# Patient Record
Sex: Female | Born: 1935 | Race: White | Hispanic: No | Marital: Married | Smoking: Former smoker
Health system: Southern US, Community
[De-identification: ages and names within clinical notes are randomized; demographics above are authoritative.]

## PROBLEM LIST (undated history)

## (undated) DIAGNOSIS — K5790 Diverticulosis of intestine, part unspecified, without perforation or abscess without bleeding: Secondary | ICD-10-CM

## (undated) DIAGNOSIS — E785 Hyperlipidemia, unspecified: Secondary | ICD-10-CM

## (undated) DIAGNOSIS — M1990 Unspecified osteoarthritis, unspecified site: Secondary | ICD-10-CM

## (undated) DIAGNOSIS — K2990 Gastroduodenitis, unspecified, without bleeding: Secondary | ICD-10-CM

## (undated) DIAGNOSIS — I4891 Unspecified atrial fibrillation: Secondary | ICD-10-CM

## (undated) DIAGNOSIS — E039 Hypothyroidism, unspecified: Secondary | ICD-10-CM

## (undated) HISTORY — PX: TOTAL KNEE ARTHROPLASTY: SHX125

## (undated) HISTORY — PX: KNEE SURGERY: SHX244

## (undated) HISTORY — DX: Diverticulosis of intestine, part unspecified, without perforation or abscess without bleeding: K57.90

## (undated) HISTORY — DX: Hypothyroidism, unspecified: E03.9

## (undated) HISTORY — DX: Gastroduodenitis, unspecified, without bleeding: K29.90

## (undated) HISTORY — DX: Hyperlipidemia, unspecified: E78.5

## (undated) HISTORY — DX: Unspecified atrial fibrillation: I48.91

## (undated) HISTORY — PX: ROTATOR CUFF REPAIR: SHX139

## (undated) HISTORY — DX: Unspecified osteoarthritis, unspecified site: M19.90

## (undated) HISTORY — PX: INTRAOCULAR LENS IMPLANT, SECONDARY: SHX1842

## (undated) HISTORY — PX: TONSILLECTOMY: SUR1361

---

## 1994-09-14 ENCOUNTER — Encounter: Payer: Self-pay | Admitting: Internal Medicine

## 1995-05-05 ENCOUNTER — Encounter: Payer: Self-pay | Admitting: Internal Medicine

## 1995-05-19 ENCOUNTER — Encounter: Payer: Self-pay | Admitting: Internal Medicine

## 2000-06-09 ENCOUNTER — Encounter: Payer: Self-pay | Admitting: Endocrinology

## 2000-06-09 ENCOUNTER — Encounter: Admission: RE | Admit: 2000-06-09 | Discharge: 2000-06-09 | Payer: Self-pay | Admitting: Endocrinology

## 2001-05-03 ENCOUNTER — Encounter: Admission: RE | Admit: 2001-05-03 | Discharge: 2001-05-03 | Payer: Self-pay | Admitting: Endocrinology

## 2001-05-03 ENCOUNTER — Encounter: Payer: Self-pay | Admitting: Endocrinology

## 2001-06-20 ENCOUNTER — Encounter: Admission: RE | Admit: 2001-06-20 | Discharge: 2001-06-20 | Payer: Self-pay | Admitting: Endocrinology

## 2001-06-20 ENCOUNTER — Encounter: Payer: Self-pay | Admitting: Endocrinology

## 2002-06-21 ENCOUNTER — Encounter: Payer: Self-pay | Admitting: Endocrinology

## 2002-06-21 ENCOUNTER — Encounter: Admission: RE | Admit: 2002-06-21 | Discharge: 2002-06-21 | Payer: Self-pay | Admitting: Endocrinology

## 2003-06-21 ENCOUNTER — Encounter: Admission: RE | Admit: 2003-06-21 | Discharge: 2003-06-21 | Payer: Self-pay | Admitting: Endocrinology

## 2004-07-21 ENCOUNTER — Encounter: Admission: RE | Admit: 2004-07-21 | Discharge: 2004-07-21 | Payer: Self-pay | Admitting: Endocrinology

## 2005-08-16 ENCOUNTER — Encounter: Admission: RE | Admit: 2005-08-16 | Discharge: 2005-08-16 | Payer: Self-pay | Admitting: Endocrinology

## 2005-10-07 ENCOUNTER — Emergency Department (HOSPITAL_COMMUNITY): Admission: EM | Admit: 2005-10-07 | Discharge: 2005-10-08 | Payer: Self-pay | Admitting: Emergency Medicine

## 2005-10-18 ENCOUNTER — Emergency Department (HOSPITAL_COMMUNITY): Admission: EM | Admit: 2005-10-18 | Discharge: 2005-10-18 | Payer: Self-pay | Admitting: Emergency Medicine

## 2006-09-05 ENCOUNTER — Encounter: Admission: RE | Admit: 2006-09-05 | Discharge: 2006-09-05 | Payer: Self-pay | Admitting: Endocrinology

## 2007-10-20 ENCOUNTER — Encounter: Admission: RE | Admit: 2007-10-20 | Discharge: 2007-10-20 | Payer: Self-pay | Admitting: Endocrinology

## 2008-01-23 ENCOUNTER — Inpatient Hospital Stay (HOSPITAL_COMMUNITY): Admission: RE | Admit: 2008-01-23 | Discharge: 2008-01-25 | Payer: Self-pay | Admitting: Orthopedic Surgery

## 2008-09-17 ENCOUNTER — Ambulatory Visit: Payer: Self-pay | Admitting: Ophthalmology

## 2008-09-23 ENCOUNTER — Ambulatory Visit: Payer: Self-pay | Admitting: Ophthalmology

## 2008-10-21 ENCOUNTER — Encounter: Admission: RE | Admit: 2008-10-21 | Discharge: 2008-10-21 | Payer: Self-pay | Admitting: Endocrinology

## 2008-12-11 ENCOUNTER — Encounter: Admission: RE | Admit: 2008-12-11 | Discharge: 2008-12-11 | Payer: Self-pay | Admitting: Endocrinology

## 2009-03-21 ENCOUNTER — Encounter: Admission: RE | Admit: 2009-03-21 | Discharge: 2009-03-21 | Payer: Self-pay | Admitting: Orthopaedic Surgery

## 2009-04-28 ENCOUNTER — Ambulatory Visit: Payer: Self-pay | Admitting: Internal Medicine

## 2009-04-28 DIAGNOSIS — E039 Hypothyroidism, unspecified: Secondary | ICD-10-CM | POA: Insufficient documentation

## 2009-04-28 DIAGNOSIS — I4891 Unspecified atrial fibrillation: Secondary | ICD-10-CM | POA: Insufficient documentation

## 2009-05-15 ENCOUNTER — Encounter: Admission: RE | Admit: 2009-05-15 | Discharge: 2009-05-15 | Payer: Self-pay | Admitting: Endocrinology

## 2009-05-29 ENCOUNTER — Encounter: Payer: Self-pay | Admitting: Internal Medicine

## 2009-07-07 ENCOUNTER — Ambulatory Visit: Payer: Self-pay | Admitting: Internal Medicine

## 2009-10-29 ENCOUNTER — Ambulatory Visit: Payer: Self-pay | Admitting: Internal Medicine

## 2009-10-30 ENCOUNTER — Encounter: Admission: RE | Admit: 2009-10-30 | Discharge: 2009-10-30 | Payer: Self-pay | Admitting: Endocrinology

## 2009-11-05 ENCOUNTER — Ambulatory Visit: Payer: Self-pay | Admitting: Internal Medicine

## 2009-11-06 ENCOUNTER — Encounter: Admission: RE | Admit: 2009-11-06 | Discharge: 2009-11-06 | Payer: Self-pay | Admitting: Endocrinology

## 2009-11-12 ENCOUNTER — Ambulatory Visit: Payer: Self-pay | Admitting: Internal Medicine

## 2009-11-19 ENCOUNTER — Ambulatory Visit: Payer: Self-pay | Admitting: Internal Medicine

## 2009-11-26 ENCOUNTER — Ambulatory Visit: Payer: Self-pay | Admitting: Internal Medicine

## 2009-12-03 ENCOUNTER — Ambulatory Visit: Payer: Self-pay | Admitting: Internal Medicine

## 2009-12-10 ENCOUNTER — Ambulatory Visit: Payer: Self-pay | Admitting: Internal Medicine

## 2009-12-17 ENCOUNTER — Ambulatory Visit: Payer: Self-pay | Admitting: Internal Medicine

## 2009-12-24 ENCOUNTER — Ambulatory Visit: Payer: Self-pay | Admitting: Internal Medicine

## 2009-12-31 ENCOUNTER — Ambulatory Visit: Payer: Self-pay | Admitting: Internal Medicine

## 2010-01-07 ENCOUNTER — Ambulatory Visit: Payer: Self-pay | Admitting: Internal Medicine

## 2010-01-14 ENCOUNTER — Ambulatory Visit: Payer: Self-pay | Admitting: Internal Medicine

## 2010-01-21 ENCOUNTER — Ambulatory Visit: Payer: Self-pay | Admitting: Internal Medicine

## 2010-01-28 ENCOUNTER — Ambulatory Visit: Payer: Self-pay | Admitting: Internal Medicine

## 2010-02-04 ENCOUNTER — Ambulatory Visit: Payer: Self-pay | Admitting: Internal Medicine

## 2010-02-10 ENCOUNTER — Ambulatory Visit: Payer: Self-pay | Admitting: Internal Medicine

## 2010-02-24 ENCOUNTER — Encounter: Admission: RE | Admit: 2010-02-24 | Discharge: 2010-02-24 | Payer: Self-pay | Admitting: Endocrinology

## 2010-03-16 ENCOUNTER — Inpatient Hospital Stay (HOSPITAL_COMMUNITY): Admission: RE | Admit: 2010-03-16 | Discharge: 2010-03-19 | Payer: Self-pay | Admitting: Orthopedic Surgery

## 2010-03-20 ENCOUNTER — Encounter: Payer: Self-pay | Admitting: Internal Medicine

## 2010-03-24 ENCOUNTER — Ambulatory Visit: Payer: Self-pay | Admitting: Internal Medicine

## 2010-07-28 LAB — URINALYSIS, ROUTINE W REFLEX MICROSCOPIC
Nitrite: NEGATIVE
Specific Gravity, Urine: 1.012 (ref 1.005–1.030)
Urine Glucose, Fasting: NEGATIVE mg/dL
pH: 5.5 (ref 5.0–8.0)

## 2010-07-28 LAB — CBC
MCH: 31.6 pg (ref 26.0–34.0)
MCHC: 35.6 g/dL (ref 30.0–36.0)
Platelets: 247 10*3/uL (ref 150–400)
RBC: 4.43 MIL/uL (ref 3.87–5.11)
RDW: 15.1 % (ref 11.5–15.5)
WBC: 6.3 10*3/uL (ref 4.0–10.5)

## 2010-07-28 LAB — BASIC METABOLIC PANEL
Calcium: 10.2 mg/dL (ref 8.4–10.5)
Chloride: 102 mEq/L (ref 96–112)
GFR calc non Af Amer: 46 mL/min — ABNORMAL LOW (ref >60)
Sodium: 140 mEq/L (ref 135–145)

## 2010-07-28 LAB — DIFFERENTIAL
Lymphocytes Relative: 29 % (ref 12–46)
Monocytes Absolute: 0.7 10*3/uL (ref 0.1–1.0)

## 2010-07-28 LAB — APTT: aPTT: 33 seconds (ref 24–37)

## 2010-07-28 LAB — PROTIME-INR: Prothrombin Time: 18.7 seconds — ABNORMAL HIGH (ref 11.6–15.2)

## 2010-07-28 LAB — SURGICAL PCR SCREEN: MRSA, PCR: NEGATIVE

## 2010-07-31 ENCOUNTER — Telehealth: Payer: Self-pay | Admitting: Internal Medicine

## 2010-08-04 ENCOUNTER — Inpatient Hospital Stay (HOSPITAL_COMMUNITY)
Admission: RE | Admit: 2010-08-04 | Discharge: 2010-08-07 | DRG: 467 | Disposition: A | Discharge status: Skilled Nursing Facility | Payer: Medicare Other | Attending: Orthopedic Surgery | Admitting: Orthopedic Surgery

## 2010-08-04 DIAGNOSIS — T84099A Other mechanical complication of unspecified internal joint prosthesis, initial encounter: Principal | ICD-10-CM | POA: Diagnosis present

## 2010-08-04 DIAGNOSIS — Z96659 Presence of unspecified artificial knee joint: Secondary | ICD-10-CM

## 2010-08-04 DIAGNOSIS — D62 Acute posthemorrhagic anemia: Secondary | ICD-10-CM | POA: Diagnosis not present

## 2010-08-04 DIAGNOSIS — Y831 Surgical operation with implant of artificial internal device as the cause of abnormal reaction of the patient, or of later complication, without mention of misadventure at the time of the procedure: Secondary | ICD-10-CM | POA: Diagnosis present

## 2010-08-04 DIAGNOSIS — I4891 Unspecified atrial fibrillation: Secondary | ICD-10-CM | POA: Diagnosis present

## 2010-08-04 DIAGNOSIS — M1710 Unilateral primary osteoarthritis, unspecified knee: Secondary | ICD-10-CM | POA: Diagnosis present

## 2010-08-04 DIAGNOSIS — Z7901 Long term (current) use of anticoagulants: Secondary | ICD-10-CM

## 2010-08-04 LAB — TYPE AND SCREEN
ABO/RH(D): O POS
Antibody Screen: NEGATIVE

## 2010-08-04 LAB — PROTIME-INR
INR: 0.97 (ref 0.00–1.49)
Prothrombin Time: 13.1 s (ref 11.6–15.2)

## 2010-08-04 LAB — APTT: aPTT: 26 s (ref 24–37)

## 2010-08-04 NOTE — Assessment & Plan Note (Signed)
Summary: f97m   Visit Type:  Follow-up Primary Provider:  Therisa Doyne, MD  CC:  no sob, no cp, and swelling in legs.  History of Present Illness: [name] is seen in followup for atrial fibrillation which is permanent.The patient denies SOB, chest pain, edema or palpitations    she comes in today to discuss going on Pradaxa.  She has no history of gastroesophageal reflux or ulcer disease.  Current Problems (verified): 1)  Unspecified Hypothyroidism  (ICD-244.9) 2)  Atrial Fibrillation  (ICD-427.31)  Current Medications (verified): 1)  Crestor 10 Mg Tabs (Rosuvastatin Calcium) .... Take One Tablet By Mouth Daily. 2)  Mobic 7.5 Mg Tabs (Meloxicam) .Marland Kitchen.. 1 By Mouth Once Daily 3)  Atenolol 25 Mg Tabs (Atenolol) .... Take One Tablet By Mouth Daily 4)  Coumadin 5 Mg Tabs (Warfarin Sodium) .Marland Kitchen.. 1 By Mouth Once Daily As Directed By Clinic - Dr. Dagoberto Ligas 5)  Synthroid 75 Mcg Tabs (Levothyroxine Sodium) .Marland Kitchen.. 1 By Mouth Once Daily 6)  Biotin 5000 5 Mg Caps (Biotin) .Marland Kitchen.. 1 By Mouth Once Daily 7)  Fish Oil 1000 Mg Caps (Omega-3 Fatty Acids) .Marland Kitchen.. 1 By Mouth Once Daily 8)  Glucosamine-Chondroitin 750-600 Mg Tabs (Glucosamine-Chondroitin) .... 2 By Mouth Once Daily 9)  Calcium-Vitamin D 250-125 Mg-Unit Tabs (Calcium Carbonate-Vitamin D) .Marland Kitchen.. 1 By Mouth Once Daily 10)  Milk Thistle Xtra  Caps (Milk Thistle-Dand-Fennel-Licor) .Marland Kitchen.. 1 By Mouth Once Daily 11)  One-A-Day Extras Antioxidant  Caps (Multiple Vitamins-Minerals) .Marland Kitchen.. 1 By Mouth Once Daily 12)  Furosemide 40 Mg Tabs (Furosemide) .... Take One Tablet By Mouth Daily. 13)  Potassium Chloride Cr 10 Meq Cr-Caps (Potassium Chloride) .... Take One Tablet By Mouth Two Times A Day  Allergies (verified): 1)  ! Sulfa 2)  ! Pcn 3)  ! Adhesive Tape  Vital Signs:  Patient profile:   77 year old female Height:      68 inches Weight:      120.50 pounds BMI:     18.39 Pulse rate:   64 / minute Pulse rhythm:   regular BP sitting:   118 / 70   (left arm) Cuff size:   regular  Vitals Entered By: Mercer Pod (July 07, 2009 12:24 PM)  Physical Exam  General:  The patient was alert and oriented in no acute distress.Neck veins were flat, carotids were brisk. Lungs were clear. Heart sounds were irregular without murmurs or gallops. Abdomen was soft with active bowel sounds. There is no clubbing cyanosis or edema.    Impression & Recommendations:  Problem # 1:  ATRIAL FIBRILLATION (ICD-427.31) We spent 25-30 minutes discussing the role of Pradaxa as an alternative anticoagulant to Coumadin. She would like to make this change. She is aware of the potential issues related to GI side effects. She knows [name] personally and well and she will call him also for his input. We have given her samples today as well as the co-pay card. She is to stop her Coumadin tonight and begin her Pradaxa on Wednesday. She'll continue on her atenolol. The following medications were removed from the medication list:    Coumadin 5 Mg Tabs (Warfarin sodium) .Marland Kitchen... 1 by mouth once daily as directed by clinic - dr. Dagoberto Ligas Her updated medication list for this problem includes:    Atenolol 25 Mg Tabs (Atenolol) .Marland Kitchen... Take one tablet by mouth daily  Patient Instructions: 1)  Your physician recommends that you schedule a follow-up appointment in: 3 months 2)  Your physician has  recommended you make the following change in your medication: stop coumadin today, start pradaxa on Wed evening Prescriptions: PRADAXA 150 MG 1 tab by mouth twice daily  #60 x 11   Entered by:   Charlena Cross, RN, BSN   Authorized by:   Nathen May, MD, [facility]   Signed by:   Nathen May, MD, [facility] on 07/07/2009   Method used:   Historical   RxID:   [id number]

## 2010-08-04 NOTE — Letter (Signed)
Summary: [facility] Face Sheet  [facility] Face Sheet   Imported By: Beau Fanny 03/25/2010 11:06:19  _____________________________________________________________________  External Attachment:    Type:   Image     Comment:   External Document

## 2010-08-05 LAB — BASIC METABOLIC PANEL
CO2: 29 mEq/L (ref 19–32)
Calcium: 8.8 mg/dL (ref 8.4–10.5)
Chloride: 101 mEq/L (ref 96–112)
GFR calc Af Amer: >60 mL/min (ref >60)
Sodium: 137 mEq/L (ref 135–145)

## 2010-08-05 LAB — CBC
HCT: 29.2 % — ABNORMAL LOW (ref 36.0–46.0)
MCV: 91.5 fL (ref 78.0–100.0)
RBC: 3.19 MIL/uL — ABNORMAL LOW (ref 3.87–5.11)
WBC: 5.9 10*3/uL (ref 4.0–10.5)

## 2010-08-05 LAB — PROTIME-INR: INR: 0.99 (ref 0.00–1.49)

## 2010-08-06 LAB — BASIC METABOLIC PANEL
BUN: 14 mg/dL (ref 6–23)
CO2: 27 mEq/L (ref 19–32)
Chloride: 100 mEq/L (ref 96–112)
Creatinine, Ser: 0.92 mg/dL (ref 0.4–1.2)

## 2010-08-06 LAB — CBC
Hemoglobin: 8.7 g/dL — ABNORMAL LOW (ref 12.0–15.0)
MCH: 30.4 pg (ref 26.0–34.0)
RBC: 2.86 MIL/uL — ABNORMAL LOW (ref 3.87–5.11)
WBC: 4.5 10*3/uL (ref 4.0–10.5)

## 2010-08-06 LAB — URINALYSIS, ROUTINE W REFLEX MICROSCOPIC
Hgb urine dipstick: NEGATIVE
Ketones, ur: NEGATIVE mg/dL
Protein, ur: NEGATIVE mg/dL
Urine Glucose, Fasting: NEGATIVE mg/dL

## 2010-08-06 LAB — PROTIME-INR: Prothrombin Time: 15.5 seconds — ABNORMAL HIGH (ref 11.6–15.2)

## 2010-08-06 NOTE — Progress Notes (Signed)
Summary: pt wants you as her doctor  Phone Note Call from Patient Call back at Home Phone [phone]   Caller: Patient Call For: [name] MD Summary of Call: You were seeing pt while she was in rehab at  [facility].  Her doctor, Dr. Mariam Dollar is retiring and she is asking if you will take her on as a regular pt here.  She will be having a knee replacement next week and will then go back to rehab for awhile. Initial call taken by: Lowella Petties CMA, AAMA,  July 31, 2010 9:46 AM  Follow-up for Phone Call        that is fine I have seen her so technically she is not even a new patient  Okay to set up appt Follow-up by: [name] MD,  July 31, 2010 10:08 AM  Additional Follow-up for Phone Call Additional follow up Details #1::        Advised pt, she will come by office to sign a release form for [place] to send to her current doctor's office. Additional Follow-up by: Lowella Petties CMA, AAMA,  July 31, 2010 10:31 AM

## 2010-08-06 NOTE — Op Note (Signed)
[name], [name] NO.:  [id number]  MEDICAL RECORD NO.:  [id number]          PATIENT TYPE:  INP  LOCATION:  1618                         FACILITY:  [facility]  PHYSICIAN:  Madlyn Frankel. Charlann Boxer, M.D.  DATE OF BIRTH:  01-Jan-1927  DATE OF PROCEDURE:  08/04/2010 DATE OF DISCHARGE:                              OPERATIVE REPORT   PREOPERATIVE DIAGNOSIS:  History of a right partial lateral compartmental knee replacement with ultimate failure due to progression of degenerative change in the patellofemoral compartment predominantly.  POSTOPERATIVE DIAGNOSIS:  History of a right partial lateral compartmental knee replacement with ultimate failure due to progression of degenerative change in the patellofemoral compartment predominantly.  PROCEDURE:  Revision of the right partial knee replaced to a right total knee replacement.  COMPONENTS USED:  DePuy knee system, size 2.5 posterior stabilized femur, 3 tibial tray rotating platform and a size 10-mm insert posterior stabilized rotating platform with a 38 patellar button.  SURGEON:  Madlyn Frankel. Charlann Boxer, M.D.  ASSISTANT:  Nelia Shi. Webb Silversmith, RN.  ANESTHESIA:  Preoperative regional femoral nerve block plus a general anesthetic.  SPECIMENS:  None.  COMPLICATIONS:  None.  DRAINS:  One Hemovac.  TOURNIQUET TIME:  37 minutes at 250 mmHg.  BLOOD LOSS:  Minimal.  INDICATIONS FOR PROCEDURE:  [name] is an 77 year old patient of mine and she had been seen and evaluated for some time in the office. In 2009, she initially underwent a right partial lateral compartmental arthroplasty due to predominant valgus and degenerative changes.  She had done well with this for a while, then she presented to office at about 2 and 2-1/2 years out complaining of progressive degenerative changes.  She had had undergone a left total knee replacement, done very well with this.  Based on the results that she had in her left knee, she at  this point was ready to proceed with a right total knee revision.  We discussed the risks and benefits, the need for the surgery, the postoperative course and expectations in review of what she had with her left knee.  Consent was obtained for the benefit of pain relief.  PROCEDURE IN DETAIL:  The patient was brought to the operative room theater.  Once adequate anesthesia, preoperative antibiotics, Ancef administered, she was positioned supine with the right thigh tourniquet placed.  The right lower extremity was then prepped and draped in a sterile fashion with the right leg in the [facility] leg holder.  A time-out was performed identifying the patient, planned procedure, and the extremity.  Leg was exsanguinated, tourniquet elevated to 250 mmHg.  A midline incision was made followed by median arthrotomy.  Following initial exposure and debridement, attention was first directed to patella.  Precut measurement was approximately 22 to 23 mm.  I resected down to 13 to 14 mm used a 38 patellar button to restore height and cover the cut surface.  Lug holes were drilled and a metal shim placed to protect the cut surface from retractors and saw blades.  Attention was now directed to femur.  The lateral compartment arthroplasty was noted be stable without evidence of any  loosening.  The patellofemoral joint had degenerative changes, worse on the lateral facet of the patella predominantly.  There was evidence of eburnated bone in the trochlear region.  Medial compartment remained intact with intact meniscus.  Femoral canal was opened and drilled, irrigated to try to prevent fat emboli.  An intramedullary rod was passed at 5 degrees of valgus.  I measured resection distally of 10 mm.  I initially resected the medial distal femur and then anterior part of the distal femur laterally and then used an osteotome and wedged off the femoral component.  This was removed with just a little bit of bone  loss around the central peg.  At this point, I finished up this lateral cut, freshening up with just a little bit of the cavitary defect in this lateral side distally.  The tibia was then subluxated anteriorly using extramedullary guide.  I chose to resect 4 mm of bone off the proximal medial tibia.  This cut seem to undermine the previously placed tibial tray other than the keeled portion of it.  I ran an oscillating saw on the medial portion of the knee and as far to the lateral components I could and then used an osteotome at this point and completed the cut and was able to elevate the component and keeled segment with minimal loss of bone, again it on the proximal lateral tibia.  I then made sure that the cut surface was cleaned and I confirmed that the cut was perpendicular in the coronal plane.  I then checked the extension gap and made certain a 10-mm block was going to be stable and extension was stable in medial and lateral collateral ligaments.  Once these 2 factors were done, I sized the femur to be a size 2.5 in an anterior-posterior direction.  The rotation block was pinned into position with anterior referenced off the proximal tibial cut.  The 4 in 1 cutting block was then placed and anterior-posterior chamfer cuts were then made without difficulty or notching.  The final box cut was made off the lateral aspect of distal femur.  At this point, the tibia was subluxated anteriorly.  The size 3 tibia tray fit best on the cut surface.  It was pinned into position, drilled and keeled punch.  A trial reduction now carried out with 2.5 femur, 3 tibia and a 10 mm insert.  The knee came to full extension.  The patella tracked through the trochlea without application of pressure.  At this point, trial components were removed from the synovial capsule junction.  The knee was injected with 0.25% Marcaine with epinephrine and 1 cc of Toradol, total 61 cc.  The knee was irrigated  with normal saline solution, pulse lavaged.  Final components were opened including the polyethylene insert.  Cement was mixed.  Final components were cemented on clean and dried cut surface and prepared cut surfaces of bone.  The knee was brought to extension with the 10 mm trial insert. Extruded cement was removed.  Once I was satisfied there was no remaining cement throughout the knee, the knee was irrigated the final 10-mm insert was inserted.  The knee was reirrigated with normal saline solution.  I placed a medium Hemovac drain.  We reapproximated extensor mechanism using #1 Vicryl with the knee in flexion.  The remaining wound was closed with 2-0 Vicryl and running 4-0 Monocryl.  Her skin is extremely thin and I was still able to get a 4-0 Monocryl in place and  Dermabond this.  She did have a couple of small skin tears at the removal of the sticky dressings.  This was all dermabonded together with Aquacel dressing. Care will need to be taken to remove this with use of mineral oil to elevate the edges.  We may very well have her come to the office to do this.  She was then placed into an Ace wrap, brought to recovery room in stable condition tolerating the procedure well.     Madlyn Frankel Charlann Boxer, M.D.     MDO/MEDQ  D:  08/04/2010  T:  08/05/2010  Job:  [id number]  Electronically Signed by Durene Romans M.D. on 08/06/2010 11:51:19 AM

## 2010-08-07 LAB — PROTIME-INR: INR: 1.37 (ref 0.00–1.49)

## 2010-08-08 LAB — URINE CULTURE
Colony Count: 80000
Culture  Setup Time: 201202022003
Special Requests: NEGATIVE

## 2010-08-11 DIAGNOSIS — I4891 Unspecified atrial fibrillation: Secondary | ICD-10-CM

## 2010-08-11 DIAGNOSIS — I998 Other disorder of circulatory system: Secondary | ICD-10-CM

## 2010-08-11 DIAGNOSIS — E039 Hypothyroidism, unspecified: Secondary | ICD-10-CM

## 2010-08-11 DIAGNOSIS — M1710 Unilateral primary osteoarthritis, unspecified knee: Secondary | ICD-10-CM

## 2010-08-16 NOTE — H&P (Signed)
  [name], [name]               ACCOUNT NO.:  [id number]  MEDICAL RECORD NO.:  [id number]          PATIENT TYPE:  INP  LOCATION:  NA                           FACILITY:  [facility]  PHYSICIAN:  Madlyn Frankel. Charlann Boxer, M.D.  DATE OF BIRTH:  [birth year]  DATE OF ADMISSION: DATE OF DISCHARGE:                             HISTORY & PHYSICAL   ADMISSION DIAGNOSIS:  Right knee arthroplasty failure.  BRIEF HISTORY:  This is a patient who has been followed here for sometime that underwent a left knee arthroplasty in September 2011, did well with that.  She has had a right knee that has become loosened and bothersome and she will have that revised on August 04, 2010.  PAST MEDICAL HISTORY:  She has upper and lower partial dentures.  She does have a history of Afib, generalized osteoarthritis, and some dermatologic problems with frail skin.  MEDICATION LIST: 1. Meloxicam 15 mg a day. 2. Atenolol 25 mg a day. 3. Synthroid 75 mcg a day. 4. Lasix 40 a day. 5. Crestor 10 a day. 6. Coumadin 6 a day. 7. Glucosamine 1500 mg a day. 8. Chondroitin 1200 mg a day, 9. Calcium and vitamin D daily. 10.Fish oil daily. 11.Multivitamins daily. 12.Biotin. 13.Vitamin B complex B12 daily.  ALLERGIES:  The patient has medication allergies to SULFA DRUGS, PENICILLIN, PRADAXA, AND SOME ADHESIVES OF DIFFERENT KINDS.  SOCIAL HISTORY:  She is married.  She is a retired [employer] fund[employer].  She drinks alcohol socially.  She has no history of illicit drugs.  She has distant history of tobacco use.  FAMILY HISTORY:  Her father died at 30 of pancreatic cancer.  Mother lived to 62 with Parkinson's.  She has no children.  REVIEW OF SYSTEMS:  Notable for those difficulties described in history of present illness and past medical history.  Her review of systems sheet is otherwise unremarkable.  PHYSICAL EXAMINATION:  VITAL SIGNS:  The patient is 5 feet 8 ounces, 125 pounds.  Blood pressure today was 130/80, respirations 20,  and pulse is 80. GENERAL:  Health is fair. HEENT:  She does wear glasses with upper and lower partials. NECK:  Within normal limits. CHEST:  Clear to auscultation bilaterally. HEART:  Has S1-S2.  No murmurs, rubs, or gallops. ABDOMEN:  Soft and nondistended. GU:  Unremarkable. EXTREMITIES:  Exam shows osteoarthritis and failed right knee. DERMATOLOGIC:  She has frail skin. NEUROLOGIC:  She is intact.  LABORATORY DATA:  Her labs, EKG, and chest x-ray are pending from [facility].  IMPRESSION:  Right knee failure, loosening of components.  PLAN:  She will be revised total right knee with Dr. Charlann Boxer on August 04, 2010.  Her discharge medications including Robaxin, iron, MiraLax, Colace, and Xarelto were given to her today.  Her pain medicines will be given to her at discharge.     Jacqueline L. Webb Silversmith, RN   ______________________________ Madlyn Frankel Charlann Boxer, M.D.    RLW/MEDQ  D:  07/30/2010  T:  07/30/2010  Job:  [id number]  Electronically Signed by Lauree Chandler NP-C on 08/12/2010 09:43:21 AM Electronically Signed by Durene Romans M.D. on 08/16/2010 09:15:35 AM

## 2010-08-16 NOTE — Discharge Summary (Signed)
  NAMEBAILIE, [name]  MEDICAL RECORD NO.:  [name]           PATIENT TYPE:  I  LOCATION:  1618                         FACILITY:  [facility]  PHYSICIAN:  Jacqueline Frankel. Charlann [name], M.D.  DATE OF BIRTH:  April 07, 1927  DATE OF ADMISSION:  08/04/2010 DATE OF DISCHARGE:  08/07/2010                              DISCHARGE SUMMARY   ADMITTING DIAGNOSIS:  Right knee arthroplasty failure.  BRIEF HISTORY:  This patient has been followed for sometime, underwent a left knee arthroplasty in September and did well.  She has had her right knee replaced with a lateral partial knee replacement with progression of  arthritis that had become bothersome and had that revised on the 31st.  PAST MEDICAL HISTORY:  Significant for history of AFib.  She does have dentures, generalized osteoarthritis, dermatologic problems with frail skin.  HOSPITAL COURSE:  She was admitted through same-day surgery on January 31, taken to the OR, and underwent the revision of the right knee without any difficulties.  She was taken to PACU for recovery and brought to [facility] for further recovery and rehabilitation.  Since that time, she has advanced her diet to regular.  She has been up with physical therapy and done well with that.  She will be discharged to SNF for further rehabilitation as of today.  She will follow up with Dr. Charlann [name] in 2 weeks.  Her discharge condition is good.  Her labs have been stable.  This morning, her INR was 1.37 and her hemoglobin was 8.5.  She is afebrile.  Vital signs are stable.  DISCHARGE MEDICATIONS: 1. Acetaminophen 325 mg every 4 hours as needed. 2. Colace 100 mg twice daily. 3. Ferrous sulfate 325 mg 3 times a day. 4. Robaxin 500 mg every 6 hours as needed. 5. Oxycodone 15 mg q.4-6 h. p.r.n. 6. MiraLax 17 g a day. 7. Atenolol 25 mg every morning. 8. Biotin 5000 mg every morning. 9. Calcium and vitamin D daily. 10.Coumadin 6 mg every evening according to  her INR. 11.Fish oil daily. 12.Furosemide 40 mg a day. 13.Glucosamine/chondroitin daily. 14.Meloxicam 15 mg a day. 15.__________. 16.Multivitamins daily.     Russell L. Webb Silversmith, RN   ______________________________ [name], M.D.    RLW/MEDQ  D:  08/07/2010  T:  08/07/2010  Job:  [id number]  Electronically Signed by Lauree Chandler NP-C on 08/12/2010 09:43:25 AM Electronically Signed by Durene Romans M.D. on 08/16/2010 09:15:29 AM

## 2010-08-31 DIAGNOSIS — M130 Polyarthritis, unspecified: Secondary | ICD-10-CM

## 2010-08-31 DIAGNOSIS — I4891 Unspecified atrial fibrillation: Secondary | ICD-10-CM

## 2010-08-31 DIAGNOSIS — E039 Hypothyroidism, unspecified: Secondary | ICD-10-CM

## 2010-08-31 DIAGNOSIS — I872 Venous insufficiency (chronic) (peripheral): Secondary | ICD-10-CM

## 2010-09-03 ENCOUNTER — Encounter: Payer: Self-pay | Admitting: Internal Medicine

## 2010-09-10 ENCOUNTER — Telehealth (INDEPENDENT_AMBULATORY_CARE_PROVIDER_SITE_OTHER): Payer: Self-pay | Admitting: *Deleted

## 2010-09-10 ENCOUNTER — Encounter: Payer: Self-pay | Admitting: Family Medicine

## 2010-09-10 ENCOUNTER — Ambulatory Visit (INDEPENDENT_AMBULATORY_CARE_PROVIDER_SITE_OTHER): Payer: Medicare Other

## 2010-09-10 DIAGNOSIS — Z5181 Encounter for therapeutic drug level monitoring: Secondary | ICD-10-CM

## 2010-09-10 DIAGNOSIS — E039 Hypothyroidism, unspecified: Secondary | ICD-10-CM

## 2010-09-10 DIAGNOSIS — I4891 Unspecified atrial fibrillation: Secondary | ICD-10-CM

## 2010-09-10 NOTE — Miscellaneous (Signed)
Summary: Medication update  Clinical Lists Changes  Medications: Added new medication of TYLENOL 325 MG TABS (ACETAMINOPHEN) take 2 by mouth every 4 hours as needed for pain or fever Added new medication of MILK OF MAGNESIA 7.75 % SUSP (MAGNESIUM HYDROXIDE) take 30cc by mouth two times a day as needed constipation Added new medication of COUMADIN 5 MG TABS (WARFARIN SODIUM) as directed Added new medication of COUMADIN 6 MG TABS (WARFARIN SODIUM) as directed Added new medication of VITAMIN B-12 1000 MCG TABS (CYANOCOBALAMIN) take 1 by mouth each morning Added new medication of MOBIC 15 MG TABS (MELOXICAM) take 1 by mouth once daily for pain Removed medication of MOBIC 7.5 MG TABS (MELOXICAM) 1 by mouth once daily Changed medication from GLUCOSAMINE-CHONDROITIN 750-600 MG TABS (GLUCOSAMINE-CHONDROITIN) 2 by mouth once daily to GLUCOSAMINE-CHONDROITIN 750-600 MG TABS (GLUCOSAMINE-CHONDROITIN) take 3 by mouth once daily in the morning Added new medication of FISH OIL 1200 MG CAPS (OMEGA-3 FATTY ACIDS) take 2 by mouth once daily Removed medication of FISH OIL 1000 MG CAPS (OMEGA-3 FATTY ACIDS) 1 by mouth once daily Added new medication of COLACE 100 MG CAPS (DOCUSATE SODIUM) take 1 by mouth two times a day Added new medication of B-100 COMPLEX  TABS (VITAMINS-LIPOTROPICS) take 1 by mouth at bedtime Added new medication of MULTIVITAMINS  TABS (MULTIPLE VITAMIN) take 1 by mouth once daily at bedtime Added new medication of CALCIUM CARBONATE-VITAMIN D 600-400 MG-UNIT TABS (CALCIUM CARBONATE-VITAMIN D) take 2 by mouth once daily at bedtime Added new medication of MIRALAX  POWD (POLYETHYLENE GLYCOL 3350) mix 17gm in 8oz of water take by mouth once daily as needed constipation Added new medication of HYDROCODONE-ACETAMINOPHEN 10-325 MG TABS (HYDROCODONE-ACETAMINOPHEN) take 1-2 by mouth every 4 hours as needed Removed medication of CRESTOR 10 MG TABS (ROSUVASTATIN CALCIUM) Take one tablet by mouth  daily. Added new medication of ROBAXIN 500 MG TABS (METHOCARBAMOL) take 1 by mouth every 6 hours as needed for muscle spasms Observations: Added new observation of FLU VAX: Historical (04/08/2010 10:28)      Influenza Immunization History:    Influenza # 1:  Historical (04/08/2010)

## 2010-09-10 NOTE — Letter (Signed)
Summary: [facility] Records   Imported By: Beau Fanny 09/03/2010 15:10:41  _____________________________________________________________________  External Attachment:    Type:   Image     Comment:   External Document

## 2010-09-11 ENCOUNTER — Encounter: Payer: Self-pay | Admitting: Nurse Practitioner

## 2010-09-11 ENCOUNTER — Encounter: Payer: Self-pay | Admitting: Cardiothoracic Surgery

## 2010-09-15 NOTE — Progress Notes (Signed)
----   Converted from flag ---- ---- 09/02/2010 7:54 AM, [name] MD wrote: [name]  She had protime Monday before leaving rehab Was 33.5 with INR of 3.29 so you have her most recent baseline She is coming in next week  Thanks Rich ------------------------------

## 2010-09-15 NOTE — Medication Information (Signed)
Summary: PROTIME PER DR LETVAK/CLE  Anticoagulant Therapy  PCP: Therisa Doyne, MD Indication 1: Atrial fibrillation PT 34.9 INR RANGE 2.0-3.0           Allergies: 1)  ! Sulfa 2)  ! Pcn 3)  ! Adhesive Tape  Anticoagulation Management History:      Positive risk factors for bleeding include an age of 56 years or older.  The bleeding index is 'intermediate risk'.  Positive CHADS2 values include Age > 75 years old.  Today's INR is 2.9.  Prothrombin time is 34.9.    Anticoagulation Management Assessment/Plan:      The patient's current anticoagulation dose is Coumadin 5 mg tabs: as directed, Coumadin 6 mg tabs: as directed.  The next INR is due 2 weeks.        Laboratory Results   Blood Tests   Date/Time Recieved: September 10, 2010 11:04 AM Date/Time Reported: September 10, 2010 11:04 AM  PT: 34.9 s   (Normal Range: 10.6-13.4)  INR: 2.9   (Normal Range: 0.88-1.12   Therap INR: 2.0-3.5)      ANTICOAGULATION RECORD  NEW REGIMEN & LAB RESULTS Anticoag. Dx: Atrial fibrillation Current INR Goal Range: 2.0-3.0 Current INR: 2.9 Current Coumadin Dose(mg): 6 mg dailly, 5 mg M/W/F Regimen: 6 mg daily, 5 mg M/W/F  Provider: letvak      Repeat testing in: 2 weeks MEDICATIONS ATENOLOL 25 MG TABS (ATENOLOL) Take one tablet by mouth daily SYNTHROID 75 MCG TABS (LEVOTHYROXINE SODIUM) 1 by mouth once daily BIOTIN 5000 5 MG CAPS (BIOTIN) 1 by mouth once daily GLUCOSAMINE-CHONDROITIN 750-600 MG TABS (GLUCOSAMINE-CHONDROITIN) take 3 by mouth once daily in the morning CALCIUM-VITAMIN D 250-125 MG-UNIT TABS (CALCIUM CARBONATE-VITAMIN D) 1 by mouth once daily MILK THISTLE XTRA  CAPS (MILK THISTLE-DAND-FENNEL-LICOR) 1 by mouth once daily ONE-A-DAY EXTRAS ANTIOXIDANT  CAPS (MULTIPLE VITAMINS-MINERALS) 1 by mouth once daily FUROSEMIDE 40 MG TABS (FUROSEMIDE) Take one tablet by mouth daily. POTASSIUM CHLORIDE CR 10 MEQ CR-CAPS (POTASSIUM CHLORIDE) Take one tablet by mouth two times a day *  PRADAXA 150 MG 1 tab by mouth twice daily TYLENOL 325 MG TABS (ACETAMINOPHEN) take 2 by mouth every 4 hours as needed for pain or fever MILK OF MAGNESIA 7.75 % SUSP (MAGNESIUM HYDROXIDE) take 30cc by mouth two times a day as needed constipation COUMADIN 5 MG TABS (WARFARIN SODIUM) as directed COUMADIN 6 MG TABS (WARFARIN SODIUM) as directed VITAMIN B-12 1000 MCG TABS (CYANOCOBALAMIN) take 1 by mouth each morning MOBIC 15 MG TABS (MELOXICAM) take 1 by mouth once daily for pain FISH OIL 1200 MG CAPS (OMEGA-3 FATTY ACIDS) take 2 by mouth once daily COLACE 100 MG CAPS (DOCUSATE SODIUM) take 1 by mouth two times a day B-100 COMPLEX  TABS (VITAMINS-LIPOTROPICS) take 1 by mouth at bedtime MULTIVITAMINS  TABS (MULTIPLE VITAMIN) take 1 by mouth once daily at bedtime CALCIUM CARBONATE-VITAMIN D 600-400 MG-UNIT TABS (CALCIUM CARBONATE-VITAMIN D) take 2 by mouth once daily at bedtime MIRALAX  POWD (POLYETHYLENE GLYCOL 3350) mix 17gm in 8oz of water take by mouth once daily as needed constipation HYDROCODONE-ACETAMINOPHEN 10-325 MG TABS (HYDROCODONE-ACETAMINOPHEN) take 1-2 by mouth every 4 hours as needed ROBAXIN 500 MG TABS (METHOCARBAMOL) take 1 by mouth every 6 hours as needed for muscle spasms  Dose has been reviewed with patient or caretaker during this visit.  Reviewed by: Celso Sickle  Anticoagulation Visit Questionnaire      Coumadin dose missed/changed:  No      Abnormal Bleeding Symptoms:  No Any diet changes including alcohol intake, vegetables or greens since the last visit:  No Any illnesses or hospitalizations since the last visit:  No Any signs of clotting since the last visit (including chest discomfort, dizziness, shortness of breath, arm tingling, slurred speech, swelling or redness in leg):  No

## 2010-09-17 LAB — APTT: aPTT: 33 seconds (ref 24–37)

## 2010-09-17 LAB — URINALYSIS, ROUTINE W REFLEX MICROSCOPIC
Ketones, ur: NEGATIVE mg/dL
Nitrite: NEGATIVE
Specific Gravity, Urine: 1.012 (ref 1.005–1.030)
pH: 5.5 (ref 5.0–8.0)

## 2010-09-17 LAB — BASIC METABOLIC PANEL
BUN: 38 mg/dL — ABNORMAL HIGH (ref 6–23)
CO2: 26 mEq/L (ref 19–32)
CO2: 29 mEq/L (ref 19–32)
CO2: 29 mEq/L (ref 19–32)
Calcium: 8.4 mg/dL (ref 8.4–10.5)
Calcium: 9.7 mg/dL (ref 8.4–10.5)
Chloride: 108 mEq/L (ref 96–112)
Chloride: 104 mEq/L (ref 96–112)
GFR calc Af Amer: 57 mL/min — ABNORMAL LOW (ref >60)
GFR calc Af Amer: 50 mL/min — ABNORMAL LOW (ref >60)
GFR calc Af Amer: >60 mL/min (ref >60)
GFR calc non Af Amer: 41 mL/min — ABNORMAL LOW (ref >60)
GFR calc non Af Amer: 42 mL/min — ABNORMAL LOW (ref >60)
Glucose, Bld: 107 mg/dL — ABNORMAL HIGH (ref 70–99)
Potassium: 4.9 mEq/L (ref 3.5–5.1)
Potassium: 5.1 mEq/L (ref 3.5–5.1)
Sodium: 136 mEq/L (ref 135–145)
Sodium: 135 mEq/L (ref 135–145)
Sodium: 136 mEq/L (ref 135–145)

## 2010-09-17 LAB — PROTIME-INR
INR: 1.36 (ref 0.00–1.49)
INR: 1.79 — ABNORMAL HIGH (ref 0.00–1.49)
Prothrombin Time: 13.8 seconds (ref 11.6–15.2)
Prothrombin Time: 15.4 seconds — ABNORMAL HIGH (ref 11.6–15.2)
Prothrombin Time: 21 seconds — ABNORMAL HIGH (ref 11.6–15.2)

## 2010-09-17 LAB — CBC
HCT: 38.9 % (ref 36.0–46.0)
Hemoglobin: 9.8 g/dL — ABNORMAL LOW (ref 12.0–15.0)
MCH: 32.8 pg (ref 26.0–34.0)
MCV: 94.1 fL (ref 78.0–100.0)
MCV: 94.5 fL (ref 78.0–100.0)
Platelets: 126 10*3/uL — ABNORMAL LOW (ref 150–400)
Platelets: 189 10*3/uL (ref 150–400)
RBC: 2.98 MIL/uL — ABNORMAL LOW (ref 3.87–5.11)
RBC: 3 MIL/uL — ABNORMAL LOW (ref 3.87–5.11)
RDW: 14.7 % (ref 11.5–15.5)
RDW: 15.6 % — ABNORMAL HIGH (ref 11.5–15.5)
WBC: 3 10*3/uL — ABNORMAL LOW (ref 4.0–10.5)
WBC: 4.4 10*3/uL (ref 4.0–10.5)
WBC: 6.7 10*3/uL (ref 4.0–10.5)

## 2010-09-17 LAB — DIFFERENTIAL
Basophils Absolute: 0 10*3/uL (ref 0.0–0.1)
Lymphocytes Relative: 24 % (ref 12–46)
Neutro Abs: 4.4 10*3/uL (ref 1.7–7.7)
Neutrophils Relative %: 66 % (ref 43–77)

## 2010-09-17 LAB — SURGICAL PCR SCREEN: Staphylococcus aureus: NEGATIVE

## 2010-09-23 ENCOUNTER — Other Ambulatory Visit: Payer: Self-pay | Admitting: Internal Medicine

## 2010-09-23 ENCOUNTER — Telehealth: Payer: Self-pay | Admitting: Internal Medicine

## 2010-09-23 MED ORDER — ATENOLOL 25 MG PO TABS: 25.0000 mg | ORAL_TABLET | Freq: Every day | ORAL | Status: DC

## 2010-09-23 MED ORDER — WARFARIN SODIUM 6 MG PO TABS: 6.0000 mg | ORAL_TABLET | ORAL | Status: DC

## 2010-09-23 NOTE — Telephone Encounter (Signed)
[name],can you handle this?

## 2010-09-23 NOTE — Telephone Encounter (Signed)
rx sent to pharmacy, left message on patient cell phone that rx was called in to pharmacy.

## 2010-09-23 NOTE — Telephone Encounter (Signed)
Addended by: Mervin Hack on: 09/23/2010 04:26 PM   Modules accepted: Orders

## 2010-09-23 NOTE — Telephone Encounter (Signed)
Okay to change to generic Let her know that almost all my patients use the generic I would recommend checking protime again within 2 weeks of changing over

## 2010-09-24 ENCOUNTER — Other Ambulatory Visit: Payer: Self-pay | Admitting: *Deleted

## 2010-09-24 ENCOUNTER — Ambulatory Visit (INDEPENDENT_AMBULATORY_CARE_PROVIDER_SITE_OTHER): Payer: Medicare Other | Admitting: Internal Medicine

## 2010-09-24 ENCOUNTER — Telehealth (INDEPENDENT_AMBULATORY_CARE_PROVIDER_SITE_OTHER): Payer: Medicare Other | Admitting: Radiology

## 2010-09-24 DIAGNOSIS — I4891 Unspecified atrial fibrillation: Secondary | ICD-10-CM

## 2010-09-24 DIAGNOSIS — Z5181 Encounter for therapeutic drug level monitoring: Secondary | ICD-10-CM

## 2010-09-24 DIAGNOSIS — Z7901 Long term (current) use of anticoagulants: Secondary | ICD-10-CM

## 2010-09-24 LAB — POCT INR: INR: 1.3

## 2010-09-24 MED ORDER — ATENOLOL 25 MG PO TABS: 25.0000 mg | ORAL_TABLET | Freq: Every day | ORAL | Status: DC

## 2010-09-24 NOTE — Patient Instructions (Signed)
6 mg qd check in 1 week

## 2010-09-24 NOTE — Telephone Encounter (Signed)
Order for INR

## 2010-09-25 MED ORDER — MELOXICAM 15 MG PO TABS: 15.0000 mg | ORAL_TABLET | Freq: Every day | ORAL | Status: DC

## 2010-09-25 NOTE — Telephone Encounter (Signed)
Okay to send #[phone] x 11 Can be done electronically

## 2010-09-28 ENCOUNTER — Encounter: Payer: Self-pay | Admitting: Internal Medicine

## 2010-09-28 ENCOUNTER — Encounter: Payer: Medicare Other | Admitting: Internal Medicine

## 2010-09-30 ENCOUNTER — Ambulatory Visit (INDEPENDENT_AMBULATORY_CARE_PROVIDER_SITE_OTHER): Payer: Medicare Other | Admitting: Internal Medicine

## 2010-09-30 ENCOUNTER — Encounter: Payer: Self-pay | Admitting: Internal Medicine

## 2010-09-30 VITALS — BP 155/84 | HR 73 | Temp 97.6°F | Ht 68.0 in | Wt 123.0 lb

## 2010-09-30 DIAGNOSIS — Z7901 Long term (current) use of anticoagulants: Secondary | ICD-10-CM

## 2010-09-30 DIAGNOSIS — M159 Polyosteoarthritis, unspecified: Secondary | ICD-10-CM | POA: Insufficient documentation

## 2010-09-30 DIAGNOSIS — E039 Hypothyroidism, unspecified: Secondary | ICD-10-CM

## 2010-09-30 DIAGNOSIS — I4891 Unspecified atrial fibrillation: Secondary | ICD-10-CM

## 2010-09-30 DIAGNOSIS — Z5181 Encounter for therapeutic drug level monitoring: Secondary | ICD-10-CM

## 2010-09-30 DIAGNOSIS — E785 Hyperlipidemia, unspecified: Secondary | ICD-10-CM

## 2010-09-30 NOTE — Progress Notes (Signed)
  Subjective:    Patient ID: [name], female    DOB: 1927/06/22, 77 y.o.   MRN: [id number]  HPI I have taken care of her in [facility] when she was there for rehab after knee surgery Had both knees replaced within 6 months Still feels a little wobbly but has progressed with strength training Stamina is still not back to normal  Continues on coumadin Had bad reaction to pradaxa  Lots of stress dealing with husbnad He has had major health issues but doesn't always accept limitations No palpitations BP generally much lower than this---stressed about dealing with taxes with husband  Some joint pain Uses hydrocodone prn or left over percocet occ Still takes the meloxicam regularly  On the thyroid replacement  Off crestor now Lipids when off were okay  Past Medical History  Diagnosis Date  . Arthritis   . Hyperlipidemia   . Thyroid disease   . Atrial fibrillation     Past Surgical History  Procedure Date  . Rotator cuff repair   . Intraocular lens implant, secondary 1996/ 2010  . Knee surgery     lateral  . Tonsillectomy     Family History  Problem Relation Age of Onset  . Heart disease Mother     History   Social History  . Marital Status: Married    Spouse Name: N/A    Number of Children: N/A  . Years of Education: N/A   Occupational History  . retired    Social History Main Topics  . Smoking status: Former Smoker    Types: Cigarettes  . Smokeless tobacco: Not on file  . Alcohol Use: Yes  . Drug Use: No  . Sexually Active: Not on file   Other Topics Concern  . Not on file   Social History Narrative   Regular exercise      Review of Systems Appetite is okay Weight is stable Sleeps okay     Objective:   Physical Exam  Constitutional: She appears well-developed and well-nourished. No distress.  Neck: Normal range of motion. Neck supple. No thyromegaly present.  Cardiovascular: Normal rate, normal heart sounds and intact distal  pulses.  Exam reveals no gallop.   No murmur heard.      Irregular rhythm  Pulmonary/Chest: Effort normal and breath sounds normal. No respiratory distress. She has no wheezes. She has no rales.  Musculoskeletal: She exhibits no edema and no tenderness.       No sig knee thickening  Lymphadenopathy:    She has no cervical adenopathy.  Psychiatric: She has a normal mood and affect. Judgment and thought content normal.          Assessment & Plan:

## 2010-10-07 ENCOUNTER — Ambulatory Visit (INDEPENDENT_AMBULATORY_CARE_PROVIDER_SITE_OTHER): Payer: Medicare Other | Admitting: Internal Medicine

## 2010-10-07 DIAGNOSIS — Z7901 Long term (current) use of anticoagulants: Secondary | ICD-10-CM

## 2010-10-07 DIAGNOSIS — I4891 Unspecified atrial fibrillation: Secondary | ICD-10-CM

## 2010-10-07 DIAGNOSIS — Z5181 Encounter for therapeutic drug level monitoring: Secondary | ICD-10-CM

## 2010-10-07 NOTE — Patient Instructions (Signed)
Continue current dose, check in 1weeks  

## 2010-10-07 NOTE — Progress Notes (Signed)
Needs follow up appt date before i can sign

## 2010-10-14 ENCOUNTER — Ambulatory Visit (INDEPENDENT_AMBULATORY_CARE_PROVIDER_SITE_OTHER): Payer: Medicare Other | Admitting: Internal Medicine

## 2010-10-14 DIAGNOSIS — I4891 Unspecified atrial fibrillation: Secondary | ICD-10-CM

## 2010-10-14 DIAGNOSIS — Z7901 Long term (current) use of anticoagulants: Secondary | ICD-10-CM

## 2010-10-14 DIAGNOSIS — Z5181 Encounter for therapeutic drug level monitoring: Secondary | ICD-10-CM

## 2010-10-14 NOTE — Patient Instructions (Signed)
6 mg daily, 7.5mg  MWF, recheck in 2 weeks

## 2010-10-15 ENCOUNTER — Other Ambulatory Visit: Payer: Self-pay | Admitting: *Deleted

## 2010-10-15 MED ORDER — LEVOTHYROXINE SODIUM 75 MCG PO TABS: ORAL_TABLET | ORAL | Status: DC

## 2010-10-16 ENCOUNTER — Telehealth: Payer: Self-pay | Admitting: *Deleted

## 2010-10-16 NOTE — Telephone Encounter (Signed)
Patient's husband calling asking that Dr.Letvak fill out a form or write on a script pad that it's ok for pt to exercise at [facility] forever fit program. Patient had knee surgery about ago and Dr.Letvak seen pt while at rehab at [facility], then pt came to establish care with Dr.Letvak here at [facility]. Per Dr.Letvak he would need the form to sign, he needs to know what kind of exercise. I spoke with patient's husband and he stated the form is at [facility] rehab dept and all the pt needs is something written stating ok to exercise. I called [facility] rehab dept and left a voicemail asking for someone to return my call, I'm requesting the form so Dr.Letvak may sign.

## 2010-10-19 NOTE — Telephone Encounter (Signed)
Form faxed to [facility], scanned and copied.

## 2010-10-19 NOTE — Telephone Encounter (Signed)
Note written Fax over and see if that will suffice

## 2010-10-21 ENCOUNTER — Encounter: Payer: Self-pay | Admitting: Internal Medicine

## 2010-10-26 ENCOUNTER — Ambulatory Visit (INDEPENDENT_AMBULATORY_CARE_PROVIDER_SITE_OTHER): Payer: Medicare Other | Admitting: Internal Medicine

## 2010-10-26 DIAGNOSIS — Z7901 Long term (current) use of anticoagulants: Secondary | ICD-10-CM

## 2010-10-26 DIAGNOSIS — Z5181 Encounter for therapeutic drug level monitoring: Secondary | ICD-10-CM

## 2010-10-26 DIAGNOSIS — I4891 Unspecified atrial fibrillation: Secondary | ICD-10-CM

## 2010-10-26 LAB — POCT INR: INR: 1.7

## 2010-10-26 NOTE — Progress Notes (Signed)
Have her increase to 7.5mg  daily all except for Mon and Thurs --then 6mg  Recheck in 2 weeks

## 2010-10-26 NOTE — Patient Instructions (Addendum)
Advised patient that we will call her with instructions this afternoon.    Patient notified of instructions.    Appt for PT/INR scheduled for 11-09-10.

## 2010-10-27 NOTE — Progress Notes (Signed)
Needs INR recheck date please

## 2010-11-09 ENCOUNTER — Ambulatory Visit: Payer: Medicare Other

## 2010-11-10 ENCOUNTER — Ambulatory Visit (INDEPENDENT_AMBULATORY_CARE_PROVIDER_SITE_OTHER): Payer: Medicare Other | Admitting: Family Medicine

## 2010-11-10 ENCOUNTER — Telehealth: Payer: Self-pay | Admitting: *Deleted

## 2010-11-10 DIAGNOSIS — Z7901 Long term (current) use of anticoagulants: Secondary | ICD-10-CM

## 2010-11-10 DIAGNOSIS — I4891 Unspecified atrial fibrillation: Secondary | ICD-10-CM

## 2010-11-10 DIAGNOSIS — Z5181 Encounter for therapeutic drug level monitoring: Secondary | ICD-10-CM

## 2010-11-10 LAB — POCT INR: INR: 1.6

## 2010-11-10 NOTE — Telephone Encounter (Signed)
Pt is requesting a new script for omeprazole otc 20 mg's.  She has been getting this from Dr. Dagoberto Ligas.  I have information to obtain pt assistance for this medicine.  Script can be called to [phone], or written script can be faxed to [phone].  These numbers are for [facility].  Please return to me and I will take care of this.

## 2010-11-10 NOTE — Progress Notes (Signed)
INR 1.7 today.  Pt was hesitant to inc her dose.  I would continue as is for 1 more week and then adjust the dose as needed.  I d/w pt re: risk/benefit of this plan and she agreed/understood.  She'll likely need a refill on the 7.5mg  coumadin, but she doesn't need it yet.  We'll await the next INR and this can be addressed then.

## 2010-11-10 NOTE — Patient Instructions (Signed)
Patient is in office waiting for instructions.

## 2010-11-10 NOTE — Telephone Encounter (Signed)
Okay to approve for a year

## 2010-11-11 ENCOUNTER — Ambulatory Visit: Payer: Medicare Other

## 2010-11-11 MED ORDER — OMEPRAZOLE MAGNESIUM 20 MG PO TBEC: 20.0000 mg | DELAYED_RELEASE_TABLET | Freq: Every day | ORAL | Status: DC

## 2010-11-11 NOTE — Telephone Encounter (Signed)
Prilosec called to [facility] mail service pharmacy.

## 2010-11-17 ENCOUNTER — Ambulatory Visit (INDEPENDENT_AMBULATORY_CARE_PROVIDER_SITE_OTHER): Payer: Medicare Other | Admitting: Internal Medicine

## 2010-11-17 ENCOUNTER — Ambulatory Visit: Payer: Medicare Other

## 2010-11-17 DIAGNOSIS — Z7901 Long term (current) use of anticoagulants: Secondary | ICD-10-CM

## 2010-11-17 DIAGNOSIS — Z5181 Encounter for therapeutic drug level monitoring: Secondary | ICD-10-CM

## 2010-11-17 DIAGNOSIS — I4891 Unspecified atrial fibrillation: Secondary | ICD-10-CM

## 2010-11-17 NOTE — Op Note (Signed)
NAMESUNI, JARNAGIN NO.:  [id number]   MEDICAL RECORD NO.:  [id number]          PATIENT TYPE:  INP   LOCATION:  0009                         FACILITY:  [facility]   PHYSICIAN:  Madlyn Frankel. Charlann Boxer, M.D.  DATE OF BIRTH:  Mar 04, 1927   DATE OF PROCEDURE:  01/23/2008  DATE OF DISCHARGE:                               OPERATIVE REPORT   PREOPERATIVE DIAGNOSIS:  Right knee lateral compartment isolated  degenerative joint disease.   POSTOPERATIVE DIAGNOSIS:  Right knee lateral compartment isolated  degenerative joint disease.   OPERATION PERFORMED:  Right knee lateral compartment unicompartmental  knee replacement.   COMPONENTS USED:  Biomet Vanguard knee system, size small femur, a size  5 right lateral tibial component.   SURGEON:  Madlyn Frankel. Charlann Boxer, M.D.   ASSISTANT:  [name]. Mann, PA   ANESTHESIA:  General plus regional femoral block done preoperatively.   DRAINS:  x1.   TOURNIQUET TIME:  44 minutes at 250 mmHg.   ESTIMATED BLOOD LOSS:  50 mL.   INDICATIONS FOR PROCEDURE:  [name] is a 77 year old female who was  referred for surgical consideration of isolated bone-on-bone arthritis  with progressive symptoms.  She has failed arthroscopic surgery which  identified, isolated lateral compartment degenerative changes in the  knee.  Radiographs revealed these bone-on-bone changes.  The risks and  benefits were discussed including the options of both partial versus  total knee replacement.  I think given the options of partial knee  replacement with preservation of the knee ligaments and a more normal-  appearing knee, she wished to accept the risks of infection, DVT,  component, failure, need for revision to a total knee replacement.  Based on this, consent was obtained.   DESCRIPTION OF PROCEDURE:  The patient was brought to the operating  theater.  Once adequate anesthesia, preoperative antibiotics Ancef  administered, the patient was positioned supine.   A proximal thigh  tourniquet was placed.  The right lower extremity was then placed into  an Oxford allowing for flexion of the knee for exposure purposes.   The knee was subsequently prescrubbed and prepped and draped in sterile  fashion.  The leg was exsanguinated and the tourniquet elevated to 250  mmHg.   An incision was made slightly lateral to midline followed by a lateral  arthrotomy.   Minimal debridement was carried out per the technique.  Kocher clamp was  placed along the capsular tissues laterally to help retract and  retractor was placed.  I debrided some osteophytes off the lateral  femoral condyle distally.  There was no other significant osteophytes of  the anterolateral aspect of the knee.  The patellofemoral joint was  noted to be intact and normal.  The medial side looked normal as well.  At this point the extramedullary guide for the left medial knee was then  placed along the anterior aspect of the tibia with some slope taken out.  To make sure that the tibial cut was rotated correctly I made a small  stab incision in the patella tendon laterally for the reciprocating saw.  Following resection  I checked the thickness of my cut and  felt that the  size B component fit best from an anterior posterior dimension.   I placed the tray on the cut surface and then the recommended +5 feeler  gauge which I had two finger pressure upon sliding it back and forth.   This did tighten up when I went up to a size 7.   At this point attention was directed to preparation of the femur.  With  the size B tray. the size 3 feeler gauge and the guide for the posterior  femoral cut was positioned  assuring that the insert was along the rail  of the tray, that the alignment rod was in alignment with the femoral  shaft, parallel to that and perpendicular to the tibial cut.  Once I was  satisfied all the had been met, the drill holes were made.  The  posterior cutting guide was placed  and the posterior cut made.   At this point I placed a #0 spigot and milled out the distal femur.  I  checked my flexion versus extension gaps.  Based on this I chose to use  a 6 spigot in order to match the extension to the flexion.  The final 6  spigot was placed and distal femur milled.  Then I tested my extension  flexion gap.  They appeared to be balanced with a size 5 feeler gauge in  place.  I did remove some of the bone off the anterior aspect of the  distal femur and then I checked to make sure there was no impingement  with full extension.   At this point all trial components were removed.  I went on to final  preparation of the tibia.  The tibial tray was held in position with the  hook compoent holding the tray along the medial aspect of the cut  surface.  I then pinned it in position using reciprocating saw to cut  out the trough.   I then placed in the keeled trial component and the component sat nicely  in trial reduction __________  extension and flexion.  At this point the  trial components were removed.   Once the cement was ready, the final components were cemented into palce  first the tibia and then femur component.  Excessive cement was removed.  I placed a 2 feeler gauge and brought the knee to 45 degrees of flexion  to allow for compression of cement.  Once cement had cured, I removed  the remaining cement noting that it was difficult to see the posterior  aspect of the knee.   Then we irrigated the knee out.   Tourniquet was let down at 54 minutes at 250 mmHg.   At this point I placed a medium Hemovac drain into the suprapatellar  pouch.  The extensor mechanism was then reapproximated using a #1 Vicryl  running.  The remainder of the wound was closed with 2-0 Vicryls and  running 4-0 Monocryl.  The knee was cleaned, dressed sterilely with  Steri-Strips and a bulky sterile wrap.  She was brought to recovery  room, extubated in stable condition, having  tolerated the procedure  well.      Madlyn Frankel Charlann Boxer, M.D.  Electronically Signed     MDO/MEDQ  D:  01/23/2008  T:  01/23/2008  Job:  [id number]

## 2010-11-17 NOTE — H&P (Signed)
[name], [name] NO.:  [id number]   MEDICAL RECORD NO.:  [id number]         PATIENT TYPE:  LINP   LOCATION:                               FACILITY:  [facility]   PHYSICIAN:  Madlyn Frankel. Charlann Boxer, M.D.  DATE OF BIRTH:  10/06/1926   DATE OF ADMISSION:  01/23/2008  DATE OF DISCHARGE:                              HISTORY & PHYSICAL   ADDENDUM UNDER PLAN:  [name] recently had a fall with severe  hematoma on her left leg.  She has several open sores which she has seen  Dr. Charlann Boxer about.  She is going to have some Aquasol dressing which she  can change on her own in the hospital.  We previously discussed this.  She will bring in her bandages, and will allow patient to change these  on her own.  In addition will not put a TED hose on this left lower  extremity due to the abrasions and wounds.     ______________________________  [name] [name], [place]      Madlyn Frankel. Charlann Boxer, M.D.  Electronically Signed    BLM/MEDQ  D:  01/17/2008  T:  01/17/2008  Job:  [id number]

## 2010-11-17 NOTE — Patient Instructions (Signed)
Continue current dose, check in 4 weeks  

## 2010-11-17 NOTE — H&P (Signed)
NAMEENMA, MAEDA               ACCOUNT NO.:  [id number]   MEDICAL RECORD NO.:  [id number]          PATIENT TYPE:  INP   LOCATION:  NA                           [facility]ACILITY:  [facility]   PHYSICIAN:  Madlyn Frankel. Charlann Boxer, M.D.  DATE OF BIRTH:  1926-10-28   DATE OF ADMISSION:  01/23/2008  DATE OF DISCHARGE:                              HISTORY & PHYSICAL   PROCEDURE:  Will be a right partial knee replacement.   CHIEF COMPLAINTS:  Right medial knee pain.   HISTORY OF PRESENT ILLNESS:  A 77 year old female with a history of  right medial knee pain secondary to osteoarthritis.  This has been  refractory to all conservative treatment including oral anti-  inflammatories and cortisone injections.  She has had a diminished  quality of life and difficulty ambulating as a result.  She has been pre-  surgically assessed prior to surgery by Dr. Corrin Parker.   PAST MEDICAL HISTORY:  1. Significant for osteoarthritis.  2. Dyslipidemia.  3. Atrial fibrillation.  4. Hypothyroid.   PAST SURGICAL HISTORY:  Lens implant in the left eye in 1996, rotator  cuff surgery 1997, arthroscopic surgery right knee in January 2009.   FAMILY HISTORY:  Pancreatic cancer, Parkinson's disease.   SOCIAL HISTORY:  She is married, retired [employer] fundraiser.   PRIMARY CAREGIVER:  Will be her husband after surgery.   DRUG ALLERGIES:  SULFA DRUGS, PENICILLIN and MULTIPLE ADHESIVES.  This  includes PAPER TAPE left on for too long a period of time, may require  Mepilex dressing.   MEDICATIONS:  1. Crestor 10 mg p.o. daily.  2. Meloxicam 50 mg p.o. daily.  3. Synthroid 88 mcg daily.  4. Atenolol 2.5 mg p.o. daily.  5. Ogen 0.625 mg p.o. daily.  6. Coumadin 6 mg p.o. daily.  7. Provera 2.5 mg p.o. daily.  8. Maxzide 2.5 mg p.o. p.r.n.   REVIEW OF SYSTEMS:  None other than HPI.   PHYSICAL EXAMINATION:  VITAL SIGNS:  Pulse 72, respirations 16, blood  pressure 122/80.  Height 5 feet 8 inches, weight 134 pounds.  GENERAL:   Awake, alert and oriented, well-developed, well-nourished, no  acute distress.  NECK:  Supple.  No carotid bruits.  CHEST/LUNGS:  Clear to auscultation bilaterally.  BREASTS:  Deferred.  HEART:  Irregular rate and rhythm.  S1 and S2 are distinct.  ABDOMEN:  Soft, nontender, bowel sounds present.  GENITOURINARY:  Deferred.  EXTREMITIES:  Right knee does come out to full extension with anterior  knee pain.  Medial sided tenderness as well.  SKIN:  No cellulitis.  NEUROLOGIC:  Intact distal sensibilities.   LABS:  EKG, chest x-ray all pending presurgical testing.   IMPRESSION:  Right medial knee osteoarthritis.   PLAN OF ACTION:  Right unicondylar knee replacement on January 23, 2008 at  [facility] by surgeon Dr. Durene Romans.  Risks and  complications were discussed.   Postoperatively, the patient will require the use of Coumadin for DVT  prophylaxis.  She is planning to return home for rehab process.     ______________________________  [name] [name], [place]  Madlyn Frankel Charlann Boxer, M.D.  Electronically Signed    BLM/MEDQ  D:  01/17/2008  T:  01/17/2008  Job:  [id number]   cc:   Alfonse Alpers. Dagoberto Ligas, M.D.  Fax: [phone]

## 2010-11-19 ENCOUNTER — Other Ambulatory Visit: Payer: Self-pay | Admitting: *Deleted

## 2010-11-19 MED ORDER — WARFARIN SODIUM 7.5 MG PO TABS: ORAL_TABLET | ORAL | Status: DC

## 2010-11-20 NOTE — Discharge Summary (Signed)
NAMEKAYSI, OURADA               ACCOUNT NO.:  [id number]   MEDICAL RECORD NO.:  [id number]          PATIENT TYPE:  INP   LOCATION:  1607                         FACILITY:  [facility]   PHYSICIAN:  Madlyn Frankel. Charlann Boxer, M.D.  DATE OF BIRTH:  02-17-1927   DATE OF ADMISSION:  01/23/2008  DATE OF DISCHARGE:  01/25/2008                               DISCHARGE SUMMARY   ADMITTING DIAGNOSES:  1. Osteoarthritis.  2. Dyslipidemia.  3. Atrial fibrillation.  4. Hypothyroidism.   DISCHARGE DIAGNOSES:  1. Osteoarthritis.  2. Dyslipidemia.  3. Atrial fibrillation.  4. Hypothyroidism.   HISTORY OF PRESENT ILLNESS:  A 77 year old female with history of right  knee pain secondary to osteoarthritis refractory to all conservative  treatment including oral anti-inflammatories and cortisone ejection.  She did have a recent fall and had significant amount of bruising and  skin tearing on her contralateral leg.  No sign of infection.   CONSULTATION:  None.   PROCEDURE:  Right partial knee replacement of the lateral compartment.   SURGEON:  Dr. Durene Romans.   ASSISTANT:  Dwyane Luo, PA-C.   LABORATORY DATA ON ADMISSION:  Hemoglobin 12.2, hematocrit 36.2 and  platelets 24; at time of discharge, hemoglobin 9.4, hematocrit 26.7,  platelets 148.  White cell differential within normal limits.  Coagulation normal.  Routine chemistry upon admission all within normal  limits.  At time of discharge, sodium 134, potassium 3.6, glucose 98,  creatinine 1.02.  Upon admission, her kidney function showed a little  bit of volume depletion with her GFR being 52.  Her calcium is 8.4.  UA  was negative.   RADIOLOGY:  Chest two-view:  Cardiomegaly without acute disease.   CARDIOLOGY:  EKG showed atrial fibrillation.   HOSPITAL COURSE:  The patient underwent a right partial knee replacement  by surgeon Dr. Durene Romans, admitted to orthopedic floor.  She remained  afebrile throughout, hemodynamically stable.  Her  dressing was changed.  No significant drainage from the wound.  No sign of infection.  She was  neurovascular intact in this right lower extremity with improving quad  function.  She applied Aquacel to her left leg from previous abrasions.  Her right leg had a skin tear; we applied Aquacel on that as well.  She  met functional criteria with physical therapy weightbearing as tolerated  prior to discharge.   DISCHARGE DISPOSITION:  Discharged home in stable and improved  condition.   DISCHARGE DIET:  Regular.   DISCHARGE WOUND CARE:  Keep dry.   DISCHARGE PHYSICAL THERAPY:  Weightbearing as tolerated with a single  point cane.   DISCHARGE MEDICATIONS:  1. Coumadin 6 mg 1 p.o. daily.  2. Robaxin 500 mg p.o. q.6h. muscle spasm.  3. Vicodin 5/325 one to two p.o. q.4-6h. p.r.n. pain.  4. Colace 100 mg p.o. b.i.d.  5. MiraLax 17 grams p.o. daily.  6. Meloxicam 15 mg 1 p.o. q.a.m.  7. Atenolol 25 mg 1 p.o. q.a.m.  8. Synthroid 88 mcg 1 p.o. q.a.m.  9. Ogen 0.625 one daily.  10.Provera 2.5 mg 1 daily.  11.Crestor 10 mg 1  p.o. q.p.m.  12.Maxzide 25 mg p.o. p.r.n.  13.Multivitamin daily.  14.Calcium plus vitamin D daily.  15.Glucosamine chondroitin daily.  16.Milk thistle daily.  17.Biotin daily.   DISCHARGE FOLLOWUP:  Follow with Dr. Charlann Boxer at phone number [phone] in 2  weeks for wound check.     ______________________________  [name]. [name], [place]      Madlyn Frankel. Charlann Boxer, M.D.  Electronically Signed    BLM/MEDQ  D:  02/20/2008  T:  02/20/2008  Job:  [id number]   cc:   Alfonse Alpers. Dagoberto Ligas, M.D.  Fax: [phone]

## 2010-11-23 ENCOUNTER — Other Ambulatory Visit: Payer: Self-pay | Admitting: Internal Medicine

## 2010-11-23 DIAGNOSIS — Z1231 Encounter for screening mammogram for malignant neoplasm of breast: Secondary | ICD-10-CM

## 2010-11-27 ENCOUNTER — Ambulatory Visit
Admission: RE | Admit: 2010-11-27 | Discharge: 2010-11-27 | Disposition: A | Discharge status: Home / Self Care | Payer: Medicare Other | Source: Ambulatory Visit | Attending: Internal Medicine | Admitting: Internal Medicine

## 2010-11-27 DIAGNOSIS — Z1231 Encounter for screening mammogram for malignant neoplasm of breast: Secondary | ICD-10-CM

## 2010-12-02 ENCOUNTER — Encounter: Payer: Self-pay | Admitting: *Deleted

## 2010-12-15 ENCOUNTER — Ambulatory Visit (INDEPENDENT_AMBULATORY_CARE_PROVIDER_SITE_OTHER): Payer: Medicare Other | Admitting: Internal Medicine

## 2010-12-15 DIAGNOSIS — Z7901 Long term (current) use of anticoagulants: Secondary | ICD-10-CM

## 2010-12-15 DIAGNOSIS — I4891 Unspecified atrial fibrillation: Secondary | ICD-10-CM

## 2010-12-15 DIAGNOSIS — Z5181 Encounter for therapeutic drug level monitoring: Secondary | ICD-10-CM

## 2010-12-15 NOTE — Patient Instructions (Signed)
Continue current dose, check in 4 weeks  

## 2010-12-22 ENCOUNTER — Other Ambulatory Visit: Payer: Self-pay | Admitting: Orthopaedic Surgery

## 2010-12-22 DIAGNOSIS — M545 Low back pain: Secondary | ICD-10-CM

## 2010-12-25 ENCOUNTER — Other Ambulatory Visit: Payer: Self-pay | Admitting: *Deleted

## 2010-12-25 MED ORDER — FUROSEMIDE 40 MG PO TABS: ORAL_TABLET | ORAL | Status: DC

## 2010-12-25 MED ORDER — WARFARIN SODIUM 7.5 MG PO TABS: ORAL_TABLET | ORAL | Status: DC

## 2010-12-25 MED ORDER — MELOXICAM 15 MG PO TABS: 15.0000 mg | ORAL_TABLET | Freq: Every day | ORAL | Status: DC

## 2010-12-25 MED ORDER — LEVOTHYROXINE SODIUM 75 MCG PO TABS: ORAL_TABLET | ORAL | Status: DC

## 2010-12-25 MED ORDER — ATENOLOL 25 MG PO TABS: 25.0000 mg | ORAL_TABLET | Freq: Every day | ORAL | Status: DC

## 2010-12-25 NOTE — Telephone Encounter (Signed)
Patient is having an epideral in her back next wed. She is asking when she should start holding her coumadin. Please advise.

## 2010-12-25 NOTE — Telephone Encounter (Signed)
Generally it would be held for 3-5 days She should check with the person doing the procedure If any question, I would recommend 5 days to minimize the chance of bleeding

## 2010-12-25 NOTE — Telephone Encounter (Addendum)
Faxed request from [facility] is on your desk, they are asking for a 90 days supply.

## 2010-12-25 NOTE — Telephone Encounter (Signed)
Spoke with patient's husband and advised results.  

## 2010-12-26 ENCOUNTER — Ambulatory Visit
Admission: RE | Admit: 2010-12-26 | Discharge: 2010-12-26 | Disposition: A | Discharge status: Home / Self Care | Payer: Medicare Other | Source: Ambulatory Visit | Attending: Orthopaedic Surgery | Admitting: Orthopaedic Surgery

## 2010-12-26 DIAGNOSIS — M545 Low back pain: Secondary | ICD-10-CM

## 2010-12-28 ENCOUNTER — Telehealth: Payer: Self-pay | Admitting: *Deleted

## 2010-12-28 NOTE — Telephone Encounter (Signed)
Note faxed.

## 2010-12-28 NOTE — Telephone Encounter (Signed)
Patient is asking for a note to be faxed to Dr. Kathrynn Running office stating that you approve of her going off of the coumadin for 3-5 days before procedure. She hasn't taken any coumadin since last Thursday and her procedure is this weds. So she will have been off of it for 6 days. His fax number is [phone].

## 2010-12-28 NOTE — Telephone Encounter (Signed)
Note written

## 2010-12-31 ENCOUNTER — Other Ambulatory Visit: Payer: Self-pay | Admitting: Rehabilitation

## 2010-12-31 DIAGNOSIS — M25552 Pain in left hip: Secondary | ICD-10-CM

## 2010-12-31 DIAGNOSIS — R52 Pain, unspecified: Secondary | ICD-10-CM

## 2010-12-31 DIAGNOSIS — M545 Low back pain: Secondary | ICD-10-CM

## 2010-12-31 DIAGNOSIS — M2580 Other specified joint disorders, unspecified joint: Secondary | ICD-10-CM

## 2011-01-01 ENCOUNTER — Ambulatory Visit
Admission: RE | Admit: 2011-01-01 | Discharge: 2011-01-01 | Disposition: A | Discharge status: Home / Self Care | Payer: Medicare Other | Source: Ambulatory Visit | Attending: Rehabilitation | Admitting: Rehabilitation

## 2011-01-01 DIAGNOSIS — M545 Low back pain: Secondary | ICD-10-CM

## 2011-01-01 DIAGNOSIS — M25551 Pain in right hip: Secondary | ICD-10-CM

## 2011-01-01 DIAGNOSIS — M2580 Other specified joint disorders, unspecified joint: Secondary | ICD-10-CM

## 2011-01-05 ENCOUNTER — Encounter: Payer: Self-pay | Admitting: Nurse Practitioner

## 2011-01-05 ENCOUNTER — Encounter: Payer: Self-pay | Admitting: Cardiothoracic Surgery

## 2011-01-12 ENCOUNTER — Ambulatory Visit (INDEPENDENT_AMBULATORY_CARE_PROVIDER_SITE_OTHER): Payer: Medicare Other | Admitting: Family Medicine

## 2011-01-12 DIAGNOSIS — I4891 Unspecified atrial fibrillation: Secondary | ICD-10-CM

## 2011-01-12 DIAGNOSIS — Z5181 Encounter for therapeutic drug level monitoring: Secondary | ICD-10-CM

## 2011-01-12 DIAGNOSIS — Z7901 Long term (current) use of anticoagulants: Secondary | ICD-10-CM

## 2011-01-12 NOTE — Patient Instructions (Signed)
7.5 mg daily, except for Monday, Thursday take 6 mg(this Thursday take a 7.5 mg) recheck in 1 week

## 2011-01-18 ENCOUNTER — Ambulatory Visit (INDEPENDENT_AMBULATORY_CARE_PROVIDER_SITE_OTHER): Payer: Medicare Other | Admitting: Internal Medicine

## 2011-01-18 DIAGNOSIS — Z7901 Long term (current) use of anticoagulants: Secondary | ICD-10-CM

## 2011-01-18 DIAGNOSIS — Z5181 Encounter for therapeutic drug level monitoring: Secondary | ICD-10-CM

## 2011-01-18 DIAGNOSIS — I4891 Unspecified atrial fibrillation: Secondary | ICD-10-CM

## 2011-01-18 NOTE — Patient Instructions (Signed)
7.5 mg daily, recheck in 1 week.  

## 2011-01-25 ENCOUNTER — Ambulatory Visit (INDEPENDENT_AMBULATORY_CARE_PROVIDER_SITE_OTHER): Payer: Medicare Other | Admitting: Internal Medicine

## 2011-01-25 DIAGNOSIS — I4891 Unspecified atrial fibrillation: Secondary | ICD-10-CM

## 2011-01-25 DIAGNOSIS — Z7901 Long term (current) use of anticoagulants: Secondary | ICD-10-CM

## 2011-01-25 DIAGNOSIS — Z5181 Encounter for therapeutic drug level monitoring: Secondary | ICD-10-CM

## 2011-01-25 NOTE — Patient Instructions (Signed)
7.5 mg daily,recheck in 1 week(has only been on this dose 1 week)

## 2011-02-01 ENCOUNTER — Ambulatory Visit: Payer: Medicare Other

## 2011-02-01 ENCOUNTER — Telehealth: Payer: Self-pay | Admitting: *Deleted

## 2011-02-01 NOTE — Telephone Encounter (Signed)
Patient calling asking how long should she be off coumadin before her colonoscopy? I advised that the GI should let her know and per pt they said 7 days, per pt her colonoscopy hasn't been scheduled yet.

## 2011-02-02 ENCOUNTER — Ambulatory Visit (INDEPENDENT_AMBULATORY_CARE_PROVIDER_SITE_OTHER): Payer: Medicare Other | Admitting: Family Medicine

## 2011-02-02 DIAGNOSIS — Z7901 Long term (current) use of anticoagulants: Secondary | ICD-10-CM

## 2011-02-02 DIAGNOSIS — I4891 Unspecified atrial fibrillation: Secondary | ICD-10-CM

## 2011-02-02 DIAGNOSIS — Z5181 Encounter for therapeutic drug level monitoring: Secondary | ICD-10-CM

## 2011-02-02 LAB — POCT INR: INR: 1.6

## 2011-02-02 NOTE — Patient Instructions (Signed)
7.5 mg daily, Tues, Thurs take 8.5 mg, recheck in 2 weeks

## 2011-02-02 NOTE — Telephone Encounter (Signed)
Usually 5 days is enough but if they request 7 days that would be okay She will need to restart after the procedure and check about a week later

## 2011-02-03 ENCOUNTER — Encounter: Payer: Self-pay | Admitting: Cardiothoracic Surgery

## 2011-02-03 ENCOUNTER — Encounter: Payer: Self-pay | Admitting: Nurse Practitioner

## 2011-02-03 HISTORY — PX: COLONOSCOPY: SHX174

## 2011-02-03 NOTE — Telephone Encounter (Signed)
LMOVM to return call.

## 2011-02-10 NOTE — Telephone Encounter (Signed)
Spoke with patient and advised results, she may not have colonoscopy.

## 2011-02-15 ENCOUNTER — Ambulatory Visit (INDEPENDENT_AMBULATORY_CARE_PROVIDER_SITE_OTHER): Payer: Medicare Other | Admitting: Internal Medicine

## 2011-02-15 DIAGNOSIS — Z7901 Long term (current) use of anticoagulants: Secondary | ICD-10-CM

## 2011-02-15 DIAGNOSIS — I4891 Unspecified atrial fibrillation: Secondary | ICD-10-CM

## 2011-02-15 DIAGNOSIS — Z5181 Encounter for therapeutic drug level monitoring: Secondary | ICD-10-CM

## 2011-02-15 LAB — POCT INR: INR: 2

## 2011-02-15 NOTE — Patient Instructions (Signed)
Continue current dose, will have colonoscopy on 8-30, scheduled 2 weeks post for INR on 03-18-11.

## 2011-03-15 ENCOUNTER — Encounter: Payer: Self-pay | Admitting: Internal Medicine

## 2011-03-18 ENCOUNTER — Ambulatory Visit: Payer: Medicare Other

## 2011-03-18 ENCOUNTER — Ambulatory Visit (INDEPENDENT_AMBULATORY_CARE_PROVIDER_SITE_OTHER): Payer: Medicare Other | Admitting: Internal Medicine

## 2011-03-18 DIAGNOSIS — Z7901 Long term (current) use of anticoagulants: Secondary | ICD-10-CM

## 2011-03-18 DIAGNOSIS — Z5181 Encounter for therapeutic drug level monitoring: Secondary | ICD-10-CM

## 2011-03-18 DIAGNOSIS — I4891 Unspecified atrial fibrillation: Secondary | ICD-10-CM

## 2011-03-18 NOTE — Patient Instructions (Signed)
Continue 7.5 mg daily, Tues, Thurs take 8.5 mg recheck 4 weeks.

## 2011-03-22 ENCOUNTER — Ambulatory Visit: Payer: Medicare Other | Admitting: Internal Medicine

## 2011-03-24 ENCOUNTER — Ambulatory Visit: Payer: Medicare Other | Admitting: Internal Medicine

## 2011-03-30 ENCOUNTER — Ambulatory Visit (INDEPENDENT_AMBULATORY_CARE_PROVIDER_SITE_OTHER): Payer: Medicare Other | Admitting: Internal Medicine

## 2011-03-30 ENCOUNTER — Encounter: Payer: Self-pay | Admitting: Internal Medicine

## 2011-03-30 DIAGNOSIS — I4891 Unspecified atrial fibrillation: Secondary | ICD-10-CM

## 2011-03-30 DIAGNOSIS — E039 Hypothyroidism, unspecified: Secondary | ICD-10-CM

## 2011-03-30 DIAGNOSIS — E785 Hyperlipidemia, unspecified: Secondary | ICD-10-CM

## 2011-03-30 DIAGNOSIS — M159 Polyosteoarthritis, unspecified: Secondary | ICD-10-CM

## 2011-03-30 DIAGNOSIS — Z23 Encounter for immunization: Secondary | ICD-10-CM

## 2011-03-30 LAB — BASIC METABOLIC PANEL
BUN: 32 mg/dL — ABNORMAL HIGH (ref 6–23)
CO2: 29 mEq/L (ref 19–32)
Chloride: 100 mEq/L (ref 96–112)
Creatinine, Ser: 1.2 mg/dL (ref 0.4–1.2)

## 2011-03-30 LAB — HEPATIC FUNCTION PANEL
ALT: 21 U/L (ref 0–35)
Albumin: 4.6 g/dL (ref 3.5–5.2)
Total Bilirubin: 1.1 mg/dL (ref 0.3–1.2)
Total Protein: 7.9 g/dL (ref 6.0–8.3)

## 2011-03-30 LAB — LIPID PANEL
Cholesterol: 236 mg/dL — ABNORMAL HIGH (ref 0–200)
Triglycerides: 94 mg/dL (ref 0.0–149.0)

## 2011-03-30 LAB — CBC WITH DIFFERENTIAL/PLATELET
Basophils Relative: 0.5 % (ref 0.0–3.0)
Eosinophils Absolute: 0 10*3/uL (ref 0.0–0.7)
HCT: 39.4 % (ref 36.0–46.0)
Lymphs Abs: 0.8 10*3/uL (ref 0.7–4.0)
MCHC: 33.3 g/dL (ref 30.0–36.0)
MCV: 97.5 fl (ref 78.0–100.0)
Monocytes Absolute: 0.5 10*3/uL (ref 0.1–1.0)
Neutrophils Relative %: 76.2 % (ref 43.0–77.0)
Platelets: 232 10*3/uL (ref 150.0–400.0)
RBC: 4.04 Mil/uL (ref 3.87–5.11)

## 2011-03-30 MED ORDER — ZOSTER VACCINE LIVE 19400 UNT/0.65ML ~~LOC~~ SOLR: 0.6500 mL | Freq: Once | SUBCUTANEOUS | Status: DC

## 2011-03-30 NOTE — Assessment & Plan Note (Signed)
Good rate control No functional limitations On coumadin (bad reaction to pradaxa)

## 2011-03-30 NOTE — Assessment & Plan Note (Signed)
Clinically euthyroid Will check labs 

## 2011-03-30 NOTE — Assessment & Plan Note (Signed)
No coronary disease so stopped statin Will check levels off the med

## 2011-03-30 NOTE — Progress Notes (Signed)
Subjective:    Patient ID: [name], female    DOB: [birth year], 77 y.o.   MRN: [id number]  HPI DOing well Has been having some leg swelling---usually controlled with compression hose KNees are doing well post op Gets around well No sig pain in knees---occ takes something for back though Did have epidural steroid injection in back this summer  Doing cardiac exercise program with her husband No chest pain No SOB No palpitations  On same thyroid dose Due for labs  Off the statin No stomach trouble or apparent myalgias  Has cyst on labia She wants this checked  Current Outpatient Prescriptions on File Prior to Visit  Medication Sig Dispense Refill  . atenolol (TENORMIN) 25 MG tablet Take 1 tablet (25 mg total) by mouth daily.  90 tablet  1  . Biotin 5000 MCG CAPS Take by mouth daily.        . Calcium Carbonate-Vitamin D 600-400 MG-UNIT per tablet Take 2 tablets by mouth daily.        . furosemide (LASIX) 40 MG tablet Take one to two tablets by mouth every day  180 tablet  1  . Glucosamine-Chondroit-Calcium 750-600-100 MG TABS Take 2 By mouth once daily       . HYDROcodone-acetaminophen (NORCO) 10-325 MG per tablet Take 2 tablets by mouth every 4 (four) hours as needed.        Marland Kitchen levothyroxine (SYNTHROID, LEVOTHROID) 75 MCG tablet Generic ok.  90 tablet  1  . meloxicam (MOBIC) 15 MG tablet Take 1 tablet (15 mg total) by mouth daily.  90 tablet  3  . methocarbamol (ROBAXIN) 500 MG tablet Take 500 mg by mouth every 6 (six) hours as needed.        . milk thistle 175 MG tablet Take 175 mg by mouth daily.        . Multiple Vitamins-Minerals (ONE-A-DAY 50 PLUS PO) Take by mouth daily.        . Omega-3 Fatty Acids (FISH OIL) 1200 MG CAPS Take 2 By mouth once daily       . omeprazole (PRILOSEC OTC) 20 MG tablet Take 1 tablet (20 mg total) by mouth daily.  30 tablet  11  . polyethylene glycol (MIRALAX / GLYCOLAX) packet Take 17 g by mouth daily.        . potassium chloride (KLOR-CON)  10 MEQ CR tablet Take 10 mEq by mouth 2 (two) times daily.        . Thiamine HCl (VITAMIN B-1) 100 MG tablet Take 100 mg by mouth daily.        . vitamin B-12 (CYANOCOBALAMIN) 1000 MCG tablet Take 1,000 mcg by mouth daily.        Marland Kitchen warfarin (COUMADIN) 5 MG tablet Take 5 mg by mouth daily.        Marland Kitchen warfarin (COUMADIN) 6 MG tablet Take 6 mg by mouth as directed.        . warfarin (COUMADIN) 7.5 MG tablet Take by mouth daily except for Monday and Thursday.  90 tablet  1    Allergies  Allergen Reactions  . Pradaxa Shortness Of Breath  . Penicillins   . Sulfonamide Derivatives     Past Medical History  Diagnosis Date  . Arthritis   . Hyperlipidemia   . Thyroid disease   . Atrial fibrillation     Past Surgical History  Procedure Date  . Rotator cuff repair   . Intraocular lens implant, secondary 1996/ 2010  .  Knee surgery     lateral  . Tonsillectomy     Family History  Problem Relation Age of Onset  . Heart disease Mother     History   Social History  . Marital Status: Married    Spouse Name: N/A    Number of Children: N/A  . Years of Education: N/A   Occupational History  . retired    Social History Main Topics  . Smoking status: Former Smoker    Types: Cigarettes  . Smokeless tobacco: Not on file  . Alcohol Use: Yes  . Drug Use: No  . Sexually Active: Not on file   Other Topics Concern  . Not on file   Social History Narrative   Regular exercise   Review of Systems occ runny nose and slight cough and phlegm. Not sick Neti pot helps Sleeps well Appetite is good Weight i sup a few pounds---relates to fluid in legs    Objective:   Physical Exam  Constitutional: She appears well-developed and well-nourished. No distress.  Neck: Normal range of motion. Neck supple.  Cardiovascular: Normal rate, normal heart sounds and intact distal pulses.  Exam reveals no gallop.        irregular  Pulmonary/Chest: Effort normal and breath sounds normal. No  respiratory distress. She has no wheezes. She has no rales.  Genitourinary:       Slight right Bartholin's gland cyst No labial lesions or worrisome findings  Musculoskeletal: Normal range of motion. She exhibits no tenderness.       Trace pedal edema at most  Lymphadenopathy:    She has no cervical adenopathy.  Psychiatric: She has a normal mood and affect. Her behavior is normal. Judgment and thought content normal.          Assessment & Plan:

## 2011-03-30 NOTE — Assessment & Plan Note (Signed)
Doing great on new knees Still with some back pain

## 2011-03-31 LAB — LDL CHOLESTEROL, DIRECT: Direct LDL: 140.4 mg/dL

## 2011-04-01 LAB — DIFFERENTIAL
Basophils Absolute: 0
Lymphocytes Relative: 18
Lymphs Abs: 1.2
Neutrophils Relative %: 72

## 2011-04-01 LAB — URINALYSIS, ROUTINE W REFLEX MICROSCOPIC
Ketones, ur: NEGATIVE
Nitrite: NEGATIVE
Protein, ur: NEGATIVE
Urobilinogen, UA: 0.2
pH: 7

## 2011-04-01 LAB — CBC
Platelets: 243
RDW: 12.9
WBC: 6.3

## 2011-04-01 LAB — BASIC METABOLIC PANEL
BUN: 20
Calcium: 9.5
Creatinine, Ser: 1.11
GFR calc non Af Amer: 47 — ABNORMAL LOW
Glucose, Bld: 97

## 2011-04-02 ENCOUNTER — Encounter: Payer: Self-pay | Admitting: Cardiothoracic Surgery

## 2011-04-02 ENCOUNTER — Encounter: Payer: Self-pay | Admitting: Nurse Practitioner

## 2011-04-02 LAB — CBC
HCT: 26.7 — ABNORMAL LOW
Hemoglobin: 9.4 — ABNORMAL LOW
MCHC: 35.1
RDW: 12.6

## 2011-04-02 LAB — PROTIME-INR
INR: 1
Prothrombin Time: 13.7

## 2011-04-02 LAB — BASIC METABOLIC PANEL
CO2: 25
Calcium: 8.4
Glucose, Bld: 98
Potassium: 3.6
Sodium: 134 — ABNORMAL LOW

## 2011-04-02 LAB — TYPE AND SCREEN: Antibody Screen: NEGATIVE

## 2011-04-05 ENCOUNTER — Encounter: Payer: Self-pay | Admitting: Nurse Practitioner

## 2011-04-05 ENCOUNTER — Encounter: Payer: Self-pay | Admitting: Cardiothoracic Surgery

## 2011-04-15 ENCOUNTER — Ambulatory Visit: Payer: Medicare Other

## 2011-04-15 ENCOUNTER — Ambulatory Visit (INDEPENDENT_AMBULATORY_CARE_PROVIDER_SITE_OTHER): Payer: Medicare Other | Admitting: Internal Medicine

## 2011-04-15 DIAGNOSIS — Z7901 Long term (current) use of anticoagulants: Secondary | ICD-10-CM

## 2011-04-15 DIAGNOSIS — Z5181 Encounter for therapeutic drug level monitoring: Secondary | ICD-10-CM

## 2011-04-15 DIAGNOSIS — I4891 Unspecified atrial fibrillation: Secondary | ICD-10-CM

## 2011-04-15 NOTE — Patient Instructions (Signed)
Hold today then, 7.5 mg daily, Tues,  8.5 mg. Check 1 week

## 2011-04-22 ENCOUNTER — Ambulatory Visit (INDEPENDENT_AMBULATORY_CARE_PROVIDER_SITE_OTHER): Payer: Medicare Other | Admitting: Internal Medicine

## 2011-04-22 DIAGNOSIS — Z5181 Encounter for therapeutic drug level monitoring: Secondary | ICD-10-CM

## 2011-04-22 DIAGNOSIS — Z7901 Long term (current) use of anticoagulants: Secondary | ICD-10-CM

## 2011-04-22 DIAGNOSIS — I4891 Unspecified atrial fibrillation: Secondary | ICD-10-CM

## 2011-05-06 ENCOUNTER — Encounter: Payer: Self-pay | Admitting: Nurse Practitioner

## 2011-05-06 ENCOUNTER — Encounter: Payer: Self-pay | Admitting: Cardiothoracic Surgery

## 2011-05-06 ENCOUNTER — Ambulatory Visit (INDEPENDENT_AMBULATORY_CARE_PROVIDER_SITE_OTHER): Payer: Medicare Other | Admitting: Internal Medicine

## 2011-05-06 DIAGNOSIS — Z5181 Encounter for therapeutic drug level monitoring: Secondary | ICD-10-CM

## 2011-05-06 DIAGNOSIS — I4891 Unspecified atrial fibrillation: Secondary | ICD-10-CM

## 2011-05-06 DIAGNOSIS — Z7901 Long term (current) use of anticoagulants: Secondary | ICD-10-CM

## 2011-05-06 NOTE — Patient Instructions (Signed)
Continue  7.5 mg daily recheck 4 weeks

## 2011-06-03 ENCOUNTER — Ambulatory Visit: Payer: Medicare Other

## 2011-06-04 ENCOUNTER — Ambulatory Visit (INDEPENDENT_AMBULATORY_CARE_PROVIDER_SITE_OTHER): Payer: Medicare Other | Admitting: Internal Medicine

## 2011-06-04 DIAGNOSIS — Z7901 Long term (current) use of anticoagulants: Secondary | ICD-10-CM

## 2011-06-04 DIAGNOSIS — I4891 Unspecified atrial fibrillation: Secondary | ICD-10-CM

## 2011-06-04 DIAGNOSIS — Z5181 Encounter for therapeutic drug level monitoring: Secondary | ICD-10-CM

## 2011-06-04 NOTE — Patient Instructions (Signed)
Continue current dose, check in 4 weeks  

## 2011-06-10 ENCOUNTER — Other Ambulatory Visit: Payer: Self-pay | Admitting: *Deleted

## 2011-06-10 MED ORDER — OMEPRAZOLE MAGNESIUM 20 MG PO TBEC: 20.0000 mg | DELAYED_RELEASE_TABLET | Freq: Every day | ORAL | Status: DC

## 2011-06-22 ENCOUNTER — Ambulatory Visit (INDEPENDENT_AMBULATORY_CARE_PROVIDER_SITE_OTHER): Payer: Medicare Other | Admitting: Family Medicine

## 2011-06-22 ENCOUNTER — Encounter: Payer: Self-pay | Admitting: Family Medicine

## 2011-06-22 VITALS — BP 120/70 | HR 80 | Temp 97.8°F | Ht 68.0 in | Wt 130.0 lb

## 2011-06-22 DIAGNOSIS — T2104XA Burn of unspecified degree of lower back, initial encounter: Secondary | ICD-10-CM
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
## 2011-07-01 ENCOUNTER — Ambulatory Visit (INDEPENDENT_AMBULATORY_CARE_PROVIDER_SITE_OTHER): Payer: Medicare Other | Admitting: Internal Medicine

## 2011-07-01 DIAGNOSIS — Z5181 Encounter for therapeutic drug level monitoring: Secondary | ICD-10-CM

## 2011-07-01 DIAGNOSIS — I4891 Unspecified atrial fibrillation: Secondary | ICD-10-CM

## 2011-07-01 DIAGNOSIS — Z7901 Long term (current) use of anticoagulants: Secondary | ICD-10-CM

## 2011-07-01 NOTE — Patient Instructions (Signed)
Continue current dose, check in 4 weeks  

## 2011-07-02 ENCOUNTER — Ambulatory Visit: Payer: Medicare Other

## 2011-07-19 ENCOUNTER — Other Ambulatory Visit: Payer: Self-pay | Admitting: Internal Medicine

## 2011-07-29 ENCOUNTER — Ambulatory Visit (INDEPENDENT_AMBULATORY_CARE_PROVIDER_SITE_OTHER): Payer: Medicare Other | Admitting: Internal Medicine

## 2011-07-29 DIAGNOSIS — Z7901 Long term (current) use of anticoagulants: Secondary | ICD-10-CM

## 2011-07-29 DIAGNOSIS — I4891 Unspecified atrial fibrillation: Secondary | ICD-10-CM

## 2011-07-29 DIAGNOSIS — Z5181 Encounter for therapeutic drug level monitoring: Secondary | ICD-10-CM

## 2011-07-29 LAB — POCT INR: INR: 2.2

## 2011-07-29 NOTE — Patient Instructions (Signed)
Continue current dose, check in 4 weeks  

## 2011-07-30 ENCOUNTER — Ambulatory Visit: Payer: Medicare Other

## 2011-08-26 ENCOUNTER — Ambulatory Visit (INDEPENDENT_AMBULATORY_CARE_PROVIDER_SITE_OTHER): Payer: Medicare Other | Admitting: Internal Medicine

## 2011-08-26 DIAGNOSIS — Z7901 Long term (current) use of anticoagulants: Secondary | ICD-10-CM

## 2011-08-26 DIAGNOSIS — Z5181 Encounter for therapeutic drug level monitoring: Secondary | ICD-10-CM

## 2011-08-26 DIAGNOSIS — I4891 Unspecified atrial fibrillation: Secondary | ICD-10-CM

## 2011-08-26 LAB — POCT INR: INR: 1.9

## 2011-08-26 NOTE — Patient Instructions (Signed)
Continue current dose, check in 4 weeks  

## 2011-08-27 ENCOUNTER — Ambulatory Visit: Payer: Medicare Other

## 2011-08-30 ENCOUNTER — Ambulatory Visit: Payer: Medicare Other

## 2011-09-14 ENCOUNTER — Other Ambulatory Visit: Payer: Self-pay | Admitting: Internal Medicine

## 2011-09-24 ENCOUNTER — Ambulatory Visit (INDEPENDENT_AMBULATORY_CARE_PROVIDER_SITE_OTHER): Payer: Medicare Other | Admitting: Internal Medicine

## 2011-09-24 DIAGNOSIS — Z7901 Long term (current) use of anticoagulants: Secondary | ICD-10-CM

## 2011-09-24 DIAGNOSIS — I4891 Unspecified atrial fibrillation: Secondary | ICD-10-CM

## 2011-09-24 DIAGNOSIS — Z5181 Encounter for therapeutic drug level monitoring: Secondary | ICD-10-CM

## 2011-09-24 LAB — POCT INR: INR: 1.9

## 2011-09-24 NOTE — Patient Instructions (Signed)
Continue current dose, check in 4 weeks  

## 2011-09-28 ENCOUNTER — Ambulatory Visit: Payer: Medicare Other | Admitting: Internal Medicine

## 2011-10-08 ENCOUNTER — Ambulatory Visit (INDEPENDENT_AMBULATORY_CARE_PROVIDER_SITE_OTHER): Payer: Medicare Other | Admitting: Internal Medicine

## 2011-10-08 ENCOUNTER — Encounter: Payer: Self-pay | Admitting: Internal Medicine

## 2011-10-08 VITALS — BP 120/70 | HR 117 | Temp 98.5°F | Ht 68.0 in | Wt 124.0 lb

## 2011-10-08 DIAGNOSIS — I4891 Unspecified atrial fibrillation: Secondary | ICD-10-CM

## 2011-10-08 DIAGNOSIS — M159 Polyosteoarthritis, unspecified: Secondary | ICD-10-CM

## 2011-10-08 DIAGNOSIS — E785 Hyperlipidemia, unspecified: Secondary | ICD-10-CM

## 2011-10-08 DIAGNOSIS — E039 Hypothyroidism, unspecified: Secondary | ICD-10-CM

## 2011-10-08 NOTE — Assessment & Plan Note (Signed)
Discussed primary prevention again We will hold off on meds still

## 2011-10-08 NOTE — Assessment & Plan Note (Signed)
Good rate control Continues on coumadin 

## 2011-10-08 NOTE — Assessment & Plan Note (Signed)
Still with back pain Knees are better

## 2011-10-08 NOTE — Assessment & Plan Note (Signed)
Seems to be euthyroid Skin is friable--may need to hold off on support hose

## 2011-10-08 NOTE — Progress Notes (Signed)
Subjective:    Patient ID: [name], female    DOB: Apr 03, 1927, 78 y.o.   MRN: [id number]  HPI Has been in PT for ruptured rotator cuff on left Decided to defer surgery At [facility]  Knees still doing well Still uses pain meds for her back Had injection locally in back by Dr Noel Gerold recently  Did have open area on left calf Bruises easily  Having some stress with husband Irritable and "control freak"  No heart problems Never had palpitations No SOB Has been staying active with exercise program and gardening  Current Outpatient Prescriptions on File Prior to Visit  Medication Sig Dispense Refill  . atenolol (TENORMIN) 25 MG tablet TAKE ONE TABLET BY MOUTH EVERY DAY  90 tablet  3  . b complex vitamins tablet Take 1 tablet by mouth daily.        . Biotin 5000 MCG CAPS Take by mouth daily.        . Calcium Carbonate-Vitamin D 600-400 MG-UNIT per tablet Take 2 tablets by mouth daily.        . furosemide (LASIX) 40 MG tablet Take one to two tablets by mouth every day  180 tablet  1  . Glucosamine-Chondroit-Calcium 750-600-100 MG TABS Take 2 By mouth once daily       . levothyroxine (SYNTHROID, LEVOTHROID) 75 MCG tablet Generic ok.  90 tablet  1  . meloxicam (MOBIC) 15 MG tablet Take 1 tablet (15 mg total) by mouth daily.  90 tablet  3  . milk thistle 175 MG tablet Take 175 mg by mouth daily.        . Multiple Vitamins-Minerals (ONE-A-DAY 50 PLUS PO) Take by mouth daily.        . Omega-3 Fatty Acids (FISH OIL) 1200 MG CAPS Take 2 By mouth once daily       . omeprazole (PRILOSEC OTC) 20 MG tablet Take 1 tablet (20 mg total) by mouth daily.  90 tablet  3  . oxyCODONE-acetaminophen (PERCOCET) 10-325 MG per tablet Take 1 tablet by mouth every 4 (four) hours as needed.        . vitamin B-12 (CYANOCOBALAMIN) 1000 MCG tablet Take 1,000 mcg by mouth daily.        Marland Kitchen warfarin (COUMADIN) 5 MG tablet Take 5 mg by mouth daily.        Marland Kitchen warfarin (COUMADIN) 6 MG tablet Take 6 mg by mouth as  directed.        . warfarin (COUMADIN) 7.5 MG tablet TAKE 1 TABLET BY MOUTH DAILY EXCEPT FOR MONDAY AND THURSDAY  30 tablet  6    Allergies  Allergen Reactions  . Pradaxa Shortness Of Breath  . Penicillins   . Sulfonamide Derivatives     Past Medical History  Diagnosis Date  . Arthritis   . Hyperlipidemia   . Thyroid disease   . Atrial fibrillation     Past Surgical History  Procedure Date  . Rotator cuff repair   . Intraocular lens implant, secondary 1996/ 2010  . Knee surgery     lateral  . Tonsillectomy     Family History  Problem Relation Age of Onset  . Heart disease Mother     History   Social History  . Marital Status: Married    Spouse Name: N/A    Number of Children: N/A  . Years of Education: N/A   Occupational History  . retired    Social History Main Topics  . Smoking  status: Former Smoker    Types: Cigarettes  . Smokeless tobacco: Not on file  . Alcohol Use: Yes  . Drug Use: No  . Sexually Active: Not on file   Other Topics Concern  . Not on file   Social History Narrative   Regular exercise   Review of Systems Skin is fragile on the coumadin---bruises easily Wears compression hose--but causes skin breaks Doesn't have sig edema that much Appetite isn't great---weight stable    Objective:   Physical Exam  Constitutional: She appears well-developed and well-nourished. No distress.  Neck: Normal range of motion. Neck supple. No thyromegaly present.  Cardiovascular: Normal rate and normal heart sounds.  Exam reveals no gallop.   No murmur heard.      irregular  Pulmonary/Chest: Effort normal and breath sounds normal. No respiratory distress. She has no wheezes. She has no rales.  Musculoskeletal: She exhibits no edema and no tenderness.  Lymphadenopathy:    She has no cervical adenopathy.  Skin:       2 small ulcers on anterior left calf No infection Some granulation  Psychiatric: She has a normal mood and affect. Her behavior is  normal. Judgment and thought content normal.          Assessment & Plan:

## 2011-10-11 ENCOUNTER — Other Ambulatory Visit: Payer: Self-pay | Admitting: Internal Medicine

## 2011-10-22 ENCOUNTER — Ambulatory Visit (INDEPENDENT_AMBULATORY_CARE_PROVIDER_SITE_OTHER): Payer: Medicare Other | Admitting: Family Medicine

## 2011-10-22 DIAGNOSIS — Z7901 Long term (current) use of anticoagulants: Secondary | ICD-10-CM

## 2011-10-22 DIAGNOSIS — I4891 Unspecified atrial fibrillation: Secondary | ICD-10-CM

## 2011-10-22 DIAGNOSIS — Z5181 Encounter for therapeutic drug level monitoring: Secondary | ICD-10-CM

## 2011-10-22 LAB — POCT INR: INR: 3.4

## 2011-10-22 NOTE — Patient Instructions (Signed)
Hold x 1 then resume 7.5 mg daily recheck 1 week

## 2011-10-28 ENCOUNTER — Ambulatory Visit (INDEPENDENT_AMBULATORY_CARE_PROVIDER_SITE_OTHER): Payer: Medicare Other | Admitting: Internal Medicine

## 2011-10-28 DIAGNOSIS — Z7901 Long term (current) use of anticoagulants: Secondary | ICD-10-CM

## 2011-10-28 DIAGNOSIS — Z5181 Encounter for therapeutic drug level monitoring: Secondary | ICD-10-CM

## 2011-10-28 DIAGNOSIS — I4891 Unspecified atrial fibrillation: Secondary | ICD-10-CM

## 2011-10-28 LAB — POCT INR: INR: 3

## 2011-10-28 NOTE — Patient Instructions (Signed)
7.5 mg daily ,except 3.75 mg evert\y Thursday, recheck in 2 weeks

## 2011-10-28 NOTE — Progress Notes (Signed)
Addended by: Alvina Chou on: 10/28/2011 02:39 PM   Modules accepted: Orders

## 2011-11-12 ENCOUNTER — Ambulatory Visit (INDEPENDENT_AMBULATORY_CARE_PROVIDER_SITE_OTHER): Payer: Medicare Other | Admitting: Internal Medicine

## 2011-11-12 DIAGNOSIS — Z7901 Long term (current) use of anticoagulants: Secondary | ICD-10-CM

## 2011-11-12 DIAGNOSIS — I4891 Unspecified atrial fibrillation: Secondary | ICD-10-CM

## 2011-11-12 DIAGNOSIS — Z5181 Encounter for therapeutic drug level monitoring: Secondary | ICD-10-CM

## 2011-11-12 NOTE — Patient Instructions (Signed)
7.5 mg daily ,except 3.75 mg evert\y Thursday, recheck in 4 weeks

## 2011-11-17 ENCOUNTER — Other Ambulatory Visit: Payer: Self-pay | Admitting: Internal Medicine

## 2011-11-17 DIAGNOSIS — Z1231 Encounter for screening mammogram for malignant neoplasm of breast: Secondary | ICD-10-CM

## 2011-12-10 ENCOUNTER — Ambulatory Visit (INDEPENDENT_AMBULATORY_CARE_PROVIDER_SITE_OTHER): Payer: Medicare Other | Admitting: Internal Medicine

## 2011-12-10 ENCOUNTER — Ambulatory Visit: Payer: Medicare Other

## 2011-12-10 DIAGNOSIS — Z7901 Long term (current) use of anticoagulants: Secondary | ICD-10-CM

## 2011-12-10 DIAGNOSIS — I4891 Unspecified atrial fibrillation: Secondary | ICD-10-CM

## 2011-12-10 DIAGNOSIS — Z5181 Encounter for therapeutic drug level monitoring: Secondary | ICD-10-CM

## 2011-12-10 NOTE — Patient Instructions (Signed)
Continue current dose, check in 4 weeks  

## 2011-12-15 ENCOUNTER — Other Ambulatory Visit: Payer: Self-pay | Admitting: *Deleted

## 2011-12-15 MED ORDER — OMEPRAZOLE MAGNESIUM 20 MG PO TBEC: 20.0000 mg | DELAYED_RELEASE_TABLET | Freq: Every day | ORAL | Status: DC

## 2011-12-16 ENCOUNTER — Ambulatory Visit
Admission: RE | Admit: 2011-12-16 | Discharge: 2011-12-16 | Disposition: A | Discharge status: Home / Self Care | Payer: Medicare Other | Source: Ambulatory Visit | Attending: Internal Medicine | Admitting: Internal Medicine

## 2011-12-16 DIAGNOSIS — Z1231 Encounter for screening mammogram for malignant neoplasm of breast: Secondary | ICD-10-CM

## 2011-12-21 ENCOUNTER — Encounter: Payer: Self-pay | Admitting: *Deleted

## 2011-12-26 IMAGING — CR DG CHEST 2V
2 series · 2 of 2 positions shown · non-contrast
Comparison: 05/15/2009.

CLINICAL DATA: Preoperative exam.  History of pneumonia.

CHEST - 2 VIEW

[w chest pa]
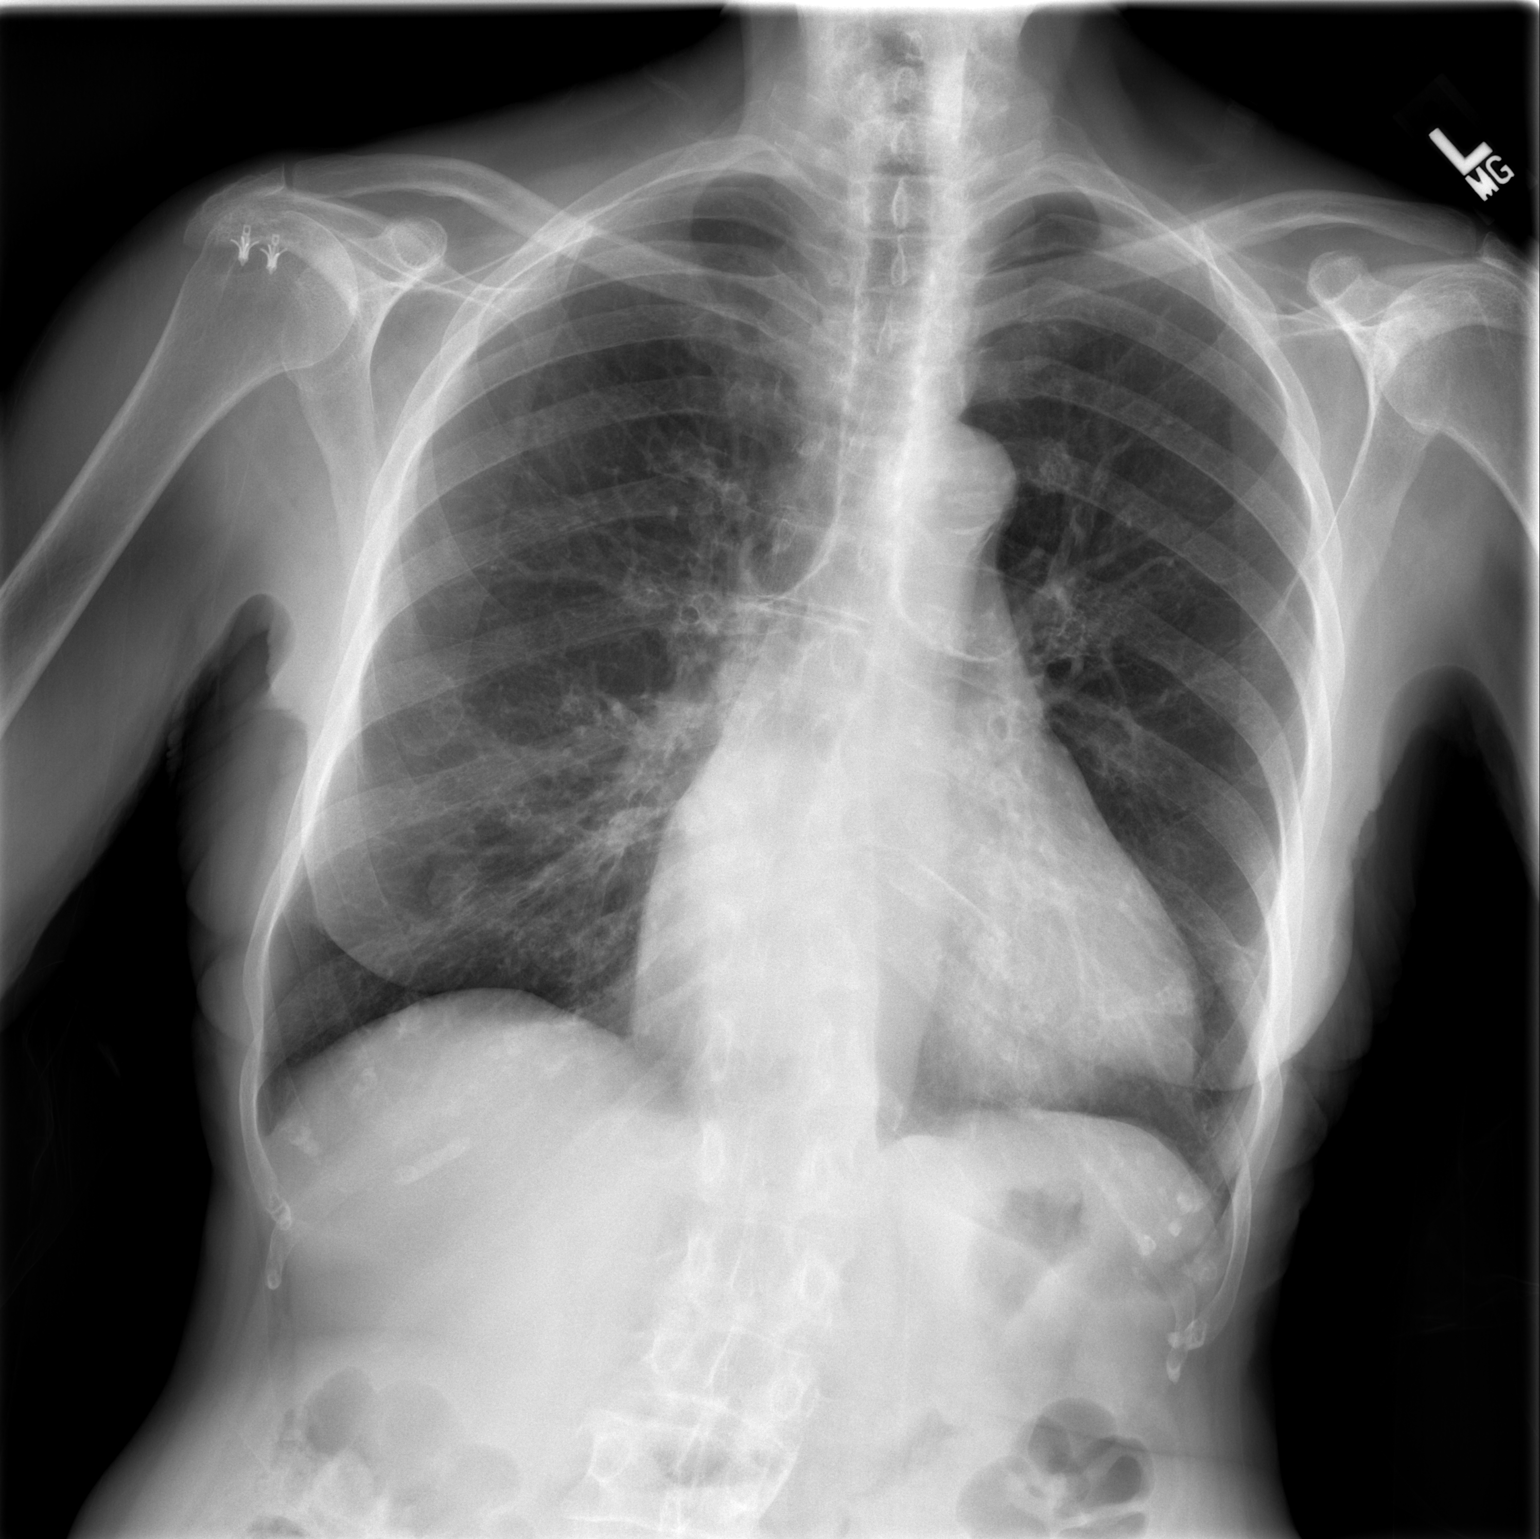

[w chest lat]
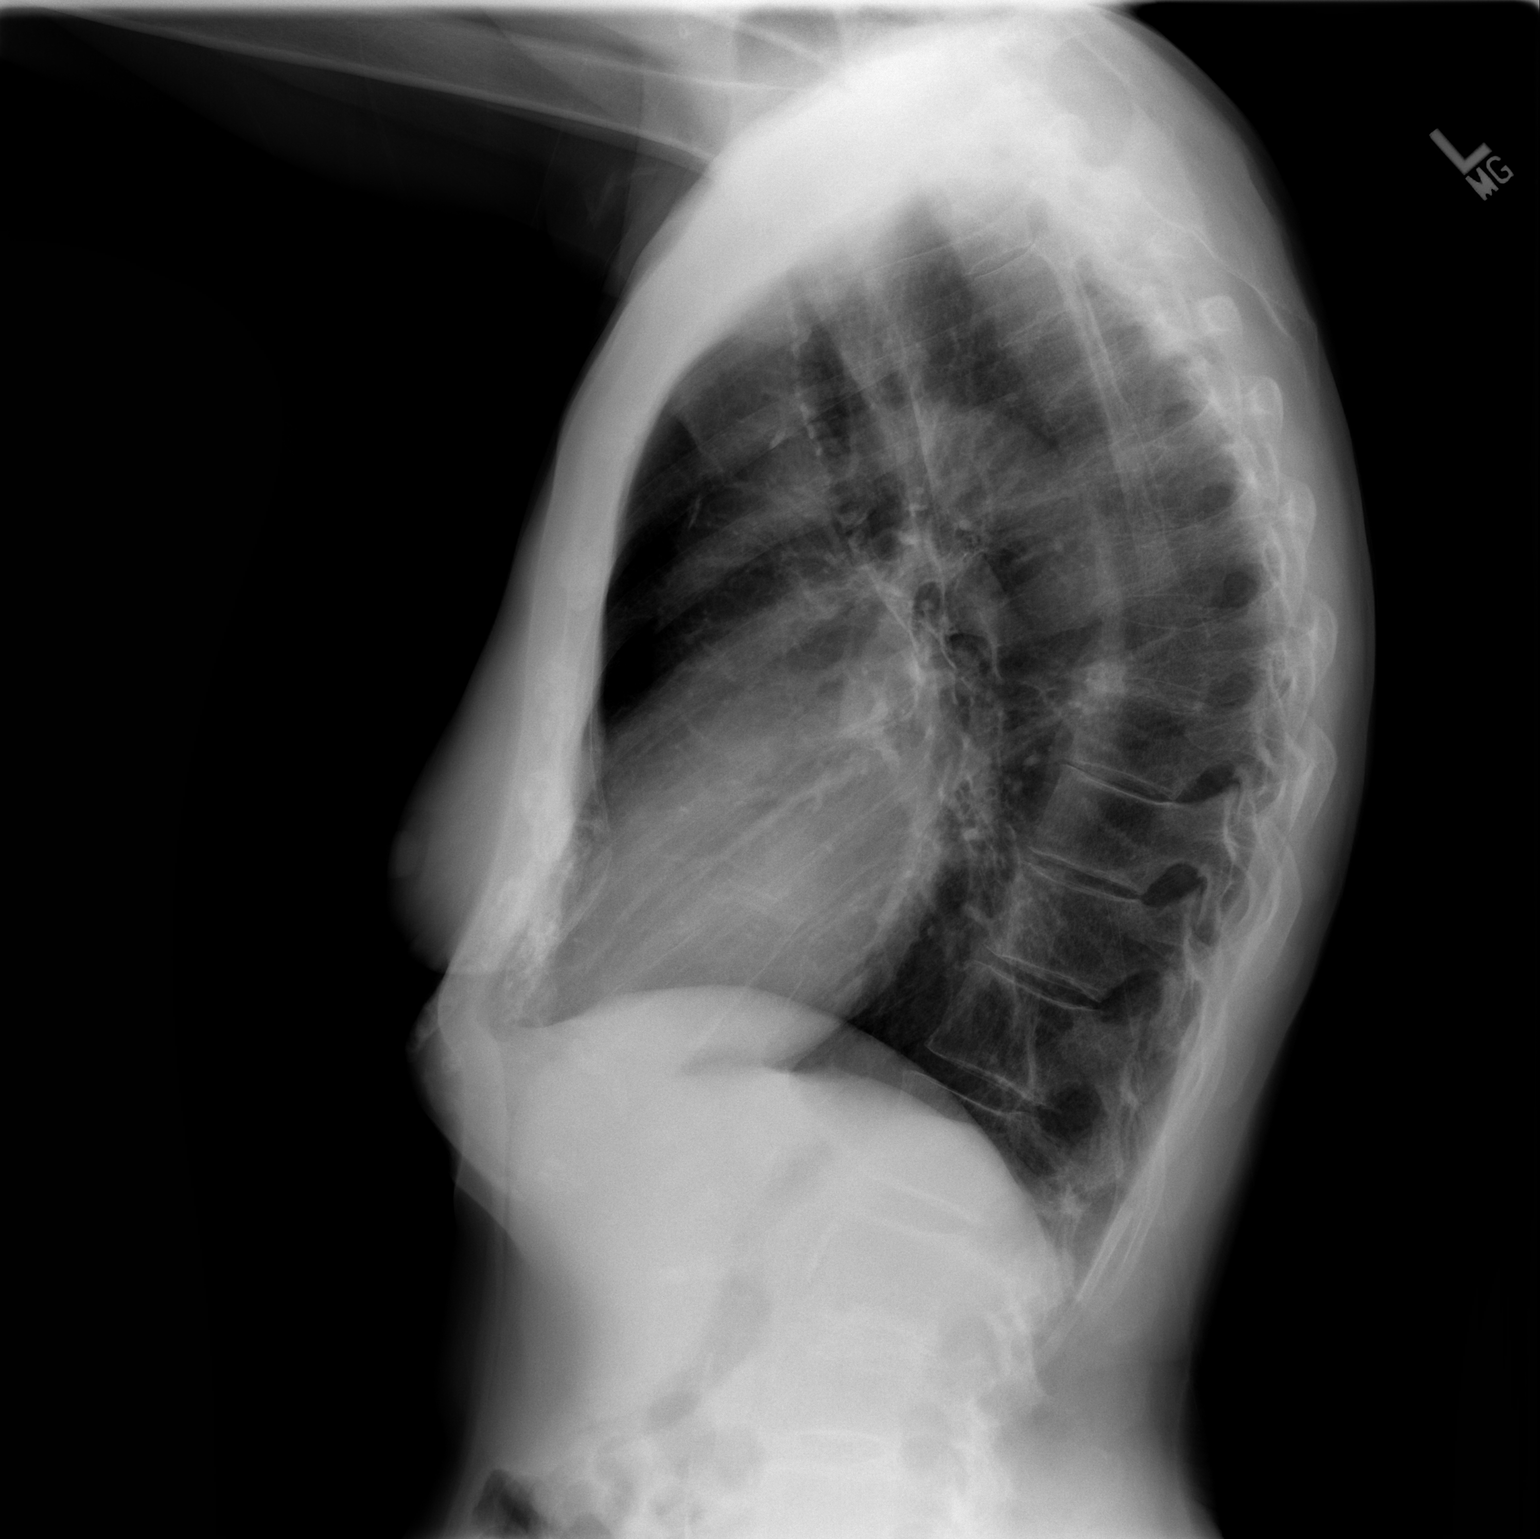

[2 of 2 positions shown; findings below may reference images not displayed]

FINDINGS: Chronic increased lung markings most notable centrally
with peribronchial thickening.  Prominent nipple shadows once again
noted.  Scarring projects to the left of the heart border.  Mild
pectus deformity. No infiltrate, congestive heart failure or
pneumothorax.

Mild kyphosis and scoliosis with degenerative changes thoracic
spine.  Mild cardiomegaly.  Calcified slightly tortuous aorta.
IMPRESSION: Chronic markings/peribronchial thickening without evidence of acute
infiltrate or congestive heart failure.

Mild cardiomegaly.

## 2011-12-29 ENCOUNTER — Ambulatory Visit (INDEPENDENT_AMBULATORY_CARE_PROVIDER_SITE_OTHER): Payer: Medicare Other | Admitting: Family Medicine

## 2011-12-29 ENCOUNTER — Encounter: Payer: Self-pay | Admitting: Family Medicine

## 2011-12-29 VITALS — BP 141/77 | HR 82 | Temp 97.5°F | Wt 128.5 lb

## 2011-12-29 DIAGNOSIS — R05 Cough: Secondary | ICD-10-CM | POA: Insufficient documentation

## 2011-12-29 NOTE — Progress Notes (Signed)
Subjective:    Patient ID: [name], female    DOB: [birth year], 78 y.o.   MRN: [id number]  HPI CC: anxiety issues and cough  Pleasant 78 yo with h/o arthritis, HLD, afib, and thyroid disease.  1.5 wks ago when awoke noticed bugs in bed.  Identified as bed bugs.  Pt has been itching for months, now knows where this came from.  Going through extermination process.  Started with spray on Monday, thinks this may have irritated lungs.  Then this week exterminators came and have been doing extermination process.    Started coughing late last week.  Then 2 d ago coughed up phlegm that was blood tinged.  No clots.  Mild nasal congestion.  Feeling fatigued and stressed.  Some bilateral leg swelling but attributes to prolonged standing last few days.  Bilateral knees replaced 2012.  Not vigorous cough.  Cough not worse at night.  Hasn't tried any cough meds.  Denies fevers/chills, RN, HA, chest pain, SOB, ST, ear or tooth pain.  No sick contacts at home.  No smokers at home.  No h/o COPD, asthma.  Husband is ill so has caregiver and she thinks that's who brought them in.  Bed bugs have really affected her nerves.  On mobic and warfarin - asked her to check with Dr. Alphonsus Sias regarding this combo due to interactions.  INR checked today. Lab Results  Component Value Date   INR 2.2 12/29/2011   INR 2.6 12/10/2011   INR 2.3 11/12/2011   Past Medical History  Diagnosis Date  . Arthritis   . Hyperlipidemia   . Thyroid disease   . Atrial fibrillation     Current Outpatient Prescriptions on File Prior to Visit  Medication Sig Dispense Refill  . atenolol (TENORMIN) 25 MG tablet TAKE ONE TABLET BY MOUTH EVERY DAY  90 tablet  3  . b complex vitamins tablet Take 1 tablet by mouth daily.        . Biotin 5000 MCG CAPS Take by mouth daily.        . Calcium Carbonate-Vitamin D 600-400 MG-UNIT per tablet Take 2 tablets by mouth daily.        . furosemide (LASIX) 40 MG tablet Take one to two tablets  by mouth every day  180 tablet  1  . Glucosamine-Chondroit-Calcium 750-600-100 MG TABS Take 2 By mouth once daily       . levothyroxine (SYNTHROID, LEVOTHROID) 75 MCG tablet TAKE ONE TABLET BY MOUTH EVERY DAY  90 tablet  3  . meloxicam (MOBIC) 15 MG tablet Take 1 tablet (15 mg total) by mouth daily.  90 tablet  3  . milk thistle 175 MG tablet Take 175 mg by mouth daily.        . Multiple Vitamins-Minerals (ONE-A-DAY 50 PLUS PO) Take by mouth daily.        . Omega-3 Fatty Acids (FISH OIL) 1200 MG CAPS Take 2 By mouth once daily       . omeprazole (PRILOSEC OTC) 20 MG tablet Take 1 tablet (20 mg total) by mouth daily.  90 tablet  3  . oxyCODONE-acetaminophen (PERCOCET) 10-325 MG per tablet Take 1 tablet by mouth every 4 (four) hours as needed.        . vitamin B-12 (CYANOCOBALAMIN) 1000 MCG tablet Take 1,000 mcg by mouth daily.        Marland Kitchen warfarin (COUMADIN) 5 MG tablet Take 5 mg by mouth daily.        Marland Kitchen  warfarin (COUMADIN) 6 MG tablet Take 6 mg by mouth as directed.        . warfarin (COUMADIN) 7.5 MG tablet TAKE 1 TABLET BY MOUTH DAILY EXCEPT FOR MONDAY AND THURSDAY  30 tablet  6     Review of Systems Per HPI    Objective:   Physical Exam  Nursing note and vitals reviewed. Constitutional: She appears well-developed and well-nourished. No distress.  HENT:  Head: Normocephalic and atraumatic.  Mouth/Throat: Oropharynx is clear and moist. No oropharyngeal exudate.  Eyes: Conjunctivae and EOM are normal. Pupils are equal, round, and reactive to light. No scleral icterus.  Neck: Normal range of motion. Neck supple.  Cardiovascular: Normal rate and intact distal pulses.  An irregularly irregular rhythm present.  No murmur heard. Pulmonary/Chest: Effort normal and breath sounds normal. No respiratory distress. She has no wheezes. She has no rales.       Cough in office with scant blood tinged sputum production  Musculoskeletal: She exhibits edema (tr pedal edema bilaterally).  Skin: Skin is  warm and dry. No rash noted.  Psychiatric: She has a normal mood and affect.      Assessment & Plan:

## 2011-12-29 NOTE — Assessment & Plan Note (Addendum)
Of 5d duration in h/o coumadin use however INR 2.2 today. Doubt infectious cause - more likely irritant from recent fume exposure. Discussed red flags to return or if any concern for infection. For now, monitor sxs and would expect slow improvement over time.

## 2011-12-29 NOTE — Patient Instructions (Signed)
7.5 mg daily ,except 3.75 mg every Thursday, recheck in 4 weeks 

## 2011-12-29 NOTE — Patient Instructions (Addendum)
Lungs sound clear today.  I think the bleeding has come from a broken capillary - coumadin can worsen this. I don't think theres' infection today, could be irritation to fumes at home - ensure you're in well ventilated area at home. Try immediate release guaifenesin with plenty of fluid to mobilize mucous. Let us know if bleeding getting more frequent or clots, or worsening symptoms.  If worsening cough, let us know. Good to see you today, call us with questions

## 2012-01-03 ENCOUNTER — Ambulatory Visit (INDEPENDENT_AMBULATORY_CARE_PROVIDER_SITE_OTHER): Payer: Medicare Other | Admitting: Family Medicine

## 2012-01-03 ENCOUNTER — Ambulatory Visit: Payer: Self-pay | Admitting: Family Medicine

## 2012-01-03 ENCOUNTER — Encounter: Payer: Self-pay | Admitting: Family Medicine

## 2012-01-03 VITALS — BP 130/80 | HR 77 | Temp 97.5°F | Ht 68.0 in | Wt 133.8 lb

## 2012-01-03 DIAGNOSIS — R609 Edema, unspecified: Secondary | ICD-10-CM

## 2012-01-03 DIAGNOSIS — R600 Localized edema: Secondary | ICD-10-CM

## 2012-01-03 DIAGNOSIS — M79609 Pain in unspecified limb: Secondary | ICD-10-CM

## 2012-01-03 DIAGNOSIS — M79605 Pain in left leg: Secondary | ICD-10-CM

## 2012-01-03 MED ORDER — CELECOXIB 200 MG PO CAPS: 200.0000 mg | ORAL_CAPSULE | Freq: Every day | ORAL | Status: AC

## 2012-01-03 NOTE — Progress Notes (Signed)
Nature conservation officer at [facility] [address] [place] [address] Phone: [phone] Fax: [phone]   Patient Name: [name] Date of Birth: 1927-05-20 Age: 78 y.o. Medical Record Number: [id number] Gender: female Date of Encounter: 01/03/2012  Chief Complaint: Leg Swelling and leg cramps   History of Present Illness:  [name] is a 78 y.o. very pleasant female patient who presents with the following:  Went to exercise class last Friday with gym, turned the treadmill up and increased the intensitiy and worked out with a couple of miles. Friday night had some cramps in her legs. Has never had some cramps so bad. Particularly in the left leg. Put some heat on it. So sore - posteriorly. She also has been drinking a lot of Gatorade, now she has lower extremity edema, left greater than right.  She is chronically take Coumadin. She has been taking Mobic for some time. She did cough up a small amount of blood a couple of weeks ago. This is subsided.  Past Medical History, Surgical History, Social History, Family History, Problem List, Medications, and Allergies have been reviewed and updated if relevant.  Current Outpatient Prescriptions on File Prior to Visit  Medication Sig Dispense Refill  . atenolol (TENORMIN) 25 MG tablet TAKE ONE TABLET BY MOUTH EVERY DAY  90 tablet  3  . b complex vitamins tablet Take 1 tablet by mouth daily.        . Biotin 5000 MCG CAPS Take by mouth daily.        . Calcium Carbonate-Vitamin D 600-400 MG-UNIT per tablet Take 2 tablets by mouth daily.        . furosemide (LASIX) 40 MG tablet Take one to two tablets by mouth every day  180 tablet  1  . Glucosamine-Chondroit-Calcium 750-600-100 MG TABS Take 2 By mouth once daily       . levothyroxine (SYNTHROID, LEVOTHROID) 75 MCG tablet TAKE ONE TABLET BY MOUTH EVERY DAY  90 tablet  3  . meloxicam (MOBIC) 15 MG tablet Take 1 tablet (15 mg total) by mouth daily.  90 tablet  3  . milk thistle  175 MG tablet Take 175 mg by mouth daily.        . Multiple Vitamins-Minerals (ONE-A-DAY 50 PLUS PO) Take by mouth daily.        . Omega-3 Fatty Acids (FISH OIL) 1200 MG CAPS Take 2 By mouth once daily       . omeprazole (PRILOSEC OTC) 20 MG tablet Take 1 tablet (20 mg total) by mouth daily.  90 tablet  3  . oxyCODONE-acetaminophen (PERCOCET) 10-325 MG per tablet Take 1 tablet by mouth every 4 (four) hours as needed.        . vitamin B-12 (CYANOCOBALAMIN) 1000 MCG tablet Take 1,000 mcg by mouth daily.        Marland Kitchen warfarin (COUMADIN) 5 MG tablet Take 5 mg by mouth daily.        Marland Kitchen warfarin (COUMADIN) 6 MG tablet Take 6 mg by mouth as directed.        . warfarin (COUMADIN) 7.5 MG tablet TAKE 1 TABLET BY MOUTH DAILY EXCEPT FOR MONDAY AND THURSDAY  30 tablet  6    Review of Systems: As above. No chest pain or shortness of breath. Muscular pain as described. Main pain is in the posterior aspect of left calf the  Physical Examination: Filed Vitals:   01/03/12 1053  BP: 130/80  Pulse: 77  Temp: 97.5 F (36.4 C)   Filed Vitals:   01/03/12 1053  Height: 5\' 8"  (1.727 m)  Weight: 133 lb 12.8 oz (60.691 kg)   Body mass index is 20.34 kg/(m^2). Ideal Body Weight: Weight in (lb) to have BMI = 25: 164.1    GEN: WDWN, NAD, Non-toxic, A & O x 3 HEENT: Atraumatic, Normocephalic. Neck supple. No masses, No LAD. Ears and Nose: No external deformity. CV: RRR, No M/G/R. No JVD. No thrill. No extra heart sounds. PULM: CTA B, no wheezes, crackles, rhonchi. No retractions. No resp. distress. No accessory muscle use. EXTR: No c/c. 1+ LE edema. Tenderness with posterior popliteal squeeze and squeezing of the left calf. NEURO Normal gait.  PSYCH: Normally interactive. Conversant. Not depressed or anxious appearing.  Calm demeanor.     EKG / Xrays / Labs: None available at the time of encounter.  Assessment and Plan: 1. Leg pain, left  US Venous Img Lower Unilateral Left  2. Leg edema  US Venous Img  Lower Unilateral Left    Obtain ultrasound of the left lower extremity and rule out for potential DVT. The patient is already on Coumadin. Classical examination with a positive Homans sign, however, we do need to fully rule out venous thrombosis.   Suspect more likely this is all purely muscular cramping.  Decrease her fluid intake. She can take an additional Lasix today.  Change from Mobic to Celebrex. Discussed that with the inpatient pharmacy, and there is a rare slight risk of cross reactivity given a past history of sulfa allergy. I have given her a few samples to try to see if she has any form of reaction.  Hannah Beat, MD

## 2012-01-04 ENCOUNTER — Encounter: Payer: Self-pay | Admitting: Family Medicine

## 2012-01-10 ENCOUNTER — Ambulatory Visit (INDEPENDENT_AMBULATORY_CARE_PROVIDER_SITE_OTHER): Payer: Medicare Other | Admitting: Family Medicine

## 2012-01-10 ENCOUNTER — Encounter: Payer: Self-pay | Admitting: Family Medicine

## 2012-01-10 VITALS — BP 120/72 | HR 81 | Temp 97.5°F | Ht 68.0 in | Wt 129.5 lb

## 2012-01-10 DIAGNOSIS — R238 Other skin changes: Secondary | ICD-10-CM

## 2012-01-10 DIAGNOSIS — M79609 Pain in unspecified limb: Secondary | ICD-10-CM

## 2012-01-10 DIAGNOSIS — M79605 Pain in left leg: Secondary | ICD-10-CM

## 2012-01-10 NOTE — Progress Notes (Signed)
Nature conservation officer at [facility] [address] [place] [address] Phone: [phone] Fax: [phone]   Patient Name: [name] Date of Birth: March 13, 1927 Age: 78 y.o. Medical Record Number: [id number] Gender: female Date of Encounter: 01/10/2012  Chief Complaint: Follow-up   History of Present Illness:  [name] is a 78 y.o. very pleasant female patient who presents with the following:  F/u L leg, hematoma and bruising The patient follows up again after I saw her last week, and she is continuing to have some LEFT calf pain. Now she is having some bruising throughout her posterior calf. Last week when I saw her, I checked for deep vein thrombosis with Doppler ultrasound, and this was negative.  Past Medical History, Surgical History, Social History, Family History, Problem List, Medications, and Allergies have been reviewed and updated if relevant.  Current Outpatient Prescriptions on File Prior to Visit  Medication Sig Dispense Refill  . atenolol (TENORMIN) 25 MG tablet TAKE ONE TABLET BY MOUTH EVERY DAY  90 tablet  3  . b complex vitamins tablet Take 1 tablet by mouth daily.        . Biotin 5000 MCG CAPS Take by mouth daily.        . Calcium Carbonate-Vitamin D 600-400 MG-UNIT per tablet Take 2 tablets by mouth daily.        . celecoxib (CELEBREX) 200 MG capsule Take 1 capsule (200 mg total) by mouth daily.  30 capsule  5  . furosemide (LASIX) 40 MG tablet Take one to two tablets by mouth every day  180 tablet  1  . Glucosamine-Chondroit-Calcium 750-600-100 MG TABS Take 2 By mouth once daily       . levothyroxine (SYNTHROID, LEVOTHROID) 75 MCG tablet TAKE ONE TABLET BY MOUTH EVERY DAY  90 tablet  3  . milk thistle 175 MG tablet Take 175 mg by mouth daily.        . Multiple Vitamins-Minerals (ONE-A-DAY 50 PLUS PO) Take by mouth daily.        . Omega-3 Fatty Acids (FISH OIL) 1200 MG CAPS Take 2 By mouth once daily       . omeprazole (PRILOSEC OTC) 20 MG  tablet Take 1 tablet (20 mg total) by mouth daily.  90 tablet  3  . oxyCODONE-acetaminophen (PERCOCET) 10-325 MG per tablet Take 1 tablet by mouth every 4 (four) hours as needed.        . vitamin B-12 (CYANOCOBALAMIN) 1000 MCG tablet Take 1,000 mcg by mouth daily.        Marland Kitchen warfarin (COUMADIN) 5 MG tablet Take 5 mg by mouth daily.        Marland Kitchen warfarin (COUMADIN) 6 MG tablet Take 6 mg by mouth as directed.        . warfarin (COUMADIN) 7.5 MG tablet TAKE 1 TABLET BY MOUTH DAILY EXCEPT FOR MONDAY AND THURSDAY  30 tablet  6    Review of Systems:  GEN: No fevers, chills. Nontoxic. Primarily MSK c/o today. MSK: Detailed in the HPI GI: tolerating PO intake without difficulty Neuro: No numbness, parasthesias, or tingling associated. Otherwise the pertinent positives of the ROS are noted above.    Physical Examination: Filed Vitals:   01/10/12 1028  BP: 120/72  Pulse: 81  Temp: 97.5 F (36.4 C)   Filed Vitals:   01/10/12 1028  Height: 5\' 8"  (1.727 m)  Weight: 129 lb 8 oz (58.741 kg)   Body mass index is 19.69 kg/(m^2).  Ideal Body Weight: Weight in (lb) to have BMI = 25: 164.1    GEN: WDWN, NAD, Non-toxic, Alert & Oriented x 3 HEENT: Atraumatic, Normocephalic.  Ears and Nose: No external deformity. EXTR: No clubbing/cyanosis/edema NEURO: Normal gait.  PSYCH: Normally interactive. Conversant. Not depressed or anxious appearing.  Calm demeanor.   LEFT lower leg with some extensive posterior bruising. She does have some tenderness in the midportion of the medial gastrocnemius.  EKG / Xrays / Labs: None available at the time of encounter.  Assessment and Plan: 1. Leg pain, left   2. Easy bruising     The patient is on Coumadin, and I suspect she probably had a partial gastrocnemius small rupture, but due to her anticoagulation, she bled more, resulting in a hematoma and bruising. I reassured her, and this should resolve over the next few weeks  Hannah Beat, MD

## 2012-01-12 ENCOUNTER — Ambulatory Visit: Payer: Medicare Other | Admitting: Family Medicine

## 2012-01-14 ENCOUNTER — Ambulatory Visit: Payer: Medicare Other

## 2012-01-19 ENCOUNTER — Ambulatory Visit (INDEPENDENT_AMBULATORY_CARE_PROVIDER_SITE_OTHER): Payer: Medicare Other | Admitting: Family Medicine

## 2012-01-19 ENCOUNTER — Encounter: Payer: Self-pay | Admitting: Family Medicine

## 2012-01-19 VITALS — BP 140/74 | HR 84 | Temp 97.8°F | Wt 133.5 lb

## 2012-01-19 DIAGNOSIS — L299 Pruritus, unspecified: Secondary | ICD-10-CM | POA: Insufficient documentation

## 2012-01-19 NOTE — Assessment & Plan Note (Addendum)
No other lesions, ie palms and soles.  No clear trigger, will try to disrupt itch scratch cycle with claritin and TAC/eucerin combination.  She has both creams at home. If not improved, she may need derm eval. I'd like to avoid oral steroids.  dw pt, she agrees. She is on a relatively new med with celebrex, but this doesn't look like a typical drug rash.

## 2012-01-19 NOTE — Progress Notes (Signed)
B leg itching.  Failed tx with eucerin, hydrocortisone, gold bond, caladryl, oral benadryl.  On known exposure or trigger.  Had likely gastroc strain on L side.    Feels fine otherwise.  Walking well and her L leg is much improved from a MSK standpoint.    Meds, vitals, and allergies reviewed.   ROS: See HPI.  Otherwise, noncontributory.  nad ncat Edema in L>R leg B excoriations in the shins w/o superinfection

## 2012-01-19 NOTE — Patient Instructions (Addendum)
I would mix triamcinolone cream and Eucerin on your legs three times a a day. Take 10mg  of claritin (loratadine) once a day, in place of benadryl. If not improved, notify us.  Take care.

## 2012-01-28 ENCOUNTER — Ambulatory Visit (INDEPENDENT_AMBULATORY_CARE_PROVIDER_SITE_OTHER): Payer: Medicare Other | Admitting: Family Medicine

## 2012-01-28 DIAGNOSIS — Z7901 Long term (current) use of anticoagulants: Secondary | ICD-10-CM

## 2012-01-28 DIAGNOSIS — Z5181 Encounter for therapeutic drug level monitoring: Secondary | ICD-10-CM

## 2012-01-28 DIAGNOSIS — I4891 Unspecified atrial fibrillation: Secondary | ICD-10-CM

## 2012-01-28 NOTE — Patient Instructions (Signed)
Continue current dose, check in 4 weeks  

## 2012-02-25 ENCOUNTER — Ambulatory Visit (INDEPENDENT_AMBULATORY_CARE_PROVIDER_SITE_OTHER): Payer: Medicare Other | Admitting: Internal Medicine

## 2012-02-25 DIAGNOSIS — I4891 Unspecified atrial fibrillation: Secondary | ICD-10-CM

## 2012-02-25 DIAGNOSIS — Z7901 Long term (current) use of anticoagulants: Secondary | ICD-10-CM

## 2012-02-25 DIAGNOSIS — Z5181 Encounter for therapeutic drug level monitoring: Secondary | ICD-10-CM

## 2012-02-25 NOTE — Patient Instructions (Signed)
Continue current dose, check in 4 weeks  Cut back greens

## 2012-03-23 ENCOUNTER — Other Ambulatory Visit: Payer: Self-pay | Admitting: Internal Medicine

## 2012-03-24 ENCOUNTER — Other Ambulatory Visit: Payer: Self-pay | Admitting: *Deleted

## 2012-03-24 MED ORDER — WARFARIN SODIUM 7.5 MG PO TABS: 7.5000 mg | ORAL_TABLET | Freq: Every day | ORAL | Status: DC

## 2012-03-31 ENCOUNTER — Ambulatory Visit (INDEPENDENT_AMBULATORY_CARE_PROVIDER_SITE_OTHER): Payer: Medicare Other | Admitting: Internal Medicine

## 2012-03-31 DIAGNOSIS — Z5181 Encounter for therapeutic drug level monitoring: Secondary | ICD-10-CM

## 2012-03-31 DIAGNOSIS — Z7901 Long term (current) use of anticoagulants: Secondary | ICD-10-CM

## 2012-03-31 DIAGNOSIS — I4891 Unspecified atrial fibrillation: Secondary | ICD-10-CM

## 2012-03-31 LAB — POCT INR: INR: 2.3

## 2012-03-31 NOTE — Patient Instructions (Signed)
7.5 mg daily ,except 3.75 mg every Thursday, recheck in 4 weeks

## 2012-04-28 ENCOUNTER — Ambulatory Visit (INDEPENDENT_AMBULATORY_CARE_PROVIDER_SITE_OTHER): Payer: Medicare Other | Admitting: Internal Medicine

## 2012-04-28 ENCOUNTER — Ambulatory Visit: Payer: Medicare Other

## 2012-04-28 DIAGNOSIS — Z23 Encounter for immunization: Secondary | ICD-10-CM

## 2012-04-28 DIAGNOSIS — Z5181 Encounter for therapeutic drug level monitoring: Secondary | ICD-10-CM

## 2012-04-28 DIAGNOSIS — Z7901 Long term (current) use of anticoagulants: Secondary | ICD-10-CM

## 2012-04-28 DIAGNOSIS — I4891 Unspecified atrial fibrillation: Secondary | ICD-10-CM

## 2012-04-28 LAB — POCT INR: INR: 2.9

## 2012-04-28 NOTE — Patient Instructions (Signed)
Continue current dose, check in 4 weeks  

## 2012-05-02 ENCOUNTER — Ambulatory Visit: Payer: Medicare Other

## 2012-05-15 ENCOUNTER — Encounter: Payer: Self-pay | Admitting: Internal Medicine

## 2012-05-15 ENCOUNTER — Ambulatory Visit (INDEPENDENT_AMBULATORY_CARE_PROVIDER_SITE_OTHER): Payer: Medicare Other | Admitting: Internal Medicine

## 2012-05-15 VITALS — BP 122/70 | HR 77 | Temp 97.9°F | Wt 124.0 lb

## 2012-05-15 DIAGNOSIS — I4891 Unspecified atrial fibrillation: Secondary | ICD-10-CM

## 2012-05-15 DIAGNOSIS — E039 Hypothyroidism, unspecified: Secondary | ICD-10-CM

## 2012-05-15 DIAGNOSIS — Z0000 Encounter for general adult medical examination without abnormal findings: Secondary | ICD-10-CM

## 2012-05-15 DIAGNOSIS — E785 Hyperlipidemia, unspecified: Secondary | ICD-10-CM

## 2012-05-15 DIAGNOSIS — M159 Polyosteoarthritis, unspecified: Secondary | ICD-10-CM

## 2012-05-15 DIAGNOSIS — R079 Chest pain, unspecified: Secondary | ICD-10-CM

## 2012-05-15 LAB — BASIC METABOLIC PANEL
CO2: 29 mEq/L (ref 19–32)
Calcium: 9.7 mg/dL (ref 8.4–10.5)
Chloride: 100 mEq/L (ref 96–112)
Glucose, Bld: 98 mg/dL (ref 70–99)
Potassium: 3.8 mEq/L (ref 3.5–5.1)
Sodium: 138 mEq/L (ref 135–145)

## 2012-05-15 LAB — CBC WITH DIFFERENTIAL/PLATELET
Basophils Absolute: 0 10*3/uL (ref 0.0–0.1)
Basophils Relative: 0.3 % (ref 0.0–3.0)
Eosinophils Absolute: 0 10*3/uL (ref 0.0–0.7)
HCT: 38.3 % (ref 36.0–46.0)
Hemoglobin: 12.5 g/dL (ref 12.0–15.0)
Lymphocytes Relative: 14.7 % (ref 12.0–46.0)
Lymphs Abs: 1 10*3/uL (ref 0.7–4.0)
MCHC: 32.6 g/dL (ref 30.0–36.0)
Neutro Abs: 4.9 10*3/uL (ref 1.4–7.7)
RBC: 4.04 Mil/uL (ref 3.87–5.11)
RDW: 13.8 % (ref 11.5–14.6)

## 2012-05-15 LAB — LIPID PANEL: Total CHOL/HDL Ratio: 2

## 2012-05-15 NOTE — Assessment & Plan Note (Signed)
Good rate control On warfarin --monitored here

## 2012-05-15 NOTE — Assessment & Plan Note (Signed)
Mostly in back Has aching in heels and elbows as well as muscles Will check labs

## 2012-05-15 NOTE — Progress Notes (Signed)
Subjective:    Patient ID: [name], female    DOB: [birth year], 78 y.o.   MRN: [id number]  HPI Here for wellness visit Reviewed multiple other doctors No depression or anhedonia Reviewed advanced directives Able to do ADLs and instrumental ADLs Very active in community Tries to exercise regularly No vision problems--uses contacts for right for reading Mild hearing loss  Lots of stress with husband Has 24 hour care, 5 days per week Still frustrates her Now has to do all the finances--and then he is critical Doesn't feel abused  Ongoing back problems Uses the oxycodone and muscle relaxer as needed Has been having bad leg cramps at night---robaxin some help. Mustard may help slightly Gets elbow and heel pain at night also Did get expensive exercise shoes without help  Left neck aching at times--"like I swallowed wrong" Has had some chest pain with this At rest only No associated SOB or diaphoresis Seems more like "in abdomen" Exercise tolerance is stable No palpitations  Not excited about primary prevention with statins Was on in the past  Current Outpatient Prescriptions on File Prior to Visit  Medication Sig Dispense Refill  . atenolol (TENORMIN) 25 MG tablet TAKE ONE TABLET BY MOUTH EVERY DAY  90 tablet  3  . b complex vitamins tablet Take 1 tablet by mouth daily.        . Biotin 5000 MCG CAPS Take by mouth daily.        . Calcium Carbonate-Vitamin D 600-400 MG-UNIT per tablet Take 2 tablets by mouth daily.        . furosemide (LASIX) 40 MG tablet Take one to two tablets by mouth every day  180 tablet  1  . Glucosamine-Chondroit-Calcium 750-600-100 MG TABS Take 2 By mouth once daily       . levothyroxine (SYNTHROID, LEVOTHROID) 75 MCG tablet TAKE ONE TABLET BY MOUTH EVERY DAY  90 tablet  3  . milk thistle 175 MG tablet Take 175 mg by mouth daily.        . Multiple Vitamins-Minerals (ONE-A-DAY 50 PLUS PO) Take by mouth daily.        . Omega-3 Fatty Acids (FISH  OIL) 1200 MG CAPS Take 2 By mouth once daily       . omeprazole (PRILOSEC OTC) 20 MG tablet Take 1 tablet (20 mg total) by mouth daily.  90 tablet  3  . oxyCODONE-acetaminophen (PERCOCET) 10-325 MG per tablet Take 1 tablet by mouth every 4 (four) hours as needed.        . vitamin B-12 (CYANOCOBALAMIN) 1000 MCG tablet Take 1,000 mcg by mouth daily.        Marland Kitchen warfarin (COUMADIN) 5 MG tablet Take 5 mg by mouth daily.        . [DISCONTINUED] warfarin (COUMADIN) 6 MG tablet Take 6 mg by mouth as directed.        . [DISCONTINUED] warfarin (COUMADIN) 7.5 MG tablet Take 1 tablet (7.5 mg total) by mouth daily.  90 tablet  3    Allergies  Allergen Reactions  . Dabigatran Etexilate Mesylate Shortness Of Breath  . Penicillins   . Sulfonamide Derivatives     Past Medical History  Diagnosis Date  . Arthritis   . Hyperlipidemia   . Thyroid disease   . Atrial fibrillation     Past Surgical History  Procedure Date  . Rotator cuff repair   . Intraocular lens implant, secondary 1996/ 2010  . Knee surgery  lateral  . Tonsillectomy     Family History  Problem Relation Age of Onset  . Heart disease Mother     History   Social History  . Marital Status: Married    Spouse Name: N/A    Number of Children: N/A  . Years of Education: N/A   Occupational History  . retired    Social History Main Topics  . Smoking status: Former Smoker    Types: Cigarettes  . Smokeless tobacco: Never Used  . Alcohol Use: Yes  . Drug Use: No  . Sexually Active: Not on file   Other Topics Concern  . Not on file   Social History Narrative   Regular exerciseRemains active with church and community activitiesHas living willHusband, then Jones Skene (friend), is health care [facility] DNR order---form done again 11/11/13No tube feeds if cognitively unaware   Review of Systems Has shoulder pain also Sleeps okay in general Appetite is fine Weight is down 9#---has been careful to try to get back to  her former weight celebrex may have helped hands Omeprazole controls her stomach issues--would feel uncomfortable at times    Objective:   Physical Exam  Constitutional: She is oriented to person, place, and time. She appears well-developed. No distress.  Neck: Normal range of motion. Neck supple. No thyromegaly present.  Cardiovascular: Normal rate and intact distal pulses.  Exam reveals no gallop.   No murmur heard.      Irregular   Pulmonary/Chest: Effort normal and breath sounds normal. No respiratory distress. She has no wheezes. She has no rales.  Abdominal: Soft. There is no tenderness.  Musculoskeletal: She exhibits no edema and no tenderness.  Lymphadenopathy:    She has no cervical adenopathy.  Neurological: She is alert and oriented to person, place, and time.       President-- "Obama, Bush, Bush, " then [employer]" [phone] [id number] Recall 3/3  Psychiatric: She has a normal mood and affect. Her behavior is normal.       some stress but not depressed          Assessment & Plan:

## 2012-05-15 NOTE — Assessment & Plan Note (Signed)
Discussed primary prevention again at her age Will not use meds

## 2012-05-15 NOTE — Assessment & Plan Note (Signed)
I have personally reviewed the Medicare Annual Wellness questionnaire and have noted 1. The patient's medical and social history 2. Their use of alcohol, tobacco or illicit drugs 3. Their current medications and supplements 4. The patient's functional ability including ADL's, fall risks, home safety risks and hearing or visual             impairment. 5. Diet and physical activities 6. Evidence for depression or mood disorders  The patients weight, height, BMI and visual acuity have been recorded in the chart I have made referrals, counseling and provided education to the patient based review of the above and I have provided the pt with a written personalized care plan for preventive services.  I have provided you with a copy of your personalized plan for preventive services. Please take the time to review along with your updated medication list.  Mild hearing loss Stress with caregiving with husband No cancer screening Thinks she had Td but she will check

## 2012-05-15 NOTE — Assessment & Plan Note (Signed)
Atypical symptoms EKG looks about the same as 2 years ago (at Dr Dagoberto Ligas)

## 2012-05-16 ENCOUNTER — Encounter: Payer: Self-pay | Admitting: *Deleted

## 2012-05-16 LAB — TSH: TSH: 1.83 u[IU]/mL (ref 0.35–5.50)

## 2012-05-24 ENCOUNTER — Other Ambulatory Visit: Payer: Self-pay | Admitting: Internal Medicine

## 2012-05-26 ENCOUNTER — Ambulatory Visit (INDEPENDENT_AMBULATORY_CARE_PROVIDER_SITE_OTHER): Payer: Medicare Other | Admitting: Internal Medicine

## 2012-05-26 DIAGNOSIS — Z5181 Encounter for therapeutic drug level monitoring: Secondary | ICD-10-CM

## 2012-05-26 DIAGNOSIS — Z7901 Long term (current) use of anticoagulants: Secondary | ICD-10-CM

## 2012-05-26 DIAGNOSIS — I4891 Unspecified atrial fibrillation: Secondary | ICD-10-CM

## 2012-05-26 LAB — POCT INR: INR: 2.3

## 2012-05-26 NOTE — Patient Instructions (Signed)
Continue current dose, check in 4 weeks  

## 2012-06-07 ENCOUNTER — Ambulatory Visit (INDEPENDENT_AMBULATORY_CARE_PROVIDER_SITE_OTHER): Payer: Medicare Other | Admitting: Internal Medicine

## 2012-06-07 ENCOUNTER — Encounter: Payer: Self-pay | Admitting: Internal Medicine

## 2012-06-07 VITALS — BP 110/68 | HR 89 | Temp 97.5°F | Wt 129.0 lb

## 2012-06-07 DIAGNOSIS — T148XXA Other injury of unspecified body region, initial encounter: Secondary | ICD-10-CM

## 2012-06-07 NOTE — Assessment & Plan Note (Signed)
Partial along lateral quadriceps insertion at knee Mild calf pain --probably from fluid/blood down from injured site  Discussed ice/continue the celebrex

## 2012-06-07 NOTE — Progress Notes (Signed)
Subjective:    Patient ID: [name], female    DOB: 25-Aug-1926, 78 y.o.   MRN: [id number]  HPI Driving late yesterday and suddenly felt cramp in lateral left thigh---just above knee Hardly could get out of car or walk Pain and some numbness  Heavy exercise 2 days ago---elliptical and treadmill (usual intensity but had missed a week)  Did try ice packs yesterday Better today Some soreness in popliteal fossa  Current Outpatient Prescriptions on File Prior to Visit  Medication Sig Dispense Refill  . atenolol (TENORMIN) 25 MG tablet TAKE ONE TABLET BY MOUTH EVERY DAY  90 tablet  3  . b complex vitamins tablet Take 1 tablet by mouth daily.        . Biotin 5000 MCG CAPS Take by mouth daily.        . Calcium Carbonate-Vitamin D 600-400 MG-UNIT per tablet Take 2 tablets by mouth daily.        . CELEBREX 200 MG capsule Take 200 mg by mouth daily.       . furosemide (LASIX) 40 MG tablet TAKE ONE TO TWO TABLETS BY MOUTH EVERY DAY  180 tablet  0  . Glucosamine-Chondroit-Calcium 750-600-100 MG TABS Take 2 By mouth once daily       . levothyroxine (SYNTHROID, LEVOTHROID) 75 MCG tablet TAKE ONE TABLET BY MOUTH EVERY DAY  90 tablet  3  . methocarbamol (ROBAXIN) 500 MG tablet Take 500 mg by mouth as needed.       . milk thistle 175 MG tablet Take 175 mg by mouth daily.        . Multiple Vitamins-Minerals (ONE-A-DAY 50 PLUS PO) Take by mouth daily.        . Omega-3 Fatty Acids (FISH OIL) 1200 MG CAPS Take 2 By mouth once daily       . omeprazole (PRILOSEC OTC) 20 MG tablet Take 1 tablet (20 mg total) by mouth daily.  90 tablet  3  . oxyCODONE-acetaminophen (PERCOCET) 10-325 MG per tablet Take 1 tablet by mouth every 4 (four) hours as needed.        . vitamin B-12 (CYANOCOBALAMIN) 1000 MCG tablet Take 1,000 mcg by mouth daily.        Marland Kitchen warfarin (COUMADIN) 5 MG tablet Take 5 mg by mouth daily.          Allergies  Allergen Reactions  . Dabigatran Etexilate Mesylate Shortness Of Breath  .  Penicillins   . Sulfonamide Derivatives     Past Medical History  Diagnosis Date  . Arthritis   . Hyperlipidemia   . Thyroid disease   . Atrial fibrillation     Past Surgical History  Procedure Date  . Rotator cuff repair   . Intraocular lens implant, secondary 1996/ 2010  . Knee surgery     lateral  . Tonsillectomy     Family History  Problem Relation Age of Onset  . Heart disease Mother     History   Social History  . Marital Status: Married    Spouse Name: N/A    Number of Children: N/A  . Years of Education: N/A   Occupational History  . retired    Social History Main Topics  . Smoking status: Former Smoker    Types: Cigarettes  . Smokeless tobacco: Never Used  . Alcohol Use: Yes  . Drug Use: No  . Sexually Active: Not on file   Other Topics Concern  . Not on file   Social  History Narrative   Regular exerciseRemains active with church and community activitiesHas living willHusband, then Jones Skene (friend), is health care POAHas DNR order---form done again 11/11/13No tube feeds if cognitively unaware   Review of Systems No sig ankle or foot edema Chronic venous stasis changes are stable    Objective:   Physical Exam  Constitutional: She appears well-developed and well-nourished. No distress.  Musculoskeletal:       Mild swelling over distal lateral left thigh (at knee) Mild pain with full knee flexion or lateral Macmurray's Mild calf tenderness but not swollen No sig edema          Assessment & Plan:

## 2012-06-13 ENCOUNTER — Encounter: Payer: Self-pay | Admitting: Internal Medicine

## 2012-06-13 ENCOUNTER — Ambulatory Visit (INDEPENDENT_AMBULATORY_CARE_PROVIDER_SITE_OTHER): Payer: Medicare Other | Admitting: Internal Medicine

## 2012-06-13 VITALS — BP 128/78 | HR 86 | Wt 126.0 lb

## 2012-06-13 DIAGNOSIS — R58 Hemorrhage, not elsewhere classified: Secondary | ICD-10-CM

## 2012-06-13 DIAGNOSIS — I998 Other disorder of circulatory system: Secondary | ICD-10-CM

## 2012-06-13 NOTE — Assessment & Plan Note (Signed)
Related to the muscle tear Still has good function Discussed compression and ice (especially after doing exercise)

## 2012-06-13 NOTE — Progress Notes (Signed)
Subjective:    Patient ID: [name], female    DOB: [birth year], 78 y.o.   MRN: [id number]  HPI Left leg is worrying her Now has extensive bruising starting at lateral distal left thigh and some down calf Had tried icing by the damaged area and heat on calf  Did go to exercise class 4 days ago Able to do elliptical and nu-step  Not really painful but has itchy sensation  Current Outpatient Prescriptions on File Prior to Visit  Medication Sig Dispense Refill  . atenolol (TENORMIN) 25 MG tablet TAKE ONE TABLET BY MOUTH EVERY DAY  90 tablet  3  . b complex vitamins tablet Take 1 tablet by mouth daily.        . Biotin 5000 MCG CAPS Take by mouth daily.        . Calcium Carbonate-Vitamin D 600-400 MG-UNIT per tablet Take 2 tablets by mouth daily.        . CELEBREX 200 MG capsule Take 200 mg by mouth daily.       . furosemide (LASIX) 40 MG tablet TAKE ONE TO TWO TABLETS BY MOUTH EVERY DAY  180 tablet  0  . Glucosamine-Chondroit-Calcium 750-600-100 MG TABS Take 2 By mouth once daily       . levothyroxine (SYNTHROID, LEVOTHROID) 75 MCG tablet TAKE ONE TABLET BY MOUTH EVERY DAY  90 tablet  3  . methocarbamol (ROBAXIN) 500 MG tablet Take 500 mg by mouth as needed.       . milk thistle 175 MG tablet Take 175 mg by mouth daily.        . Multiple Vitamins-Minerals (ONE-A-DAY 50 PLUS PO) Take by mouth daily.        . Omega-3 Fatty Acids (FISH OIL) 1200 MG CAPS Take 2 By mouth once daily       . omeprazole (PRILOSEC OTC) 20 MG tablet Take 1 tablet (20 mg total) by mouth daily.  90 tablet  3  . oxyCODONE-acetaminophen (PERCOCET) 10-325 MG per tablet Take 1 tablet by mouth every 4 (four) hours as needed.        . vitamin B-12 (CYANOCOBALAMIN) 1000 MCG tablet Take 1,000 mcg by mouth daily.        Marland Kitchen warfarin (COUMADIN) 5 MG tablet Take 5 mg by mouth daily.          Allergies  Allergen Reactions  . Dabigatran Etexilate Mesylate Shortness Of Breath  . Penicillins   . Sulfonamide Derivatives      Past Medical History  Diagnosis Date  . Arthritis   . Hyperlipidemia   . Thyroid disease   . Atrial fibrillation     Past Surgical History  Procedure Date  . Rotator cuff repair   . Intraocular lens implant, secondary 1996/ 2010  . Knee surgery     lateral  . Tonsillectomy     Family History  Problem Relation Age of Onset  . Heart disease Mother     History   Social History  . Marital Status: Married    Spouse Name: N/A    Number of Children: N/A  . Years of Education: N/A   Occupational History  . retired    Social History Main Topics  . Smoking status: Former Smoker    Types: Cigarettes  . Smokeless tobacco: Never Used  . Alcohol Use: Yes  . Drug Use: No  . Sexually Active: Not on file   Other Topics Concern  . Not on file   Social  History Narrative   Regular exerciseRemains active with church and community activitiesHas living willHusband, then Jones Skene (friend), is health care POAHas DNR order---form done again 11/11/13No tube feeds if cognitively unaware   Review of Systems No leg weakness No sig edema     Objective:   Physical Exam  Musculoskeletal:       No calf tenderness No sig edema  Skin:       Ecchymoses from distal lateral left thigh down the calf          Assessment & Plan:

## 2012-06-22 ENCOUNTER — Ambulatory Visit (INDEPENDENT_AMBULATORY_CARE_PROVIDER_SITE_OTHER): Payer: Medicare Other | Admitting: General Practice

## 2012-06-22 DIAGNOSIS — Z7901 Long term (current) use of anticoagulants: Secondary | ICD-10-CM

## 2012-06-22 DIAGNOSIS — I4891 Unspecified atrial fibrillation: Secondary | ICD-10-CM

## 2012-06-22 DIAGNOSIS — Z5181 Encounter for therapeutic drug level monitoring: Secondary | ICD-10-CM

## 2012-06-22 NOTE — Patient Instructions (Addendum)
Continue to take 1 tablet daily except 1/2 tablet on Thursday.  Re-check in 4 weeks.  

## 2012-06-29 ENCOUNTER — Other Ambulatory Visit: Payer: Self-pay | Admitting: Family Medicine

## 2012-07-11 ENCOUNTER — Other Ambulatory Visit: Payer: Self-pay | Admitting: Internal Medicine

## 2012-07-17 ENCOUNTER — Ambulatory Visit (INDEPENDENT_AMBULATORY_CARE_PROVIDER_SITE_OTHER): Payer: Medicare Other | Admitting: General Practice

## 2012-07-17 DIAGNOSIS — I4891 Unspecified atrial fibrillation: Secondary | ICD-10-CM

## 2012-07-17 DIAGNOSIS — Z7901 Long term (current) use of anticoagulants: Secondary | ICD-10-CM

## 2012-07-17 DIAGNOSIS — Z5181 Encounter for therapeutic drug level monitoring: Secondary | ICD-10-CM

## 2012-07-17 LAB — POCT INR: INR: 2.3

## 2012-07-17 NOTE — Patient Instructions (Addendum)
Continue to take 1 tablet daily except 1/2 tablet on Thursday.  Re-check in 4 weeks.

## 2012-08-10 ENCOUNTER — Other Ambulatory Visit: Payer: Self-pay | Admitting: Orthopaedic Surgery

## 2012-08-10 DIAGNOSIS — M549 Dorsalgia, unspecified: Secondary | ICD-10-CM

## 2012-08-14 ENCOUNTER — Ambulatory Visit (INDEPENDENT_AMBULATORY_CARE_PROVIDER_SITE_OTHER): Payer: Medicare Other | Admitting: General Practice

## 2012-08-14 ENCOUNTER — Ambulatory Visit: Payer: Medicare Other

## 2012-08-14 DIAGNOSIS — Z7901 Long term (current) use of anticoagulants: Secondary | ICD-10-CM

## 2012-08-14 DIAGNOSIS — I4891 Unspecified atrial fibrillation: Secondary | ICD-10-CM

## 2012-08-14 DIAGNOSIS — Z5181 Encounter for therapeutic drug level monitoring: Secondary | ICD-10-CM

## 2012-08-14 NOTE — Patient Instructions (Signed)
Take extra 1/2 tablet today and then continue to take 1 tablet daily except 1/2 tablet on Thursday.  Re-check in 4 weeks.

## 2012-08-16 ENCOUNTER — Ambulatory Visit
Admission: RE | Admit: 2012-08-16 | Discharge: 2012-08-16 | Disposition: A | Discharge status: Home / Self Care | Payer: Medicare Other | Source: Ambulatory Visit | Attending: Orthopaedic Surgery | Admitting: Orthopaedic Surgery

## 2012-08-16 DIAGNOSIS — M549 Dorsalgia, unspecified: Secondary | ICD-10-CM

## 2012-09-11 ENCOUNTER — Ambulatory Visit (INDEPENDENT_AMBULATORY_CARE_PROVIDER_SITE_OTHER): Payer: Medicare Other | Admitting: General Practice

## 2012-09-11 DIAGNOSIS — Z7901 Long term (current) use of anticoagulants: Secondary | ICD-10-CM

## 2012-10-09 ENCOUNTER — Ambulatory Visit (INDEPENDENT_AMBULATORY_CARE_PROVIDER_SITE_OTHER): Payer: Medicare Other | Admitting: General Practice

## 2012-10-09 DIAGNOSIS — Z5181 Encounter for therapeutic drug level monitoring: Secondary | ICD-10-CM

## 2012-10-09 DIAGNOSIS — Z7901 Long term (current) use of anticoagulants: Secondary | ICD-10-CM

## 2012-10-09 DIAGNOSIS — I4891 Unspecified atrial fibrillation: Secondary | ICD-10-CM

## 2012-10-09 NOTE — Patient Instructions (Signed)
Take extra 1/2 tablet today and then continue to take 1 tablet daily except 1/2 tablet on Thursday.  Re-check in 4 weeks.

## 2012-10-10 ENCOUNTER — Other Ambulatory Visit: Payer: Self-pay | Admitting: Internal Medicine

## 2012-10-10 MED ORDER — LEVOTHYROXINE SODIUM 75 MCG PO TABS: 75.0000 ug | ORAL_TABLET | Freq: Every day | ORAL | Status: DC

## 2012-10-10 NOTE — Addendum Note (Signed)
Addended by: Sueanne Margarita on: 10/10/2012 02:32 PM   Modules accepted: Orders

## 2012-10-18 ENCOUNTER — Ambulatory Visit (INDEPENDENT_AMBULATORY_CARE_PROVIDER_SITE_OTHER): Payer: Medicare Other | Admitting: *Deleted

## 2012-10-18 DIAGNOSIS — Z23 Encounter for immunization: Secondary | ICD-10-CM

## 2012-11-05 ENCOUNTER — Other Ambulatory Visit: Payer: Self-pay | Admitting: Internal Medicine

## 2012-11-06 ENCOUNTER — Ambulatory Visit (INDEPENDENT_AMBULATORY_CARE_PROVIDER_SITE_OTHER): Payer: Medicare Other | Admitting: General Practice

## 2012-11-06 ENCOUNTER — Ambulatory Visit: Payer: Medicare Other

## 2012-11-06 DIAGNOSIS — I4891 Unspecified atrial fibrillation: Secondary | ICD-10-CM

## 2012-11-06 DIAGNOSIS — Z5181 Encounter for therapeutic drug level monitoring: Secondary | ICD-10-CM

## 2012-11-06 DIAGNOSIS — Z7901 Long term (current) use of anticoagulants: Secondary | ICD-10-CM

## 2012-11-06 LAB — POCT INR: INR: 2.1

## 2012-11-06 NOTE — Patient Instructions (Signed)
Continue to take 1 tablet daily except 1/2 tablet on Thursday.  Re-check in 4 weeks.

## 2012-11-24 ENCOUNTER — Other Ambulatory Visit: Payer: Self-pay | Admitting: Internal Medicine

## 2012-11-24 NOTE — Telephone Encounter (Signed)
Refilled

## 2012-12-07 ENCOUNTER — Ambulatory Visit (INDEPENDENT_AMBULATORY_CARE_PROVIDER_SITE_OTHER): Payer: Medicare Other | Admitting: Family Medicine

## 2012-12-07 DIAGNOSIS — Z5181 Encounter for therapeutic drug level monitoring: Secondary | ICD-10-CM

## 2012-12-07 DIAGNOSIS — I4891 Unspecified atrial fibrillation: Secondary | ICD-10-CM

## 2012-12-07 DIAGNOSIS — Z7901 Long term (current) use of anticoagulants: Secondary | ICD-10-CM

## 2012-12-07 LAB — POCT INR: INR: 1.7

## 2012-12-07 NOTE — Patient Instructions (Signed)
Take 7.5mg  everyday.  Re-check in 4 weeks.

## 2012-12-08 ENCOUNTER — Other Ambulatory Visit: Payer: Self-pay | Admitting: Internal Medicine

## 2012-12-08 ENCOUNTER — Other Ambulatory Visit: Payer: Self-pay | Admitting: General Practice

## 2012-12-08 MED ORDER — WARFARIN SODIUM 7.5 MG PO TABS: ORAL_TABLET | ORAL | Status: DC

## 2012-12-28 ENCOUNTER — Other Ambulatory Visit: Payer: Self-pay

## 2012-12-28 DIAGNOSIS — Z1231 Encounter for screening mammogram for malignant neoplasm of breast: Secondary | ICD-10-CM

## 2013-01-04 ENCOUNTER — Ambulatory Visit (INDEPENDENT_AMBULATORY_CARE_PROVIDER_SITE_OTHER): Payer: Medicare Other | Admitting: Family Medicine

## 2013-01-04 DIAGNOSIS — Z7901 Long term (current) use of anticoagulants: Secondary | ICD-10-CM

## 2013-01-04 DIAGNOSIS — I4891 Unspecified atrial fibrillation: Secondary | ICD-10-CM

## 2013-01-04 DIAGNOSIS — Z5181 Encounter for therapeutic drug level monitoring: Secondary | ICD-10-CM

## 2013-01-04 NOTE — Patient Instructions (Signed)
Continue to take 7.5mg  everyday.  Re-check in 4 weeks.

## 2013-01-18 ENCOUNTER — Ambulatory Visit
Admission: RE | Admit: 2013-01-18 | Discharge: 2013-01-18 | Disposition: A | Discharge status: Home / Self Care | Payer: Medicare Other | Source: Ambulatory Visit

## 2013-01-18 DIAGNOSIS — Z1231 Encounter for screening mammogram for malignant neoplasm of breast: Secondary | ICD-10-CM

## 2013-01-29 ENCOUNTER — Ambulatory Visit (INDEPENDENT_AMBULATORY_CARE_PROVIDER_SITE_OTHER): Payer: Medicare Other | Admitting: Family Medicine

## 2013-01-29 DIAGNOSIS — Z5181 Encounter for therapeutic drug level monitoring: Secondary | ICD-10-CM

## 2013-01-29 DIAGNOSIS — Z7901 Long term (current) use of anticoagulants: Secondary | ICD-10-CM

## 2013-01-29 DIAGNOSIS — I4891 Unspecified atrial fibrillation: Secondary | ICD-10-CM

## 2013-01-29 NOTE — Patient Instructions (Addendum)
Hold Coumadin today (7/28) and then continue to take 7.5mg  everyday.  Re-check in 3 weeks.

## 2013-02-02 ENCOUNTER — Ambulatory Visit (INDEPENDENT_AMBULATORY_CARE_PROVIDER_SITE_OTHER): Payer: Medicare Other | Admitting: Internal Medicine

## 2013-02-02 ENCOUNTER — Encounter: Payer: Self-pay | Admitting: Internal Medicine

## 2013-02-02 VITALS — BP 138/70 | HR 86 | Temp 98.5°F | Wt 126.0 lb

## 2013-02-02 DIAGNOSIS — I872 Venous insufficiency (chronic) (peripheral): Secondary | ICD-10-CM | POA: Insufficient documentation

## 2013-02-02 NOTE — Assessment & Plan Note (Signed)
Some superficial varicosities and thrombophlebitis Nothing to suggest DVT Discussed salt restriction, elevation She isn't thrilled about the hose--but that would help Increased furosemide would probably not help

## 2013-02-02 NOTE — Progress Notes (Signed)
Subjective:    Patient ID: [name], female    DOB: 1927-06-12, 79 y.o.   MRN: [id number]  HPI Has had some trouble with her legs Skin is very fragile Gets leg swelling in summer when she wears sandals  Now more calf swelling and in her ankles Feels tight but no overt pain Has support hose but hard to put on and wear  Uses the furosemide daily--but has been doubling the dose Edema is better in the AM  Current Outpatient Prescriptions on File Prior to Visit  Medication Sig Dispense Refill  . atenolol (TENORMIN) 25 MG tablet TAKE ONE TABLET BY MOUTH EVERY DAY  90 tablet  2  . b complex vitamins tablet Take 1 tablet by mouth daily.        . Biotin 5000 MCG CAPS Take by mouth daily.        . Calcium Carbonate-Vitamin D 600-400 MG-UNIT per tablet Take 2 tablets by mouth daily.        . CELEBREX 200 MG capsule TAKE ONE CAPSULE BY MOUTH EVERY DAY  30 capsule  3  . furosemide (LASIX) 40 MG tablet TAKE ONE TO TWO TABLETS BY MOUTH EVERY DAY  180 tablet  0  . Glucosamine-Chondroit-Calcium 750-600-100 MG TABS Take 2 By mouth once daily       . levothyroxine (SYNTHROID, LEVOTHROID) 75 MCG tablet Take 1 tablet (75 mcg total) by mouth daily before breakfast.  90 tablet  3  . methocarbamol (ROBAXIN) 500 MG tablet Take 500 mg by mouth as needed.       . milk thistle 175 MG tablet Take 175 mg by mouth daily.        . Multiple Vitamins-Minerals (ONE-A-DAY 50 PLUS PO) Take by mouth daily.        . Omega-3 Fatty Acids (FISH OIL) 1200 MG CAPS Take 2 By mouth once daily       . oxyCODONE-acetaminophen (PERCOCET) 10-325 MG per tablet Take 1 tablet by mouth every 4 (four) hours as needed.        . vitamin B-12 (CYANOCOBALAMIN) 1000 MCG tablet Take 1,000 mcg by mouth daily.        Marland Kitchen warfarin (COUMADIN) 7.5 MG tablet Take as directed by coumadin clinic.  30 tablet  2  . omeprazole (PRILOSEC OTC) 20 MG tablet Take 1 tablet (20 mg total) by mouth daily.  90 tablet  3   No current facility-administered  medications on file prior to visit.    Allergies  Allergen Reactions  . Dabigatran Etexilate Mesylate Shortness Of Breath  . Penicillins   . Sulfonamide Derivatives     Past Medical History  Diagnosis Date  . Arthritis   . Hyperlipidemia   . Thyroid disease   . Atrial fibrillation     Past Surgical History  Procedure Laterality Date  . Rotator cuff repair    . Intraocular lens implant, secondary  1996/ 2010  . Knee surgery      lateral  . Tonsillectomy      Family History  Problem Relation Age of Onset  . Heart disease Mother     History   Social History  . Marital Status: Married    Spouse Name: N/A    Number of Children: N/A  . Years of Education: N/A   Occupational History  . retired    Social History Main Topics  . Smoking status: Former Smoker    Types: Cigarettes  . Smokeless tobacco: Never Used  .  Alcohol Use: Yes  . Drug Use: No  . Sexually Active: Not on file   Other Topics Concern  . Not on file   Social History Narrative   Regular exercise   Remains active with church and community activities      Has living will   Husband, then Jones Skene (friend), is health care POA   Has DNR order---form done again 05/15/12   No tube feeds if cognitively unaware   Review of Systems No chest pain No SOB    Objective:   Physical Exam  Constitutional: She appears well-developed and well-nourished. No distress.  Neck: Normal range of motion. Neck supple. No thyromegaly present.  Cardiovascular: Normal rate and normal heart sounds.  Exam reveals no gallop.   No murmur heard. irregular  Pulmonary/Chest: Effort normal and breath sounds normal. No respiratory distress. She has no wheezes. She has no rales.  Musculoskeletal: She exhibits edema.  Trace ankle edema No calf tenderness Slightly tender superficial vein on medial left calf Extensive stasis dermatitis on entire calves  Lymphadenopathy:    She has no cervical adenopathy.           Assessment & Plan:

## 2013-02-08 ENCOUNTER — Emergency Department: Payer: Self-pay | Admitting: Emergency Medicine

## 2013-02-08 LAB — PROTIME-INR
INR: 2.5
Prothrombin Time: 26.7 secs — ABNORMAL HIGH (ref 11.5–14.7)

## 2013-02-09 ENCOUNTER — Encounter: Payer: Self-pay | Admitting: Family Medicine

## 2013-02-09 ENCOUNTER — Telehealth: Payer: Self-pay

## 2013-02-09 ENCOUNTER — Ambulatory Visit (INDEPENDENT_AMBULATORY_CARE_PROVIDER_SITE_OTHER): Payer: Medicare Other | Admitting: Family Medicine

## 2013-02-09 VITALS — BP 122/66 | HR 96 | Temp 97.7°F | Wt 127.0 lb

## 2013-02-09 DIAGNOSIS — S8011XA Contusion of right lower leg, initial encounter: Secondary | ICD-10-CM

## 2013-02-09 DIAGNOSIS — I4891 Unspecified atrial fibrillation: Secondary | ICD-10-CM

## 2013-02-09 DIAGNOSIS — Z7901 Long term (current) use of anticoagulants: Secondary | ICD-10-CM

## 2013-02-09 DIAGNOSIS — S8010XA Contusion of unspecified lower leg, initial encounter: Secondary | ICD-10-CM

## 2013-02-09 DIAGNOSIS — T148XXA Other injury of unspecified body region, initial encounter: Secondary | ICD-10-CM

## 2013-02-09 DIAGNOSIS — Z5181 Encounter for therapeutic drug level monitoring: Secondary | ICD-10-CM

## 2013-02-09 LAB — POCT INR: INR: 2.6

## 2013-02-09 MED ORDER — OXYCODONE-ACETAMINOPHEN 10-325 MG PO TABS: 0.5000 | ORAL_TABLET | Freq: Four times a day (QID) | ORAL | Status: DC | PRN

## 2013-02-09 NOTE — Telephone Encounter (Signed)
Will see today.  

## 2013-02-09 NOTE — Patient Instructions (Addendum)
Keep the area covered.  Elevate your leg.  Stop the coumadin for now.  Recheck here on Monday if not seen at the would clinic.  See Shirlee Limerick about your referral before you leave today. If bleeding, to ER.

## 2013-02-09 NOTE — Telephone Encounter (Signed)
Pt hit rt shin on corner of coffee table 02/08/13;large hematoma size of orange, pt seen ED [facility] 02/08/13; INR done 2.5 pt was advised not to take Coumadin 7.5 on 02/08/13 and restart on 02/09/13. Pt applying ice to hematoma qid. Pt wants appt today. Pt scheduled Dr Para March today at 3 pm. [facility] ED records requested. No CP or SOB. Pt will call back if condition changes or worsens prior to appt.

## 2013-02-11 DIAGNOSIS — S8010XA Contusion of unspecified lower leg, initial encounter: Secondary | ICD-10-CM | POA: Insufficient documentation

## 2013-02-11 NOTE — Progress Notes (Signed)
On coumadin prev, held for now.  Hit her R shin.  Puffy hematoma in meantime.  Here for eval today.  No other bleeding.  Painful.  Has used oxycodone for pain.    Meds, vitals, and allergies reviewed.   ROS: See HPI.  Otherwise, noncontributory.  nad but uncomfortable.  ~10cm noninfected hematoma/bullous lesion on R shin, it started to spontaneously bleed in the clinic.  This was stopped with minimal pressure.  No bleeding on observation after that.  Surrounded by bulky bandage and then wrapped.  Distally NV intact with normal DP pulse.

## 2013-02-11 NOTE — Assessment & Plan Note (Signed)
Not bleeding on f/u observation in clinic.  Surrounded by bulky bandage and then wrapped. Distally NV intact with normal DP pulse.  Okay for outpatient f/u.  Hold coumadin for now.  INR rechecked in clinic, 2.6.  She'll have either f/u with wound clinic on Monday or back here in clinic.   If bleeding over the weekend, to ER.  She agrees.

## 2013-02-12 ENCOUNTER — Encounter (HOSPITAL_BASED_OUTPATIENT_CLINIC_OR_DEPARTMENT_OTHER): Payer: Medicare Other | Attending: Plastic Surgery

## 2013-02-12 ENCOUNTER — Telehealth: Payer: Self-pay | Admitting: Family Medicine

## 2013-02-12 DIAGNOSIS — I8390 Asymptomatic varicose veins of unspecified lower extremity: Secondary | ICD-10-CM | POA: Insufficient documentation

## 2013-02-12 DIAGNOSIS — L97809 Non-pressure chronic ulcer of other part of unspecified lower leg with unspecified severity: Secondary | ICD-10-CM | POA: Insufficient documentation

## 2013-02-12 NOTE — Telephone Encounter (Signed)
Spoke to Dr. Alphonsus Sias and he said to add patient on at 12:45 tomorrow (Tuesday) to see him. Called patient and advised her that Dr. Alphonsus Sias wants to see her tomorrow at 12:45.

## 2013-02-12 NOTE — Telephone Encounter (Signed)
Pt left vm stating that she was told to hold Coumadin indefinitely on Friday 8/8 during OV with Dr. Para March and that her INR would be checked today at wound clinic but it was not checked while she was there.  She wants to know if she should resume Coumadin.  I can call and have pt come in possibly tomorrow to check INR, if she is in range, ok to resume?

## 2013-02-12 NOTE — Telephone Encounter (Signed)
Get her on my schedule or Dr. Karle Starch schedule tomorrow.  Thanks.

## 2013-02-13 ENCOUNTER — Ambulatory Visit (INDEPENDENT_AMBULATORY_CARE_PROVIDER_SITE_OTHER): Payer: Medicare Other | Admitting: Internal Medicine

## 2013-02-13 ENCOUNTER — Encounter: Payer: Self-pay | Admitting: Internal Medicine

## 2013-02-13 VITALS — BP 120/70 | HR 69 | Temp 98.4°F

## 2013-02-13 DIAGNOSIS — I4891 Unspecified atrial fibrillation: Secondary | ICD-10-CM

## 2013-02-13 DIAGNOSIS — Z5189 Encounter for other specified aftercare: Secondary | ICD-10-CM

## 2013-02-13 DIAGNOSIS — S8012XD Contusion of left lower leg, subsequent encounter: Secondary | ICD-10-CM

## 2013-02-13 NOTE — Progress Notes (Signed)
[facility]  NAME:  SUMMAR, MCGLOTHLIN NO.:  [id number]  MEDICAL RECORD NO.:  [id number]      DATE OF BIRTH:  [birth year]  PHYSICIAN:  Wayland Denis, DO       VISIT DATE:  02/12/2013                                  OFFICE VISIT   HISTORY OF PRESENT ILLNESS:  The patient is an 79 year old female who is here for evaluation of her right lower extremity, traumatic injury with a hematoma.  She accidentally bumped her leg and sustained a large hematoma on the anterior shin area.  She is anticoagulated on Coumadin due to her atrial fibrillation.  She also has some chronic back pain, and very thin skin.  She has had surgery of bilateral total knee replacements and a rotator cuff surgery.  MEDICATIONS:  Include Celebrex, atenolol, Synthroid, furosemide, __________, biotin, B12, Coumadin, calcium, fish oil, multivitamin, B- complex, Flexeril, MiraLax, Colace, and Percocet as needed.  ALLERGIES:  She is allergic to penicillin, sulfa, adhesion, Pradaxa.  SOCIAL HISTORY:  She lives with her husband.  She does not smoke.  PHYSICAL EXAMINATION:  GENERAL:  On exam, she is alert, oriented, cooperative, and not in any acute distress.  She is pleasant. HEENT:  Pupils are equal.  Extraocular muscles are intact. NECK:  She does not have any cervical lymphadenopathy. SKIN:  Her skin is obviously thin.  She has a large hematoma on the anterior shin that is consistent with trauma and that measures 7 x 8.4 x 0.1.  She has vascular insufficiency with peripheral vascular disease, varicose veins.  Her pulses are present and equal bilaterally.  ASSESSMENT:  Anterior hematoma of right leg.  PLAN:  We debrided this, irrigated it with copious amounts of normal saline.  The underlying fat is exposed, but does look healthy.  We will use Silvercel on the area and wrap, and plan to see her back in a week. We will also apply for Apligraf.     Wayland Denis,  DO     CS/MEDQ  D:  02/12/2013  T:  02/13/2013  Job:  [id number]

## 2013-02-13 NOTE — Patient Instructions (Signed)
Please restart the warfarin Set up a protime for Friday 8/15

## 2013-02-13 NOTE — Assessment & Plan Note (Signed)
Will restart the coumadin

## 2013-02-13 NOTE — Progress Notes (Signed)
Subjective:    Patient ID: [name], female    DOB: February 11, 1927, 79 y.o.   MRN: [id number]  HPI Banged foot on edge of coffee table Big hematoma Discussed with Dr Para March  Had it debrided by Dr Helyn Numbers at [facility] wound center No anesthesia---very painful Still hurts Now with dressing on it--due to change it today  Current Outpatient Prescriptions on File Prior to Visit  Medication Sig Dispense Refill  . atenolol (TENORMIN) 25 MG tablet TAKE ONE TABLET BY MOUTH EVERY DAY  90 tablet  2  . b complex vitamins tablet Take 1 tablet by mouth daily.        . Biotin 5000 MCG CAPS Take by mouth daily.        . Calcium Carbonate-Vitamin D 600-400 MG-UNIT per tablet Take 2 tablets by mouth daily.        . CELEBREX 200 MG capsule TAKE ONE CAPSULE BY MOUTH EVERY DAY  30 capsule  3  . furosemide (LASIX) 40 MG tablet TAKE ONE TO TWO TABLETS BY MOUTH EVERY DAY  180 tablet  0  . Glucosamine-Chondroit-Calcium 750-600-100 MG TABS Take 2 By mouth once daily       . levothyroxine (SYNTHROID, LEVOTHROID) 75 MCG tablet Take 1 tablet (75 mcg total) by mouth daily before breakfast.  90 tablet  3  . methocarbamol (ROBAXIN) 500 MG tablet Take 500 mg by mouth as needed.       . milk thistle 175 MG tablet Take 175 mg by mouth daily.        . Multiple Vitamins-Minerals (ONE-A-DAY 50 PLUS PO) Take by mouth daily.        . Omega-3 Fatty Acids (FISH OIL) 1200 MG CAPS Take 2 By mouth once daily       . oxyCODONE-acetaminophen (PERCOCET) 10-325 MG per tablet Take 0.5-1 tablets by mouth every 6 (six) hours as needed for pain.  30 tablet  0  . vitamin B-12 (CYANOCOBALAMIN) 1000 MCG tablet Take 1,000 mcg by mouth daily.        Marland Kitchen warfarin (COUMADIN) 7.5 MG tablet Take as directed by coumadin clinic.  30 tablet  2   No current facility-administered medications on file prior to visit.    Allergies  Allergen Reactions  . Dabigatran Etexilate Mesylate Shortness Of Breath  . Penicillins   . Sulfonamide Derivatives      Past Medical History  Diagnosis Date  . Arthritis   . Hyperlipidemia   . Thyroid disease   . Atrial fibrillation     Past Surgical History  Procedure Laterality Date  . Rotator cuff repair    . Intraocular lens implant, secondary  1996/ 2010  . Knee surgery      lateral  . Tonsillectomy      Family History  Problem Relation Age of Onset  . Heart disease Mother     History   Social History  . Marital Status: Married    Spouse Name: N/A    Number of Children: N/A  . Years of Education: N/A   Occupational History  . retired    Social History Main Topics  . Smoking status: Former Smoker    Types: Cigarettes  . Smokeless tobacco: Never Used  . Alcohol Use: Yes  . Drug Use: No  . Sexually Active: Not on file   Other Topics Concern  . Not on file   Social History Narrative   Regular exercise   Remains active with church and community activities  Has living will   Husband, then Jones Skene (friend), is health care POA   Has DNR order---form done again 05/15/12   No tube feeds if cognitively unaware   Review of Systems Stopped the coumadin after the visit Eating okay     Objective:   Physical Exam  Constitutional: She appears well-developed and well-nourished. No distress.  Skin:  ~7cm ulcer on anterior upper right calf Wound stuck to non adherent dressing and removed without much difficulty Replaced with thick layer of neosporin and gauze---she will replace with non adherent dressing at home (I recommended some neosporin to prevent pulling off the granulation) No signs of infection          Assessment & Plan:

## 2013-02-13 NOTE — Assessment & Plan Note (Signed)
Debrided and looks okay now Discussed dressing to prevent drying out No major compression needed now

## 2013-02-15 ENCOUNTER — Encounter: Payer: Self-pay | Admitting: Radiology

## 2013-02-16 ENCOUNTER — Ambulatory Visit (INDEPENDENT_AMBULATORY_CARE_PROVIDER_SITE_OTHER): Payer: Medicare Other | Admitting: Family Medicine

## 2013-02-16 DIAGNOSIS — Z5181 Encounter for therapeutic drug level monitoring: Secondary | ICD-10-CM

## 2013-02-16 DIAGNOSIS — Z7901 Long term (current) use of anticoagulants: Secondary | ICD-10-CM

## 2013-02-16 DIAGNOSIS — I4891 Unspecified atrial fibrillation: Secondary | ICD-10-CM

## 2013-02-16 LAB — POCT INR: INR: 1.4

## 2013-02-16 NOTE — Patient Instructions (Signed)
Continue to take 7.5mg  everyday.  Re-check in 1 week.

## 2013-02-19 ENCOUNTER — Ambulatory Visit: Payer: Medicare Other

## 2013-02-19 NOTE — Progress Notes (Signed)
[facility]  NAME:  [name], [name] NO.:  [id number]  MEDICAL RECORD NO.:  [id number]      DATE OF BIRTH:  1927-06-30  PHYSICIAN:  Wayland Denis, DO            VISIT DATE:                                  OFFICE VISIT   The patient is a 79 year old female who is here for followup on her right lower extremity ulcer.  She underwent trauma to the leg and got a hematoma.  We have been debriding it and it is looking better healing, but the base of the wound, there is no sign of infection.  She still has some swelling of her leg.  There has been no change in her medications or social history.  On exam, she is alert, oriented, cooperative, not in any acute distress.  She is pleasant.  Pupils are equal.  Extraocular muscles are intact.  She does not have any lymphadenopathy.  Her breathing is unlabored.  Her heart rate is regular.  She has severe vascular varicosities of the lower extremity with varicose veins, but pulses present.  Debridement was done.  We will continue with Silvercel elevation and apply for [place].     Wayland Denis, DO     CS/MEDQ  D:  02/19/2013  T:  02/19/2013  Job:  [id number]

## 2013-02-21 ENCOUNTER — Encounter: Payer: Self-pay | Admitting: Radiology

## 2013-02-22 ENCOUNTER — Ambulatory Visit (INDEPENDENT_AMBULATORY_CARE_PROVIDER_SITE_OTHER): Payer: Medicare Other | Admitting: Family Medicine

## 2013-02-22 ENCOUNTER — Other Ambulatory Visit: Payer: Self-pay | Admitting: *Deleted

## 2013-02-22 DIAGNOSIS — I4891 Unspecified atrial fibrillation: Secondary | ICD-10-CM

## 2013-02-22 DIAGNOSIS — Z5181 Encounter for therapeutic drug level monitoring: Secondary | ICD-10-CM

## 2013-02-22 DIAGNOSIS — Z7901 Long term (current) use of anticoagulants: Secondary | ICD-10-CM

## 2013-02-22 LAB — POCT INR: INR: 3.2

## 2013-02-22 MED ORDER — OMEPRAZOLE MAGNESIUM 20 MG PO TBEC: 20.0000 mg | DELAYED_RELEASE_TABLET | Freq: Every day | ORAL | Status: DC

## 2013-03-07 ENCOUNTER — Encounter (HOSPITAL_BASED_OUTPATIENT_CLINIC_OR_DEPARTMENT_OTHER): Payer: Medicare Other | Attending: Plastic Surgery

## 2013-03-07 DIAGNOSIS — L97809 Non-pressure chronic ulcer of other part of unspecified lower leg with unspecified severity: Secondary | ICD-10-CM | POA: Insufficient documentation

## 2013-03-08 ENCOUNTER — Ambulatory Visit (INDEPENDENT_AMBULATORY_CARE_PROVIDER_SITE_OTHER): Payer: Medicare Other | Admitting: Family Medicine

## 2013-03-08 DIAGNOSIS — I4891 Unspecified atrial fibrillation: Secondary | ICD-10-CM

## 2013-03-08 DIAGNOSIS — Z7901 Long term (current) use of anticoagulants: Secondary | ICD-10-CM

## 2013-03-08 DIAGNOSIS — Z5181 Encounter for therapeutic drug level monitoring: Secondary | ICD-10-CM

## 2013-03-08 NOTE — Patient Instructions (Addendum)
Take 1/2 dose of Coumadin today (9/4) and then continue to take 7.5mg  everyday.  Re-check in 4 weeks.

## 2013-03-09 ENCOUNTER — Other Ambulatory Visit: Payer: Self-pay | Admitting: Internal Medicine

## 2013-03-13 ENCOUNTER — Encounter: Payer: Self-pay | Admitting: Cardiothoracic Surgery

## 2013-03-26 ENCOUNTER — Other Ambulatory Visit: Payer: Self-pay | Admitting: Family Medicine

## 2013-03-26 ENCOUNTER — Other Ambulatory Visit: Payer: Self-pay | Admitting: Internal Medicine

## 2013-04-04 ENCOUNTER — Encounter: Payer: Self-pay | Admitting: Cardiothoracic Surgery

## 2013-04-05 ENCOUNTER — Ambulatory Visit (INDEPENDENT_AMBULATORY_CARE_PROVIDER_SITE_OTHER): Payer: Medicare Other | Admitting: Family Medicine

## 2013-04-05 DIAGNOSIS — Z23 Encounter for immunization: Secondary | ICD-10-CM

## 2013-04-05 DIAGNOSIS — Z5181 Encounter for therapeutic drug level monitoring: Secondary | ICD-10-CM

## 2013-04-05 DIAGNOSIS — Z7901 Long term (current) use of anticoagulants: Secondary | ICD-10-CM

## 2013-04-05 DIAGNOSIS — I4891 Unspecified atrial fibrillation: Secondary | ICD-10-CM

## 2013-04-05 LAB — POCT INR: INR: 2.7

## 2013-04-05 NOTE — Patient Instructions (Signed)
Continue to take 7.5mg everyday.  Re-check in 4 weeks. 

## 2013-04-07 ENCOUNTER — Other Ambulatory Visit: Payer: Self-pay | Admitting: Internal Medicine

## 2013-04-24 ENCOUNTER — Encounter: Payer: Self-pay | Admitting: Surgery

## 2013-05-03 ENCOUNTER — Ambulatory Visit (INDEPENDENT_AMBULATORY_CARE_PROVIDER_SITE_OTHER): Payer: Medicare Other | Admitting: Family Medicine

## 2013-05-03 DIAGNOSIS — I4891 Unspecified atrial fibrillation: Secondary | ICD-10-CM

## 2013-05-03 DIAGNOSIS — Z7901 Long term (current) use of anticoagulants: Secondary | ICD-10-CM

## 2013-05-03 DIAGNOSIS — Z5181 Encounter for therapeutic drug level monitoring: Secondary | ICD-10-CM

## 2013-05-03 LAB — POCT INR: INR: 3

## 2013-05-03 NOTE — Patient Instructions (Signed)
Continue to take 7.5mg  everyday.  Re-check in 4 weeks.

## 2013-05-05 ENCOUNTER — Encounter: Payer: Self-pay | Admitting: Surgery

## 2013-05-05 ENCOUNTER — Encounter: Payer: Self-pay | Admitting: Cardiothoracic Surgery

## 2013-05-23 ENCOUNTER — Other Ambulatory Visit: Payer: Self-pay | Admitting: Internal Medicine

## 2013-05-28 ENCOUNTER — Ambulatory Visit (INDEPENDENT_AMBULATORY_CARE_PROVIDER_SITE_OTHER): Payer: Medicare Other | Admitting: Family Medicine

## 2013-05-28 DIAGNOSIS — I4891 Unspecified atrial fibrillation: Secondary | ICD-10-CM

## 2013-05-28 DIAGNOSIS — Z5181 Encounter for therapeutic drug level monitoring: Secondary | ICD-10-CM

## 2013-05-28 DIAGNOSIS — Z7901 Long term (current) use of anticoagulants: Secondary | ICD-10-CM

## 2013-05-28 LAB — POCT INR
INR: 6.2
INR: 7.1

## 2013-05-28 LAB — PROTIME-INR: INR: 7.1 ratio (ref 0.8–1.0)

## 2013-05-28 NOTE — Patient Instructions (Signed)
Pt sent to lab after Coagucheck machine resulted 6.2.  Stat INR draw result 7.1.  Instructed pt to hold Coumadin until Wednesday, increase intake of foods rich in vit K.  Instructed pt that if she begins to bleed and is unable to stop, call 911.  Recheck on Wednesday.

## 2013-05-30 ENCOUNTER — Ambulatory Visit (INDEPENDENT_AMBULATORY_CARE_PROVIDER_SITE_OTHER): Payer: Medicare Other | Admitting: Family Medicine

## 2013-05-30 ENCOUNTER — Ambulatory Visit: Payer: Medicare Other

## 2013-05-30 ENCOUNTER — Encounter: Payer: Self-pay | Admitting: Family Medicine

## 2013-05-30 VITALS — BP 120/70 | HR 97 | Temp 97.6°F | Wt 125.8 lb

## 2013-05-30 DIAGNOSIS — I4891 Unspecified atrial fibrillation: Secondary | ICD-10-CM

## 2013-05-30 DIAGNOSIS — Z5181 Encounter for therapeutic drug level monitoring: Secondary | ICD-10-CM

## 2013-05-30 DIAGNOSIS — L299 Pruritus, unspecified: Secondary | ICD-10-CM

## 2013-05-30 DIAGNOSIS — L988 Other specified disorders of the skin and subcutaneous tissue: Secondary | ICD-10-CM

## 2013-05-30 DIAGNOSIS — R21 Rash and other nonspecific skin eruption: Secondary | ICD-10-CM

## 2013-05-30 DIAGNOSIS — L908 Other atrophic disorders of skin: Secondary | ICD-10-CM

## 2013-05-30 DIAGNOSIS — Z7901 Long term (current) use of anticoagulants: Secondary | ICD-10-CM

## 2013-05-30 LAB — POCT INR: INR: 1.6

## 2013-05-30 MED ORDER — TRIAMCINOLONE ACETONIDE 0.1 % EX CREA: 1.0000 "application " | TOPICAL_CREAM | Freq: Two times a day (BID) | CUTANEOUS | Status: DC

## 2013-05-30 NOTE — Progress Notes (Signed)
Pre-visit discussion using our clinic review tool. No additional management support is needed unless otherwise documented below in the visit note.  

## 2013-05-30 NOTE — Patient Instructions (Signed)
Begin taking Coumadin 3.75mg  (1/2 tablet) daily until Monday.  Recheck on Monday.

## 2013-05-30 NOTE — Patient Instructions (Addendum)
REFERRAL: GO THE THE FRONT ROOM AT THE ENTRANCE OF OUR CLINIC, NEAR CHECK IN. ASK FOR MARION. SHE WILL HELP YOU SET UP YOUR REFERRAL. DATE: TIME:    Take Zyrtec or Allegra tablet in the AM Then take Benadryl 25 mg, 1-2 tablets at night. (START OFF WITH ONE)

## 2013-05-30 NOTE — Progress Notes (Signed)
Date:  05/30/2013   Name:  [name]   DOB:  June 27, 1927   MRN:  [id number] Gender: female Age: 79 y.o.  Primary Physician:  Tillman Abide, MD   Chief Complaint: Rash   Subjective:   History of Present Illness:  [name] is a 79 y.o. pleasant patient who presents with the following:  Pleasant patient who I have known for a number of years who is also one of my patients lives. She presents with severe itching, mostly on her legs, but she also has a breakout on her lateral hip. She had a hematoma of her right leg in July of 2014. Since then she has had some difficulties, she is seen wound care, and gradually healed this. Over the course of the last few daily she has had some severe, tremendous itching. She has not known any new exposures. No new medications. She does take Percocet, but she has been doing so for multiple years. She also takes some Celebrex, but she has been doing that for a number of years as well, and she also takes Coumadin.  Patient Active Problem List   Diagnosis Date Noted  . Hematoma of leg 02/11/2013  . Chronic venous insufficiency 02/02/2013  . Routine general medical examination at a health care facility 05/15/2012  . Other and unspecified hyperlipidemia 09/30/2010  . Osteoarthrosis involving more than one site 09/30/2010  . UNSPECIFIED HYPOTHYROIDISM 04/28/2009  . ATRIAL FIBRILLATION 04/28/2009    Past Medical History  Diagnosis Date  . Arthritis   . Hyperlipidemia   . Thyroid disease   . Atrial fibrillation     Past Surgical History  Procedure Laterality Date  . Rotator cuff repair    . Intraocular lens implant, secondary  1996/ 2010  . Knee surgery      lateral  . Tonsillectomy      History   Social History  . Marital Status: Married    Spouse Name: N/A    Number of Children: N/A  . Years of Education: N/A   Occupational History  . retired    Social History Main Topics  . Smoking status: Former Smoker    Types:  Cigarettes  . Smokeless tobacco: Never Used  . Alcohol Use: Yes  . Drug Use: No  . Sexual Activity: Not on file   Other Topics Concern  . Not on file   Social History Narrative   Regular exercise   Remains active with church and community activities      Has living will   Husband, then Jones Skene (friend), is health care POA   Has DNR order---form done again 05/15/12   No tube feeds if cognitively unaware    Family History  Problem Relation Age of Onset  . Heart disease Mother     Allergies  Allergen Reactions  . Dabigatran Etexilate Mesylate Shortness Of Breath  . Penicillins   . Sulfonamide Derivatives     Medication list has been reviewed and updated.  Review of Systems:   GEN: No acute illnesses, no fevers, chills. GI: No n/v/d, eating normally Pulm: No SOB Interactive and getting along well at home.  Otherwise, ROS is as per the HPI.  Objective:   Physical Examination: BP 120/70  Pulse 97  Temp(Src) 97.6 F (36.4 C) (Oral)  Wt 125 lb 12 oz (57.04 kg)  Ideal Body Weight:     GEN: WDWN, NAD, Non-toxic, A & O x 3 HEENT: Atraumatic, Normocephalic. Neck supple. No masses, No  LAD. Ears and Nose: No external deformity. PSYCH: Normally interactive. Conversant. Not depressed or anxious appearing.  Calm demeanor.   Skin: Right lower extremity with dramatic venous stasis changes, purplish coloration to the skin with a significant degree of maceration. There also 2 or 3 small open wound on the anterior aspect of her leg. She states that all these are quite itchy. None of them appear to be red or warm or significantly tender. She also shows me a place or lateral hip on the same side that is somewhat vesicular in nature but also mildly reddish.  Laboratory and Imaging Data:  Assessment & Plan:    Rash and nonspecific skin eruption - Plan: Ambulatory referral to Dermatology  Skin maceration  Itching  Skin breakdown  Skin eruptions of unclear  etiology. Tried to reassure the patient. I really do not think that this is coming from her Percocet.  Triamcinolone 0.1% cream twice a day. Zyrtec and then Benadryl at nighttime.  The patient was distraught, and I really am unclear about this diagnosis. We called her dermatologist to see if he can see her, and thankfully he was able to see her later today.  Patient Instructions  REFERRAL: GO THE THE FRONT ROOM AT THE ENTRANCE OF OUR CLINIC, NEAR CHECK IN. ASK FOR MARION. SHE WILL HELP YOU SET UP YOUR REFERRAL. DATE: TIME:    Take Zyrtec or Allegra tablet in the AM Then take Benadryl 25 mg, 1-2 tablets at night. (START OFF WITH ONE)   Orders Today:  Orders Placed This Encounter  Procedures  . Ambulatory referral to Dermatology    New medications, updates to list, dose adjustments: Meds ordered this encounter  Medications  . triamcinolone cream (KENALOG) 0.1 %    Sig: Apply 1 application topically 2 (two) times daily.    Dispense:  454 g    Refill:  1    Signed,  Debroh Sieloff T. Erick Murin, MD, CAQ Sports Medicine  [facility] [address] [place] [address] Phone: [phone] Fax: [phone]  Updated Complete Medication List:   Medication List       This list is accurate as of: 05/30/13  5:50 PM.  Always use your most recent med list.               atenolol 25 MG tablet  Commonly known as:  TENORMIN  TAKE ONE TABLET BY MOUTH EVERY DAY     b complex vitamins tablet  Take 1 tablet by mouth daily.     Biotin 5000 MCG Caps  Take by mouth daily.     Calcium Carbonate-Vitamin D 600-400 MG-UNIT per tablet  Take 2 tablets by mouth daily.     CELEBREX 200 MG capsule  Generic drug:  celecoxib  TAKE ONE CAPSULE BY MOUTH ONCE DAILY     Fish Oil 1200 MG Caps  Take 2 By mouth once daily     furosemide 40 MG tablet  Commonly known as:  LASIX  TAKE ONE TO TWO TABLETS BY MOUTH ONCE DAILY     Glucosamine-Chondroit-Calcium 750-600-100  MG Tabs  Take 2 By mouth once daily     levothyroxine 75 MCG tablet  Commonly known as:  SYNTHROID, LEVOTHROID  Take 1 tablet (75 mcg total) by mouth daily before breakfast.     methocarbamol 500 MG tablet  Commonly known as:  ROBAXIN  Take 500 mg by mouth as needed.     milk thistle 175 MG tablet  Take  175 mg by mouth daily.     omeprazole 20 MG tablet  Commonly known as:  PRILOSEC OTC  Take 1 tablet (20 mg total) by mouth daily.     ONE-A-DAY 50 PLUS PO  Take by mouth daily.     oxyCODONE-acetaminophen 10-325 MG per tablet  Commonly known as:  PERCOCET  Take 0.5-1 tablets by mouth every 6 (six) hours as needed for pain.     triamcinolone cream 0.1 %  Commonly known as:  KENALOG  Apply 1 application topically 2 (two) times daily.     vitamin B-12 1000 MCG tablet  Commonly known as:  CYANOCOBALAMIN  Take 1,000 mcg by mouth daily.     warfarin 7.5 MG tablet  Commonly known as:  COUMADIN  TAKE AS DIRECTED BY  COUMADIN  CLINIC

## 2013-06-04 ENCOUNTER — Encounter: Payer: Self-pay | Admitting: Cardiothoracic Surgery

## 2013-06-04 ENCOUNTER — Ambulatory Visit (INDEPENDENT_AMBULATORY_CARE_PROVIDER_SITE_OTHER): Payer: Medicare Other | Admitting: Family Medicine

## 2013-06-04 ENCOUNTER — Encounter: Payer: Self-pay | Admitting: Surgery

## 2013-06-04 DIAGNOSIS — Z7901 Long term (current) use of anticoagulants: Secondary | ICD-10-CM

## 2013-06-04 DIAGNOSIS — Z5181 Encounter for therapeutic drug level monitoring: Secondary | ICD-10-CM

## 2013-06-04 DIAGNOSIS — I4891 Unspecified atrial fibrillation: Secondary | ICD-10-CM

## 2013-06-18 ENCOUNTER — Ambulatory Visit (INDEPENDENT_AMBULATORY_CARE_PROVIDER_SITE_OTHER): Payer: Medicare Other | Admitting: Family Medicine

## 2013-06-18 ENCOUNTER — Other Ambulatory Visit: Payer: Self-pay | Admitting: Internal Medicine

## 2013-06-18 DIAGNOSIS — Z5181 Encounter for therapeutic drug level monitoring: Secondary | ICD-10-CM

## 2013-06-18 DIAGNOSIS — Z7901 Long term (current) use of anticoagulants: Secondary | ICD-10-CM

## 2013-06-18 DIAGNOSIS — I4891 Unspecified atrial fibrillation: Secondary | ICD-10-CM

## 2013-06-18 LAB — POCT INR: INR: 2.4

## 2013-06-23 ENCOUNTER — Other Ambulatory Visit: Payer: Self-pay | Admitting: Internal Medicine

## 2013-07-03 ENCOUNTER — Other Ambulatory Visit: Payer: Self-pay | Admitting: Internal Medicine

## 2013-07-04 ENCOUNTER — Other Ambulatory Visit: Payer: Self-pay | Admitting: Internal Medicine

## 2013-07-16 ENCOUNTER — Ambulatory Visit (INDEPENDENT_AMBULATORY_CARE_PROVIDER_SITE_OTHER): Payer: Medicare Other | Admitting: Family Medicine

## 2013-07-16 DIAGNOSIS — Z5181 Encounter for therapeutic drug level monitoring: Secondary | ICD-10-CM

## 2013-07-16 DIAGNOSIS — I4891 Unspecified atrial fibrillation: Secondary | ICD-10-CM

## 2013-07-16 DIAGNOSIS — Z7901 Long term (current) use of anticoagulants: Secondary | ICD-10-CM

## 2013-07-16 LAB — POCT INR: INR: 3.4

## 2013-07-27 ENCOUNTER — Other Ambulatory Visit: Payer: Self-pay | Admitting: Internal Medicine

## 2013-08-09 ENCOUNTER — Telehealth: Payer: Self-pay | Admitting: *Deleted

## 2013-08-09 NOTE — Telephone Encounter (Signed)
Prior auth form for Celebrex received and sent back electronically to covermymeds, waiting on response

## 2013-08-16 ENCOUNTER — Ambulatory Visit (INDEPENDENT_AMBULATORY_CARE_PROVIDER_SITE_OTHER): Payer: Medicare Other | Admitting: Internal Medicine

## 2013-08-16 ENCOUNTER — Ambulatory Visit (INDEPENDENT_AMBULATORY_CARE_PROVIDER_SITE_OTHER): Payer: Medicare Other | Admitting: Family Medicine

## 2013-08-16 ENCOUNTER — Encounter: Payer: Self-pay | Admitting: Internal Medicine

## 2013-08-16 VITALS — BP 120/70 | HR 71 | Temp 98.2°F | Wt 122.0 lb

## 2013-08-16 DIAGNOSIS — M159 Polyosteoarthritis, unspecified: Secondary | ICD-10-CM

## 2013-08-16 DIAGNOSIS — I4891 Unspecified atrial fibrillation: Secondary | ICD-10-CM

## 2013-08-16 DIAGNOSIS — Z5181 Encounter for therapeutic drug level monitoring: Secondary | ICD-10-CM

## 2013-08-16 DIAGNOSIS — E039 Hypothyroidism, unspecified: Secondary | ICD-10-CM

## 2013-08-16 DIAGNOSIS — Z7901 Long term (current) use of anticoagulants: Secondary | ICD-10-CM

## 2013-08-16 LAB — POCT INR: INR: 1.9

## 2013-08-16 MED ORDER — CELECOXIB 200 MG PO CAPS
200.0000 mg | ORAL_CAPSULE | Freq: Every day | ORAL | Status: DC
Start: 2013-08-16 — End: 2013-12-12

## 2013-08-16 MED ORDER — RIVAROXABAN 15 MG PO TABS: 15.0000 mg | ORAL_TABLET | Freq: Every day | ORAL | Status: DC

## 2013-08-16 NOTE — Assessment & Plan Note (Signed)
Seems to be euthyroid Will check labs 

## 2013-08-16 NOTE — Progress Notes (Signed)
Subjective:    Patient ID: [name], female    DOB: Jan 25, 1927, 80 y.o.   MRN: [id number]  HPI Here for follow up Doing well Leg has healed up completely---done with wound center in December Wonders if she can stop some of her pills---discussed trying off the glucosamine or fish oil---she uses them for arthritis pain  Some stress with caregiver being away She gets frustrated by demanding husband Still active with meals on wheels, master gardening, etc  No chest pain No palpitations No dizziness or syncope BP does go down at times--- is careful with hydration when exercising  Hair and nails are okay Stays on biotin for this Normal activity levels  Current Outpatient Prescriptions on File Prior to Visit  Medication Sig Dispense Refill  . atenolol (TENORMIN) 25 MG tablet TAKE ONE TABLET BY MOUTH ONCE DAILY  90 tablet  0  . b complex vitamins tablet Take 1 tablet by mouth daily.        . Biotin 5000 MCG CAPS Take by mouth daily.        . Calcium Carbonate-Vitamin D 600-400 MG-UNIT per tablet Take 2 tablets by mouth daily.        . celecoxib (CELEBREX) 200 MG capsule TAKE ONE CAPSULE BY MOUTH ONCE DAILY  30 capsule  1  . furosemide (LASIX) 40 MG tablet TAKE ONE TO TWO TABLETS BY MOUTH ONCE DAILY  180 tablet  0  . Glucosamine-Chondroit-Calcium 750-600-100 MG TABS Take 2 By mouth once daily       . levothyroxine (SYNTHROID, LEVOTHROID) 75 MCG tablet Take 1 tablet (75 mcg total) by mouth daily before breakfast.  90 tablet  3  . milk thistle 175 MG tablet Take 175 mg by mouth daily.        . Multiple Vitamins-Minerals (ONE-A-DAY 50 PLUS PO) Take by mouth daily.        . Omega-3 Fatty Acids (FISH OIL) 1200 MG CAPS Take 2 By mouth once daily       . omeprazole (PRILOSEC OTC) 20 MG tablet Take 1 tablet (20 mg total) by mouth daily.  90 tablet  3  . oxyCODONE-acetaminophen (PERCOCET) 10-325 MG per tablet Take 0.5-1 tablets by mouth every 6 (six) hours as needed for pain.  30 tablet  0   . triamcinolone cream (KENALOG) 0.1 % Apply 1 application topically 2 (two) times daily.  454 g  1  . vitamin B-12 (CYANOCOBALAMIN) 1000 MCG tablet Take 1,000 mcg by mouth daily.        Marland Kitchen warfarin (COUMADIN) 7.5 MG tablet TAKE AS DIRECTED BY COUMADIN CLINIC  30 tablet  2   No current facility-administered medications on file prior to visit.    Allergies  Allergen Reactions  . Dabigatran Etexilate Mesylate Shortness Of Breath  . Penicillins   . Sulfonamide Derivatives     Past Medical History  Diagnosis Date  . Arthritis   . Hyperlipidemia   . Thyroid disease   . Atrial fibrillation     Past Surgical History  Procedure Laterality Date  . Rotator cuff repair    . Intraocular lens implant, secondary  1996/ 2010  . Knee surgery      lateral  . Tonsillectomy      Family History  Problem Relation Age of Onset  . Heart disease Mother     History   Social History  . Marital Status: Married    Spouse Name: N/A    Number of Children: N/A  .  Years of Education: N/A   Occupational History  . retired    Social History Main Topics  . Smoking status: Former Smoker    Types: Cigarettes  . Smokeless tobacco: Never Used  . Alcohol Use: Yes  . Drug Use: No  . Sexual Activity: Not on file   Other Topics Concern  . Not on file   Social History Narrative   Regular exercise   Remains active with church and community activities      Has living will   Husband, then Barton Fanny (friend), is health care POA   Has DNR order---form done again 05/15/12   No tube feeds if cognitively unaware    Review of Systems Has lost 3#--- discussed adequate caloric intake Eats healthy Sleeps fine in general     Objective:   Physical Exam  Constitutional: She appears well-developed and well-nourished. No distress.  Neck: Normal range of motion. Neck supple. No thyromegaly present.  Cardiovascular: Normal rate and normal heart sounds.  Exam reveals no gallop.   No murmur  heard. irregular  Pulmonary/Chest: Effort normal and breath sounds normal. No respiratory distress. She has no wheezes. She has no rales.  Abdominal: Soft. Bowel sounds are normal. There is no tenderness.  Musculoskeletal: She exhibits no edema and no tenderness.  Lymphadenopathy:    She has no cervical adenopathy.  Psychiatric: She has a normal mood and affect. Her behavior is normal.          Assessment & Plan:

## 2013-08-16 NOTE — Patient Instructions (Signed)
Please stop the coumadin tonight. Start the [place] with dinner tonight.

## 2013-08-16 NOTE — Assessment & Plan Note (Signed)
She would like to try newer agent Will switch to rivaroxaban 15mg  with dinner

## 2013-08-16 NOTE — Progress Notes (Signed)
Pre-visit discussion using our clinic review tool. No additional management support is needed unless otherwise documented below in the visit note.  

## 2013-08-16 NOTE — Assessment & Plan Note (Signed)
Still on celebrex and supplements

## 2013-08-17 LAB — TSH: TSH: 1.53 u[IU]/mL (ref 0.35–5.50)

## 2013-08-17 LAB — CBC WITH DIFFERENTIAL/PLATELET
Basophils Absolute: 0.1 10*3/uL (ref 0.0–0.1)
Basophils Relative: 1.1 % (ref 0.0–3.0)
EOS PCT: 0 % (ref 0.0–5.0)
Eosinophils Absolute: 0 10*3/uL (ref 0.0–0.7)
HEMATOCRIT: 37.2 % (ref 36.0–46.0)
Hemoglobin: 12.1 g/dL (ref 12.0–15.0)
Lymphocytes Relative: 17.7 % (ref 12.0–46.0)
Lymphs Abs: 1.4 10*3/uL (ref 0.7–4.0)
MCHC: 32.5 g/dL (ref 30.0–36.0)
MCV: 94 fl (ref 78.0–100.0)
MONO ABS: 0.7 10*3/uL (ref 0.1–1.0)
Monocytes Relative: 8.6 % (ref 3.0–12.0)
NEUTROS PCT: 72.6 % (ref 43.0–77.0)
Neutro Abs: 5.8 10*3/uL (ref 1.4–7.7)
Platelets: 303 10*3/uL (ref 150.0–400.0)
RBC: 3.96 Mil/uL (ref 3.87–5.11)
RDW: 16.4 % — ABNORMAL HIGH (ref 11.5–14.6)
WBC: 8 10*3/uL (ref 4.5–10.5)

## 2013-08-17 LAB — T4, FREE: Free T4: 1.36 ng/dL (ref 0.60–1.60)

## 2013-08-17 LAB — COMPREHENSIVE METABOLIC PANEL
ALBUMIN: 4.6 g/dL (ref 3.5–5.2)
ALK PHOS: 71 U/L (ref 39–117)
ALT: 25 U/L (ref 0–35)
AST: 33 U/L (ref 0–37)
BILIRUBIN TOTAL: 1.2 mg/dL (ref 0.3–1.2)
BUN: 24 mg/dL — AB (ref 6–23)
CO2: 20 mEq/L (ref 19–32)
Calcium: 9.7 mg/dL (ref 8.4–10.5)
Chloride: 109 mEq/L (ref 96–112)
Creatinine, Ser: 1 mg/dL (ref 0.4–1.2)
GFR: 58.74 mL/min — ABNORMAL LOW (ref >60.00)
GLUCOSE: 92 mg/dL (ref 70–99)
Potassium: 4.3 mEq/L (ref 3.5–5.1)
Sodium: 142 mEq/L (ref 135–145)
Total Protein: 7.5 g/dL (ref 6.0–8.3)

## 2013-09-13 ENCOUNTER — Ambulatory Visit: Payer: Medicare Other

## 2013-10-08 ENCOUNTER — Other Ambulatory Visit: Payer: Self-pay | Admitting: Internal Medicine

## 2013-10-24 ENCOUNTER — Ambulatory Visit (INDEPENDENT_AMBULATORY_CARE_PROVIDER_SITE_OTHER): Payer: Medicare Other

## 2013-10-24 ENCOUNTER — Ambulatory Visit (INDEPENDENT_AMBULATORY_CARE_PROVIDER_SITE_OTHER): Payer: Medicare Other | Admitting: Podiatry

## 2013-10-24 ENCOUNTER — Encounter: Payer: Self-pay | Admitting: Podiatry

## 2013-10-24 VITALS — Resp 16 | Ht 66.0 in | Wt 123.0 lb

## 2013-10-24 DIAGNOSIS — M79673 Pain in unspecified foot: Secondary | ICD-10-CM

## 2013-10-24 DIAGNOSIS — M2040 Other hammer toe(s) (acquired), unspecified foot: Secondary | ICD-10-CM

## 2013-10-24 DIAGNOSIS — M779 Enthesopathy, unspecified: Secondary | ICD-10-CM

## 2013-10-24 DIAGNOSIS — M79609 Pain in unspecified limb: Secondary | ICD-10-CM

## 2013-10-24 NOTE — Progress Notes (Signed)
She presents complaining of third toes the left foot being painful today it bothers her with shoe gear.  Objective: Vital signs are stable she is alert and oriented x3. Pulses are palpable bilateral. Hammertoe deformity and hallux abductovalgus deformities noted bilateral. Overlying reactive hyperkeratosis and bursitis with capsulitis of the third toe left foot.  Assessment: Capsulitis third digit left foot hammertoe.  Plan: Injected dexamethasone into the joint today third digit left foot. Followup with her as needed

## 2013-11-19 ENCOUNTER — Telehealth: Payer: Self-pay | Admitting: Internal Medicine

## 2013-11-19 NOTE — Telephone Encounter (Signed)
Let her know there is no blood work to check about the [place]. Easy bruising is expected If she has blood in urine, stool, sig nosebleed, etc--that may require a change

## 2013-11-19 NOTE — Telephone Encounter (Signed)
Patient Information:  Caller Name: Marleta  Phone: [phone]  Patient: [name]  Gender: Female  DOB: Oct 13, 1926  Age: 80 Years  PCP: Viviana Simpler [facility])  Office Follow Up:  Does the office need to follow up with this patient?: Yes  Instructions For The Office: please f/u with pt concerning side effects to medication Xarelto, thank you.   Symptoms  Reason For Call & Symptoms: Pt reports she is on Xarelto and having bruising. Pt asking if this is normal with the medication. Pt states she is taking her spouse to the office now for an appt. and will ask at that time if she needs to have blood work done.  Reviewed Health History In EMR: Yes  Reviewed Medications In EMR: Yes  Reviewed Allergies In EMR: Yes  Reviewed Surgeries / Procedures: Yes  Date of Onset of Symptoms: 11/19/2013  Guideline(s) Used:  No Protocol Available - Information Only  Disposition Per Guideline:   Discuss with PCP and Callback by Nurse Today  Reason For Disposition Reached:   Nursing judgment  Advice Given:  Call Back If:  New symptoms develop  You become worse.  Patient Will Follow Care Advice:  YES

## 2013-11-20 NOTE — Telephone Encounter (Signed)
Spoke with patient and advised results   

## 2013-12-12 ENCOUNTER — Encounter: Payer: Self-pay | Admitting: Family Medicine

## 2013-12-12 ENCOUNTER — Ambulatory Visit (INDEPENDENT_AMBULATORY_CARE_PROVIDER_SITE_OTHER): Payer: Medicare Other | Admitting: Family Medicine

## 2013-12-12 ENCOUNTER — Telehealth: Payer: Self-pay | Admitting: Internal Medicine

## 2013-12-12 VITALS — BP 122/76 | HR 70 | Temp 97.3°F | Wt 126.0 lb

## 2013-12-12 DIAGNOSIS — R58 Hemorrhage, not elsewhere classified: Secondary | ICD-10-CM

## 2013-12-12 DIAGNOSIS — H1130 Conjunctival hemorrhage, unspecified eye: Secondary | ICD-10-CM

## 2013-12-12 DIAGNOSIS — I998 Other disorder of circulatory system: Secondary | ICD-10-CM

## 2013-12-12 DIAGNOSIS — H1131 Conjunctival hemorrhage, right eye: Secondary | ICD-10-CM

## 2013-12-12 DIAGNOSIS — I4891 Unspecified atrial fibrillation: Secondary | ICD-10-CM

## 2013-12-12 LAB — CBC WITH DIFFERENTIAL/PLATELET
BASOS PCT: 1.2 % (ref 0.0–3.0)
Basophils Absolute: 0.1 10*3/uL (ref 0.0–0.1)
EOS PCT: 0 % (ref 0.0–5.0)
Eosinophils Absolute: 0 10*3/uL (ref 0.0–0.7)
HCT: 30.8 % — ABNORMAL LOW (ref 36.0–46.0)
HEMOGLOBIN: 10.1 g/dL — AB (ref 12.0–15.0)
LYMPHS ABS: 1 10*3/uL (ref 0.7–4.0)
Lymphocytes Relative: 19.3 % (ref 12.0–46.0)
MCHC: 33 g/dL (ref 30.0–36.0)
MCV: 86.3 fl (ref 78.0–100.0)
MONO ABS: 0.5 10*3/uL (ref 0.1–1.0)
MONOS PCT: 10.8 % (ref 3.0–12.0)
NEUTROS ABS: 3.5 10*3/uL (ref 1.4–7.7)
Neutrophils Relative %: 68.7 % (ref 43.0–77.0)
Platelets: 297 10*3/uL (ref 150.0–400.0)
RBC: 3.57 Mil/uL — AB (ref 3.87–5.11)
RDW: 16.1 % — ABNORMAL HIGH (ref 11.5–15.5)
WBC: 5 10*3/uL (ref 4.0–10.5)

## 2013-12-12 MED ORDER — CELECOXIB 100 MG PO CAPS: 100.0000 mg | ORAL_CAPSULE | Freq: Every day | ORAL | Status: DC

## 2013-12-12 NOTE — Progress Notes (Signed)
BP 122/76  Pulse 70  Temp(Src) 97.3 F (36.3 C) (Oral)  Wt 126 lb (57.153 kg)  SpO2 98%   CC: red spot in eye  Subjective:    Patient ID: [name], female    DOB: [birth year], 80 y.o.   MRN: [id number]  HPI: [name] is a 80 y.o. female presenting on 12/12/2013 for Eye Problem   Woke up this morning with noted small bleed on inside of L eye. No vision changes or headaches. Has lens implants bilateral eyes, wears contacts in L eye for astigmatism but not R eye. Yesterday did see floater which resolved quickly. No photopsia. bp stable, no h/o HTN. May have been coughing more, does have significant family stress (husband with hip fracture, living at [facility]).  Also would like some skin spots checked - one L cheek, one right upper arm. Denies inciting trauma/injury.  On xarelto for afib - on this for last few months since 08/2013, on celebrex for arthritis for last few years.  Off fish oil recently.  Relevant past medical, surgical, family and social history reviewed and updated as indicated.  Allergies and medications reviewed and updated. Current Outpatient Prescriptions on File Prior to Visit  Medication Sig  . atenolol (TENORMIN) 25 MG tablet TAKE ONE TABLET BY MOUTH ONCE DAILY  . b complex vitamins tablet Take 1 tablet by mouth daily.    . Biotin 5000 MCG CAPS Take by mouth daily.    . Calcium Carbonate-Vitamin D 600-400 MG-UNIT per tablet Take 2 tablets by mouth daily.    . cyclobenzaprine (FLEXERIL) 5 MG tablet Take 5 mg by mouth as needed for muscle spasms.  [name] Sodium (COLACE PO) Take by mouth as needed.  . furosemide (LASIX) 40 MG tablet TAKE ONE TO TWO TABLETS BY MOUTH ONCE DAILY  . Glucosamine-Chondroit-Calcium 750-600-100 MG TABS Take 2 By mouth once daily   . levothyroxine (SYNTHROID, LEVOTHROID) 75 MCG tablet Take 1 tablet (75 mcg total) by mouth daily before breakfast.  . milk thistle 175 MG tablet Take 175 mg by mouth daily.    . Multiple  Vitamins-Minerals (ONE-A-DAY 50 PLUS PO) Take by mouth daily.    Marland Kitchen oxyCODONE-acetaminophen (PERCOCET) 10-325 MG per tablet Take 0.5-1 tablets by mouth every 6 (six) hours as needed for pain.  . Polyethylene Glycol 3350 (MIRALAX PO) Take by mouth as needed.  . Rivaroxaban (XARELTO) 15 MG TABS tablet Take 1 tablet (15 mg total) by mouth daily with supper.  . triamcinolone cream (KENALOG) 0.1 % Apply 1 application topically 2 (two) times daily.  . vitamin B-12 (CYANOCOBALAMIN) 1000 MCG tablet Take 1,000 mcg by mouth daily.     No current facility-administered medications on file prior to visit.    Review of Systems Per HPI unless specifically indicated above    Objective:    BP 122/76  Pulse 70  Temp(Src) 97.3 F (36.3 C) (Oral)  Wt 126 lb (57.153 kg)  SpO2 98%  Physical Exam  Nursing note and vitals reviewed. Constitutional: She appears well-developed and well-nourished. No distress.  Eyes: EOM are normal. Pupils are equal, round, and reactive to light. Right conjunctiva is not injected. Right conjunctiva has a hemorrhage. Left conjunctiva is not injected. Left conjunctiva has no hemorrhage. No scleral icterus.    Neck: Normal range of motion. Neck supple.  Skin: Ecchymosis (small resolving ecchymoses R lateral arm, L chin) noted.       Assessment & Plan:   Problem List Items Addressed  This Visit   Subconjunctival hemorrhage of right eye - Primary     Along with small resolving ecchymoses. Reassured should continue to resolve on it's own. Discussed celebrex + xarelto use.  Check plt count (300s on 08/2013).    Relevant Orders      CBC with Differential   Atrial fibrillation     Pt remains worried about increased bleeding on xarelto esp given h/o hematoma of leg while on coumadin. Already on lower dose of 15mg  daily, hesitant to decrease this further. Will decrease celebrex to 100mg  daily, discussed monitoring for arthritic pain control and decreased bruising. Update if  persistent ecchymoses.     Other Visit Diagnoses   Ecchymosis        Relevant Orders       CBC with Differential        Follow up plan: Return if symptoms worsen or fail to improve.

## 2013-12-12 NOTE — Assessment & Plan Note (Addendum)
Pt remains worried about increased bleeding on xarelto esp given h/o hematoma of leg while on coumadin. Already on lower dose of 15mg  daily, hesitant to decrease this further. Will decrease celebrex to 100mg  daily, discussed monitoring for arthritic pain control and decreased bruising. Update if persistent ecchymoses.

## 2013-12-12 NOTE — Patient Instructions (Addendum)
Let's check blood count today. We will call you with results. I recommend continuing xarelto 15mg  daily for now, and decreasing celebrex to 100mg  once daily. See if with this arthritis pain is well controlled, and bruising diminishes. If not let us know.

## 2013-12-12 NOTE — Assessment & Plan Note (Signed)
Along with small resolving ecchymoses. Reassured should continue to resolve on it's own. Discussed celebrex + xarelto use.  Check plt count (300s on 08/2013).

## 2013-12-12 NOTE — Telephone Encounter (Signed)
Seen today. 

## 2013-12-12 NOTE — Telephone Encounter (Signed)
Patient Information:  Caller Name: Cynethia  Phone: [phone]  Patient: [name]  Gender: Female  DOB: 04-12-1927  Age: 80 Years  PCP: Viviana Simpler [facility])  Office Follow Up:  Does the office need to follow up with this patient?: No  Instructions For The Office: N/A  RN Note:  On Xarelto; denies bleeding of gums or anywhere else.  Wears contacts but eye does not feel irritated. Vision unchanged. Requested PM appointment due to husband had medical appointment 1130 at [facility] office. Per Alison/office scheduler, OK to schedule out of order.  Symptoms  Reason For Call & Symptoms: Ruptured blood vessel in right eye.  On Xarelto.  Noted blood in medial aspect of eye while removing eye makeup this morning.  Asymptomatic; no vision changes or eye pain.  Reviewed Health History In EMR: Yes  Reviewed Medications In EMR: Yes  Reviewed Allergies In EMR: Yes  Reviewed Surgeries / Procedures: Yes  Date of Onset of Symptoms: 12/12/2013  Guideline(s) Used:  Eye - Red Without Pus  Disposition Per Guideline:   See Today in Office  Reason For Disposition Reached:   Bleeding on white of the eye and is taking Coumadin or known bleeding disorder (e.g., thrombocytopenia)  Advice Given:  Call Back If:  Blurred vision or increasing pain  No improvement  You become worse.  Patient Will Follow Care Advice:  YES  Appointment Scheduled:  12/12/2013 12:15:00 Appointment Scheduled Provider:  Other

## 2013-12-12 NOTE — Progress Notes (Signed)
Pre visit review using our clinic review tool, if applicable. No additional management support is needed unless otherwise documented below in the visit note. 

## 2013-12-13 ENCOUNTER — Telehealth: Payer: Self-pay | Admitting: Internal Medicine

## 2013-12-13 ENCOUNTER — Other Ambulatory Visit: Payer: Self-pay | Admitting: Family Medicine

## 2013-12-13 ENCOUNTER — Encounter: Payer: Self-pay | Admitting: Family Medicine

## 2013-12-13 DIAGNOSIS — D649 Anemia, unspecified: Secondary | ICD-10-CM

## 2013-12-13 NOTE — Telephone Encounter (Signed)
Spoke with patient and question answered. She wanted to come in today instead of waiting until next week. Advised Dr. Darnell Level wanted her to wait until next week to see if there was anymore change in her Hgb. She verbalized understanding.

## 2013-12-13 NOTE — Telephone Encounter (Signed)
Pt would like you to call her  She has ? About lab work

## 2013-12-19 ENCOUNTER — Other Ambulatory Visit (INDEPENDENT_AMBULATORY_CARE_PROVIDER_SITE_OTHER): Payer: Medicare Other

## 2013-12-19 DIAGNOSIS — D649 Anemia, unspecified: Secondary | ICD-10-CM

## 2013-12-19 LAB — HEMOGLOBIN AND HEMATOCRIT, BLOOD
HCT: 29.3 % — ABNORMAL LOW (ref 36.0–46.0)
Hemoglobin: 9.7 g/dL — ABNORMAL LOW (ref 12.0–15.0)

## 2013-12-19 LAB — FERRITIN: FERRITIN: 18.3 ng/mL (ref 10.0–291.0)

## 2013-12-19 LAB — IBC PANEL
IRON: 39 ug/dL — AB (ref 42–145)
Saturation Ratios: 7.5 % — ABNORMAL LOW (ref 20.0–50.0)
Transferrin: 369.7 mg/dL — ABNORMAL HIGH (ref 212.0–360.0)

## 2013-12-21 ENCOUNTER — Other Ambulatory Visit: Payer: Medicare Other

## 2013-12-21 DIAGNOSIS — D649 Anemia, unspecified: Secondary | ICD-10-CM

## 2013-12-21 LAB — FECAL OCCULT BLOOD, IMMUNOCHEMICAL: Fecal Occult Bld: NEGATIVE

## 2013-12-22 ENCOUNTER — Other Ambulatory Visit: Payer: Self-pay | Admitting: Family Medicine

## 2013-12-22 DIAGNOSIS — D509 Iron deficiency anemia, unspecified: Secondary | ICD-10-CM

## 2013-12-22 DIAGNOSIS — I4891 Unspecified atrial fibrillation: Secondary | ICD-10-CM

## 2013-12-24 ENCOUNTER — Telehealth: Payer: Self-pay

## 2013-12-24 NOTE — Telephone Encounter (Signed)
Let her know I think she should stay on the xarelto unless she has clear cut bleeding---the risks of stroke outweigh the risks of possible bleeding (unless active). She can discuss this with Dr Rockey Situ (hopefully that appt is this week)

## 2013-12-24 NOTE — Telephone Encounter (Signed)
Pt left v/m; pt got report on my chart and pt wants to know Dr Alla German opinion if she should stop Xarelto; pt has appt with Dr Rockey Situ about possibly starting Eliquis. Pt is still taking Xarelto. Pt request cb.

## 2013-12-24 NOTE — Telephone Encounter (Signed)
[name] spoke with patient and advised results

## 2013-12-27 ENCOUNTER — Other Ambulatory Visit: Payer: Self-pay | Admitting: Internal Medicine

## 2013-12-31 ENCOUNTER — Encounter: Payer: Self-pay | Admitting: Surgery

## 2014-01-02 ENCOUNTER — Encounter: Payer: Self-pay | Admitting: Surgery

## 2014-01-07 ENCOUNTER — Telehealth: Payer: Self-pay | Admitting: *Deleted

## 2014-01-07 NOTE — Telephone Encounter (Signed)
Make sure she has stopped the celebrex   She can try OTC iron tabs---they may be more tolerable than Rx due to lower dose

## 2014-01-07 NOTE — Telephone Encounter (Signed)
Pt states she just left Dr. Liliane Channel office and he told her she's bleeding in the stomach from the celebrex and xarelto. Per pt Dr. Earlean Shawl is sending a copy of the labs over, also pt states she has appt with cardiology on Friday and is asking if she needs to be seen her first? Per pt her iron is low and would feel better if she could take something to bring it up.

## 2014-01-08 NOTE — Telephone Encounter (Signed)
Spoke with patient and advised results, she will try the OTC iron

## 2014-01-10 ENCOUNTER — Encounter: Payer: Self-pay | Admitting: Internal Medicine

## 2014-01-11 ENCOUNTER — Encounter: Payer: Self-pay | Admitting: Cardiovascular Disease

## 2014-01-11 ENCOUNTER — Ambulatory Visit (INDEPENDENT_AMBULATORY_CARE_PROVIDER_SITE_OTHER): Payer: Medicare Other | Admitting: Cardiovascular Disease

## 2014-01-11 VITALS — BP 120/66 | HR 102 | Ht 66.0 in | Wt 125.0 lb

## 2014-01-11 DIAGNOSIS — H1131 Conjunctival hemorrhage, right eye: Secondary | ICD-10-CM

## 2014-01-11 DIAGNOSIS — K2990 Gastroduodenitis, unspecified, without bleeding: Secondary | ICD-10-CM

## 2014-01-11 DIAGNOSIS — H1130 Conjunctival hemorrhage, unspecified eye: Secondary | ICD-10-CM

## 2014-01-11 DIAGNOSIS — I872 Venous insufficiency (chronic) (peripheral): Secondary | ICD-10-CM

## 2014-01-11 DIAGNOSIS — I4891 Unspecified atrial fibrillation: Secondary | ICD-10-CM

## 2014-01-11 DIAGNOSIS — E785 Hyperlipidemia, unspecified: Secondary | ICD-10-CM

## 2014-01-11 DIAGNOSIS — K2970 Gastritis, unspecified, without bleeding: Secondary | ICD-10-CM

## 2014-01-11 DIAGNOSIS — D500 Iron deficiency anemia secondary to blood loss (chronic): Secondary | ICD-10-CM

## 2014-01-11 DIAGNOSIS — Z6379 Other stressful life events affecting family and household: Secondary | ICD-10-CM

## 2014-01-11 DIAGNOSIS — M1990 Unspecified osteoarthritis, unspecified site: Secondary | ICD-10-CM

## 2014-01-11 DIAGNOSIS — M159 Polyosteoarthritis, unspecified: Secondary | ICD-10-CM

## 2014-01-11 DIAGNOSIS — M129 Arthropathy, unspecified: Secondary | ICD-10-CM

## 2014-01-11 DIAGNOSIS — M158 Other polyosteoarthritis: Secondary | ICD-10-CM

## 2014-01-11 MED ORDER — RIVAROXABAN 15 MG PO TABS
15.0000 mg | ORAL_TABLET | Freq: Every day | ORAL | Status: DC
Start: 2014-01-11 — End: 2014-03-07

## 2014-01-11 NOTE — Assessment & Plan Note (Signed)
Swelling of the left lower extremity, no significant right lower extremity swelling. Currently wearing a compression hose on the left. Encouraged leg elevation. Currently with a wound

## 2014-01-11 NOTE — Progress Notes (Signed)
Patient ID: [name], female    DOB: 1926/07/31, 80 y.o.   MRN: [id number]  HPI Comments: [name] is a 80 year old woman with history of chronic atrial fibrillation, on anticoagulation, severe arthritis, hypothyroidism, diastolic CHF who presents to establish care in the [place] office.  Recent upper GI by Dr. Earlean Shawl  01/07/2014 showing nonerosive gastritis moderate, nonerosive duodenitis mild likely from NSAID use exacerbated by anticoagulation. She was started on Prilosec 40 mg daily, told to discontinue her NSAIDs She is taking Celebrex initially 200 mg for many years, down to 100 mg recently. She is now held this medication  She reports having severe pain all over. Discomfort in her hands, back, in her legs. She has scoliosis. Occasionally takes Percocet now that she is not on Celebrex. She feels miserable.  Also reports having occasional cough with phlegm. Slightly better than before. Long history of atrial fibrillation with remote history of cardioversion x3 which was not successful in maintaining normal sinus rhythm. Since then with chronic atrial fibrillation. Typically heart rate is relatively well-controlled. She denies any significant shortness of breath with exertion. No chest pain. She was previously on warfarin, then tried pradaxa, now taking xarelto 15 mg daily She is interested in possibly changing to Eliquis 2.5 mg twice a day  Recent injury to her left lower extremity, she hit the bathtub. Wounds now in her upper shin, wrap in place. Leg is mildly swollen, no swelling of the right leg  EKG shows atrial fibrillation with ventricular rate 102 beats per minute, poor R-wave progression to the anterior precordial leads, unable to exclude old anterior infarct, left axis deviation     Outpatient Encounter Prescriptions as of 01/11/2014  Medication Sig  . atenolol (TENORMIN) 25 MG tablet TAKE ONE TABLET BY MOUTH ONCE DAILY  . b complex vitamins tablet Take 1 tablet  by mouth daily.    . Biotin 5000 MCG CAPS Take by mouth daily.    Marland Kitchen CALCIUM CARBONATE PO Take 1,200 mg by mouth daily.  . CHONDROITIN SULFATE PO Take 1,200 mg by mouth daily.  . cyanocobalamin 2000 MCG tablet Take 2,000 mcg by mouth daily.  . cyclobenzaprine (FLEXERIL) 5 MG tablet Take 5 mg by mouth as needed for muscle spasms.  [name] Sodium (COLACE PO) Take by mouth as needed.  . ferrous sulfate 325 (65 FE) MG tablet Take 325 mg by mouth daily with breakfast.  . furosemide (LASIX) 40 MG tablet TAKE ONE TABLETS BY MOUTH ONCE DAILY  . Glucosamine HCl (GLUCOSAMINE PO) Take 1,500 mg by mouth daily.  Marland Kitchen levothyroxine (SYNTHROID, LEVOTHROID) 75 MCG tablet TAKE ONE TABLET BY MOUTH ONCE DAILY BEFORE BREAKFAST  . Milk Thistle 1000 MG CAPS Take by mouth daily.  . Multiple Vitamins-Minerals (ONE-A-DAY 50 PLUS PO) Take by mouth daily.    . NON FORMULARY Astringent solution as needed.  Marland Kitchen oxyCODONE-acetaminophen (PERCOCET) 10-325 MG per tablet Take 0.5-1 tablets by mouth every 6 (six) hours as needed for pain.  . Polyethylene Glycol 3350 (MIRALAX PO) Take by mouth as needed.  . Rivaroxaban (XARELTO) 15 MG TABS tablet Take 1 tablet (15 mg total) by mouth daily with supper.  . triamcinolone cream (KENALOG) 0.1 % Apply 1 application topically 2 (two) times daily.  . vitamin C (ASCORBIC ACID) 500 MG tablet Take 500 mg by mouth daily.  Marland Kitchen zinc sulfate 220 MG capsule Take 220 mg by mouth daily.    Review of Systems  Constitutional: Positive for fatigue.  HENT: Negative.  Eyes: Negative.   Respiratory: Negative.   Cardiovascular: Positive for leg swelling.  Gastrointestinal: Negative.   Endocrine: Negative.   Musculoskeletal: Negative.   Skin: Negative.   Allergic/Immunologic: Negative.   Neurological: Negative.   Hematological: Negative.   Psychiatric/Behavioral: The patient is nervous/anxious.   All other systems reviewed and are negative.   BP 120/66  Pulse 102  Ht 5\' 6"  (1.676 m)  Wt  125 lb (56.7 kg)  BMI 20.19 kg/m2  Physical Exam  Nursing note and vitals reviewed. Constitutional: She is oriented to person, place, and time. She appears well-developed and well-nourished.  HENT:  Head: Normocephalic.  Nose: Nose normal.  Mouth/Throat: Oropharynx is clear and moist.  Eyes: Conjunctivae are normal. Pupils are equal, round, and reactive to light.  Neck: Normal range of motion. Neck supple. No JVD present.  Cardiovascular: Normal rate, regular rhythm, S1 normal, S2 normal, normal heart sounds and intact distal pulses.  Exam reveals no gallop and no friction rub.   No murmur heard. Pulmonary/Chest: Effort normal and breath sounds normal. No respiratory distress. She has no wheezes. She has no rales. She exhibits no tenderness.  Abdominal: Soft. Bowel sounds are normal. She exhibits no distension. There is no tenderness.  Musculoskeletal: Normal range of motion. She exhibits no edema and no tenderness.  Lymphadenopathy:    She has no cervical adenopathy.  Neurological: She is alert and oriented to person, place, and time. Coordination normal.  Skin: Skin is warm and dry. No rash noted. No erythema.  Psychiatric: She has a normal mood and affect. Her behavior is normal. Judgment and thought content normal.    Assessment and Plan

## 2014-01-11 NOTE — Assessment & Plan Note (Addendum)
Recent upper GI bleed from gastritis. Now no longer on NSAIDs. Currently on high-dose PPI. Most recent hematocrit 29. She's taking iron.

## 2014-01-11 NOTE — Assessment & Plan Note (Signed)
Currently on PPI after recent EGD documenting gastritis. Is likely the cause of her anemia and blood loss.

## 2014-01-11 NOTE — Assessment & Plan Note (Signed)
Currently not on a statin. He did not have a chance to discuss this with her. She has numerous issues going on currently. Would likely not be a good time to start any medication.

## 2014-01-11 NOTE — Patient Instructions (Signed)
You are doing well. No medication changes were made.  Try tramadol for pain, 1 to 3 times a day as needed  Please call us if you have new issues that need to be addressed before your next appt.  Your physician wants you to follow-up in: 6 months.  You will receive a reminder letter in the mail two months in advance. If you don't receive a letter, please call our office to schedule the follow-up appointment.

## 2014-01-11 NOTE — Assessment & Plan Note (Signed)
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 She's very tired. She does have help at home for him.

## 2014-01-11 NOTE — Assessment & Plan Note (Signed)
Chronic atrial fibrillation. Heart rate mildly elevated though did improve during her visit. We'll continue to monitor the heart rate in the this runs high on a regular basis, we could increase her atenolol dose. We had a long discussion about anticoagulation. I suspect that she will do well on Prilosec in terms of her gastritis. She will check the Price of eliquis, including possible mail order options. Currently she pays $90 for xarelto per month. If Eliquis is financially possible, we will change from xarelto to eliquis  2.5 mg twice a day at her request. There is a slight bleeding advantage, similar good coverage for stroke.

## 2014-01-11 NOTE — Assessment & Plan Note (Signed)
She reports a long history of arthritis. Previous meloxicam, and Celebrex. Given recent GI bleed, now no longer on NSAIDs. She's taking Percocets occasionally. She is requesting other alternatives. We have suggested she could try tramadol.

## 2014-01-11 NOTE — Assessment & Plan Note (Signed)
Hemorrhage has resolved. She does not seem very concerned about this

## 2014-01-15 ENCOUNTER — Encounter: Payer: Self-pay | Admitting: Cardiovascular Disease

## 2014-01-16 ENCOUNTER — Telehealth: Payer: Self-pay | Admitting: *Deleted

## 2014-01-16 NOTE — Telephone Encounter (Signed)
Patient called for samples of Xarelto. Please call when ready.

## 2014-01-16 NOTE — Telephone Encounter (Signed)
Notified patient samples of xarelto 15 mg are available for pick up.

## 2014-01-21 ENCOUNTER — Encounter: Payer: Self-pay | Admitting: Internal Medicine

## 2014-01-23 ENCOUNTER — Ambulatory Visit (INDEPENDENT_AMBULATORY_CARE_PROVIDER_SITE_OTHER): Payer: Medicare Other | Admitting: Internal Medicine

## 2014-01-23 ENCOUNTER — Encounter: Payer: Self-pay | Admitting: Internal Medicine

## 2014-01-23 ENCOUNTER — Ambulatory Visit (INDEPENDENT_AMBULATORY_CARE_PROVIDER_SITE_OTHER)
Admission: RE | Admit: 2014-01-23 | Discharge: 2014-01-23 | Disposition: A | Discharge status: Home / Self Care | Payer: Medicare Other | Source: Ambulatory Visit | Attending: Internal Medicine | Admitting: Internal Medicine

## 2014-01-23 VITALS — BP 120/80 | HR 70 | Temp 97.5°F | Wt 123.0 lb

## 2014-01-23 DIAGNOSIS — M79609 Pain in unspecified limb: Secondary | ICD-10-CM

## 2014-01-23 DIAGNOSIS — M79641 Pain in right hand: Secondary | ICD-10-CM | POA: Insufficient documentation

## 2014-01-23 MED ORDER — OXYCODONE-ACETAMINOPHEN 10-325 MG PO TABS: 0.5000 | ORAL_TABLET | ORAL | Status: DC | PRN

## 2014-01-23 NOTE — Progress Notes (Signed)
Pre visit review using our clinic review tool, if applicable. No additional management support is needed unless otherwise documented below in the visit note. 

## 2014-01-23 NOTE — Progress Notes (Signed)
Subjective:    Patient ID: [name], female    DOB: 1926/12/18, 80 y.o.   MRN: [id number]  HPI Awoke with terrible in right wrist this morning No known trauma or bug bites Tried cold compresses---still terrible pain Hasn't used any pain meds  Current Outpatient Prescriptions on File Prior to Visit  Medication Sig Dispense Refill  . atenolol (TENORMIN) 25 MG tablet TAKE ONE TABLET BY MOUTH ONCE DAILY  90 tablet  0  . b complex vitamins tablet Take 1 tablet by mouth daily.        . Biotin 5000 MCG CAPS Take by mouth daily.        Marland Kitchen CALCIUM CARBONATE PO Take 1,200 mg by mouth daily.      . CHONDROITIN SULFATE PO Take 1,200 mg by mouth daily.      . cyanocobalamin 2000 MCG tablet Take 2,000 mcg by mouth daily.      . cyclobenzaprine (FLEXERIL) 5 MG tablet Take 5 mg by mouth as needed for muscle spasms.      [name] Sodium (COLACE PO) Take by mouth as needed.      . ferrous sulfate 325 (65 FE) MG tablet Take 325 mg by mouth daily with breakfast.      . furosemide (LASIX) 40 MG tablet TAKE ONE TABLETS BY MOUTH ONCE DAILY      . Glucosamine HCl (GLUCOSAMINE PO) Take 1,500 mg by mouth daily.      Marland Kitchen levothyroxine (SYNTHROID, LEVOTHROID) 75 MCG tablet TAKE ONE TABLET BY MOUTH ONCE DAILY BEFORE BREAKFAST  90 tablet  0  . Milk Thistle 1000 MG CAPS Take by mouth daily.      . Multiple Vitamins-Minerals (ONE-A-DAY 50 PLUS PO) Take by mouth daily.        . NON FORMULARY Astringent solution as needed.      Marland Kitchen oxyCODONE-acetaminophen (PERCOCET) 10-325 MG per tablet Take 0.5-1 tablets by mouth every 6 (six) hours as needed for pain.  30 tablet  0  . Polyethylene Glycol 3350 (MIRALAX PO) Take by mouth as needed.      . Rivaroxaban (XARELTO) 15 MG TABS tablet Take 1 tablet (15 mg total) by mouth daily with supper.  30 tablet  11  . triamcinolone cream (KENALOG) 0.1 % Apply 1 application topically 2 (two) times daily.  454 g  1  . vitamin C (ASCORBIC ACID) 500 MG tablet Take 500 mg by mouth  daily.      Marland Kitchen zinc sulfate 220 MG capsule Take 220 mg by mouth daily.       No current facility-administered medications on file prior to visit.    Allergies  Allergen Reactions  . Dabigatran Etexilate Mesylate Shortness Of Breath  . Coumadin [Warfarin Sodium] Other (See Comments)    hematoma  . Penicillins   . Pradaxa [Dabigatran Etexilate Mesylate] Other (See Comments)    SLEEP  . Sulfonamide Derivatives   . Tape Other (See Comments)    blisters    Past Medical History  Diagnosis Date  . Arthritis   . Hyperlipidemia   . Thyroid disease   . Atrial fibrillation   . Diverticulosis     by colonoscopy, h/o colon adenoma [facility])    Past Surgical History  Procedure Laterality Date  . Rotator cuff repair    . Intraocular lens implant, secondary  1996/ 2010  . Knee surgery      lateral  . Tonsillectomy    . Colonoscopy  02/2011  mod diverticulosis ([facility])  . Total knee arthroplasty Bilateral     Family History  Problem Relation Age of Onset  . Heart disease Mother     History   Social History  . Marital Status: Married    Spouse Name: N/A    Number of Children: N/A  . Years of Education: N/A   Occupational History  . retired    Social History Main Topics  . Smoking status: Former Smoker    Types: Cigarettes  . Smokeless tobacco: Never Used  . Alcohol Use: Yes  . Drug Use: No  . Sexual Activity: Not on file   Other Topics Concern  . Not on file   Social History Narrative   Regular exercise   Remains active with church and community activities      Has living will   Husband, then Barton Fanny (friend), is health care POA   Has DNR order---form done again 05/15/12   No tube feeds if cognitively unaware   Review of Systems No fever No clear nausea-appetite off due to pain    Objective:   Physical Exam  Musculoskeletal:  Swelling and mild redness on dorsum of right hand at base of 2nd metacarpal and some medially            Assessment & Plan:

## 2014-01-23 NOTE — Assessment & Plan Note (Addendum)
Sudden pain with swelling and mild redness/warmth overnight (without trauma) X-ray shows advanced arthritic changes but no fracture Appearance doesn't look like infection May be worse since she had to go celebrex (but needs to stay off given the GI bleed)  Discussed ice and percocet for now Would not immobilize

## 2014-01-31 ENCOUNTER — Encounter: Payer: Self-pay | Admitting: Internal Medicine

## 2014-02-01 NOTE — Telephone Encounter (Signed)
Please see Mychart message.

## 2014-02-02 ENCOUNTER — Encounter: Payer: Self-pay | Admitting: Surgery

## 2014-02-04 ENCOUNTER — Telehealth: Payer: Self-pay

## 2014-02-04 NOTE — Telephone Encounter (Signed)
Spoke w/ pt.  Please see pt's MyChart message w/ Dr. Silvio Pate.  Pt reports coughing up some tinges of blood in phlegm since last Tues, but it has resolved today.  She

## 2014-02-04 NOTE — Telephone Encounter (Signed)
Pt reports "red, bloody blotches" under her skin on her face, about 1/2" x 1/4" near her eye and on her cheek that appeared suddenly, but seem to be resolving.  She would like to know if these are typical side effects of Xarelto and if Dr. Rockey Situ would like for her to continue on this.  Please advise.  Thank you.

## 2014-02-04 NOTE — Telephone Encounter (Signed)
Spoke w/ pt.  Advised her of Dr. Donivan Scull recommendation.  She verbalizes understanding and is agreeable.  She will continue to monitor sx and call if they worsen or continue.

## 2014-02-04 NOTE — Telephone Encounter (Signed)
Left message w/ pt's husband, as well as on her voicemail, to call back.

## 2014-02-04 NOTE — Telephone Encounter (Signed)
Would try to stay on a blood thinner for now These can be side effects of being on anticoagulation Any coughing or straining could cause bruising, blotches on the face from straining. Heavy coughing can lead to very small amounts of blood coming from nasal passages or lungs. Usually this will resolve.   If coughing or straining persists, need to work on that. May need cough drops, other cough suppressants.

## 2014-02-04 NOTE — Telephone Encounter (Signed)
Pt states she has been coughing up red blood, it started last week, called her PCP, states it seems to be getting better, states it lasted for about 6 days,  but wants to talk with Dr. Rockey Situ about this. please call.

## 2014-02-19 ENCOUNTER — Encounter: Payer: Self-pay | Admitting: General Surgery

## 2014-03-07 ENCOUNTER — Ambulatory Visit (INDEPENDENT_AMBULATORY_CARE_PROVIDER_SITE_OTHER): Payer: Medicare Other | Admitting: Nurse Practitioner

## 2014-03-07 ENCOUNTER — Encounter: Payer: Self-pay | Admitting: Nurse Practitioner

## 2014-03-07 VITALS — BP 130/70 | HR 90 | Ht 66.0 in | Wt 124.0 lb

## 2014-03-07 DIAGNOSIS — I4891 Unspecified atrial fibrillation: Secondary | ICD-10-CM

## 2014-03-07 DIAGNOSIS — T148XXA Other injury of unspecified body region, initial encounter: Secondary | ICD-10-CM

## 2014-03-07 MED ORDER — APIXABAN 2.5 MG PO TABS: 2.5000 mg | ORAL_TABLET | Freq: Two times a day (BID) | ORAL | Status: DC

## 2014-03-07 NOTE — Progress Notes (Signed)
Patient Name: [name] Date of Encounter: 03/07/2014  Primary Care Provider:  Viviana Simpler, MD Primary Cardiologist:  Johnny Bridge, MD   Patient Profile  80 y/o female with a h/o afib currently on xarelto, who has been having bruising and would like to try an alternate agent.  Problem List   Past Medical History  Diagnosis Date  . Arthritis   . Hyperlipidemia   . Hypothyroidism   . Atrial fibrillation     a. s/p prior DCCV's, now persistent-->Xarelto (CHA2DS2VASc = 3).  . Diverticulosis     a. by colonoscopy, h/o colon adenoma ([facility])  . Gastritis and duodenitis     a. 01/2014 EGD: Moderate non-erosive gastritis and mild non-erosive duodenitis.   Past Surgical History  Procedure Laterality Date  . Rotator cuff repair    . Intraocular lens implant, secondary  1996/ 2010  . Knee surgery      lateral  . Tonsillectomy    . Colonoscopy  02/2011    mod diverticulosis ([facility])  . Total knee arthroplasty Bilateral     Allergies  Allergies  Allergen Reactions  . Dabigatran Etexilate Mesylate Shortness Of Breath  . Coumadin [Warfarin Sodium] Other (See Comments)    hematoma  . Penicillins   . Pradaxa [Dabigatran Etexilate Mesylate] Other (See Comments)    SLEEP  . Sulfonamide Derivatives   . Tape Other (See Comments)    blisters  . Tramadol Hcl     Itching     HPI  80 y/o female with the above problem list. She has a h/o persistent afib, severe OA, and also non-erosive gastritis and duodenitis (EGD 01/2014).  She was previously advised to discontinue NSAID usage and has been using oxycodone and prn tylenol instead.  She has been having easy bruisability on xarelto and more recently has noted more significant epidermal/subcutaneous bleeding affecting her arms and left cheek.  As a result, she'd like to try eliquis instead.  She denies chest pain, palpitations, dyspnea, pnd, orthopnea, n, v, dizziness, syncope, edema, weight gain, or early satiety.   Home  Medications  Prior to Admission medications   Medication Sig Start Date End Date Taking? Authorizing Provider  acetaminophen (TYLENOL) 650 MG CR tablet Take 650 mg by mouth every 8 (eight) hours as needed for pain.   Yes Historical Provider, MD  atenolol (TENORMIN) 25 MG tablet TAKE ONE TABLET BY MOUTH ONCE DAILY 12/27/13  Yes Venia Carbon, MD  b complex vitamins tablet Take 1 tablet by mouth daily.     Yes Historical Provider, MD  Biotin 5000 MCG CAPS Take by mouth daily.     Yes Venia Carbon, MD  CALCIUM CARBONATE PO Take 1,200 mg by mouth daily.   Yes Historical Provider, MD  CHONDROITIN SULFATE PO Take 1,200 mg by mouth daily.   Yes Historical Provider, MD  cyanocobalamin 2000 MCG tablet Take 2,000 mcg by mouth daily.   Yes Historical Provider, MD  cyclobenzaprine (FLEXERIL) 5 MG tablet Take 5 mg by mouth as needed for muscle spasms.   Yes Historical Provider, MD  Docusate Sodium (COLACE PO) Take by mouth as needed.   Yes Historical Provider, MD  furosemide (LASIX) 40 MG tablet TAKE ONE TABLETS BY MOUTH ONCE DAILY 10/08/13  Yes Venia Carbon, MD  Glucosamine HCl (GLUCOSAMINE PO) Take 1,500 mg by mouth daily.   Yes Historical Provider, MD  Iron-Vitamin C (VITRON-C) 65-125 MG TABS Take 65 mg by mouth daily.   Yes Historical Provider,  MD  levothyroxine (SYNTHROID, LEVOTHROID) 75 MCG tablet TAKE ONE TABLET BY MOUTH ONCE DAILY BEFORE BREAKFAST 12/27/13  Yes Venia Carbon, MD  Milk Thistle 1000 MG CAPS Take by mouth daily.   Yes Historical Provider, MD  Multiple Vitamins-Minerals (ONE-A-DAY 50 PLUS PO) Take by mouth daily.     Yes Historical Provider, MD  NON FORMULARY Astringent solution as needed.   Yes Historical Provider, MD  omeprazole (PRILOSEC) 20 MG capsule Take 20 mg by mouth 2 (two) times daily before a meal.   Yes Historical Provider, MD  oxyCODONE-acetaminophen (PERCOCET) 10-325 MG per tablet Take 0.5-1 tablets by mouth every 4 (four) hours as needed for pain. 01/23/14  Yes  Venia Carbon, MD  Polyethylene Glycol 3350 (MIRALAX PO) Take by mouth as needed.   Yes Historical Provider, MD  triamcinolone cream (KENALOG) 0.1 % Apply 1 application topically 2 (two) times daily. 05/30/13  Yes Owens Loffler, MD  vitamin C (ASCORBIC ACID) 500 MG tablet Take 500 mg by mouth daily.   Yes Historical Provider, MD  zinc sulfate 220 MG capsule Take 220 mg by mouth daily.   Yes Historical Provider, MD  apixaban (ELIQUIS) 2.5 MG TABS tablet Take 1 tablet (2.5 mg total) by mouth 2 (two) times daily. 03/07/14   Rogelia Mire, NP  ferrous sulfate 325 (65 FE) MG tablet Take 325 mg by mouth daily with breakfast.    Historical Provider, MD    Review of Systems  Bruising and subQ bleeding as above.  All other systems reviewed and are otherwise negative except as noted above.  Physical Exam  Blood pressure 130/70, pulse 90, height 5\' 6"  (1.676 m), weight 124 lb (56.246 kg).  General: Pleasant, NAD.  Quarter sized area of bruising on her left cheek. Psych: Normal affect. Neuro: Alert and oriented X 3. Moves all extremities spontaneously. HEENT: Normal  Neck: Supple without bruits or JVD. Lungs:  Resp regular and unlabored, CTA. Heart: RRR no s3, s4, 2/6 SEM LLSB. Abdomen: Soft, non-tender, non-distended, BS + x 4.  Extremities: No clubbing, cyanosis or edema. DP/PT/Radials 2+ and equal bilaterally.  Accessory Clinical Findings  ECG - afib, 91, IVCD, no acute st/t changes.  Assessment & Plan  1.  Bruising:  Pt has been having bruising and mild subcutaneous bleeding of her arms and more recently of her left cheek after lying on her left side/face during a massage recently.  She would like to try an alternate to xarelto.  At age 60 with a creat clearance that calculates out to 36.5, I have given her samples of eliquis 2.5mg  bid.  She may take her first dose this evening, when she would normally take her regularly scheduled dose of xarelto.  We have also provided her paperwork  for the eliquis assistance program and a 30 day free card.  I advised that though eliquis performed better vs coumadin in major bleeding, that I would expect that her degree of bruising may be similar to that which has been occurring while on xarelto.  Further, changing to eliquis, will not allow her to get back on NSAIDS, which she was hopeful to do.  Will check a cbc and bmet today since we're changing her noac therapy.  2.  Afib:  Rate controlled and asymptomatic.  Cont bb.  Changing to eliquis as above.  3.  Gastritis/duodenitis:  On PPI.  No NSAIDs.  4.  OA:  Severe.  She did not get any relief on tramadol and has been  taking oxycodone and prn tylenol with some relief.  She was hopeful that I may be able to recommend an alternate anti-inflammatory for her and I advised that she should touch base with her PCP.  5.  Dispo:  F/U with Dr. Rockey Situ as previously scheduled.   Murray Hodgkins, NP 03/07/2014, 4:15 PM

## 2014-03-07 NOTE — Patient Instructions (Addendum)
We will draw your labs today:  CBC & BMET  Please stop Xarelto  Please start Eliquis 2.5mg  twice daily  Your physician recommends that you schedule a follow-up appointment in: January w/ Dr. Rockey Situ

## 2014-03-08 LAB — CBC WITH DIFFERENTIAL
Basophils Absolute: 0 10*3/uL (ref 0.0–0.2)
Basos: 1 %
Eos: 0 %
Eosinophils Absolute: 0 10*3/uL (ref 0.0–0.4)
HCT: 33.1 % — ABNORMAL LOW (ref 34.0–46.6)
Hemoglobin: 10.6 g/dL — ABNORMAL LOW (ref 11.1–15.9)
IMMATURE GRANULOCYTES: 0 %
Immature Grans (Abs): 0 10*3/uL (ref 0.0–0.1)
LYMPHS ABS: 1.4 10*3/uL (ref 0.7–3.1)
Lymphs: 24 %
MCH: 28.1 pg (ref 26.6–33.0)
MCHC: 32 g/dL (ref 31.5–35.7)
MCV: 88 fL (ref 79–97)
MONOS ABS: 0.7 10*3/uL (ref 0.1–0.9)
Monocytes: 12 %
Neutrophils Absolute: 3.7 10*3/uL (ref 1.4–7.0)
Neutrophils Relative %: 63 %
PLATELETS: 349 10*3/uL (ref 150–379)
RBC: 3.77 x10E6/uL (ref 3.77–5.28)
RDW: 16.7 % — ABNORMAL HIGH (ref 12.3–15.4)
WBC: 5.8 10*3/uL (ref 3.4–10.8)

## 2014-03-08 LAB — BASIC METABOLIC PANEL
BUN / CREAT RATIO: 25 (ref 11–26)
BUN: 27 mg/dL (ref 8–27)
CO2: 24 mmol/L (ref 18–29)
Calcium: 9.8 mg/dL (ref 8.7–10.3)
Chloride: 96 mmol/L — ABNORMAL LOW (ref 97–108)
Creatinine, Ser: 1.09 mg/dL — ABNORMAL HIGH (ref 0.57–1.00)
GFR calc Af Amer: 53 mL/min/{1.73_m2} — ABNORMAL LOW (ref >59)
GFR, EST NON AFRICAN AMERICAN: 46 mL/min/{1.73_m2} — AB (ref >59)
Glucose: 95 mg/dL (ref 65–99)
Potassium: 5.3 mmol/L — ABNORMAL HIGH (ref 3.5–5.2)
SODIUM: 140 mmol/L (ref 134–144)

## 2014-03-12 ENCOUNTER — Telehealth: Payer: Self-pay

## 2014-03-12 MED ORDER — APIXABAN 2.5 MG PO TABS: 2.5000 mg | ORAL_TABLET | Freq: Two times a day (BID) | ORAL | Status: DC

## 2014-03-12 NOTE — Telephone Encounter (Signed)
Spoke w/ pt's husband.  Advised him that we had received a fax from [employer] patient assistance requesting proof of pt's income to be faxed to them.  He will pt call if she has any questions or concerns.

## 2014-03-25 ENCOUNTER — Encounter: Payer: Self-pay | Admitting: Internal Medicine

## 2014-03-25 ENCOUNTER — Telehealth: Payer: Self-pay

## 2014-03-25 DIAGNOSIS — M1990 Unspecified osteoarthritis, unspecified site: Secondary | ICD-10-CM

## 2014-03-25 NOTE — Telephone Encounter (Signed)
Please see note below. 

## 2014-03-25 NOTE — Telephone Encounter (Signed)
Spoke w/ pt.  Advised her that I called [employer] and was advised that pt does not qualify for assistance for Eliquis.  They state that pt has coverage thru [facility], 30% co-ins and mail order program, that pt has an OOP deductible of $1750, of which she has reached $814.14.  Advised pt that after she reaches her deductible, her meds will be covered 100%. Pt is not happy about this, as her current co-pay is $100.   She asks if we can supply her w/ samples.  Advised pt that I can give her samples if we have them, but for her not to rely on this.  Am leaving samples of Eliquis at front desk for her to pick up at her convenience.  Gave her # to [employer], as she wanted to call and speak w/ them.

## 2014-03-25 NOTE — Telephone Encounter (Signed)
Pt states she has not heard anything from her paperwork she sent into regarding getting help with Eliquis. Please call.

## 2014-04-01 ENCOUNTER — Other Ambulatory Visit: Payer: Self-pay | Admitting: Internal Medicine

## 2014-04-01 NOTE — Telephone Encounter (Signed)
[facility] [address] request refill on levothyroxine and atenolol;done.

## 2014-04-02 ENCOUNTER — Telehealth: Payer: Self-pay | Admitting: *Deleted

## 2014-04-02 NOTE — Telephone Encounter (Signed)
Spoke w/ pt.  She reports that she vomited this am and has "blood really bad under the skin on my face".  She reports that it is on both sides of her face and she is covering it up w/ makeup.  Attempted to discuss w/ pt, but she interrupts, stating "this is bad.  Should I just go back to coumadin?" Advised pt that she most likely burst a blood vessel and there is no need to change her meds based on this, but she insists that I notify Dr. Rockey Situ and inquire as to his opinion.  She states "I saw that little PA fellow, but I want to know what Dr. Rockey Situ wants me to do". Please advise.  Thank you.

## 2014-04-02 NOTE — Telephone Encounter (Signed)
Pt left message on voicemail stating "I don't think Dr. Rockey Situ realizes how bad this is.  I can come by tomorrow without any makeup on and let him look at it".  She would like a call back by tomorrow am.

## 2014-04-02 NOTE — Telephone Encounter (Signed)
Patient called and she vomited and now has a bleed spot that has come up on her face. Please call.

## 2014-04-02 NOTE — Telephone Encounter (Signed)
Attempted to contact pt to schedule appt.   Spoke w/ pt's husband.  He states that pt is not home and to call her cell phone.   Offered appt tomorrow at 7:45 w/ Dr. Rockey Situ, but he states that pt is "not a morning person." Attempted several times to contact pt on her cell phone, but no answer.  Left message that I am putting pt on tomorrow am, as this is Dr. Donivan Scull only opening for tomorrow.   Spoke w/ pt.  She is agreeable to being seen tomorrow am.

## 2014-04-02 NOTE — Telephone Encounter (Signed)
I'm happy to see her tomorrow I suspect that Coumadin may have caused the same issue

## 2014-04-03 ENCOUNTER — Encounter: Payer: Self-pay | Admitting: Cardiovascular Disease

## 2014-04-03 ENCOUNTER — Ambulatory Visit (INDEPENDENT_AMBULATORY_CARE_PROVIDER_SITE_OTHER): Payer: Medicare Other | Admitting: Cardiovascular Disease

## 2014-04-03 VITALS — BP 140/80 | HR 77 | Ht 67.0 in | Wt 124.2 lb

## 2014-04-03 DIAGNOSIS — I4891 Unspecified atrial fibrillation: Secondary | ICD-10-CM

## 2014-04-03 DIAGNOSIS — I872 Venous insufficiency (chronic) (peripheral): Secondary | ICD-10-CM

## 2014-04-03 DIAGNOSIS — M158 Other polyosteoarthritis: Secondary | ICD-10-CM

## 2014-04-03 DIAGNOSIS — K2970 Gastritis, unspecified, without bleeding: Secondary | ICD-10-CM

## 2014-04-03 DIAGNOSIS — E785 Hyperlipidemia, unspecified: Secondary | ICD-10-CM

## 2014-04-03 DIAGNOSIS — K2990 Gastroduodenitis, unspecified, without bleeding: Secondary | ICD-10-CM

## 2014-04-03 DIAGNOSIS — M159 Polyosteoarthritis, unspecified: Secondary | ICD-10-CM

## 2014-04-03 NOTE — Progress Notes (Signed)
Patient ID: [name], female    DOB: 1927-06-22, 80 y.o.   MRN: [id number]  HPI Comments: [name] is a 80 year old woman with history of chronic atrial fibrillation, on anticoagulation, severe arthritis, hypothyroidism, diastolic CHF who presents for routine followup  She reports that she had recent episode of vomiting last week. Symptoms were severe after eating a [place] salad. Shortly after the episode, she noticed bruising on her face bilaterally. Symptoms seemed to get worse by the next day. Symptoms occurred late at night and she did take her eliquis shortly after the episode, 2.5 mg. Since then she has been very bothered by the bruising, ecchymoses. He does not getting worse, but appears to be stable and is a bother to her cosmetically.  She continues to have severe pain all over. Discomfort in her hands, back, in her legs. She has scoliosis. Occasionally takes Percocet now that she is not on Celebrex. She feels miserable.  Previous upper GI by Dr. Earlean Shawl  01/07/2014 showing nonerosive gastritis moderate, nonerosive duodenitis mild likely from NSAID use exacerbated by anticoagulation. She was started on Prilosec 40 mg daily, told to discontinue her NSAIDs She is taking Celebrex initially 200 mg for many years, down to 100 mg recently. She is now held this medication  Previous injury to her left lower extremity, she hit the bathtub. Wounds now in her upper shin, wrap in place. Leg is mildly swollen, no swelling of the right leg  EKG shows atrial fibrillation with ventricular rate 77 beats per minute, poor R-wave progression to the anterior precordial leads, unable to exclude old anterior infarct, left axis deviation     Outpatient Encounter Prescriptions as of 04/03/2014  Medication Sig  . acetaminophen (TYLENOL) 650 MG CR tablet Take 650 mg by mouth every 8 (eight) hours as needed for pain.  Marland Kitchen apixaban (ELIQUIS) 2.5 MG TABS tablet Take 1 tablet (2.5 mg total) by mouth 2 (two)  times daily.  Marland Kitchen atenolol (TENORMIN) 25 MG tablet TAKE ONE TABLET BY MOUTH ONCE DAILY  . b complex vitamins tablet Take 1 tablet by mouth daily.    . Biotin 5000 MCG CAPS Take by mouth daily.    Marland Kitchen CALCIUM CARBONATE PO Take 1,200 mg by mouth daily.  . CHONDROITIN SULFATE PO Take 1,200 mg by mouth daily.  . cyanocobalamin 2000 MCG tablet Take 2,000 mcg by mouth daily.  . cyclobenzaprine (FLEXERIL) 5 MG tablet Take 5 mg by mouth as needed for muscle spasms.  [name] Sodium (COLACE PO) Take by mouth as needed.  . ferrous sulfate 325 (65 FE) MG tablet Take 325 mg by mouth daily with breakfast.  . furosemide (LASIX) 40 MG tablet TAKE ONE TABLETS BY MOUTH ONCE DAILY  . Glucosamine HCl (GLUCOSAMINE PO) Take 1,500 mg by mouth daily.  . Iron-Vitamin C (VITRON-C) 65-125 MG TABS Take 65 mg by mouth daily.  Marland Kitchen levothyroxine (SYNTHROID, LEVOTHROID) 75 MCG tablet TAKE ONE TABLET BY MOUTH ONCE DAILY BEFORE BREAKFAST  . Milk Thistle 1000 MG CAPS Take by mouth daily.  . Multiple Vitamins-Minerals (ONE-A-DAY 50 PLUS PO) Take by mouth daily.    . NON FORMULARY Astringent solution as needed.  Marland Kitchen omeprazole (PRILOSEC) 20 MG capsule Take 20 mg by mouth 2 (two) times daily before a meal.  . oxyCODONE-acetaminophen (PERCOCET) 10-325 MG per tablet Take 0.5-1 tablets by mouth every 4 (four) hours as needed for pain.  . Polyethylene Glycol 3350 (MIRALAX PO) Take by mouth as needed.  . triamcinolone cream (  KENALOG) 0.1 % Apply 1 application topically 2 (two) times daily.  . vitamin C (ASCORBIC ACID) 500 MG tablet Take 500 mg by mouth daily.  Marland Kitchen zinc sulfate 220 MG capsule Take 220 mg by mouth daily.     Review of Systems  Constitutional: Negative.   HENT: Negative.        Facial petechial bruising  Eyes: Negative.   Respiratory: Negative.   Cardiovascular: Negative.   Gastrointestinal: Negative.   Endocrine: Negative.   Musculoskeletal: Negative.   Skin: Negative.   Allergic/Immunologic: Negative.    Neurological: Negative.   Hematological: Negative.   Psychiatric/Behavioral: The patient is nervous/anxious.   All other systems reviewed and are negative.   BP 140/80  Pulse 77  Ht 5\' 7"  (1.702 m)  Wt 124 lb 4 oz (56.359 kg)  BMI 19.46 kg/m2  Physical Exam  Nursing note and vitals reviewed. Constitutional: She is oriented to person, place, and time. She appears well-developed and well-nourished.  Numerous small regions of petechial bruising on her face bilaterally  HENT:  Head: Normocephalic.  Nose: Nose normal.  Mouth/Throat: Oropharynx is clear and moist.  Eyes: Conjunctivae are normal. Pupils are equal, round, and reactive to light.  Neck: Normal range of motion. Neck supple. No JVD present.  Cardiovascular: Normal rate, regular rhythm, S1 normal, S2 normal, normal heart sounds and intact distal pulses.  Exam reveals no gallop and no friction rub.   No murmur heard. Pulmonary/Chest: Effort normal and breath sounds normal. No respiratory distress. She has no wheezes. She has no rales. She exhibits no tenderness.  Abdominal: Soft. Bowel sounds are normal. She exhibits no distension. There is no tenderness.  Musculoskeletal: Normal range of motion. She exhibits no edema and no tenderness.  Lymphadenopathy:    She has no cervical adenopathy.  Neurological: She is alert and oriented to person, place, and time. Coordination normal.  Skin: Skin is warm and dry. No rash noted. No erythema.  Psychiatric: She has a normal mood and affect. Her behavior is normal. Judgment and thought content normal.    Assessment and Plan

## 2014-04-03 NOTE — Patient Instructions (Signed)
You are doing well. No medication changes were made.  Please call us if you have new issues that need to be addressed before your next appt.  Your physician wants you to follow-up in: 6 months.  You will receive a reminder letter in the mail two months in advance. If you don't receive a letter, please call our office to schedule the follow-up appointment.   

## 2014-04-03 NOTE — Assessment & Plan Note (Signed)
Chronic atrial fibrillation. Long discussion with her today about anticoagulation. She will stay on her eliquis. She does not like the other options such as warfarin.

## 2014-04-03 NOTE — Assessment & Plan Note (Signed)
Recommended leg elevation, compression hose

## 2014-04-03 NOTE — Assessment & Plan Note (Signed)
Given her prior GI history, suggested she avoid NSAIDs as tolerated

## 2014-04-03 NOTE — Assessment & Plan Note (Signed)
No recent lipid panel available. We'll discuss this with her on her next visit

## 2014-04-03 NOTE — Assessment & Plan Note (Signed)
She has severe diffuse arthritis. She reports that she is scheduled to see Dr. Jefm Bryant  of rheumatology

## 2014-04-05 ENCOUNTER — Encounter: Payer: Self-pay | Admitting: Internal Medicine

## 2014-04-19 ENCOUNTER — Ambulatory Visit (INDEPENDENT_AMBULATORY_CARE_PROVIDER_SITE_OTHER): Payer: Medicare Other

## 2014-04-19 DIAGNOSIS — Z23 Encounter for immunization: Secondary | ICD-10-CM

## 2014-05-06 ENCOUNTER — Other Ambulatory Visit: Payer: Self-pay | Admitting: Internal Medicine

## 2014-05-06 MED ORDER — FUROSEMIDE 40 MG PO TABS: 40.0000 mg | ORAL_TABLET | Freq: Every day | ORAL | Status: DC | PRN

## 2014-05-06 NOTE — Telephone Encounter (Signed)
Ok to fill 

## 2014-05-06 NOTE — Telephone Encounter (Signed)
Okay #180 x 1  find out how much of this she is taking to see if we can be more specific on the directions

## 2014-05-06 NOTE — Telephone Encounter (Signed)
Once daily rx sent to pharmacy by e-script

## 2014-05-08 ENCOUNTER — Telehealth: Payer: Self-pay

## 2014-05-08 NOTE — Telephone Encounter (Signed)
Pt would like Eliquis samples.  

## 2014-05-08 NOTE — Telephone Encounter (Signed)
Notified patient Eliquis 2.5 mg samples available to pick up.

## 2014-05-28 ENCOUNTER — Encounter: Payer: Self-pay | Admitting: Internal Medicine

## 2014-06-04 ENCOUNTER — Encounter: Payer: Self-pay | Admitting: Internal Medicine

## 2014-06-04 ENCOUNTER — Ambulatory Visit (INDEPENDENT_AMBULATORY_CARE_PROVIDER_SITE_OTHER)
Admission: RE | Admit: 2014-06-04 | Discharge: 2014-06-04 | Disposition: A | Discharge status: Home / Self Care | Payer: Medicare Other | Source: Ambulatory Visit | Attending: Internal Medicine | Admitting: Internal Medicine

## 2014-06-04 ENCOUNTER — Ambulatory Visit (INDEPENDENT_AMBULATORY_CARE_PROVIDER_SITE_OTHER): Payer: Medicare Other | Admitting: Internal Medicine

## 2014-06-04 VITALS — BP 130/80 | HR 60 | Temp 97.8°F | Wt 120.0 lb

## 2014-06-04 DIAGNOSIS — R042 Hemoptysis: Secondary | ICD-10-CM

## 2014-06-04 DIAGNOSIS — M159 Polyosteoarthritis, unspecified: Secondary | ICD-10-CM

## 2014-06-04 DIAGNOSIS — Z23 Encounter for immunization: Secondary | ICD-10-CM

## 2014-06-04 DIAGNOSIS — M150 Primary generalized (osteo)arthritis: Secondary | ICD-10-CM

## 2014-06-04 NOTE — Progress Notes (Signed)
Subjective:    Patient ID: [name], female    DOB: 03/03/1927, 80 y.o.   MRN: [id number]  HPI Having stress Husband not doing well  Has coughed up blood 3 times in past few days See the emails Happens only in the morning No clear PND--but the blood seems to be in phlegm  Nose does feel dry with the heat going on  Current Outpatient Prescriptions on File Prior to Visit  Medication Sig Dispense Refill  . acetaminophen (TYLENOL) 650 MG CR tablet Take 650 mg by mouth every 8 (eight) hours as needed for pain.    Marland Kitchen apixaban (ELIQUIS) 2.5 MG TABS tablet Take 1 tablet (2.5 mg total) by mouth 2 (two) times daily. 180 tablet 4  . atenolol (TENORMIN) 25 MG tablet TAKE ONE TABLET BY MOUTH ONCE DAILY 90 tablet 0  . b complex vitamins tablet Take 1 tablet by mouth daily.      . Biotin 5000 MCG CAPS Take by mouth daily.      Marland Kitchen CALCIUM CARBONATE PO Take 1,200 mg by mouth daily.    . CHONDROITIN SULFATE PO Take 1,200 mg by mouth daily.    . cyanocobalamin 2000 MCG tablet Take 2,000 mcg by mouth daily.    . cyclobenzaprine (FLEXERIL) 5 MG tablet Take 5 mg by mouth as needed for muscle spasms.    [name] Sodium (COLACE PO) Take by mouth as needed.    . ferrous sulfate 325 (65 FE) MG tablet Take 325 mg by mouth daily with breakfast.    . furosemide (LASIX) 40 MG tablet Take 1 tablet (40 mg total) by mouth daily as needed. 90 tablet 0  . Glucosamine HCl (GLUCOSAMINE PO) Take 1,500 mg by mouth daily.    . Iron-Vitamin C (VITRON-C) 65-125 MG TABS Take 65 mg by mouth daily.    Marland Kitchen levothyroxine (SYNTHROID, LEVOTHROID) 75 MCG tablet TAKE ONE TABLET BY MOUTH ONCE DAILY BEFORE BREAKFAST 90 tablet 0  . Milk Thistle 1000 MG CAPS Take by mouth daily.    . Multiple Vitamins-Minerals (ONE-A-DAY 50 PLUS PO) Take by mouth daily.      . NON FORMULARY Astringent solution as needed.    Marland Kitchen omeprazole (PRILOSEC) 20 MG capsule Take 20 mg by mouth 2 (two) times daily before a meal.    . Polyethylene Glycol 3350  (MIRALAX PO) Take by mouth as needed.    . triamcinolone cream (KENALOG) 0.1 % Apply 1 application topically 2 (two) times daily. 454 g 1  . vitamin C (ASCORBIC ACID) 500 MG tablet Take 500 mg by mouth daily.    Marland Kitchen zinc sulfate 220 MG capsule Take 220 mg by mouth daily.     No current facility-administered medications on file prior to visit.    Allergies  Allergen Reactions  . Dabigatran Etexilate Mesylate Shortness Of Breath  . Coumadin [Warfarin Sodium] Other (See Comments)    hematoma  . Penicillins   . Pradaxa [Dabigatran Etexilate Mesylate] Other (See Comments)    SLEEP  . Sulfonamide Derivatives   . Tape Other (See Comments)    blisters  . Tramadol Hcl     Itching     Past Medical History  Diagnosis Date  . Arthritis   . Hyperlipidemia   . Hypothyroidism   . Atrial fibrillation     a. s/p prior DCCV's, now persistent-->Xarelto (CHA2DS2VASc = 3).  . Diverticulosis     a. by colonoscopy, h/o colon adenoma ([facility])  . Gastritis and duodenitis  a. 01/2014 EGD: Moderate non-erosive gastritis and mild non-erosive duodenitis.    Past Surgical History  Procedure Laterality Date  . Rotator cuff repair    . Intraocular lens implant, secondary  1996/ 2010  . Knee surgery      lateral  . Tonsillectomy    . Colonoscopy  02/2011    mod diverticulosis ([facility])  . Total knee arthroplasty Bilateral     Family History  Problem Relation Age of Onset  . Heart disease Mother     History   Social History  . Marital Status: Married    Spouse Name: N/A    Number of Children: N/A  . Years of Education: N/A   Occupational History  . retired    Social History Main Topics  . Smoking status: Former Smoker    Types: Cigarettes  . Smokeless tobacco: Never Used  . Alcohol Use: Yes  . Drug Use: No  . Sexual Activity: Not on file   Other Topics Concern  . Not on file   Social History Narrative   Regular exercise   Remains active with church and community activities        Has living will   Husband, then Barton Fanny (friend), is health care POA   Has DNR order---form done again 05/15/12   No tube feeds if cognitively unaware   Review of Systems Hasn't smoked in 40 years Not SOB No fever and doesn't feel sick    Objective:   Physical Exam  Constitutional: She appears well-developed and well-nourished. No distress.  HENT:  Mouth/Throat: Oropharynx is clear and moist. No oropharyngeal exudate.  No apparent bleeding source in nose  Neck: Normal range of motion.  Pulmonary/Chest: Effort normal and breath sounds normal. No respiratory distress. She has no wheezes. She has no rales.  Lymphadenopathy:    She has no cervical adenopathy.          Assessment & Plan:

## 2014-06-04 NOTE — Assessment & Plan Note (Signed)
Very small amount Only in AM and in the mucus and seems almost certainly from her nose Will just check CXR May need ENT if worsens (for more thorough exam)

## 2014-06-04 NOTE — Assessment & Plan Note (Signed)
Feels her hands are better since on hydroxychlorquine Discussed tylenol but no NSAIDs Not sure glucosamine is helping

## 2014-06-04 NOTE — Addendum Note (Signed)
Addended by: Despina Hidden on: 06/04/2014 01:22 PM   Modules accepted: Orders

## 2014-06-04 NOTE — Progress Notes (Signed)
Pre visit review using our clinic review tool, if applicable. No additional management support is needed unless otherwise documented below in the visit note. 

## 2014-06-17 ENCOUNTER — Other Ambulatory Visit: Payer: Self-pay | Admitting: Internal Medicine

## 2014-07-29 ENCOUNTER — Encounter: Payer: Self-pay | Admitting: Cardiovascular Disease

## 2014-07-29 ENCOUNTER — Ambulatory Visit (INDEPENDENT_AMBULATORY_CARE_PROVIDER_SITE_OTHER): Payer: Medicare Other | Admitting: Cardiovascular Disease

## 2014-07-29 VITALS — BP 128/64 | HR 91 | Ht 62.0 in | Wt 113.0 lb

## 2014-07-29 DIAGNOSIS — M150 Primary generalized (osteo)arthritis: Secondary | ICD-10-CM

## 2014-07-29 DIAGNOSIS — I4891 Unspecified atrial fibrillation: Secondary | ICD-10-CM

## 2014-07-29 DIAGNOSIS — M159 Polyosteoarthritis, unspecified: Secondary | ICD-10-CM

## 2014-07-29 DIAGNOSIS — M069 Rheumatoid arthritis, unspecified: Secondary | ICD-10-CM

## 2014-07-29 DIAGNOSIS — I872 Venous insufficiency (chronic) (peripheral): Secondary | ICD-10-CM

## 2014-07-29 DIAGNOSIS — Z6379 Other stressful life events affecting family and household: Secondary | ICD-10-CM

## 2014-07-29 NOTE — Assessment & Plan Note (Signed)
Minimal leg edema on today's visit. Appears stable

## 2014-07-29 NOTE — Patient Instructions (Signed)
You are doing well. No medication changes were made.  Please call us if you have new issues that need to be addressed before your next appt.  Your physician wants you to follow-up in: 6 months.  You will receive a reminder letter in the mail two months in advance. If you don't receive a letter, please call our office to schedule the follow-up appointment.   

## 2014-07-29 NOTE — Assessment & Plan Note (Signed)
She continues to have significant stress. Husband continues to have significant medical issues

## 2014-07-29 NOTE — Assessment & Plan Note (Signed)
Diffuse osteoarthritis. Takes Percocet/oxycodone

## 2014-07-29 NOTE — Assessment & Plan Note (Signed)
She is concerned that the methotrexate that she recently started is contributing to her facial bruising. Also recently on prednisone. Followed by rheumatology

## 2014-07-29 NOTE — Progress Notes (Signed)
Patient ID: [name], female    DOB: 13-May-1927, 81 y.o.   MRN: [id number]  HPI Comments: [name] is a 81 year old woman with history of chronic atrial fibrillation, on anticoagulation, severe arthritis, hypothyroidism, diastolic CHF who presents for routine followup of her atrial fibrillation  Today's visit, she is upset at bruising on her face. She attributes this to the blood thinner. Small regions of ecchymosis. Recently on prednisone, started on methotrexate for rheumatoid arthritis. She sees Dr. Jefm Bryant from rheumatology. She would like to decrease the dose of her blood thinner. She has been doing lots of research on the dosing. She feels that methotrexate is a blood thinner as well. Currently off prednisone. She's using makeup to cover up the bruising spots. Otherwise she feels well apart from her arthritis and back pain. Hands are greatly affected. She takes oxycodone for her back pain  EKG on today's visit shows atrial fibrillation with rate 91 bpm, left anterior fascicular block  Other past medical history Previous episode of vomitingSymptoms were severe after eating a [place] salad. Shortly after the episode, she noticed bruising on her face bilaterally. Symptoms seemed to get worse by the next day. Symptoms occurred late at night and she did take her eliquis shortly after the episode, 2.5 mg. Since then she has been very bothered by the bruising, ecchymoses.  bother to her cosmetically.  She continues to have severe pain all over. Discomfort in her hands, back, in her legs. She has scoliosis. Occasionally takes Percocet now that she is not on Celebrex.   Previous upper GI by Dr. Earlean Shawl  01/07/2014 showing nonerosive gastritis moderate, nonerosive duodenitis mild likely from NSAID use exacerbated by anticoagulation. She was started on Prilosec 40 mg daily, told to discontinue her NSAIDs She is taking Celebrex initially 200 mg for many years, down to 100 mg recently. She is  now held this medication  Previous injury to her left lower extremity, she hit the bathtub. Wounds now in her upper shin, wrap in place. Leg is mildly swollen, no swelling of the right leg   Allergies  Allergen Reactions  . Dabigatran Etexilate Mesylate Shortness Of Breath  . Coumadin [Warfarin Sodium] Other (See Comments)    hematoma  . Penicillins   . Pradaxa [Dabigatran Etexilate Mesylate] Other (See Comments)    SLEEP  . Sulfonamide Derivatives   . Tape Other (See Comments)    blisters  . Tramadol Hcl     Itching     Outpatient Encounter Prescriptions as of 07/29/2014  Medication Sig  . acetaminophen (TYLENOL) 650 MG CR tablet Take 650 mg by mouth every 8 (eight) hours as needed for pain.  Marland Kitchen apixaban (ELIQUIS) 2.5 MG TABS tablet Take 1 tablet (2.5 mg total) by mouth 2 (two) times daily.  Marland Kitchen atenolol (TENORMIN) 25 MG tablet TAKE ONE TABLET BY MOUTH ONCE DAILY  . b complex vitamins tablet Take 1 tablet by mouth daily.    . Biotin 5000 MCG CAPS Take by mouth daily.    Marland Kitchen CALCIUM CARBONATE PO Take 1,200 mg by mouth daily.  . CHONDROITIN SULFATE PO Take 1,200 mg by mouth daily.  . cyanocobalamin 2000 MCG tablet Take 2,000 mcg by mouth daily.  [name] Sodium (COLACE PO) Take by mouth as needed.  . folic acid (FOLVITE) 010 MCG tablet Take 400 mcg by mouth daily.  . furosemide (LASIX) 40 MG tablet Take 1 tablet (40 mg total) by mouth daily as needed. (Patient taking differently: Take 40 mg  by mouth daily. )  . Glucosamine HCl (GLUCOSAMINE PO) Take 1,500 mg by mouth daily.  . hydroxychloroquine (PLAQUENIL) 200 MG tablet Take 400 mg by mouth daily.   . Iron-Vitamin C (VITRON-C) 65-125 MG TABS Take 65 mg by mouth daily.  Marland Kitchen levothyroxine (SYNTHROID, LEVOTHROID) 75 MCG tablet TAKE ONE TABLET BY MOUTH ONCE DAILY BEFORE BREAKFAST  . magnesium hydroxide (MILK OF MAGNESIA) 400 MG/5ML suspension Take by mouth daily as needed for mild constipation.  . methocarbamol (ROBAXIN) 500 MG tablet Take  500 mg by mouth as needed for muscle spasms.  . methotrexate (RHEUMATREX) 2.5 MG tablet Take 6 tablets on Wednesday.  . Milk Thistle 1000 MG CAPS Take by mouth daily.  . Multiple Vitamins-Minerals (ONE-A-DAY 50 PLUS PO) Take by mouth daily.    . NON FORMULARY Astringent solution as needed.  Marland Kitchen omeprazole (PRILOSEC) 20 MG capsule Take 20 mg by mouth 2 (two) times daily before a meal.  . oxyCODONE (ROXICODONE) 15 MG immediate release tablet Take 15 mg by mouth every 4 (four) hours as needed for pain.  . Polyethylene Glycol 3350 (MIRALAX PO) Take by mouth as needed.  . triamcinolone cream (KENALOG) 0.1 % Apply 1 application topically 2 (two) times daily.  . vitamin C (ASCORBIC ACID) 500 MG tablet Take 500 mg by mouth daily.  Marland Kitchen zinc sulfate 220 MG capsule Take 220 mg by mouth daily.  . [DISCONTINUED] cyclobenzaprine (FLEXERIL) 5 MG tablet Take 5 mg by mouth as needed for muscle spasms.  . [DISCONTINUED] ferrous sulfate 325 (65 FE) MG tablet Take 325 mg by mouth daily with breakfast.    Past Medical History  Diagnosis Date  . Arthritis   . Hyperlipidemia   . Hypothyroidism   . Atrial fibrillation     a. s/p prior DCCV's, now persistent-->Xarelto (CHA2DS2VASc = 3).  . Diverticulosis     a. by colonoscopy, h/o colon adenoma ([facility])  . Gastritis and duodenitis     a. 01/2014 EGD: Moderate non-erosive gastritis and mild non-erosive duodenitis.    Past Surgical History  Procedure Laterality Date  . Rotator cuff repair    . Intraocular lens implant, secondary  1996/ 2010  . Knee surgery      lateral  . Tonsillectomy    . Colonoscopy  02/2011    mod diverticulosis ([facility])  . Total knee arthroplasty Bilateral     Social History  reports that she has quit smoking. Her smoking use included Cigarettes. She has never used smokeless tobacco. She reports that she drinks alcohol. She reports that she does not use illicit drugs.  Family History family history includes Heart disease in her  mother.   Review of Systems  Constitutional: Negative.   HENT:       Facial petechial bruising  Eyes: Negative.   Respiratory: Negative.   Cardiovascular: Negative.   Gastrointestinal: Negative.   Musculoskeletal: Negative.   Skin:       Facial bruising  Neurological: Negative.   Hematological: Negative.   Psychiatric/Behavioral: The patient is nervous/anxious.   All other systems reviewed and are negative.   BP 128/64 mmHg  Pulse 91  Ht 5\' 2"  (1.575 m)  Wt 113 lb (51.256 kg)  BMI 20.66 kg/m2  Physical Exam  Constitutional: She is oriented to person, place, and time. She appears well-developed and well-nourished.  Numerous small regions of petechial bruising on her face bilaterally  HENT:  Head: Normocephalic.  Nose: Nose normal.  Mouth/Throat: Oropharynx is clear and moist.  Eyes: Conjunctivae  are normal. Pupils are equal, round, and reactive to light.  Neck: Normal range of motion. Neck supple. No JVD present.  Cardiovascular: Normal rate, regular rhythm, S1 normal, S2 normal, normal heart sounds and intact distal pulses.  Exam reveals no gallop and no friction rub.   No murmur heard. Pulmonary/Chest: Effort normal and breath sounds normal. No respiratory distress. She has no wheezes. She has no rales. She exhibits no tenderness.  Abdominal: Soft. Bowel sounds are normal. She exhibits no distension. There is no tenderness.  Musculoskeletal: Normal range of motion. She exhibits no edema or tenderness.  Lymphadenopathy:    She has no cervical adenopathy.  Neurological: She is alert and oriented to person, place, and time. Coordination normal.  Skin: Skin is warm and dry. No rash noted. No erythema.  Psychiatric: She has a normal mood and affect. Her behavior is normal. Judgment and thought content normal.    Assessment and Plan  Nursing note and vitals reviewed.

## 2014-07-29 NOTE — Assessment & Plan Note (Signed)
Long discussion with her about anticoagulation. She has chronic atrial fibrillation. Recommended that she stay on eliquis 2.5 mg twice a day.

## 2014-07-31 ENCOUNTER — Encounter: Payer: Self-pay | Admitting: Internal Medicine

## 2014-07-31 DIAGNOSIS — R042 Hemoptysis: Secondary | ICD-10-CM

## 2014-08-01 ENCOUNTER — Ambulatory Visit (INDEPENDENT_AMBULATORY_CARE_PROVIDER_SITE_OTHER): Payer: Medicare Other | Admitting: Podiatry

## 2014-08-01 VITALS — BP 116/68 | HR 83 | Resp 16

## 2014-08-01 DIAGNOSIS — L03031 Cellulitis of right toe: Secondary | ICD-10-CM

## 2014-08-01 DIAGNOSIS — L84 Corns and callosities: Secondary | ICD-10-CM

## 2014-08-01 DIAGNOSIS — B353 Tinea pedis: Secondary | ICD-10-CM

## 2014-08-01 MED ORDER — DOXYCYCLINE HYCLATE 100 MG PO TABS: 100.0000 mg | ORAL_TABLET | Freq: Two times a day (BID) | ORAL | Status: DC

## 2014-08-01 MED ORDER — CLOTRIMAZOLE-BETAMETHASONE 1-0.05 % EX CREA: 1.0000 "application " | TOPICAL_CREAM | Freq: Two times a day (BID) | CUTANEOUS | Status: DC

## 2014-08-01 NOTE — Progress Notes (Signed)
Patient ID: [name], female   DOB: 1927-05-17, 81 y.o.   MRN: [id number]  Subjective: 81 year old female presents the office today with complaints of a blister on her right third toe which is been ongoing for the last couple of days. She states that yesterday area became more red and painful. She denies any red streaks up her foot. She denies any drainage or purulence. She has a states that she has an itchy rash on the top of her left foot. She denies any changes in shoes, detergents or any other things that would cause a reaction this time. She denies any systemic complaints as fevers, chills, nausea, vomiting. No other complaints at this time.  Objective: AAO 3, NAD DP/PT pulses palpable bilaterally, CRT less than 3 seconds Protective sensation intact with Simms once the monofilament. On the dorsal aspect of the right second digit there is evidence of drained bulla on the dorsal PIPJ. There is erythema to the digit with trace edema. There is no evidence of fluctuance or crepitus at this time. There is no ascending cellulitis. There is noted purulence/drainage expressed. There is a pre-ulcerative lesion on the dorsal aspect of the left third digit. No clinical signs of infection at this time. There is underlying hammertoe contractures of lesser digits most notably over [place] to digits.  On the dorsal aspect of the left foot along the forefoot there is dry, peeling skin was erythematous base which extends interdigitally. Subjective the area does itch. No underlying ulceration or open lesions identified.  There is venous insufficiency changes which appear be chronic bilateral lower chemise.  No other open lesions or pre-ulcer lesions identified.  No pain with calf compression, swelling, warmth, erythema.   Assessment : 81 year old female with right second digit symptomatic blister with erythema, mild edema ; pre-ulcerative lesion left third toe ; left dorsal foot rash, possible tinea versus  dermatitis   Plan : -Treatment options were discussed the patient including alternatives, risks, complications.  -At this time for the right second digit as there is increased erythema and edema compared to the other digits will treat with doxycycline. Also recommended her to continue Intermedic ointment and a Band-Aid daily. She can wash the area with antibacterial soap. Monitor any signs or symptoms of worsening infection and directed to call the office immediately should any occur or go to the ER.  -Offloading pads were dispensed for the left third digit.  -For the rash the left foot prescribed Lotrisone cream. If his son seem to help we'll switch to triamcinolone.  -Follow-up in 2 weeks or sooner should any problems arise. In the meantime, occurs call the office with any questions, concerns, change in symptoms.

## 2014-08-01 NOTE — Patient Instructions (Signed)
Monitor for any signs/symptoms of infection. Call the office immediately if any occur or go directly to the emergency room. Call with any questions/concerns.  

## 2014-08-02 ENCOUNTER — Encounter: Payer: Self-pay | Admitting: Surgery

## 2014-08-05 ENCOUNTER — Encounter: Payer: Self-pay | Admitting: Surgery

## 2014-08-06 ENCOUNTER — Encounter: Payer: Self-pay | Admitting: Surgery

## 2014-08-14 ENCOUNTER — Encounter: Payer: Self-pay | Admitting: Internal Medicine

## 2014-08-14 ENCOUNTER — Ambulatory Visit (INDEPENDENT_AMBULATORY_CARE_PROVIDER_SITE_OTHER): Payer: Medicare Other | Admitting: Internal Medicine

## 2014-08-14 VITALS — BP 118/84 | HR 92 | Temp 97.4°F | Ht 62.0 in | Wt 110.8 lb

## 2014-08-14 DIAGNOSIS — Z01812 Encounter for preprocedural laboratory examination: Secondary | ICD-10-CM | POA: Diagnosis not present

## 2014-08-14 DIAGNOSIS — R042 Hemoptysis: Secondary | ICD-10-CM

## 2014-08-14 NOTE — Patient Instructions (Signed)
Follow up with Dr. Stevenson Clinch in 2 weeks - please obtain CT Chest with contrast prior to follow up  - monitor the amount of blood you cough up, if severe then goto the ED.

## 2014-08-14 NOTE — Progress Notes (Signed)
Date: 08/14/2014  MRN# [id number] [name] Aug 08, 1926  Referring Physician: Dr. Wilhemena Durie  [name] is a 81 y.o. old female seen in consultation for intermittent hemoptysis  CC: "coughing up blood" Chief Complaint  Patient presents with  . Advice Only    coughing up blood 3 weeks ago, no SOB, f/n/v/d,     HPI:  Patient is a pleasant 81 year old female past medical history chronic atrial fibrillation, rheumatoid arthritis, hypothyroidism, diverticulosis,  History of GI bleed (was on Celebrex and Xarelto), presents today for further evaluation of intermittent episodes of hemoptysis. Patient states that she's had "mild blood in sputum" since being on a Eliquis for atrial fibrillation. Patient in March 2014 she was noted to have a GI bleed, had endoscopy done that showed nonbleeding, it was attributed to medication (Xareltoand Celebrex). She stated that time she was switched over to Eliquis for her atrial fibrillation since then she has noted mild intermittent hemoptysis. She states it is not common, is not severe or frank hemoptysis, there is no pattern to it, when it does occur it is usually in the mornings and a very small amount, she states that maybe streaks or flecks of blood mixed in with clear-white sputum. When hemoptysis does occur it is usually only one time per day for about 2-3 days and then resolve spontaneously. She denies bleeding from the gums, nose bleeds, blood in urine, and blood in stool. She was seen and evaluated by ENT, initially hemoptysis was thought to be due to upper airway bleeding, however, further evaluation by ENT showed no significant abnormalities or source of bleed.  Her most recent episode of hemoptysis was 3 weeks ago at which point her primary care physician referred him to pulmonary for further evaluation. Currently she denies any fever, chills, night sweats, rapid weight loss, severe hemoptysis, or hematemesis.   PMHX:   Past Medical History   Diagnosis Date  . Arthritis   . Hyperlipidemia   . Hypothyroidism   . Atrial fibrillation     a. s/p prior DCCV's, now persistent-->Xarelto (CHA2DS2VASc = 3).  . Diverticulosis     a. by colonoscopy, h/o colon adenoma ([facility])  . Gastritis and duodenitis     a. 01/2014 EGD: Moderate non-erosive gastritis and mild non-erosive duodenitis.   Surgical Hx:  Past Surgical History  Procedure Laterality Date  . Rotator cuff repair    . Intraocular lens implant, secondary  1996/ 2010  . Knee surgery      lateral  . Tonsillectomy    . Colonoscopy  02/2011    mod diverticulosis ([facility])  . Total knee arthroplasty Bilateral    Family Hx:  Family History  Problem Relation Age of Onset  . Heart disease Mother    Social Hx:   History  Substance Use Topics  . Smoking status: Former Smoker    Types: Cigarettes  . Smokeless tobacco: Never Used  . Alcohol Use: Yes   Medication:   Current Outpatient Rx  Name  Route  Sig  Dispense  Refill  . acetaminophen (TYLENOL) 650 MG CR tablet   Oral   Take 650 mg by mouth every 8 (eight) hours as needed for pain.         Marland Kitchen apixaban (ELIQUIS) 2.5 MG TABS tablet   Oral   Take 1 tablet (2.5 mg total) by mouth 2 (two) times daily.   180 tablet   4   . atenolol (TENORMIN) 25 MG tablet  TAKE ONE TABLET BY MOUTH ONCE DAILY   90 tablet   0   . b complex vitamins tablet   Oral   Take 1 tablet by mouth daily.           . Biotin 5000 MCG CAPS   Oral   Take by mouth daily.           Marland Kitchen CALCIUM CARBONATE PO   Oral   Take 1,200 mg by mouth daily.         . CHONDROITIN SULFATE PO   Oral   Take 1,200 mg by mouth daily.         . clotrimazole-betamethasone (LOTRISONE) cream   Topical   Apply 1 application topically 2 (two) times daily.   30 g   0   . cyanocobalamin 2000 MCG tablet   Oral   Take 2,000 mcg by mouth daily.         [name] Sodium (COLACE PO)   Oral   Take by mouth as needed.         . folic acid  (FOLVITE) 409 MCG tablet   Oral   Take 400 mcg by mouth daily.         . furosemide (LASIX) 40 MG tablet   Oral   Take 1 tablet (40 mg total) by mouth daily as needed. Patient taking differently: Take 40 mg by mouth daily.    90 tablet   0   . Glucosamine HCl (GLUCOSAMINE PO)   Oral   Take 1,500 mg by mouth daily.         . hydroxychloroquine (PLAQUENIL) 200 MG tablet   Oral   Take 400 mg by mouth daily.          . Iron-Vitamin C (VITRON-C) 65-125 MG TABS   Oral   Take 65 mg by mouth daily.         Marland Kitchen levothyroxine (SYNTHROID, LEVOTHROID) 75 MCG tablet      TAKE ONE TABLET BY MOUTH ONCE DAILY BEFORE BREAKFAST   90 tablet   0   . magnesium hydroxide (MILK OF MAGNESIA) 400 MG/5ML suspension   Oral   Take by mouth daily as needed for mild constipation.         . methocarbamol (ROBAXIN) 500 MG tablet   Oral   Take 500 mg by mouth as needed for muscle spasms.         . methotrexate (RHEUMATREX) 2.5 MG tablet      Take 6 tablets on Wednesday.         . Milk Thistle 1000 MG CAPS   Oral   Take by mouth daily.         . Multiple Vitamins-Minerals (ONE-A-DAY 50 PLUS PO)   Oral   Take by mouth daily.           . NON FORMULARY      Astringent solution as needed.         Marland Kitchen omeprazole (PRILOSEC) 20 MG capsule   Oral   Take 20 mg by mouth 2 (two) times daily before a meal.         . oxyCODONE (ROXICODONE) 15 MG immediate release tablet   Oral   Take 15 mg by mouth every 4 (four) hours as needed for pain.         . Polyethylene Glycol 3350 (MIRALAX PO)   Oral   Take by mouth as needed.         Marland Kitchen  triamcinolone cream (KENALOG) 0.1 %   Topical   Apply 1 application topically 2 (two) times daily.   454 g   1   . vitamin C (ASCORBIC ACID) 500 MG tablet   Oral   Take 500 mg by mouth daily.         Marland Kitchen zinc sulfate 220 MG capsule   Oral   Take 220 mg by mouth daily.             Allergies:  Dabigatran etexilate mesylate; Coumadin;  Penicillins; Pradaxa; Sulfonamide derivatives; Tape; and Tramadol hcl  Review of Systems: Gen:  Denies  fever, sweats, chills HEENT: Denies blurred vision, double vision, ear pain, eye pain, hearing loss, nose bleeds, sore throat Cvc:  No dizziness, chest pain or heaviness Resp:   Denies cough or sputum porduction, shortness of breath, admits to mild blood in the sputum Gi: Denies swallowing difficulty, stomach pain, nausea or vomiting, diarrhea, constipation, bowel incontinence Gu:  Denies bladder incontinence, burning urine Ext:   No Joint pain, stiffness or swelling Skin: No skin rash,  or hives, admits to easy bruising or bleeding Endoc:  No polyuria, polydipsia , polyphagia or weight change Psych: No depression, insomnia or hallucinations  Other:  All other systems negative  Physical Examination:   VS: There were no vitals taken for this visit.  General Appearance: No distress  Neuro:without focal findings, mental status, speech normal, alert and oriented, cranial nerves 2-12 intact, reflexes normal and symmetric, sensation grossly normal  HEENT: PERRLA, EOM intact, no ptosis, no other lesions noticed; Mallampati 1 Pulmonary: normal breath sounds., diaphragmatic excursion normal.No wheezing, No rales;   Sputum Production:   CardiovascularNormal S1,S2.  No m/r/g.  Abdominal aorta pulsation normal.    Abdomen: Benign, Soft, non-tender, No masses, hepatosplenomegaly, No lymphadenopathy Renal:  No costovertebral tenderness  GU:  No performed at this time. Endoc: No evident thyromegaly, no signs of acromegaly or Cushing features Skin:   warm, no rashes, no ecchymosis  Extremities: normal, no cyanosis, clubbing, warm with normal capillary refill. Other findings:currently with compression stockings on, mild trace edema b\l LE.   Labs results:  Results for [name], [name] (MRN [id number]) as of 08/14/2014 17:16  Ref. Range 03/07/2014 14:30  WBC Latest Range: 3.4-10.8 x10E3/uL 5.8  RBC  Latest Range: 3.77-5.28 x10E6/uL 3.77  Hemoglobin Latest Range: 11.1-15.9 g/dL 10.6 (L)  HCT Latest Range: 34.0-46.6 % 33.1 (L)  MCV Latest Range: 79-97 fL 88  MCH Latest Range: 26.6-33.0 pg 28.1  MCHC Latest Range: 31.5-35.7 g/dL 32.0  RDW Latest Range: 12.3-15.4 % 16.7 (H)  Platelets Latest Range: 150-379 x10E3/uL 349   Rad results: (The following images and results were reviewed by Dr. Stevenson Clinch). 06/04/14 FINDINGS: The cardiac silhouette is enlarged. The mediastinal contours are within normal limits. Atherosclerotic aortic calcifications are present.  There is no focal airspace consolidation, pleural effusion or pneumothorax.  There is no overt pulmonary edema.  No acute osseous abnormality is identified. Thoracolumbar scoliosis is present. Metallic anchors overlie the right humerus.  The partially visualized abdomen is unremarkable.  IMPRESSION: Cardiomegaly without overt pulmonary edema or focal airspace consolidation.     Assessment and Plan: Hemoptysis DDx - medication induced, anticoagulation,weakend mucosal integrity from being on anticoagulation  Based on history and clinical symptoms patient is having very mild hemoptysis, questioning if this is actually hemoptysis versus a GI source. Blood admixed sputum is very mild, not daily, and mainly in the mornings. She is not having more and 250 mL of  blood in her cough per day, nor is she anywhere close to that number.  Patient does have a history of a GI bleed while being on anticoagulation.  Plan -Continue with current anticoagulation for atrial fibrillation -Given the patient is not having severe hemoptysis on a daily or regular basis at this time will defer to CT scan of chest with contrast. -No strong indication for bronchoscopy at this time, however, if the abnormalities are noted on the CT scan will further discuss bronchoscopy with the patient. -Follow up in 2 weeks     Updated Medication  List Outpatient Encounter Prescriptions as of 08/14/2014  Medication Sig  . acetaminophen (TYLENOL) 650 MG CR tablet Take 650 mg by mouth every 8 (eight) hours as needed for pain.  Marland Kitchen apixaban (ELIQUIS) 2.5 MG TABS tablet Take 1 tablet (2.5 mg total) by mouth 2 (two) times daily.  Marland Kitchen atenolol (TENORMIN) 25 MG tablet TAKE ONE TABLET BY MOUTH ONCE DAILY  . b complex vitamins tablet Take 1 tablet by mouth daily.    . Biotin 5000 MCG CAPS Take by mouth daily.    Marland Kitchen CALCIUM CARBONATE PO Take 1,200 mg by mouth daily.  . CHONDROITIN SULFATE PO Take 1,200 mg by mouth daily.  . clotrimazole-betamethasone (LOTRISONE) cream Apply 1 application topically 2 (two) times daily.  . cyanocobalamin 2000 MCG tablet Take 2,000 mcg by mouth daily.  [name] Sodium (COLACE PO) Take by mouth as needed.  . folic acid (FOLVITE) 299 MCG tablet Take 400 mcg by mouth daily.  . furosemide (LASIX) 40 MG tablet Take 1 tablet (40 mg total) by mouth daily as needed. (Patient taking differently: Take 40 mg by mouth daily. )  . Glucosamine HCl (GLUCOSAMINE PO) Take 1,500 mg by mouth daily.  . hydroxychloroquine (PLAQUENIL) 200 MG tablet Take 400 mg by mouth daily.   . Iron-Vitamin C (VITRON-C) 65-125 MG TABS Take 65 mg by mouth daily.  Marland Kitchen levothyroxine (SYNTHROID, LEVOTHROID) 75 MCG tablet TAKE ONE TABLET BY MOUTH ONCE DAILY BEFORE BREAKFAST  . magnesium hydroxide (MILK OF MAGNESIA) 400 MG/5ML suspension Take by mouth daily as needed for mild constipation.  . methocarbamol (ROBAXIN) 500 MG tablet Take 500 mg by mouth as needed for muscle spasms.  . methotrexate (RHEUMATREX) 2.5 MG tablet Take 6 tablets on Wednesday.  . Milk Thistle 1000 MG CAPS Take by mouth daily.  . Multiple Vitamins-Minerals (ONE-A-DAY 50 PLUS PO) Take by mouth daily.    . NON FORMULARY Astringent solution as needed.  Marland Kitchen omeprazole (PRILOSEC) 20 MG capsule Take 20 mg by mouth 2 (two) times daily before a meal.  . oxyCODONE (ROXICODONE) 15 MG immediate release  tablet Take 15 mg by mouth every 4 (four) hours as needed for pain.  . Polyethylene Glycol 3350 (MIRALAX PO) Take by mouth as needed.  . triamcinolone cream (KENALOG) 0.1 % Apply 1 application topically 2 (two) times daily.  . vitamin C (ASCORBIC ACID) 500 MG tablet Take 500 mg by mouth daily.  Marland Kitchen zinc sulfate 220 MG capsule Take 220 mg by mouth daily.  . [DISCONTINUED] doxycycline (VIBRA-TABS) 100 MG tablet Take 1 tablet (100 mg total) by mouth 2 (two) times daily. (Patient not taking: Reported on 08/14/2014)    Orders for this visit: Orders Placed This Encounter  Procedures  . CT Chest W Contrast    Standing Status: Future     Number of Occurrences:      Standing Expiration Date: 10/13/2015    Scheduling Instructions:  Dr. Weber Cooks or Rosario Jacks to read.    Order Specific Question:  Reason for Exam (SYMPTOM  OR DIAGNOSIS REQUIRED)    Answer:  hemopytosis    Order Specific Question:  Preferred imaging location?    Answer:  [facility]     Thank  you for the consultation and for allowing [facility] to assist in the care of your patient. Our recommendations are noted above.  Please contact us if we can be of further service.   Vilinda Boehringer, MD [facility] Office Number: [phone]

## 2014-08-14 NOTE — Assessment & Plan Note (Signed)
DDx - medication induced, anticoagulation,weakend mucosal integrity from being on anticoagulation  Based on history and clinical symptoms patient is having very mild hemoptysis, questioning if this is actually hemoptysis versus a GI source. Blood admixed sputum is very mild, not daily, and mainly in the mornings. She is not having more and 250 mL of blood in her cough per day, nor is she anywhere close to that number.  Patient does have a history of a GI bleed while being on anticoagulation.  Plan -Continue with current anticoagulation for atrial fibrillation -Given the patient is not having severe hemoptysis on a daily or regular basis at this time will defer to CT scan of chest with contrast. -No strong indication for bronchoscopy at this time, however, if the abnormalities are noted on the CT scan will further discuss bronchoscopy with the patient. -Follow up in 2 weeks

## 2014-08-15 ENCOUNTER — Other Ambulatory Visit: Payer: Self-pay | Admitting: Internal Medicine

## 2014-08-15 DIAGNOSIS — R042 Hemoptysis: Secondary | ICD-10-CM

## 2014-08-15 NOTE — Addendum Note (Signed)
Addended by: Devona Konig on: 08/15/2014 11:30 AM   Modules accepted: Orders

## 2014-08-15 NOTE — Addendum Note (Signed)
Addended by: Devona Konig on: 08/15/2014 11:26 AM   Modules accepted: Orders

## 2014-08-20 ENCOUNTER — Ambulatory Visit (INDEPENDENT_AMBULATORY_CARE_PROVIDER_SITE_OTHER): Payer: Medicare Other | Admitting: Internal Medicine

## 2014-08-20 ENCOUNTER — Encounter: Payer: Self-pay | Admitting: Internal Medicine

## 2014-08-20 ENCOUNTER — Ambulatory Visit: Payer: Medicare Other | Admitting: Podiatry

## 2014-08-20 VITALS — BP 122/70 | HR 72 | Temp 97.8°F | Resp 14 | Ht 62.0 in | Wt 115.2 lb

## 2014-08-20 DIAGNOSIS — L03115 Cellulitis of right lower limb: Secondary | ICD-10-CM

## 2014-08-20 MED ORDER — CEPHALEXIN 500 MG PO CAPS: 500.0000 mg | ORAL_CAPSULE | Freq: Four times a day (QID) | ORAL | Status: DC

## 2014-08-20 MED ORDER — CEFTRIAXONE SODIUM 1 G IJ SOLR
1.0000 g | Freq: Once | INTRAMUSCULAR | Status: AC
  Administered 2014-08-20: 1 g via INTRAMUSCULAR

## 2014-08-20 NOTE — Assessment & Plan Note (Signed)
Fairly extensive involvement of calf Will give rocephin and then continue with keflex (can take cephalosporin despite the PCN allergy) Can use heat and elevation as well

## 2014-08-20 NOTE — Addendum Note (Signed)
Addended by: Emelia Salisbury C on: 08/20/2014 01:50 PM   Modules accepted: Orders

## 2014-08-20 NOTE — Progress Notes (Signed)
Subjective:    Patient ID: [name], female    DOB: December 05, 1926, 81 y.o.   MRN: [id number]  HPI Here due to concerns about her leg  Bumped right leg when falling up stairs due to sore toe-- a little over 2 weeks ago Had  hematoma under the surface Went to wound center Seemed to be doing well until she took off thick compression hose last night Now angry  Has 2 ulcers from this--- the top one opening but without blood  No fever Some itching in leg and "it is angry"  Current Outpatient Prescriptions on File Prior to Visit  Medication Sig Dispense Refill  . acetaminophen (TYLENOL) 650 MG CR tablet Take 650 mg by mouth every 8 (eight) hours as needed for pain.    Marland Kitchen apixaban (ELIQUIS) 2.5 MG TABS tablet Take 1 tablet (2.5 mg total) by mouth 2 (two) times daily. 180 tablet 4  . atenolol (TENORMIN) 25 MG tablet TAKE ONE TABLET BY MOUTH ONCE DAILY 90 tablet 0  . b complex vitamins tablet Take 1 tablet by mouth daily.      . Biotin 5000 MCG CAPS Take by mouth daily.      Marland Kitchen CALCIUM CARBONATE PO Take 1,200 mg by mouth daily.    . CHONDROITIN SULFATE PO Take 1,200 mg by mouth daily.    . clotrimazole-betamethasone (LOTRISONE) cream Apply 1 application topically 2 (two) times daily. 30 g 0  . cyanocobalamin 2000 MCG tablet Take 2,000 mcg by mouth daily.    [name] Sodium (COLACE PO) Take by mouth as needed.    . folic acid (FOLVITE) 409 MCG tablet Take 400 mcg by mouth daily.    . furosemide (LASIX) 40 MG tablet Take 1 tablet (40 mg total) by mouth daily as needed. (Patient taking differently: Take 40 mg by mouth daily. ) 90 tablet 0  . Glucosamine HCl (GLUCOSAMINE PO) Take 1,500 mg by mouth daily.    . hydroxychloroquine (PLAQUENIL) 200 MG tablet Take 400 mg by mouth daily.     . Iron-Vitamin C (VITRON-C) 65-125 MG TABS Take 65 mg by mouth daily.    Marland Kitchen levothyroxine (SYNTHROID, LEVOTHROID) 75 MCG tablet TAKE ONE TABLET BY MOUTH ONCE DAILY BEFORE BREAKFAST 90 tablet 0  . magnesium  hydroxide (MILK OF MAGNESIA) 400 MG/5ML suspension Take by mouth daily as needed for mild constipation.    . methocarbamol (ROBAXIN) 500 MG tablet Take 500 mg by mouth as needed for muscle spasms.    . methotrexate (RHEUMATREX) 2.5 MG tablet Take 8 tablets on Wednesday.    . Milk Thistle 1000 MG CAPS Take by mouth daily.    . Multiple Vitamins-Minerals (ONE-A-DAY 50 PLUS PO) Take by mouth daily.      . NON FORMULARY Astringent solution as needed.    Marland Kitchen omeprazole (PRILOSEC) 20 MG capsule Take 20 mg by mouth 2 (two) times daily before a meal.    . oxyCODONE (ROXICODONE) 15 MG immediate release tablet Take 15 mg by mouth every 4 (four) hours as needed for pain.    . Polyethylene Glycol 3350 (MIRALAX PO) Take by mouth as needed.    . triamcinolone cream (KENALOG) 0.1 % Apply 1 application topically 2 (two) times daily. 454 g 1  . vitamin C (ASCORBIC ACID) 500 MG tablet Take 500 mg by mouth daily.    Marland Kitchen zinc sulfate 220 MG capsule Take 220 mg by mouth daily.     No current facility-administered medications on file prior  to visit.    Allergies  Allergen Reactions  . Dabigatran Etexilate Mesylate Shortness Of Breath  . Coumadin [Warfarin Sodium] Other (See Comments)    hematoma  . Penicillins   . Pradaxa [Dabigatran Etexilate Mesylate] Other (See Comments)    SLEEP  . Sulfonamide Derivatives   . Tape Other (See Comments)    blisters  . Tramadol Hcl     Itching     Past Medical History  Diagnosis Date  . Arthritis   . Hyperlipidemia   . Hypothyroidism   . Atrial fibrillation     a. s/p prior DCCV's, now persistent-->Xarelto (CHA2DS2VASc = 3).  . Diverticulosis     a. by colonoscopy, h/o colon adenoma ([facility])  . Gastritis and duodenitis     a. 01/2014 EGD: Moderate non-erosive gastritis and mild non-erosive duodenitis.    Past Surgical History  Procedure Laterality Date  . Rotator cuff repair    . Intraocular lens implant, secondary  1996/ 2010  . Knee surgery      lateral  .  Tonsillectomy    . Colonoscopy  02/2011    mod diverticulosis ([facility])  . Total knee arthroplasty Bilateral     Family History  Problem Relation Age of Onset  . Heart disease Mother     History   Social History  . Marital Status: Married    Spouse Name: N/A  . Number of Children: N/A  . Years of Education: N/A   Occupational History  . retired    Social History Main Topics  . Smoking status: Former Smoker    Types: Cigarettes  . Smokeless tobacco: Never Used  . Alcohol Use: Yes  . Drug Use: No  . Sexual Activity: Not on file   Other Topics Concern  . Not on file   Social History Narrative   Regular exercise   Remains active with church and community activities      Has living will   Husband, then Barton Fanny (friend), is health care POA   Has DNR order---form done again 05/15/12   No tube feeds if cognitively unaware   Review of Systems  No N/V Appetite is off     Objective:   Physical Exam  Constitutional: She appears well-developed and well-nourished. No distress.  Skin:  1 granulated ulcer (black) and 1 open area with granulation. It is hot and red around this and down the leg          Assessment & Plan:

## 2014-08-22 ENCOUNTER — Encounter: Payer: Self-pay | Admitting: Family Medicine

## 2014-08-22 ENCOUNTER — Ambulatory Visit: Payer: Medicare Other | Admitting: Podiatry

## 2014-08-22 ENCOUNTER — Ambulatory Visit: Payer: Self-pay | Admitting: Internal Medicine

## 2014-08-22 ENCOUNTER — Ambulatory Visit (INDEPENDENT_AMBULATORY_CARE_PROVIDER_SITE_OTHER): Payer: Medicare Other | Admitting: Family Medicine

## 2014-08-22 VITALS — BP 122/64 | HR 86 | Temp 97.7°F | Wt 113.5 lb

## 2014-08-22 DIAGNOSIS — L03115 Cellulitis of right lower limb: Secondary | ICD-10-CM

## 2014-08-22 MED ORDER — DOXYCYCLINE HYCLATE 100 MG PO CAPS: 100.0000 mg | ORAL_CAPSULE | Freq: Two times a day (BID) | ORAL | Status: DC

## 2014-08-22 MED ORDER — CEFTRIAXONE SODIUM 1 G IJ SOLR
1.0000 g | Freq: Once | INTRAMUSCULAR | Status: AC
  Administered 2014-08-22: 1 g via INTRAMUSCULAR

## 2014-08-22 NOTE — Progress Notes (Signed)
Pre visit review using our clinic review tool, if applicable. No additional management support is needed unless otherwise documented below in the visit note. 

## 2014-08-22 NOTE — Progress Notes (Signed)
BP 122/64 mmHg  Pulse 86  Temp(Src) 97.7 F (36.5 C) (Oral)  Wt 113 lb 8 oz (51.483 kg)   CC: f/u cellulitis  Subjective:    Patient ID: [name], female    DOB: 1927/06/17, 81 y.o.   MRN: [id number]  HPI: [name] is a 81 y.o. female presenting on 08/22/2014 for Wound Check   Seen 08/20/2014 with concern for R leg cellulitis after bumping leg when falling up stairs 2 wks prior. Initially with hematoma, seen at wound center for 2 ulcers that developed. 2d ago concern for developing cellulitis, treated with CTX IM 1gm + started on oral keflex 500mg  QID. Presents today for f/u. Persistent warmth and redness - may actually be a bit less swollen. Redness still present but doesn't seem to be spreading. When she awakens at night time feels pain/pressure of leg. Tolerating keflex well.   Denies fevers/chills, spreading redness, nausea.   She is on eliquis 2.5mg  bid blood thinner for atrial fibrillation.  Relevant past medical, surgical, family and social history reviewed and updated as indicated. Interim medical history since our last visit reviewed. Allergies and medications reviewed and updated. Current Outpatient Prescriptions on File Prior to Visit  Medication Sig  . acetaminophen (TYLENOL) 650 MG CR tablet Take 650 mg by mouth every 8 (eight) hours as needed for pain.  Marland Kitchen apixaban (ELIQUIS) 2.5 MG TABS tablet Take 1 tablet (2.5 mg total) by mouth 2 (two) times daily.  Marland Kitchen atenolol (TENORMIN) 25 MG tablet TAKE ONE TABLET BY MOUTH ONCE DAILY  . b complex vitamins tablet Take 1 tablet by mouth daily.    . Biotin 5000 MCG CAPS Take by mouth daily.    Marland Kitchen CALCIUM CARBONATE PO Take 1,200 mg by mouth daily.  . cephALEXin (KEFLEX) 500 MG capsule Take 1 capsule (500 mg total) by mouth 4 (four) times daily.  . CHONDROITIN SULFATE PO Take 1,200 mg by mouth daily.  . clotrimazole-betamethasone (LOTRISONE) cream Apply 1 application topically 2 (two) times daily.  . cyanocobalamin 2000 MCG  tablet Take 2,000 mcg by mouth daily.  [name] Sodium (COLACE PO) Take by mouth as needed.  . folic acid (FOLVITE) 597 MCG tablet Take 400 mcg by mouth daily.  . furosemide (LASIX) 40 MG tablet Take 1 tablet (40 mg total) by mouth daily as needed. (Patient taking differently: Take 40 mg by mouth daily. )  . Glucosamine HCl (GLUCOSAMINE PO) Take 1,500 mg by mouth daily.  . hydroxychloroquine (PLAQUENIL) 200 MG tablet Take 400 mg by mouth daily.   . Iron-Vitamin C (VITRON-C) 65-125 MG TABS Take 65 mg by mouth daily.  Marland Kitchen levothyroxine (SYNTHROID, LEVOTHROID) 75 MCG tablet TAKE ONE TABLET BY MOUTH ONCE DAILY BEFORE BREAKFAST  . magnesium hydroxide (MILK OF MAGNESIA) 400 MG/5ML suspension Take by mouth daily as needed for mild constipation.  . methocarbamol (ROBAXIN) 500 MG tablet Take 500 mg by mouth as needed for muscle spasms.  . methotrexate (RHEUMATREX) 2.5 MG tablet Take 8 tablets on Wednesday.  . Milk Thistle 1000 MG CAPS Take by mouth daily.  . Multiple Vitamins-Minerals (ONE-A-DAY 50 PLUS PO) Take by mouth daily.    . NON FORMULARY Astringent solution as needed.  Marland Kitchen omeprazole (PRILOSEC) 20 MG capsule Take 20 mg by mouth 2 (two) times daily before a meal.  . oxyCODONE (ROXICODONE) 15 MG immediate release tablet Take 15 mg by mouth every 4 (four) hours as needed for pain.  . Polyethylene Glycol 3350 (MIRALAX PO) Take  by mouth as needed.  . triamcinolone cream (KENALOG) 0.1 % Apply 1 application topically 2 (two) times daily.  . vitamin C (ASCORBIC ACID) 500 MG tablet Take 500 mg by mouth daily.  Marland Kitchen zinc sulfate 220 MG capsule Take 220 mg by mouth daily.   No current facility-administered medications on file prior to visit.   Past Medical History  Diagnosis Date  . Arthritis   . Hyperlipidemia   . Hypothyroidism   . Atrial fibrillation     a. s/p prior DCCV's, now persistent-->Xarelto (CHA2DS2VASc = 3).  . Diverticulosis     a. by colonoscopy, h/o colon adenoma ([facility])  .  Gastritis and duodenitis     a. 01/2014 EGD: Moderate non-erosive gastritis and mild non-erosive duodenitis.    Past Surgical History  Procedure Laterality Date  . Rotator cuff repair    . Intraocular lens implant, secondary  1996/ 2010  . Knee surgery      lateral  . Tonsillectomy    . Colonoscopy  02/2011    mod diverticulosis ([facility])  . Total knee arthroplasty Bilateral    Review of Systems Per HPI unless specifically indicated above     Objective:    BP 122/64 mmHg  Pulse 86  Temp(Src) 97.7 F (36.5 C) (Oral)  Wt 113 lb 8 oz (51.483 kg)  Wt Readings from Last 3 Encounters:  08/22/14 113 lb 8 oz (51.483 kg)  08/20/14 115 lb 4 oz (52.277 kg)  08/14/14 110 lb 12.8 oz (50.259 kg)    Physical Exam  Constitutional: She appears well-developed and well-nourished. No distress.  Musculoskeletal: She exhibits edema (mild 1+ on right, none on left).  Skin: There is erythema.  Erythema spreading to knee, checked with PCP who saw her last, may have spread some. 1 black ulcer and 1 open area with granulation superior. Some swelling of R leg as compared to left. Mildly warm around wounds.  Nursing note and vitals reviewed.  Results for orders placed or performed in visit on 03/07/14  CBC With differential/Platelet  Result Value Ref Range   WBC 5.8 3.4 - 10.8 x10E3/uL   RBC 3.77 3.77 - 5.28 x10E6/uL   Hemoglobin 10.6 (L) 11.1 - 15.9 g/dL   HCT 33.1 (L) 34.0 - 46.6 %   MCV 88 79 - 97 fL   MCH 28.1 26.6 - 33.0 pg   MCHC 32.0 31.5 - 35.7 g/dL   RDW 16.7 (H) 12.3 - 15.4 %   Platelets 349 150 - 379 x10E3/uL   Neutrophils Relative % 63 %   Lymphs 24 %   Monocytes 12 %   Eos 0 %   Basos 1 %   Neutrophils Absolute 3.7 1.4 - 7.0 x10E3/uL   Lymphocytes Absolute 1.4 0.7 - 3.1 x10E3/uL   Monocytes Absolute 0.7 0.1 - 0.9 x10E3/uL   Eosinophils Absolute 0.0 0.0 - 0.4 x10E3/uL   Basophils Absolute 0.0 0.0 - 0.2 x10E3/uL   Immature Granulocytes 0 %   Immature Grans (Abs) 0.0 0.0 -  0.1 T01S0/FU  Basic Metabolic Panel (BMET)  Result Value Ref Range   Glucose 95 65 - 99 mg/dL   BUN 27 8 - 27 mg/dL   Creatinine, Ser 1.09 (H) 0.57 - 1.00 mg/dL   GFR calc non Af Amer 46 (L) >59 mL/min/1.73   GFR calc Af Amer 53 (L) >59 mL/min/1.73   BUN/Creatinine Ratio 25 11 - 26   Sodium 140 134 - 144 mmol/L   Potassium 5.3 (H) 3.5 - 5.2  mmol/L   Chloride 96 (L) 97 - 108 mmol/L   CO2 24 18 - 29 mmol/L   Calcium 9.8 8.7 - 10.3 mg/dL      Assessment & Plan:   Problem List Items Addressed This Visit    Cellulitis of leg, right - Primary    Double checked with PCP who saw her 2d ago. Erythema may have spread some Will rpt CTX 1gm IM and add on doxy to cover MRSA. Recheck tomorrow. Area delineated.          Follow up plan: Return in about 1 day (around 08/23/2014), or if symptoms worsen or fail to improve, for follow up visit.

## 2014-08-22 NOTE — Assessment & Plan Note (Addendum)
Double checked with PCP who saw her 2d ago. Erythema may have spread some Will rpt CTX 1gm IM and add on doxy to cover MRSA. Recheck tomorrow. Area delineated.

## 2014-08-22 NOTE — Patient Instructions (Addendum)
I think cellulitis is looking ok, but we have given you another rocephin shot and continue keflex at home. Start doxycycline twice daily for 10 days Return tomorrow afternoon for recheck.  Redness delineated.

## 2014-08-22 NOTE — Addendum Note (Signed)
Addended by: Royann Shivers A on: 08/22/2014 01:50 PM   Modules accepted: Orders

## 2014-08-23 ENCOUNTER — Ambulatory Visit (INDEPENDENT_AMBULATORY_CARE_PROVIDER_SITE_OTHER): Payer: Medicare Other | Admitting: Family Medicine

## 2014-08-23 ENCOUNTER — Encounter: Payer: Self-pay | Admitting: Family Medicine

## 2014-08-23 VITALS — BP 124/76 | HR 80 | Temp 97.8°F

## 2014-08-23 DIAGNOSIS — L03115 Cellulitis of right lower limb: Secondary | ICD-10-CM

## 2014-08-23 NOTE — Progress Notes (Signed)
BP 124/76 mmHg  Pulse 80  Temp(Src) 97.8 F (36.6 C) (Oral)   CC: f/u cellulitis R leg  Subjective:    Patient ID: [name], female    DOB: 1926-10-11, 81 y.o.   MRN: [id number]  HPI: [name] is a 81 y.o. female presenting on 08/23/2014 for Follow-up   See prior notes for details.  Seen 08/20/2014 with concern for R leg cellulitis after bumping leg when falling up stairs 2 wks prior. Treated with IM Rocephin 1gm and keflex 500mg  QID. Returned yesterday for f/u. At that time erythema may have spread some so she was given another rocephin 1gm IM shot and started on doxycycline to cover MRSA. Presents today for recheck.   Denies fevers/chills, spreading redness, nausea.  She is on eliquis 2.5mg  bid blood thinner for atrial fibrillation.  Relevant past medical, surgical, family and social history reviewed and updated as indicated. Interim medical history since our last visit reviewed. Allergies and medications reviewed and updated. Current Outpatient Prescriptions on File Prior to Visit  Medication Sig  . acetaminophen (TYLENOL) 650 MG CR tablet Take 650 mg by mouth every 8 (eight) hours as needed for pain.  Marland Kitchen apixaban (ELIQUIS) 2.5 MG TABS tablet Take 1 tablet (2.5 mg total) by mouth 2 (two) times daily.  Marland Kitchen atenolol (TENORMIN) 25 MG tablet TAKE ONE TABLET BY MOUTH ONCE DAILY  . b complex vitamins tablet Take 1 tablet by mouth daily.    . Biotin 5000 MCG CAPS Take by mouth daily.    Marland Kitchen CALCIUM CARBONATE PO Take 1,200 mg by mouth daily.  . cephALEXin (KEFLEX) 500 MG capsule Take 1 capsule (500 mg total) by mouth 4 (four) times daily.  . CHONDROITIN SULFATE PO Take 1,200 mg by mouth daily.  . clotrimazole-betamethasone (LOTRISONE) cream Apply 1 application topically 2 (two) times daily.  . cyanocobalamin 2000 MCG tablet Take 2,000 mcg by mouth daily.  [name] Sodium (COLACE PO) Take by mouth as needed.  . doxycycline (VIBRAMYCIN) 100 MG capsule Take 1 capsule (100 mg  total) by mouth 2 (two) times daily.  . folic acid (FOLVITE) 229 MCG tablet Take 400 mcg by mouth daily.  . furosemide (LASIX) 40 MG tablet Take 1 tablet (40 mg total) by mouth daily as needed. (Patient taking differently: Take 40 mg by mouth daily. )  . Glucosamine HCl (GLUCOSAMINE PO) Take 1,500 mg by mouth daily.  . hydroxychloroquine (PLAQUENIL) 200 MG tablet Take 400 mg by mouth daily.   . Iron-Vitamin C (VITRON-C) 65-125 MG TABS Take 65 mg by mouth daily.  Marland Kitchen levothyroxine (SYNTHROID, LEVOTHROID) 75 MCG tablet TAKE ONE TABLET BY MOUTH ONCE DAILY BEFORE BREAKFAST  . magnesium hydroxide (MILK OF MAGNESIA) 400 MG/5ML suspension Take by mouth daily as needed for mild constipation.  . methocarbamol (ROBAXIN) 500 MG tablet Take 500 mg by mouth as needed for muscle spasms.  . methotrexate (RHEUMATREX) 2.5 MG tablet Take 8 tablets on Wednesday.  . Milk Thistle 1000 MG CAPS Take by mouth daily.  . Multiple Vitamins-Minerals (ONE-A-DAY 50 PLUS PO) Take by mouth daily.    . NON FORMULARY Astringent solution as needed.  Marland Kitchen omeprazole (PRILOSEC) 20 MG capsule Take 20 mg by mouth 2 (two) times daily before a meal.  . oxyCODONE (ROXICODONE) 15 MG immediate release tablet Take 15 mg by mouth every 4 (four) hours as needed for pain.  . Polyethylene Glycol 3350 (MIRALAX PO) Take by mouth as needed.  . triamcinolone cream (KENALOG) 0.1 %  Apply 1 application topically 2 (two) times daily.  . vitamin C (ASCORBIC ACID) 500 MG tablet Take 500 mg by mouth daily.  Marland Kitchen zinc sulfate 220 MG capsule Take 220 mg by mouth daily.   No current facility-administered medications on file prior to visit.    Review of Systems Per HPI unless specifically indicated above     Objective:    BP 124/76 mmHg  Pulse 80  Temp(Src) 97.8 F (36.6 C) (Oral)  Wt Readings from Last 3 Encounters:  08/22/14 113 lb 8 oz (51.483 kg)  08/20/14 115 lb 4 oz (52.277 kg)  08/14/14 110 lb 12.8 oz (50.259 kg)    Physical Exam    Constitutional: She appears well-developed and well-nourished. No distress.  Musculoskeletal: She exhibits edema (1+ on right, none on left).  Chronic hyperpigmented skin changes bilateral lower legs  Skin: There is erythema.  Less angry erythema with some recession below R knee Ulcers covered by bandages   Nursing note and vitals reviewed.     Assessment & Plan:   Problem List Items Addressed This Visit    Cellulitis of leg, right - Primary    Overall improving. Continue doxy/keflex course. Encouraged continued 8-63mmHg compression stockings which she has and elevation of legs. Pt feels more comfortable if she can recheck with [place] next week - would like to return Thurs afternoon with PCP.          Follow up plan: Return in about 6 days (around 08/29/2014), or as needed, for follow up visit.

## 2014-08-23 NOTE — Assessment & Plan Note (Signed)
Overall improving. Continue doxy/keflex course. Encouraged continued 8-54mmHg compression stockings which she has and elevation of legs. Pt feels more comfortable if she can recheck with [place] next week - would like to return Thurs afternoon with PCP.

## 2014-08-23 NOTE — Progress Notes (Signed)
Pre visit review using our clinic review tool, if applicable. No additional management support is needed unless otherwise documented below in the visit note. 

## 2014-08-23 NOTE — Patient Instructions (Addendum)
Redness is starting to recede. I think we are on the right track. Let us know if spreading redness or worsening pain or swelling. Keep leg up as much as able, continue low-compression compression stocking. Good to see you today, call us with questions. Return Thursday afternoon with Dr Silvio Pate.

## 2014-08-27 ENCOUNTER — Ambulatory Visit: Payer: Medicare Other | Admitting: Podiatry

## 2014-08-29 ENCOUNTER — Encounter: Payer: Self-pay | Admitting: Internal Medicine

## 2014-08-29 ENCOUNTER — Ambulatory Visit (INDEPENDENT_AMBULATORY_CARE_PROVIDER_SITE_OTHER): Payer: Medicare Other | Admitting: Internal Medicine

## 2014-08-29 VITALS — BP 98/68 | HR 77 | Temp 97.5°F | Wt 113.8 lb

## 2014-08-29 DIAGNOSIS — L03115 Cellulitis of right lower limb: Secondary | ICD-10-CM

## 2014-08-29 NOTE — Progress Notes (Signed)
Subjective:    Patient ID: [name], female    DOB: 1926/10/01, 81 y.o.   MRN: [id number]  HPI Here for follow up of cellulitis of leg  Has been finishing the course of antibiotics Doxy really affecting her gut Had bad swelling in leg 2 days after Dr Synthia Innocent second visit---hard even walking  Not really painful Redness is better Feels better with light compression hose  No fever  Current Outpatient Prescriptions on File Prior to Visit  Medication Sig Dispense Refill  . acetaminophen (TYLENOL) 650 MG CR tablet Take 650 mg by mouth every 8 (eight) hours as needed for pain.    Marland Kitchen apixaban (ELIQUIS) 2.5 MG TABS tablet Take 1 tablet (2.5 mg total) by mouth 2 (two) times daily. 180 tablet 4  . atenolol (TENORMIN) 25 MG tablet TAKE ONE TABLET BY MOUTH ONCE DAILY 90 tablet 0  . b complex vitamins tablet Take 1 tablet by mouth daily.      . Biotin 5000 MCG CAPS Take by mouth daily.      Marland Kitchen CALCIUM CARBONATE PO Take 1,200 mg by mouth daily.    . cephALEXin (KEFLEX) 500 MG capsule Take 1 capsule (500 mg total) by mouth 4 (four) times daily. 40 capsule 0  . CHONDROITIN SULFATE PO Take 1,200 mg by mouth daily.    . clotrimazole-betamethasone (LOTRISONE) cream Apply 1 application topically 2 (two) times daily. 30 g 0  . cyanocobalamin 2000 MCG tablet Take 2,000 mcg by mouth daily.    [name] Sodium (COLACE PO) Take by mouth as needed.    . doxycycline (VIBRAMYCIN) 100 MG capsule Take 1 capsule (100 mg total) by mouth 2 (two) times daily. 20 capsule 0  . folic acid (FOLVITE) 119 MCG tablet Take 400 mcg by mouth daily.    . furosemide (LASIX) 40 MG tablet Take 1 tablet (40 mg total) by mouth daily as needed. (Patient taking differently: Take 40 mg by mouth daily. ) 90 tablet 0  . Glucosamine HCl (GLUCOSAMINE PO) Take 1,500 mg by mouth daily.    . hydroxychloroquine (PLAQUENIL) 200 MG tablet Take 400 mg by mouth daily.     . Iron-Vitamin C (VITRON-C) 65-125 MG TABS Take 65 mg by mouth daily.      Marland Kitchen levothyroxine (SYNTHROID, LEVOTHROID) 75 MCG tablet TAKE ONE TABLET BY MOUTH ONCE DAILY BEFORE BREAKFAST 90 tablet 0  . magnesium hydroxide (MILK OF MAGNESIA) 400 MG/5ML suspension Take by mouth daily as needed for mild constipation.    . methocarbamol (ROBAXIN) 500 MG tablet Take 500 mg by mouth as needed for muscle spasms.    . methotrexate (RHEUMATREX) 2.5 MG tablet Take 8 tablets on Wednesday.    . Milk Thistle 1000 MG CAPS Take by mouth daily.    . Multiple Vitamins-Minerals (ONE-A-DAY 50 PLUS PO) Take by mouth daily.      . NON FORMULARY Astringent solution as needed.    Marland Kitchen omeprazole (PRILOSEC) 20 MG capsule Take 20 mg by mouth 2 (two) times daily before a meal.    . oxyCODONE (ROXICODONE) 15 MG immediate release tablet Take 15 mg by mouth every 4 (four) hours as needed for pain.    . Polyethylene Glycol 3350 (MIRALAX PO) Take by mouth as needed.    . triamcinolone cream (KENALOG) 0.1 % Apply 1 application topically 2 (two) times daily. 454 g 1  . vitamin C (ASCORBIC ACID) 500 MG tablet Take 500 mg by mouth daily.    Marland Kitchen zinc  sulfate 220 MG capsule Take 220 mg by mouth daily.     No current facility-administered medications on file prior to visit.    Allergies  Allergen Reactions  . Dabigatran Etexilate Mesylate Shortness Of Breath  . Coumadin [Warfarin Sodium] Other (See Comments)    hematoma  . Penicillins   . Pradaxa [Dabigatran Etexilate Mesylate] Other (See Comments)    SLEEP  . Sulfonamide Derivatives   . Tape Other (See Comments)    blisters  . Tramadol Hcl     Itching     Past Medical History  Diagnosis Date  . Arthritis   . Hyperlipidemia   . Hypothyroidism   . Atrial fibrillation     a. s/p prior DCCV's, now persistent-->Xarelto (CHA2DS2VASc = 3).  . Diverticulosis     a. by colonoscopy, h/o colon adenoma ([facility])  . Gastritis and duodenitis     a. 01/2014 EGD: Moderate non-erosive gastritis and mild non-erosive duodenitis.    Past Surgical History   Procedure Laterality Date  . Rotator cuff repair    . Intraocular lens implant, secondary  1996/ 2010  . Knee surgery      lateral  . Tonsillectomy    . Colonoscopy  02/2011    mod diverticulosis ([facility])  . Total knee arthroplasty Bilateral     Family History  Problem Relation Age of Onset  . Heart disease Mother     History   Social History  . Marital Status: Married    Spouse Name: N/A  . Number of Children: N/A  . Years of Education: N/A   Occupational History  . retired    Social History Main Topics  . Smoking status: Former Smoker    Types: Cigarettes  . Smokeless tobacco: Never Used  . Alcohol Use: Yes  . Drug Use: No  . Sexual Activity: Not on file   Other Topics Concern  . Not on file   Social History Narrative   Regular exercise   Remains active with church and community activities      Has living will   Husband, then Barton Fanny (friend), is health care POA   Has DNR order---form done again 05/15/12   No tube feeds if cognitively unaware   Review of Systems  Stomach off but still able to eat Continues on NOAD--ongoing bruising on leg     Objective:   Physical Exam  Constitutional: She appears well-developed and well-nourished. No distress.  Skin:  Bruising along right calf No longer red or warm          Assessment & Plan:

## 2014-08-29 NOTE — Patient Instructions (Addendum)
Please finish out the rest of the cephalexin but stop the doxycycline. Please try the omeprazole just once a day. Please check your updated medicine list

## 2014-08-29 NOTE — Assessment & Plan Note (Signed)
This is finally better Will have her finish out the cephalexin but stop the doxy No follow up needed

## 2014-08-30 ENCOUNTER — Telehealth: Payer: Self-pay

## 2014-08-30 NOTE — Telephone Encounter (Signed)
Spoke w/ pt's husband.  Advised him that per Dr. Rockey Situ, pt's Eliquis will not interfere w/ her methotrexate and will not cause additional bruising.  Asked him to relay message to pt and have her call back w/ any questions or concerns.

## 2014-08-30 NOTE — Telephone Encounter (Signed)
This encounter was created in error - please disregard.

## 2014-08-30 NOTE — Telephone Encounter (Signed)
Spoke w/ pt.  Advised her of Dr. Donivan Scull recommendation.  She states that she would like to know if Dr. Rockey Situ had inquired if she can reduce her Eliquis dose from 2.5 mg BID to a smaller dose.  Advised her that Eliquis comes in 2.5 mg & 5 mg. She asks for samples when we have them, as $100 is too much to pay for them.

## 2014-09-02 ENCOUNTER — Ambulatory Visit: Payer: Medicare Other | Admitting: Internal Medicine

## 2014-09-03 ENCOUNTER — Encounter
Admit: 2014-09-03 | Disposition: A | Discharge status: Home / Self Care | Payer: Self-pay | Attending: Surgery | Admitting: Surgery

## 2014-09-06 ENCOUNTER — Other Ambulatory Visit: Payer: Self-pay | Admitting: Internal Medicine

## 2014-09-13 ENCOUNTER — Encounter: Payer: Self-pay | Admitting: Internal Medicine

## 2014-09-18 ENCOUNTER — Other Ambulatory Visit: Payer: Self-pay | Admitting: Internal Medicine

## 2014-09-23 ENCOUNTER — Other Ambulatory Visit: Payer: Self-pay | Admitting: Internal Medicine

## 2014-10-03 ENCOUNTER — Other Ambulatory Visit: Payer: Self-pay | Admitting: Internal Medicine

## 2014-10-03 DIAGNOSIS — E785 Hyperlipidemia, unspecified: Secondary | ICD-10-CM

## 2014-10-04 ENCOUNTER — Encounter: Payer: Medicare Other | Admitting: Internal Medicine

## 2014-10-04 ENCOUNTER — Telehealth: Payer: Self-pay | Admitting: Internal Medicine

## 2014-10-04 NOTE — Telephone Encounter (Signed)
I spoke to patient and she's going to [facility].

## 2014-10-04 NOTE — Telephone Encounter (Signed)
[place] Patient Name: [name] DOB: 05-29-1927 Initial Comment Caller states she removed tick Monday, site is swollen and red today Nurse Assessment Nurse: Mechele Dawley, RN, [name] Date/Time Eilene Ghazi Time): 10/04/2014 12:40:21 PM Confirm and document reason for call. If symptomatic, describe symptoms. ---SHE REMOVED A TICK ON MONDAY. THE AREA IS SWOLLEN KNOT AND RED. ALMOST IN HER GROIN AREA, WHERE HER PUBIC HAIR IS. THERE IS AN AREA WHERE HER GLANDS ARE SWOLLEN ABOVE IT. SHE STATES THAT IT WAS A VERY SMALL TICK. IT WAS EMBEDDED IN. IT WAS HARD TO GET OFF. THE HIND LEGS WERE STILL MOVING WHEN SHE REMOVED IT. NOT WARM TO TOUCH, NO SORE. IT IS INFLAMED. NO RING IN THE CENTER OF THE BITE. NO RED STREAKS RUNNING FROM IT. SHE TOOK A PAIR OF VERY SHARP PAIR OF TWEEZERS AND PULLED IT OUT. Has the patient traveled out of the country within the last 30 days? ---Not Applicable Does the patient require triage? ---Yes Related visit to physician within the last 2 weeks? ---No Does the PT have any chronic conditions? (i.e. diabetes, asthma, etc.) ---Yes List chronic conditions. ---RA Guidelines Guideline Title Affirmed Question Affirmed Notes Tick Bite [1] Red or very tender (to touch) area AND [2] started over 24 hours after the bite Final Disposition User See Physician within [place], Therapist, sports, [place]

## 2014-10-04 NOTE — Telephone Encounter (Signed)
okay

## 2014-10-04 NOTE — Telephone Encounter (Signed)
Please see if someone can see her this afternoon or perhaps Saturday clinic

## 2014-10-07 ENCOUNTER — Telehealth: Payer: Self-pay | Admitting: *Deleted

## 2014-10-07 ENCOUNTER — Other Ambulatory Visit: Payer: Medicare Other

## 2014-10-07 ENCOUNTER — Other Ambulatory Visit (INDEPENDENT_AMBULATORY_CARE_PROVIDER_SITE_OTHER): Payer: Medicare Other

## 2014-10-07 DIAGNOSIS — E785 Hyperlipidemia, unspecified: Secondary | ICD-10-CM | POA: Diagnosis not present

## 2014-10-07 LAB — CBC WITH DIFFERENTIAL/PLATELET
BASOS ABS: 0 10*3/uL (ref 0.0–0.1)
Basophils Relative: 0.4 % (ref 0.0–3.0)
Eosinophils Absolute: 0 10*3/uL (ref 0.0–0.7)
Eosinophils Relative: 0 % (ref 0.0–5.0)
HCT: 26.3 % — ABNORMAL LOW (ref 36.0–46.0)
Hemoglobin: 8.9 g/dL — ABNORMAL LOW (ref 12.0–15.0)
LYMPHS ABS: 0.8 10*3/uL (ref 0.7–4.0)
Lymphocytes Relative: 16.6 % (ref 12.0–46.0)
MCHC: 33.7 g/dL (ref 30.0–36.0)
MCV: 92.3 fl (ref 78.0–100.0)
MONOS PCT: 9.4 % (ref 3.0–12.0)
Monocytes Absolute: 0.4 10*3/uL (ref 0.1–1.0)
NEUTROS ABS: 3.5 10*3/uL (ref 1.4–7.7)
Neutrophils Relative %: 73.6 % (ref 43.0–77.0)
Platelets: 314 10*3/uL (ref 150.0–400.0)
RBC: 2.85 Mil/uL — ABNORMAL LOW (ref 3.87–5.11)
RDW: 16.5 % — AB (ref 11.5–15.5)
WBC: 4.8 10*3/uL (ref 4.0–10.5)

## 2014-10-07 LAB — COMPREHENSIVE METABOLIC PANEL
ALK PHOS: 77 U/L (ref 39–117)
ALT: 18 U/L (ref 0–35)
AST: 25 U/L (ref 0–37)
Albumin: 4.1 g/dL (ref 3.5–5.2)
BUN: 26 mg/dL — ABNORMAL HIGH (ref 6–23)
CO2: 28 mEq/L (ref 19–32)
Calcium: 9.9 mg/dL (ref 8.4–10.5)
Chloride: 109 mEq/L (ref 96–112)
Creatinine, Ser: 1.45 mg/dL — ABNORMAL HIGH (ref 0.40–1.20)
GFR: 36.4 mL/min — ABNORMAL LOW (ref >60.00)
Glucose, Bld: 71 mg/dL (ref 70–99)
Potassium: 4.3 mEq/L (ref 3.5–5.1)
Sodium: 140 mEq/L (ref 135–145)
Total Bilirubin: 0.5 mg/dL (ref 0.2–1.2)
Total Protein: 6.6 g/dL (ref 6.0–8.3)

## 2014-10-07 LAB — LIPID PANEL
CHOL/HDL RATIO: 2
Cholesterol: 166 mg/dL (ref 0–200)
HDL: 73.3 mg/dL (ref >39.00)
LDL Cholesterol: 82 mg/dL (ref 0–99)
NONHDL: 92.7
Triglycerides: 54 mg/dL (ref 0.0–149.0)
VLDL: 10.8 mg/dL (ref 0.0–40.0)

## 2014-10-07 LAB — T4, FREE: Free T4: 1.3 ng/dL (ref 0.60–1.60)

## 2014-10-07 NOTE — Telephone Encounter (Signed)
Pt calling needing samples of eliquis  Please call if we have any.  Pt is almost out

## 2014-10-07 NOTE — Telephone Encounter (Signed)
Placed samples at front desk for pick up. 

## 2014-10-09 ENCOUNTER — Ambulatory Visit (INDEPENDENT_AMBULATORY_CARE_PROVIDER_SITE_OTHER): Payer: Medicare Other | Admitting: Internal Medicine

## 2014-10-09 ENCOUNTER — Encounter: Payer: Self-pay | Admitting: Internal Medicine

## 2014-10-09 VITALS — BP 110/60 | HR 92 | Temp 97.5°F | Ht 62.0 in | Wt 113.0 lb

## 2014-10-09 DIAGNOSIS — R042 Hemoptysis: Secondary | ICD-10-CM

## 2014-10-09 NOTE — Patient Instructions (Signed)
Follow up with Dr. Stevenson Clinch as needed.

## 2014-10-09 NOTE — Assessment & Plan Note (Signed)
DDx - medication induced, anticoagulation,weakend mucosal integrity from being on anticoagulation  Now resolved, no further episodes since Jan 2016.  Based on history and clinical symptoms patient had very mild hemoptysis, questioning if this is actually hemoptysis versus a GI source. Blood admixed sputum is very mild, not daily, and mainly in the mornings. She is not having more and 250 mL of blood in her cough per day, nor is she anywhere close to that number. Patient does have a history of a GI bleed while being on anticoagulation.  Most recent CT scan of chest with no suspicious pulmonary nodule, masses, tumor. There is moderate cardiomegaly noted. She does have an increase A-P diameter suggestive of COPD.  However, patient does not have any symptoms (shortness of breath, chronic cough, dyspnea on exertion, bronchitis episodes).  Plan -Continue with current anticoagulation for atrial fibrillation -No strong indication for bronchoscopy at this time, specially since hemoptysis has resolved since January 2016. -No further workup needed at this time. -Follow-up as needed

## 2014-10-09 NOTE — Progress Notes (Signed)
MRN# [id number] [name] January 18, 1927   CC: Chief Complaint  Patient presents with  . Follow-up     Pt states she is doing well. She has not seen anymore blood in sputum. She had her CT scan.      Brief History: HPI 08/14/2014 Patient is a pleasant 81 year old female past medical history chronic atrial fibrillation, rheumatoid arthritis, hypothyroidism, diverticulosis, History of GI bleed (was on Celebrex and Xarelto), presents today for further evaluation of intermittent episodes of hemoptysis. Patient states that she's had "mild blood in sputum" since being on a Eliquis for atrial fibrillation. Patient in March 2014 she was noted to have a GI bleed, had endoscopy done that showed nonbleeding, it was attributed to medication (Xareltoand Celebrex). She stated that time she was switched over to Eliquis for her atrial fibrillation since then she has noted mild intermittent hemoptysis. She states it is not common, is not severe or frank hemoptysis, there is no pattern to it, when it does occur it is usually in the mornings and a very small amount, she states that maybe streaks or flecks of blood mixed in with clear-white sputum. When hemoptysis does occur it is usually only one time per day for about 2-3 days and then resolve spontaneously. She denies bleeding from the gums, nose bleeds, blood in urine, and blood in stool. She was seen and evaluated by ENT, initially hemoptysis was thought to be due to upper airway bleeding, however, further evaluation by ENT showed no significant abnormalities or source of bleed. Her most recent episode of hemoptysis was 3 weeks ago at which point her primary care physician referred him to pulmonary for further evaluation. Currently she denies any fever, chills, night sweats, rapid weight loss, severe hemoptysis, or hematemesis. Plan - CT of Chest   Events since last clinic visit: Patient presents today for follow-up visit of hemoptysis secondary to possible  medication. Briefly patient was previously onXarelto and developed a GI bleed and subsequent hemoptysis, initially thought to be due to to have pulmonary issue and patient was referred for further pulmonary evaluation. Since her episode of hemoptysis in January 2016 she's had no further episodes, patient is still currently on Eliquis for atrial fibrillation. Since her last visit she's had a repeat CAT scan of her chest like to discuss the results today.      PMHX:   Past Medical History  Diagnosis Date  . Arthritis   . Hyperlipidemia   . Hypothyroidism   . Atrial fibrillation     a. s/p prior DCCV's, now persistent-->Xarelto (CHA2DS2VASc = 3).  . Diverticulosis     a. by colonoscopy, h/o colon adenoma ([facility])  . Gastritis and duodenitis     a. 01/2014 EGD: Moderate non-erosive gastritis and mild non-erosive duodenitis.   Surgical Hx:  Past Surgical History  Procedure Laterality Date  . Rotator cuff repair    . Intraocular lens implant, secondary  1996/ 2010  . Knee surgery      lateral  . Tonsillectomy    . Colonoscopy  02/2011    mod diverticulosis ([facility])  . Total knee arthroplasty Bilateral    Family Hx:  Family History  Problem Relation Age of Onset  . Heart disease Mother    Social Hx:   History  Substance Use Topics  . Smoking status: Former Smoker    Types: Cigarettes  . Smokeless tobacco: Never Used  . Alcohol Use: Yes   Medication:   Current Outpatient Rx  Name  Route  Sig  Dispense  Refill  . acetaminophen (TYLENOL) 650 MG CR tablet   Oral   Take 650 mg by mouth every 8 (eight) hours as needed for pain.         Marland Kitchen apixaban (ELIQUIS) 2.5 MG TABS tablet   Oral   Take 1 tablet (2.5 mg total) by mouth 2 (two) times daily.   180 tablet   4   . atenolol (TENORMIN) 25 MG tablet      TAKE ONE TABLET BY MOUTH ONCE DAILY   90 tablet   3   . Biotin 5000 MCG CAPS   Oral   Take by mouth daily.           Marland Kitchen CALCIUM CARBONATE PO   Oral   Take  1,200 mg by mouth daily.         . CHONDROITIN SULFATE PO   Oral   Take 1,200 mg by mouth daily.         . clotrimazole-betamethasone (LOTRISONE) cream   Topical   Apply 1 application topically 2 (two) times daily.   30 g   0   . cyanocobalamin 2000 MCG tablet   Oral   Take 2,000 mcg by mouth daily.         [name] Sodium (COLACE PO)   Oral   Take by mouth as needed.         . folic acid (FOLVITE) 177 MCG tablet   Oral   Take 400 mcg by mouth daily.         . furosemide (LASIX) 40 MG tablet      TAKE ONE TABLET BY MOUTH ONCE DAILY AS NEEDED   90 tablet   3   . Glucosamine HCl (GLUCOSAMINE PO)   Oral   Take 1,500 mg by mouth daily.         . hydroxychloroquine (PLAQUENIL) 200 MG tablet   Oral   Take 400 mg by mouth daily.          Marland Kitchen levothyroxine (SYNTHROID, LEVOTHROID) 75 MCG tablet      TAKE ONE TABLET BY MOUTH ONCE DAILY BEFORE BREAKFAST   90 tablet   3   . magnesium hydroxide (MILK OF MAGNESIA) 400 MG/5ML suspension   Oral   Take by mouth daily as needed for mild constipation.         . methocarbamol (ROBAXIN) 500 MG tablet   Oral   Take 500 mg by mouth as needed for muscle spasms.         . methotrexate (RHEUMATREX) 2.5 MG tablet      Take 8 tablets on Wednesday.         . Multiple Vitamins-Minerals (ONE-A-DAY 50 PLUS PO)   Oral   Take by mouth daily.           . NON FORMULARY      Astringent solution as needed.         Marland Kitchen omeprazole (PRILOSEC) 20 MG capsule   Oral   Take 20 mg by mouth daily.          Marland Kitchen oxyCODONE (ROXICODONE) 15 MG immediate release tablet   Oral   Take 15 mg by mouth every 4 (four) hours as needed for pain.         . Polyethylene Glycol 3350 (MIRALAX PO)   Oral   Take by mouth as needed.         . triamcinolone cream (KENALOG) 0.1 %  Topical   Apply 1 application topically 2 (two) times daily.   454 g   1   . doxycycline (VIBRAMYCIN) 100 MG capsule   Oral   Take 100 mg by mouth  2 (two) times daily.         . mupirocin ointment (BACTROBAN) 2 %   Topical   Apply 1 application topically 3 (three) times daily.            Review of Systems: Gen:  Denies  fever, sweats, chills HEENT: Denies blurred vision, double vision, ear pain, eye pain, hearing loss, nose bleeds, sore throat Cvc:  No dizziness, chest pain or heaviness Resp:   Denies cough or sputum porduction, shortness of breath Gi: Denies swallowing difficulty, stomach pain, nausea or vomiting, diarrhea, constipation, bowel incontinence Gu:  Denies bladder incontinence, burning urine Ext:   No Joint pain, stiffness or swelling Skin: No skin rash, easy bruising or bleeding or hives Endoc:  No polyuria, polydipsia , polyphagia or weight change Psych: No depression, insomnia or hallucinations  Other:  All other systems negative  Allergies:  Dabigatran etexilate mesylate; Coumadin; Penicillins; Pradaxa; Sulfonamide derivatives; Tape; and Tramadol hcl  Physical Examination:  VS: BP 110/60 mmHg  Pulse 92  Temp(Src) 97.5 F (36.4 C) (Oral)  Ht 5\' 2"  (1.575 m)  Wt 113 lb (51.256 kg)  BMI 20.66 kg/m2  SpO2 96%  General Appearance: No distress  HEENT: PERRLA, EOM intact, no ptosis, no other lesions noticed Pulmonary:Exam: normal breath sounds., diaphragmatic excursion normal.No wheezing, No rales   Cardiovascular:@ Exam:  Normal S1,S2.  No m/r/g.     Abdomen:Exam: Benign, Soft, non-tender, No masses  Skin:   warm, no rashes, no ecchymosis  Extremities: normal, no cyanosis, clubbing, no edema, warm with normal capillary refill.   Labs results:  BMP Lab Results  Component Value Date   NA 140 10/07/2014   K 4.3 10/07/2014   CL 109 10/07/2014   CO2 28 10/07/2014   GLUCOSE 71 10/07/2014   BUN 26* 10/07/2014   CREATININE 1.45* 10/07/2014     CBC CBC Latest Ref Rng 10/07/2014 03/07/2014 12/19/2013  WBC 4.0 - 10.5 K/uL 4.8 5.8 -  Hemoglobin 12.0 - 15.0 g/dL 8.9 Repeated and verified X2.(L) 10.6(L)  9.7(L)  Hematocrit 36.0 - 46.0 % 26.3 Repeated and verified X2.(L) 33.1(L) 29.3(L)  Platelets 150.0 - 400.0 K/uL 314.0 349 -     Rad results: (The following images and results were reviewed by Dr. Stevenson Clinch). CT Chest 08/22/2014 CLINICAL DATA: Hemoptysis intermittently  EXAM: CT CHEST WITH CONTRAST  TECHNIQUE: Multidetector CT imaging of the chest was performed during intravenous contrast administration.  CONTRAST: 50 cc Omnipaque 300  COMPARISON: None.  FINDINGS: On lung window images, no parenchymal infiltrate is seen. There is no pleural thickening noted. No suspicious lung nodule is seen. The lungs do appear to be hyperaerated however with some increased AP diameter which may indicate a degree of obstructive pulmonary disease. The central airway is patent minimal nodularity of the posterior pleura is noted at the lung bases left-greater-than-right. Thoracolumbar scoliosis is noted with degenerative change.  On soft tissue window images, the thyroid gland is unremarkable. The thoracic aorta opacifies with no significant abnormality and the origins of the great vessels are patent. The pulmonary arteries are not as well opacified but no acute abnormality is evident. No mediastinal or hilar adenopathy is seen. There is moderate cardiomegaly present, and the does appear to be a small pericardial cyst along the right cardiophrenic  angle.  IMPRESSION: 1. Probable chronic obstructive pulmonary disease. No active infiltrate or effusion. No suspicious pulmonary nodule. 2. Small pericardial cyst along the right cardiophrenic angle. 3. Moderate cardiomegaly.      Assessment and Plan: A 28-year-old female past medical history of atrial fibrillation previously onXarelto developed hemoptysis and switched over to Eliquis with no further episodes, seen in follow-up today. Hemoptysis DDx - medication induced, anticoagulation,weakend mucosal integrity from being on anticoagulation  Now  resolved, no further episodes since Jan 2016.  Based on history and clinical symptoms patient had very mild hemoptysis, questioning if this is actually hemoptysis versus a GI source. Blood admixed sputum is very mild, not daily, and mainly in the mornings. She is not having more and 250 mL of blood in her cough per day, nor is she anywhere close to that number. Patient does have a history of a GI bleed while being on anticoagulation.  Most recent CT scan of chest with no suspicious pulmonary nodule, masses, tumor. There is moderate cardiomegaly noted. She does have an increase A-P diameter suggestive of COPD.  However, patient does not have any symptoms (shortness of breath, chronic cough, dyspnea on exertion, bronchitis episodes).  Plan -Continue with current anticoagulation for atrial fibrillation -No strong indication for bronchoscopy at this time, specially since hemoptysis has resolved since January 2016. -No further workup needed at this time. -Follow-up as needed       Updated Medication List Outpatient Encounter Prescriptions as of 10/09/2014  Medication Sig  . acetaminophen (TYLENOL) 650 MG CR tablet Take 650 mg by mouth every 8 (eight) hours as needed for pain.  Marland Kitchen apixaban (ELIQUIS) 2.5 MG TABS tablet Take 1 tablet (2.5 mg total) by mouth 2 (two) times daily.  Marland Kitchen atenolol (TENORMIN) 25 MG tablet TAKE ONE TABLET BY MOUTH ONCE DAILY  . Biotin 5000 MCG CAPS Take by mouth daily.    Marland Kitchen CALCIUM CARBONATE PO Take 1,200 mg by mouth daily.  . CHONDROITIN SULFATE PO Take 1,200 mg by mouth daily.  . clotrimazole-betamethasone (LOTRISONE) cream Apply 1 application topically 2 (two) times daily.  . cyanocobalamin 2000 MCG tablet Take 2,000 mcg by mouth daily.  [name] Sodium (COLACE PO) Take by mouth as needed.  . folic acid (FOLVITE) 262 MCG tablet Take 400 mcg by mouth daily.  . furosemide (LASIX) 40 MG tablet TAKE ONE TABLET BY MOUTH ONCE DAILY AS NEEDED  . Glucosamine HCl  (GLUCOSAMINE PO) Take 1,500 mg by mouth daily.  . hydroxychloroquine (PLAQUENIL) 200 MG tablet Take 400 mg by mouth daily.   Marland Kitchen levothyroxine (SYNTHROID, LEVOTHROID) 75 MCG tablet TAKE ONE TABLET BY MOUTH ONCE DAILY BEFORE BREAKFAST  . magnesium hydroxide (MILK OF MAGNESIA) 400 MG/5ML suspension Take by mouth daily as needed for mild constipation.  . methocarbamol (ROBAXIN) 500 MG tablet Take 500 mg by mouth as needed for muscle spasms.  . methotrexate (RHEUMATREX) 2.5 MG tablet Take 8 tablets on Wednesday.  . Multiple Vitamins-Minerals (ONE-A-DAY 50 PLUS PO) Take by mouth daily.    . NON FORMULARY Astringent solution as needed.  Marland Kitchen omeprazole (PRILOSEC) 20 MG capsule Take 20 mg by mouth daily.   Marland Kitchen oxyCODONE (ROXICODONE) 15 MG immediate release tablet Take 15 mg by mouth every 4 (four) hours as needed for pain.  . Polyethylene Glycol 3350 (MIRALAX PO) Take by mouth as needed.  . triamcinolone cream (KENALOG) 0.1 % Apply 1 application topically 2 (two) times daily.  Marland Kitchen doxycycline (VIBRAMYCIN) 100 MG capsule Take 100 mg by  mouth 2 (two) times daily.  . mupirocin ointment (BACTROBAN) 2 % Apply 1 application topically 3 (three) times daily.    Orders for this visit: No orders of the defined types were placed in this encounter.    Thank  you for the visitation and for allowing  [facility] to assist in the care of your patient. Our recommendations are noted above.  Please contact us if we can be of further service.  Vilinda Boehringer, MD [facility] Office Number: [phone]

## 2014-10-11 ENCOUNTER — Other Ambulatory Visit: Payer: Self-pay | Admitting: *Deleted

## 2014-10-11 ENCOUNTER — Ambulatory Visit (INDEPENDENT_AMBULATORY_CARE_PROVIDER_SITE_OTHER): Payer: Medicare Other | Admitting: Internal Medicine

## 2014-10-11 ENCOUNTER — Encounter: Payer: Self-pay | Admitting: Internal Medicine

## 2014-10-11 VITALS — BP 118/74 | HR 56 | Temp 97.8°F | Ht 63.5 in | Wt 111.5 lb

## 2014-10-11 DIAGNOSIS — N183 Chronic kidney disease, stage 3 unspecified: Secondary | ICD-10-CM | POA: Insufficient documentation

## 2014-10-11 DIAGNOSIS — D509 Iron deficiency anemia, unspecified: Secondary | ICD-10-CM | POA: Diagnosis not present

## 2014-10-11 DIAGNOSIS — M069 Rheumatoid arthritis, unspecified: Secondary | ICD-10-CM

## 2014-10-11 DIAGNOSIS — I480 Paroxysmal atrial fibrillation: Secondary | ICD-10-CM

## 2014-10-11 DIAGNOSIS — Z0000 Encounter for general adult medical examination without abnormal findings: Secondary | ICD-10-CM | POA: Diagnosis not present

## 2014-10-11 DIAGNOSIS — E441 Mild protein-calorie malnutrition: Secondary | ICD-10-CM

## 2014-10-11 DIAGNOSIS — R042 Hemoptysis: Secondary | ICD-10-CM

## 2014-10-11 DIAGNOSIS — J439 Emphysema, unspecified: Secondary | ICD-10-CM

## 2014-10-11 NOTE — Assessment & Plan Note (Signed)
Told her not to restrict calories Add supplements

## 2014-10-11 NOTE — Assessment & Plan Note (Signed)
Restart the iron ??multifactorial with renal insuff now and MTX use Will recheck in 2 weeks to be sure she is not declining

## 2014-10-11 NOTE — Assessment & Plan Note (Signed)
Ongoing pain despite the MTX Will send note to Dr Raliegh Ip to review Rx in view of anemia and renal function

## 2014-10-11 NOTE — Assessment & Plan Note (Signed)
Regular now On the apixaban

## 2014-10-11 NOTE — Progress Notes (Signed)
Subjective:    Patient ID: [name], female    DOB: 1926/07/15, 81 y.o.   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, etc   Her anemia is worse Had EGD last year Not sure when her last colonoscopy was Results were sent to Dr Earlean Shawl No recurrence of hemoptysis Did stop the iron--she will restart it  Ongoing severe pain from RA Discussed that the MTX may be affecting her anemia as well Renal function has also declined Will send all results to Dr Jefm Bryant Has been using the furosemide regularly--discussed changing back to prn  Still with caregiving stress with husband Just starting with new caregiver 5 days per week--next week Hiring help for household help  No palpitations No chest pain Breathing is okay No dizziness or syncope No regular cough Reviewed her CT results ---probably low level   Energy levels down some No clear change in skin or hair Has split thumbnails---takes biotin  Current Outpatient Prescriptions on File Prior to Visit  Medication Sig Dispense Refill  . acetaminophen (TYLENOL) 650 MG CR tablet Take 650 mg by mouth every 8 (eight) hours as needed for pain.    Marland Kitchen apixaban (ELIQUIS) 2.5 MG TABS tablet Take 1 tablet (2.5 mg total) by mouth 2 (two) times daily. 180 tablet 4  . atenolol (TENORMIN) 25 MG tablet TAKE ONE TABLET BY MOUTH ONCE DAILY 90 tablet 3  . Biotin 5000 MCG CAPS Take by mouth daily.      Marland Kitchen CALCIUM CARBONATE PO Take 1,200 mg by mouth daily.    . CHONDROITIN SULFATE PO Take 1,200 mg by mouth daily.    . clotrimazole-betamethasone (LOTRISONE) cream Apply 1 application topically 2 (two) times daily. 30 g 0  . cyanocobalamin 2000 MCG tablet Take 2,000 mcg by mouth daily.    [name] Sodium (COLACE PO) Take by mouth as needed.    . folic acid  (FOLVITE) 782 MCG tablet Take 400 mcg by mouth daily.    . furosemide (LASIX) 40 MG tablet TAKE ONE TABLET BY MOUTH ONCE DAILY AS NEEDED 90 tablet 3  . Glucosamine HCl (GLUCOSAMINE PO) Take 1,500 mg by mouth daily.    . hydroxychloroquine (PLAQUENIL) 200 MG tablet Take 400 mg by mouth daily.     Marland Kitchen levothyroxine (SYNTHROID, LEVOTHROID) 75 MCG tablet TAKE ONE TABLET BY MOUTH ONCE DAILY BEFORE BREAKFAST 90 tablet 3  . magnesium hydroxide (MILK OF MAGNESIA) 400 MG/5ML suspension Take by mouth daily as needed for mild constipation.    . methocarbamol (ROBAXIN) 500 MG tablet Take 500 mg by mouth as needed for muscle spasms.    . methotrexate (RHEUMATREX) 2.5 MG tablet Take 8 tablets on Wednesday.    . Multiple Vitamins-Minerals (ONE-A-DAY 50 PLUS PO) Take by mouth daily.      . mupirocin ointment (BACTROBAN) 2 % Apply 1 application topically 3 (three) times daily.    . NON FORMULARY Astringent solution as needed.    Marland Kitchen omeprazole (PRILOSEC) 20 MG capsule Take 20 mg by mouth daily.     Marland Kitchen oxyCODONE (ROXICODONE) 15 MG immediate release tablet Take 15 mg by mouth every 4 (four) hours as needed for pain.    . Polyethylene Glycol 3350 (MIRALAX PO) Take by mouth as needed.    . triamcinolone cream (KENALOG) 0.1 % Apply 1 application topically 2 (  two) times daily. 454 g 1   No current facility-administered medications on file prior to visit.    Allergies  Allergen Reactions  . Dabigatran Etexilate Mesylate Shortness Of Breath  . Coumadin [Warfarin Sodium] Other (See Comments)    hematoma  . Doxycycline Hyclate [Doxycycline] Itching  . Penicillins   . Pradaxa [Dabigatran Etexilate Mesylate] Other (See Comments)    SLEEP  . Sulfonamide Derivatives   . Tape Other (See Comments)    blisters  . Tramadol Hcl     Itching     Past Medical History  Diagnosis Date  . Arthritis   . Hyperlipidemia   . Hypothyroidism   . Atrial fibrillation     a. s/p prior DCCV's, now persistent-->Xarelto (CHA2DS2VASc  = 3).  . Diverticulosis     a. by colonoscopy, h/o colon adenoma ([facility])  . Gastritis and duodenitis     a. 01/2014 EGD: Moderate non-erosive gastritis and mild non-erosive duodenitis.    Past Surgical History  Procedure Laterality Date  . Rotator cuff repair    . Intraocular lens implant, secondary  1996/ 2010  . Knee surgery      lateral  . Tonsillectomy    . Colonoscopy  02/2011    mod diverticulosis ([facility])  . Total knee arthroplasty Bilateral     Family History  Problem Relation Age of Onset  . Heart disease Mother     History   Social History  . Marital Status: Married    Spouse Name: N/A  . Number of Children: N/A  . Years of Education: N/A   Occupational History  . retired    Social History Main Topics  . Smoking status: Former Smoker    Types: Cigarettes  . Smokeless tobacco: Never Used  . Alcohol Use: Yes  . Drug Use: No  . Sexual Activity: Not on file   Other Topics Concern  . Not on file   Social History Narrative   Regular exercise   Remains active with church and community activities      Has living will   Husband, then Barton Fanny (friend), is health care POA   Has DNR order---form done again 05/15/12   No tube feeds if cognitively unaware   Review of Systems Leg infection is better. Itching with doxy--so she stopped Weight down 3# more Not eating great. Restricts calories-- like skim milk (discussed) Bowels are fine--gets bound up with pain meds (then uses miralax) Having lots of back pain    Objective:   Physical Exam  Constitutional: She is oriented to person, place, and time. She appears well-developed and well-nourished. No distress.  HENT:  Mouth/Throat: Oropharynx is clear and moist. No oropharyngeal exudate.  Neck: Normal range of motion. Neck supple. No thyromegaly present.  Cardiovascular: Regular rhythm.  Exam reveals no gallop.   Very soft systolic murmur ??very soft early diastolic murmur  Pulmonary/Chest:  Effort normal and breath sounds normal. No respiratory distress. She has no wheezes. She has no rales.  Abdominal: Soft. There is no tenderness.  Musculoskeletal: She exhibits no edema or tenderness.  Lymphadenopathy:    She has no cervical adenopathy.  Neurological: She is alert and oriented to person, place, and time.  President-- "Obama, Remer Macho, Bush" [phone] [id number] Recall 2/3  Skin: No rash noted.  Psychiatric: She has a normal mood and affect. Her behavior is normal.          Assessment & Plan:

## 2014-10-11 NOTE — Patient Instructions (Signed)
Please take the furosemide only if you have a lot of leg swelling. Please restart the iron. Please add nutritional supplements like boost, ensure or carnation instant breakfast with whole milk.  Please set up repeat blood work in about 2 weeks.

## 2014-10-11 NOTE — Progress Notes (Signed)
Pre visit review using our clinic review tool, if applicable. No additional management support is needed unless otherwise documented below in the visit note. 

## 2014-10-11 NOTE — Assessment & Plan Note (Signed)
Emphysematous changes on CT Poor exercise tolerance No need for meds at this point

## 2014-10-11 NOTE — Assessment & Plan Note (Signed)
I have personally reviewed the Medicare Annual Wellness questionnaire and have noted 1. The patient's medical and social history 2. Their use of alcohol, tobacco or illicit drugs 3. Their current medications and supplements 4. The patient's functional ability including ADL's, fall risks, home safety risks and hearing or visual             impairment. 5. Diet and physical activities 6. Evidence for depression or mood disorders  The patients weight, height, BMI and visual acuity have been recorded in the chart I have made referrals, counseling and provided education to the patient based review of the above and I have provided the pt with a written personalized care plan for preventive services.  I have provided you with a copy of your personalized plan for preventive services. Please take the time to review along with your updated medication list.  Pneumovax after 12/16 No cancer screening due to age Discussed fitness

## 2014-10-11 NOTE — Assessment & Plan Note (Signed)
Discussed changing the furosemide to prn Will recheck in 2 weeks

## 2014-10-24 ENCOUNTER — Encounter: Payer: Self-pay | Admitting: Internal Medicine

## 2014-10-24 ENCOUNTER — Other Ambulatory Visit (INDEPENDENT_AMBULATORY_CARE_PROVIDER_SITE_OTHER): Payer: Medicare Other

## 2014-10-24 DIAGNOSIS — D509 Iron deficiency anemia, unspecified: Secondary | ICD-10-CM

## 2014-10-24 DIAGNOSIS — N183 Chronic kidney disease, stage 3 unspecified: Secondary | ICD-10-CM

## 2014-10-24 LAB — CBC WITH DIFFERENTIAL/PLATELET
BASOS PCT: 0.4 % (ref 0.0–3.0)
Basophils Absolute: 0 10*3/uL (ref 0.0–0.1)
EOS PCT: 0 % (ref 0.0–5.0)
Eosinophils Absolute: 0 10*3/uL (ref 0.0–0.7)
HCT: 27.1 % — ABNORMAL LOW (ref 36.0–46.0)
LYMPHS PCT: 8.8 % — AB (ref 12.0–46.0)
Lymphs Abs: 0.6 10*3/uL — ABNORMAL LOW (ref 0.7–4.0)
MCHC: 32.7 g/dL (ref 30.0–36.0)
MCV: 92.7 fl (ref 78.0–100.0)
MONOS PCT: 9.4 % (ref 3.0–12.0)
Monocytes Absolute: 0.6 10*3/uL (ref 0.1–1.0)
NEUTROS PCT: 81.4 % — AB (ref 43.0–77.0)
Neutro Abs: 5.3 10*3/uL (ref 1.4–7.7)
Platelets: 327 10*3/uL (ref 150.0–400.0)
RBC: 2.93 Mil/uL — ABNORMAL LOW (ref 3.87–5.11)
RDW: 19.4 % — ABNORMAL HIGH (ref 11.5–15.5)
WBC: 6.5 10*3/uL (ref 4.0–10.5)

## 2014-10-24 LAB — RENAL FUNCTION PANEL
Albumin: 4.1 g/dL (ref 3.5–5.2)
BUN: 16 mg/dL (ref 6–23)
CALCIUM: 8.9 mg/dL (ref 8.4–10.5)
CHLORIDE: 101 meq/L (ref 96–112)
CO2: 23 mEq/L (ref 19–32)
Creatinine, Ser: 1.01 mg/dL (ref 0.40–1.20)
GFR: 55.24 mL/min — ABNORMAL LOW (ref >60.00)
Glucose, Bld: 101 mg/dL — ABNORMAL HIGH (ref 70–99)
POTASSIUM: 4 meq/L (ref 3.5–5.1)
Phosphorus: 2.8 mg/dL (ref 2.3–4.6)
Sodium: 133 mEq/L — ABNORMAL LOW (ref 135–145)

## 2014-11-14 ENCOUNTER — Telehealth: Payer: Self-pay | Admitting: *Deleted

## 2014-11-14 ENCOUNTER — Encounter: Payer: Self-pay | Admitting: Internal Medicine

## 2014-11-14 NOTE — Telephone Encounter (Signed)
Please let her know that the spine center thinks it is best for her to come back here for her pain issues. Please offer her an appt

## 2014-11-14 NOTE — Telephone Encounter (Signed)
Colleen PA from [facility] calling stating that pt keeps coming to them for back pain and they fill that pt no longer has the back pain, that it may be from the GI bleed. They have increased her norco and it's not responding as if it was back pain. They have reminded pt to call her PCP or GI to follow-up but pt hasn't. Pt states Dr. Silvio Pate and her GI dr know all about the GI bleed.

## 2014-11-15 NOTE — Telephone Encounter (Signed)
Spoke with patient and advised results, she will go back to the GI first per [employer]

## 2014-11-20 ENCOUNTER — Telehealth: Payer: Self-pay | Admitting: Internal Medicine

## 2014-11-20 NOTE — Telephone Encounter (Signed)
Pt seeing Dr.Medoff tomorrow. Pt received call from Dr. Duard Brady that her anemia was bad. Pt will schedule an appt with Dr. Silvio Pate Monday at 4pm to discuss results of the endoscopy.

## 2014-11-20 NOTE — Telephone Encounter (Signed)
Patient is asking for [facility] to call her.

## 2014-11-20 NOTE — Telephone Encounter (Signed)
okay

## 2014-11-25 ENCOUNTER — Ambulatory Visit (INDEPENDENT_AMBULATORY_CARE_PROVIDER_SITE_OTHER): Payer: Medicare Other | Admitting: Internal Medicine

## 2014-11-25 ENCOUNTER — Encounter: Payer: Self-pay | Admitting: Internal Medicine

## 2014-11-25 VITALS — BP 108/60 | HR 68 | Temp 97.8°F | Wt 121.0 lb

## 2014-11-25 DIAGNOSIS — Q2733 Arteriovenous malformation of digestive system vessel: Secondary | ICD-10-CM

## 2014-11-25 DIAGNOSIS — K31819 Angiodysplasia of stomach and duodenum without bleeding: Secondary | ICD-10-CM | POA: Insufficient documentation

## 2014-11-25 NOTE — Progress Notes (Signed)
Subjective:    Patient ID: [name], female    DOB: 02-02-1927, 81 y.o.   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 acetaminophen (TYLENOL) 650 MG CR tablet Take 650 mg by mouth every 8 (eight) hours as needed for pain.    Marland Kitchen apixaban (ELIQUIS) 2.5 MG TABS tablet Take 1 tablet (2.5 mg total) by mouth 2 (two) times daily. 180 tablet 4  . atenolol (TENORMIN) 25 MG tablet TAKE ONE TABLET BY MOUTH ONCE DAILY 90 tablet 3  . Biotin 5000 MCG CAPS Take by mouth daily.      Marland Kitchen CALCIUM CARBONATE PO Take 1,200 mg by mouth daily.    . CHONDROITIN SULFATE PO Take 1,200 mg by mouth daily.    . clotrimazole-betamethasone (LOTRISONE) cream Apply 1 application topically 2 (two) times daily. 30 g 0  . cyanocobalamin 2000 MCG tablet Take 2,000 mcg by mouth daily.    [name] Sodium (COLACE PO) Take by mouth as needed.    . furosemide (LASIX) 40 MG tablet TAKE ONE TABLET BY MOUTH ONCE DAILY AS NEEDED 90 tablet 3  . Glucosamine HCl (GLUCOSAMINE PO) Take 1,500 mg by mouth daily.    . hydroxychloroquine (PLAQUENIL) 200 MG tablet Take 400 mg by mouth daily.     Marland Kitchen levothyroxine (SYNTHROID, LEVOTHROID) 75 MCG tablet TAKE ONE TABLET BY MOUTH ONCE DAILY BEFORE BREAKFAST 90 tablet 3  . magnesium hydroxide (MILK OF MAGNESIA) 400 MG/5ML suspension Take by mouth daily as needed for mild constipation.    . methocarbamol (ROBAXIN) 500 MG tablet Take 500 mg by mouth as needed for muscle spasms.    . methotrexate (RHEUMATREX) 2.5 MG tablet Take 5 tablets on Wednesday.    . Multiple Vitamins-Minerals (ONE-A-DAY 50 PLUS PO)  Take by mouth daily.      . mupirocin ointment (BACTROBAN) 2 % Apply 1 application topically 3 (three) times daily.    Marland Kitchen omeprazole (PRILOSEC) 20 MG capsule Take 20 mg by mouth daily.     Marland Kitchen oxyCODONE (ROXICODONE) 15 MG immediate release tablet Take 15 mg by mouth every 4 (four) hours as needed for pain.    . Polyethylene Glycol 3350 (MIRALAX PO) Take by mouth as needed.     No current facility-administered medications on file prior to visit.    Allergies  Allergen Reactions  . Dabigatran Etexilate Mesylate Shortness Of Breath  . Coumadin [Warfarin Sodium] Other (See Comments)    hematoma  . Doxycycline Hyclate [Doxycycline] Itching  . Penicillins   . Pradaxa [Dabigatran Etexilate Mesylate] Other (See Comments)    SLEEP  . Sulfonamide Derivatives   . Tape Other (See Comments)    blisters  . Tramadol Hcl     Itching     Past Medical History  Diagnosis Date  . Arthritis   . Hyperlipidemia   . Hypothyroidism   . Atrial fibrillation     a. s/p prior DCCV's, now persistent-->Xarelto (CHA2DS2VASc = 3).  . Diverticulosis     a. by colonoscopy, h/o colon adenoma ([facility])  . Gastritis and duodenitis  a. 01/2014 EGD: Moderate non-erosive gastritis and mild non-erosive duodenitis.    Past Surgical History  Procedure Laterality Date  . Rotator cuff repair    . Intraocular lens implant, secondary  1996/ 2010  . Knee surgery      lateral  . Tonsillectomy    . Colonoscopy  02/2011    mod diverticulosis ([facility])  . Total knee arthroplasty Bilateral     Family History  Problem Relation Age of Onset  . Heart disease Mother     History   Social History  . Marital Status: Married    Spouse Name: N/A  . Number of Children: N/A  . Years of Education: N/A   Occupational History  . retired    Social History Main Topics  . Smoking status: Former Smoker    Types: Cigarettes  . Smokeless tobacco: Never Used  . Alcohol Use: Yes  . Drug Use: No  . Sexual Activity: Not on  file   Other Topics Concern  . Not on file   Social History Narrative   Regular exercise   Remains active with church and community activities      Has living will   Husband, then Barton Fanny (friend), is health care POA   Has DNR order---form done again 05/15/12   No tube feeds if cognitively unaware   Review of Systems  No abdominal pain Bowels moving with miralax Still not much appetite     Objective:   Physical Exam  Constitutional: She appears well-developed and well-nourished. No distress.  Abdominal: Soft. Bowel sounds are normal. She exhibits no distension. There is no tenderness. There is no rebound and no guarding.          Assessment & Plan:

## 2014-11-25 NOTE — Patient Instructions (Addendum)
Please restart the iron tablet on Thursday May 26th.

## 2014-11-25 NOTE — Progress Notes (Signed)
Pre visit review using our clinic review tool, if applicable. No additional management support is needed unless otherwise documented below in the visit note. 

## 2014-11-25 NOTE — Assessment & Plan Note (Signed)
And duodenal Was cauterized but will need to assume that there are more or that these may rebleed Should stay on the iron tabs (will be restarting soon) Will recheck CBC in about 2 weeks

## 2014-12-03 ENCOUNTER — Telehealth: Payer: Self-pay | Admitting: Internal Medicine

## 2014-12-03 ENCOUNTER — Encounter: Payer: Medicare Other | Attending: Surgery | Admitting: Surgery

## 2014-12-03 DIAGNOSIS — S0181XA Laceration without foreign body of other part of head, initial encounter: Secondary | ICD-10-CM | POA: Diagnosis not present

## 2014-12-03 DIAGNOSIS — I482 Chronic atrial fibrillation: Secondary | ICD-10-CM | POA: Diagnosis not present

## 2014-12-03 DIAGNOSIS — W19XXXA Unspecified fall, initial encounter: Secondary | ICD-10-CM | POA: Insufficient documentation

## 2014-12-03 DIAGNOSIS — Z87891 Personal history of nicotine dependence: Secondary | ICD-10-CM | POA: Insufficient documentation

## 2014-12-03 DIAGNOSIS — R600 Localized edema: Secondary | ICD-10-CM | POA: Insufficient documentation

## 2014-12-03 DIAGNOSIS — S0101XA Laceration without foreign body of scalp, initial encounter: Secondary | ICD-10-CM | POA: Diagnosis present

## 2014-12-03 DIAGNOSIS — M069 Rheumatoid arthritis, unspecified: Secondary | ICD-10-CM | POA: Insufficient documentation

## 2014-12-03 NOTE — Telephone Encounter (Signed)
Pt saw dr Con Memos this morning for wound care.  She stated he wanted her to see Dr Silvio Pate ASAP.  Her appointment is 12/04/14.  I offered her appointment with kate or regina today 5/31 pt wanted to wait on dr Silvio Pate Pt stated she fell on Sunday and hit her head

## 2014-12-03 NOTE — Progress Notes (Signed)
[name] ([id number]) Visit Report for 12/03/2014 Abuse/Suicide Risk Screen Details Patient Name: [name], [name]. Date of Service: 12/03/2014 11:00 AM Medical Record Number: [id number] Patient Account Number: [id number] Date of Birth/Sex: 1926-12-31 (81 y.o. Female) Treating RN: Junious Dresser Primary Care Physician: Viviana Simpler Other Clinician: Referring Physician: Viviana Simpler Treating Physician/Extender: Frann Rider in Treatment: 17 Abuse/Suicide Risk Screen Items Answer ABUSE/SUICIDE RISK SCREEN: Has anyone close to you tried to hurt or harm you recentlyo No Do you feel uncomfortable with anyone in your familyo No Has anyone forced you do things that you didnot want to doo No Do you have any thoughts of harming yourselfo No Patient displays signs or symptoms of abuse and/or neglect. No Electronic Signature(s) Signed: 12/03/2014 2:10:43 PM By: Junious Dresser RN Entered By: Junious Dresser on 12/03/2014 11:45:48 Lefevers, Leward Quan ([id number]) -------------------------------------------------------------------------------- Activities of Daily Living Details Patient Name: [name], [name] Date of Service: 12/03/2014 11:00 AM Medical Record Number: [id number] Patient Account Number: [id number] Date of Birth/Sex: 1927-05-27 (81 y.o. Female) Treating RN: Junious Dresser Primary Care Physician: Viviana Simpler Other Clinician: Referring Physician: Viviana Simpler Treating Physician/Extender: Frann Rider in Treatment: 17 Activities of Daily Living Items Answer Activities of Daily Living (Please select one for each item) Drive Automobile Completely Able Take Medications Completely Able Use Telephone Completely Able Care for Appearance Completely Able Use Toilet Completely Able Bath / Shower Completely Able Dress Self Completely Able Feed Self Completely Able Walk Completely Able Get In / Out Bed Completely Able Housework Completely Able Prepare Meals  Completely Able Handle Money Completely Able Shop for Self Completely Able Electronic Signature(s) Signed: 12/03/2014 2:10:43 PM By: Junious Dresser RN Entered By: Junious Dresser on 12/03/2014 11:45:57 Kuhrt, Leward Quan ([id number]) -------------------------------------------------------------------------------- Education Assessment Details Patient Name: [name] Date of Service: 12/03/2014 11:00 AM Medical Record Number: [id number] Patient Account Number: [id number] Date of Birth/Sex: 22-Oct-1926 (81 y.o. Female) Treating RN: Junious Dresser Primary Care Physician: Viviana Simpler Other Clinician: Referring Physician: Viviana Simpler Treating Physician/Extender: Frann Rider in Treatment: 18 Primary Learner Assessed: Patient Learning Preferences/Education Level/Primary Language Learning Preference: Explanation, Demonstration, Printed Material Highest Education Level: College or Above Preferred Language: English Cognitive Barrier Assessment/Beliefs Language Barrier: No Translator Needed: No Memory Deficit: No Emotional Barrier: No Cultural/Religious Beliefs Affecting Medical No Care: Physical Barrier Assessment Impaired Vision: No Impaired Hearing: No Decreased Hand dexterity: Yes Limitations: RA Knowledge/Comprehension Assessment Knowledge Level: High Comprehension Level: High Ability to understand written High instructions: Ability to understand verbal High instructions: Motivation Assessment Anxiety Level: Calm Cooperation: Cooperative Education Importance: Acknowledges Need Interest in Health Problems: Asks Questions Perception: Coherent Willingness to Engage in Self- High Management Activities: Readiness to Engage in Self- High Management Activities: Electronic Signature(s) [name] ([id number]) Signed: 12/03/2014 2:10:43 PM By: Junious Dresser RN Entered By: Junious Dresser on 12/03/2014 11:46:31 Meadors, Leward Quan  ([id number]) -------------------------------------------------------------------------------- Fall Risk Assessment Details Patient Name: [name]. Date of Service: 12/03/2014 11:00 AM Medical Record Number: [id number] Patient Account Number: [id number] Date of Birth/Sex: 04-12-1927 (81 y.o. Female) Treating RN: Junious Dresser Primary Care Physician: Viviana Simpler Other Clinician: Referring Physician: Viviana Simpler Treating Physician/Extender: Frann Rider in Treatment: 17 Fall Risk Assessment Items FALL RISK ASSESSMENT: History of falling - immediate or within 3 months 25 Yes Secondary diagnosis 0 No Ambulatory aid None/bed rest/wheelchair/nurse 0 Yes Crutches/cane/walker 0 No Furniture 0 No IV Access/Saline Lock 0 No Gait/Training Normal/bed rest/immobile 0 Yes Weak 0 No Impaired 0 No Mental Status Oriented to own  ability 0 Yes Electronic Signature(s) Signed: 12/03/2014 2:10:43 PM By: Junious Dresser RN Entered By: Junious Dresser on 12/03/2014 11:46:45 Dygert, Leward Quan ([id number]) -------------------------------------------------------------------------------- Nutrition Risk Assessment Details Patient Name: [name] Date of Service: 12/03/2014 11:00 AM Medical Record Number: [id number] Patient Account Number: [id number] Date of Birth/Sex: [birth year] (81 y.o. Female) Treating RN: Junious Dresser Primary Care Physician: Viviana Simpler Other Clinician: Referring Physician: Viviana Simpler Treating Physician/Extender: Frann Rider in Treatment: 17 Height (in): 62 Weight (lbs): 113.7 Body Mass Index (BMI): 20.8 Nutrition Risk Assessment Items NUTRITION RISK SCREEN: I have an illness or condition that made me change the kind and/or 2 Yes amount of food I eat I eat fewer than two meals per day 0 No I eat few fruits and vegetables, or milk products 0 No I have three or more drinks of beer, liquor or wine almost every day 0 No I have tooth or mouth  problems that make it hard for me to eat 0 No I don't always have enough money to buy the food I need 0 No I eat alone most of the time 0 No I take three or more different prescribed or over-the-counter drugs a 1 Yes day Without wanting to, I have lost or gained 10 pounds in the last six 2 Yes months I am not always physically able to shop, cook and/or feed myself 0 No Nutrition Protocols Good Risk Protocol Provide education on Moderate Risk Protocol 0 nutrition Electronic Signature(s) Signed: 12/03/2014 2:10:43 PM By: Junious Dresser RN Entered By: Junious Dresser on 12/03/2014 11:47:21

## 2014-12-03 NOTE — Telephone Encounter (Signed)
Patient scheduled appt for tomorrow.

## 2014-12-03 NOTE — Progress Notes (Signed)
[name], [name] ([id number]) Visit Report for 12/03/2014 Allergy List Details Patient Name: Jacqueline, [name]. Date of Service: 12/03/2014 11:00 AM Medical Record Number: [id number] Patient Account Number: [id number] Date of Birth/Sex: 12/07/1926 (81 y.o. Female) Treating RN: Junious Dresser Primary Care Physician: Viviana Simpler Other Clinician: Referring Physician: Viviana Simpler Treating Physician/Extender: Frann Rider in Treatment: 17 Allergies Active Allergies Penicillins Reaction: anaphylaxis Severity: Severe sulfa drugs Reaction: anaphylaxis Severity: Severe adhesive Reaction: blistering Severity: Severe Pradaxa Reaction: made her crazy Severity: Severe Ativan Reaction: disoriented Coumadin Allergy Notes Electronic Signature(s) Signed: 12/03/2014 2:10:43 PM By: Junious Dresser RN Entered By: Junious Dresser on 12/03/2014 11:36:44 Rosamilia, Leward Quan ([id number]) -------------------------------------------------------------------------------- [place] Details Patient Name: [name] Date of Service: 12/03/2014 11:00 AM Medical Record Number: [id number] Patient Account Number: [id number] Date of Birth/Sex: Sep 23, 1926 (81 y.o. Female) Treating RN: Junious Dresser Primary Care Physician: Viviana Simpler Other Clinician: Referring Physician: Viviana Simpler Treating Physician/Extender: Frann Rider in Treatment: 30 Visit Information Patient Arrived: Ambulatory Arrival Time: 11:30 Accompanied By: self Transfer Assistance: None Patient Identification Verified: Yes Secondary Verification Process Yes Completed: Patient Has Alerts: Yes Patient Alerts: Patient on Blood Thinner History Since Last Visit Added or deleted any medications: Yes Any new allergies or adverse reactions: No Had a fall or experienced change in activities of daily living that may affect risk of falls: Yes Signs or symptoms of abuse/neglect since last visito  Yes Hospitalized since last visit: No Electronic Signature(s) Signed: 12/03/2014 2:10:43 PM By: Junious Dresser RN Entered By: Junious Dresser on 12/03/2014 11:32:57 Burkemper, Leward Quan ([id number]) -------------------------------------------------------------------------------- Clinic Level of Care Assessment Details Patient Name: [name] Date of Service: 12/03/2014 11:00 AM Medical Record Number: [id number] Patient Account Number: [id number] Date of Birth/Sex: 1926-10-21 (81 y.o. Female) Treating RN: Junious Dresser Primary Care Physician: Viviana Simpler Other Clinician: Referring Physician: Viviana Simpler Treating Physician/Extender: Frann Rider in Treatment: 17 Clinic Level of Care Assessment Items TOOL 2 Quantity Score []  - Use when only an EandM is performed on the INITIAL visit 0 ASSESSMENTS - Nursing Assessment / Reassessment []  - General Physical Exam (combine w/ comprehensive assessment (listed just 0 below) when performed on new pt. evals) X - Comprehensive Assessment (HX, ROS, Risk Assessments, Wounds Hx, etc.) 1 25 ASSESSMENTS - Wound and Skin Assessment / Reassessment X - Simple Wound Assessment / Reassessment - one wound 1 5 []  - Complex Wound Assessment / Reassessment - multiple wounds 0 []  - Dermatologic / Skin Assessment (not related to wound area) 0 ASSESSMENTS - Ostomy and/or Continence Assessment and Care []  - Incontinence Assessment and Management 0 []  - Ostomy Care Assessment and Management (repouching, etc.) 0 PROCESS - Coordination of Care X - Simple Patient / Family Education for ongoing care 1 15 []  - Complex (extensive) Patient / Family Education for ongoing care 0 X - Staff obtains Programmer, systems, Records, Test Results / Process Orders 1 10 []  - Staff telephones HHA, Nursing Homes / Clarify orders / etc 0 []  - Routine Transfer to another Facility (non-emergent condition) 0 []  - Routine Hospital Admission (non-emergent condition) 0 X - New  Admissions / Biomedical engineer / Ordering NPWT, Apligraf, etc. 1 15 []  - Emergency Hospital Admission (emergent condition) 0 X - Simple Discharge Coordination 1 10 Barcenas, Telma S. ([id number]) []  - Complex (extensive) Discharge Coordination 0 PROCESS - Special Needs []  - Pediatric / Minor Patient Management 0 []  - Isolation Patient Management 0 []  - Hearing / Language / Visual special needs 0 []  -  Assessment of Community assistance (transportation, D/C planning, etc.) 0 []  - Additional assistance / Altered mentation 0 []  - Support Surface(s) Assessment (bed, cushion, seat, etc.) 0 INTERVENTIONS - Wound Cleansing / Measurement X - Wound Imaging (photographs - any number of wounds) 1 5 []  - Wound Tracing (instead of photographs) 0 X - Simple Wound Measurement - one wound 1 5 []  - Complex Wound Measurement - multiple wounds 0 []  - Simple Wound Cleansing - one wound 0 []  - Complex Wound Cleansing - multiple wounds 0 INTERVENTIONS - Wound Dressings X - Small Wound Dressing one or multiple wounds 1 10 []  - Medium Wound Dressing one or multiple wounds 0 []  - Large Wound Dressing one or multiple wounds 0 []  - Application of Medications - injection 0 INTERVENTIONS - Miscellaneous []  - External ear exam 0 []  - Specimen Collection (cultures, biopsies, blood, body fluids, etc.) 0 []  - Specimen(s) / Culture(s) sent or taken to Lab for analysis 0 []  - Patient Transfer (multiple staff / Harrel Lemon Lift / Similar devices) 0 []  - Simple Staple / Suture removal (25 or less) 0 []  - Complex Staple / Suture removal (26 or more) 0 Meline, Cobie S. ([id number]) []  - Hypo / Hyperglycemic Management (close monitor of Blood Glucose) 0 []  - Ankle / Brachial Index (ABI) - do not check if billed separately 0 Has the patient been seen at the hospital within the last three years: Yes Total Score: 100 Level Of Care: New/Established - Level 3 Electronic Signature(s) Signed: 12/03/2014 2:10:43 PM By: Junious Dresser RN Entered By: Junious Dresser on 12/03/2014 12:05:54 [name] ([id number]) -------------------------------------------------------------------------------- Encounter Discharge Information Details Patient Name: [name]. Date of Service: 12/03/2014 11:00 AM Medical Record Number: [id number] Patient Account Number: [id number] Date of Birth/Sex: 1926-10-19 (81 y.o. Female) Treating RN: Primary Care Physician: Viviana Simpler Other Clinician: Referring Physician: Viviana Simpler Treating Physician/Extender: Frann Rider in Treatment: 40 Encounter Discharge Information Items Discharge Pain Level: 0 Discharge Condition: Stable Ambulatory Status: Ambulatory Discharge Destination: Home Transportation: Private Auto Accompanied By: self Schedule Follow-up Appointment: Yes Medication Reconciliation completed and provided to Patient/Care No Bretton Tandy: Provided on Clinical Summary of Care: 12/03/2014 Form Type Recipient Paper Patient JD Electronic Signature(s) Signed: 12/03/2014 12:20:01 PM By: Montey Hora Previous Signature: 12/03/2014 12:18:40 PM Version By: Ruthine Dose Entered By: Montey Hora on 12/03/2014 12:20:01 [name] ([id number]) -------------------------------------------------------------------------------- Multi Wound Chart Details Patient Name: [name]. Date of Service: 12/03/2014 11:00 AM Medical Record Number: [id number] Patient Account Number: [id number] Date of Birth/Sex: [birth year] (81 y.o. Female) Treating RN: Junious Dresser Primary Care Physician: Viviana Simpler Other Clinician: Referring Physician: Viviana Simpler Treating Physician/Extender: Frann Rider in Treatment: 17 Vital Signs Height(in): 62 Pulse(bpm): 63 Weight(lbs): 113.7 Blood Pressure 138/70 (mmHg): Body Mass Index(BMI): 21 Temperature(F): 97.7 Respiratory Rate 16 (breaths/min): Photos: [5:No Photos] [N/A:N/A] Wound Location: [5:Right  Head - frontal] [N/A:N/A] Wounding Event: [5:Trauma] [N/A:N/A] Primary Etiology: [5:Trauma, Other] [N/A:N/A] Comorbid History: [5:Cataracts, Anemia, Arrhythmia, Rheumatoid Arthritis, Osteoarthritis, Received Chemotherapy] [N/A:N/A] Date Acquired: [5:12/01/2014] [N/A:N/A] Weeks of Treatment: [5:0] [N/A:N/A] Wound Status: [5:Open] [N/A:N/A] Measurements L x W x D 2.9x5.9x0.1 [N/A:N/A] (cm) Area (cm) : [5:13.438] [N/A:N/A] Volume (cm) : [5:1.344] [N/A:N/A] % Reduction in Area: [5:0.00%] [N/A:N/A] % Reduction in Volume: 0.00% [N/A:N/A] Classification: [5:Partial Thickness] [N/A:N/A] Exudate Amount: [5:Medium] [N/A:N/A] Exudate Type: [5:Sanguinous] [N/A:N/A] Exudate Color: [5:red] [N/A:N/A] Wound Margin: [5:Flat and Intact] [N/A:N/A] Granulation Amount: [5:Medium (34-66%)] [N/A:N/A] Granulation Quality: [5:Red] [N/A:N/A] Necrotic Amount: [5:None Present (0%)] [N/A:N/A] Exposed Structures: [5:Fascia:  No Fat: No Tendon: No Muscle: No Joint: No] [N/A:N/A] Bone: No Limited to Skin Breakdown Epithelialization: Small (1-33%) N/A N/A Periwound Skin Texture: Edema: Yes N/A N/A Excoriation: No Induration: No Callus: No Crepitus: No Fluctuance: No Friable: No Rash: No Scarring: No Periwound Skin Maceration: No N/A N/A Moisture: Moist: No Dry/Scaly: No Periwound Skin Color: Atrophie Blanche: No N/A N/A Cyanosis: No Ecchymosis: No Erythema: No Hemosiderin Staining: No Mottled: No Pallor: No Rubor: No Temperature: No Abnormality N/A N/A Tenderness on Yes N/A N/A Palpation: Wound Preparation: Ulcer Cleansing: N/A N/A Rinsed/Irrigated with Saline Topical Anesthetic Applied: Other: lidocaine 4% Treatment Notes Electronic Signature(s) Signed: 12/03/2014 2:10:43 PM By: Junious Dresser RN Entered By: Junious Dresser on 12/03/2014 11:56:28 [name] ([id number]) -------------------------------------------------------------------------------- [place]  Details Patient Name: RAMSEY, MIDGETT. Date of Service: 12/03/2014 11:00 AM Medical Record Number: [id number] Patient Account Number: [id number] Date of Birth/Sex: 03-16-1927 (81 y.o. Female) Treating RN: Junious Dresser Primary Care Physician: Viviana Simpler Other Clinician: Referring Physician: Viviana Simpler Treating Physician/Extender: Frann Rider in Treatment: 17 Active Inactive Electronic Signature(s) Signed: 12/03/2014 2:10:43 PM By: Junious Dresser RN Entered By: Junious Dresser on 12/03/2014 11:47:36 Gargus, Leward Quan ([id number]) -------------------------------------------------------------------------------- Pain Assessment Details Patient Name: [name] Date of Service: 12/03/2014 11:00 AM Medical Record Number: [id number] Patient Account Number: [id number] Date of Birth/Sex: 1927-03-14 (81 y.o. Female) Treating RN: Junious Dresser Primary Care Physician: Viviana Simpler Other Clinician: Referring Physician: Viviana Simpler Treating Physician/Extender: Frann Rider in Treatment: 17 Active Problems Location of Pain Severity and Description of Pain Patient Has Paino No Site Locations Pain Management and Medication Current Pain Management: Electronic Signature(s) Signed: 12/03/2014 2:10:43 PM By: Junious Dresser RN Entered By: Junious Dresser on 12/03/2014 11:33:05 Dellis, Leward Quan ([id number]) -------------------------------------------------------------------------------- Patient/Caregiver Education Details Patient Name: [name] Date of Service: 12/03/2014 11:00 AM Medical Record Number: [id number] Patient Account Number: [id number] Date of Birth/Gender: [birth year] (81 y.o. Female) Treating RN: Montey Hora Primary Care Physician: Viviana Simpler Other Clinician: Referring Physician: Viviana Simpler Treating Physician/Extender: Frann Rider in Treatment: 17 Education Assessment Education Provided To: Patient Education Topics  Provided Wound/Skin Impairment: Handouts: Caring for Your Ulcer, Other: wound care as ordered Methods: Demonstration, Explain/Verbal Responses: State content correctly Electronic Signature(s) Signed: 12/03/2014 12:20:19 PM By: Montey Hora Entered By: Montey Hora on 12/03/2014 12:20:19 [name] ([id number]) -------------------------------------------------------------------------------- Wound Assessment Details Patient Name: [name]. Date of Service: 12/03/2014 11:00 AM Medical Record Number: [id number] Patient Account Number: [id number] Date of Birth/Sex: 05/29/1927 (81 y.o. Female) Treating RN: Montey Hora Primary Care Physician: Viviana Simpler Other Clinician: Referring Physician: Viviana Simpler Treating Physician/Extender: Frann Rider in Treatment: 17 Wound Status Wound Number: 5 Primary Trauma, Other Etiology: Wound Location: Right Head - frontal Wound Open Wounding Event: Trauma Status: Date Acquired: 12/01/2014 Comorbid Cataracts, Anemia, Arrhythmia, Weeks Of Treatment: 0 History: Rheumatoid Arthritis, Osteoarthritis, Clustered Wound: No Received Chemotherapy Photos Photo Uploaded By: Montey Hora on 12/03/2014 14:25:17 Wound Measurements Length: (cm) 2.9 Width: (cm) 5.9 Depth: (cm) 0.1 Area: (cm) 13.438 Volume: (cm) 1.344 % Reduction in Area: 0% % Reduction in Volume: 0% Epithelialization: Small (1-33%) Tunneling: No Undermining: No Wound Description Classification: Partial Thickness Foul Odor Aft Wound Margin: Flat and Intact Exudate Amount: Medium Exudate Type: Sanguinous Exudate Color: red er Cleansing: No Wound Bed Granulation Amount: Medium (34-66%) Exposed Structure Granulation Quality: Red Fascia Exposed: No Necrotic Amount: None Present (0%) Fat Layer Exposed: No Tendon Exposed: No NELDA, LUCKEY. ([id number]) Muscle Exposed: No Joint  Exposed: No Bone Exposed: No Limited to Skin Breakdown Periwound  Skin Texture Texture Color No Abnormalities Noted: No No Abnormalities Noted: No Callus: No Atrophie Blanche: No Crepitus: No Cyanosis: No Excoriation: No Ecchymosis: No Fluctuance: No Erythema: No Friable: No Hemosiderin Staining: No Induration: No Mottled: No Localized Edema: Yes Pallor: No Rash: No Rubor: No Scarring: No Temperature / Pain Moisture Temperature: No Abnormality No Abnormalities Noted: No Tenderness on Palpation: Yes Dry / Scaly: No Maceration: No Moist: No Wound Preparation Ulcer Cleansing: Rinsed/Irrigated with Saline Topical Anesthetic Applied: Other: lidocaine 4%, Treatment Notes Wound #5 (Right Head - frontal) 1. Cleansed with: Clean wound with Normal Saline 2. Anesthetic Topical Lidocaine 4% cream to wound bed prior to debridement 4. Dressing Applied: Prisma Ag 5. Secondary Dressing Applied Non-Adherent pad Notes mepitel one Electronic Signature(s) Signed: 12/03/2014 11:50:09 AM By: Montey Hora Entered By: Montey Hora on 12/03/2014 11:50:09 [name] ([id number]) -------------------------------------------------------------------------------- [place] Details Patient Name: [name] Date of Service: 12/03/2014 11:00 AM Medical Record Number: [id number] Patient Account Number: [id number] Date of Birth/Sex: [birth year] (81 y.o. Female) Treating RN: Junious Dresser Primary Care Physician: Viviana Simpler Other Clinician: Referring Physician: Viviana Simpler Treating Physician/Extender: Frann Rider in Treatment: 17 Vital Signs Time Taken: 11:33 Temperature (F): 97.7 Height (in): 62 Pulse (bpm): 63 Weight (lbs): 113.7 Respiratory Rate (breaths/min): 16 Body Mass Index (BMI): 20.8 Blood Pressure (mmHg): 138/70 Reference Range: 80 - 120 mg / dl Electronic Signature(s) Signed: 12/03/2014 2:10:43 PM By: Junious Dresser RN Entered By: Junious Dresser on 12/03/2014 11:34:28

## 2014-12-03 NOTE — Telephone Encounter (Signed)
As long as she is clear mentally and no neurologic signs like severe headache, vision change, weakness, etc--- can wait till tomorrow

## 2014-12-03 NOTE — Progress Notes (Signed)
TIMIYAH, ROMITO ([id number]) Visit Report for 12/03/2014 Chief Complaint Document Details Patient Name: [name], [name]. Date of Service: 12/03/2014 11:00 AM Medical Record Number: [id number] Patient Account Number: [id number] Date of Birth/Sex: June 21, 1927 (81 y.o. Female) Treating RN: Primary Care Physician: Viviana Simpler Other Clinician: Referring Physician: Viviana Simpler Treating Physician/Extender: Frann Rider in Treatment: 17 Information Obtained from: Patient Chief Complaint Patient presents to the wound care center for a consult due non healing wound. this pleasant 81 year old patient had a fall in her house 3 days ago and lacerated her scalp near the hairline on the right part of her forehead. Electronic Signature(s) Signed: 12/03/2014 1:06:57 PM By: Christin Fudge MD, FACS Entered By: Christin Fudge on 12/03/2014 12:51:16 [name] ([id number]) -------------------------------------------------------------------------------- HPI Details Patient Name: [name], [name]. Date of Service: 12/03/2014 11:00 AM Medical Record Number: [id number] Patient Account Number: [id number] Date of Birth/Sex: 02/06/1927 (81 y.o. Female) Treating RN: Primary Care Physician: Viviana Simpler Other Clinician: Referring Physician: Viviana Simpler Treating Physician/Extender: Frann Rider in Treatment: 17 History of Present Illness Location: right upper forehead near her scalp and hairline Quality: Patient reports experiencing a dull pain to affected area(s). Severity: Patient states wound (s) are getting better. Duration: Patient has had the wound for 3 days prior to presenting for treatment Timing: Pain in wound is Intermittent (comes and goes Context: she tripped over something and fell and hit her head against the hard object. Modifying Factors: not had any loss of consciousness, vision ranges or any persistent headaches. Associated Signs and Symptoms: no nausea  vomiting HPI Description: Pleasant 81 year old patient who is on Eliquist chronic atrial fibrillation was in her home when she tripped and fell and hit her head against a hard object 3 days ago. She did not seek any medical help nausea gone to see anyone. She kept this area covered and has not had any x-rays. The patient has not had any loss of consciousness, double vision, change in visit vision, headaches or nausea or vomiting. She had the wound covered and has not taken any other medications except her usual medications. no referral notes no is there any history of her past medical history her past surgical history. Electronic Signature(s) Signed: 12/03/2014 1:06:57 PM By: Christin Fudge MD, FACS Entered By: Christin Fudge on 12/03/2014 12:54:49 [name] ([id number]) -------------------------------------------------------------------------------- Physical Exam Details Patient Name: [name], [name]. Date of Service: 12/03/2014 11:00 AM Medical Record Number: [id number] Patient Account Number: [id number] Date of Birth/Sex: 06-Sep-1926 (81 y.o. Female) Treating RN: Primary Care Physician: Viviana Simpler Other Clinician: Referring Physician: Viviana Simpler Treating Physician/Extender: Frann Rider in Treatment: 17 Constitutional . Pulse regular. Respirations normal and unlabored. Afebrile. . Eyes Nonicteric. Reactive to light. Ears, Nose, Mouth, and Throat Lips, teeth, and gums WNL.Marland Kitchen Moist mucosa without lesions . Neck supple and nontender. No palpable supraclavicular or cervical adenopathy. Normal sized without goiter. Respiratory WNL. No retractions.. Cardiovascular Pedal Pulses WNL. No clubbing, cyanosis or edema. Integumentary (Hair, Skin) She has got a large laceration with de-gloving of areas on the right part of her forehead near her scalp. Part of the skin is completely denuded but part of it is still present and I was able to pull this back in position. There  is minimal active bleeding.Marland Kitchen No crepitus or fluctuance. No peri-wound warmth or erythema. No masses.Marland Kitchen Psychiatric Judgement and insight Intact.. No evidence of depression, anxiety, or agitation.. Electronic Signature(s) Signed: 12/03/2014 1:06:57 PM By: Christin Fudge MD, FACS Entered By: Christin Fudge on 12/03/2014  09:47:09 [name], [name] ([id number]) -------------------------------------------------------------------------------- Physician Orders Details Patient Name: [name], [name]. Date of Service: 12/03/2014 11:00 AM Medical Record Number: [id number] Patient Account Number: [id number] Date of Birth/Sex: April 20, 1927 (81 y.o. Female) Treating RN: Junious Dresser Primary Care Physician: Viviana Simpler Other Clinician: Referring Physician: Viviana Simpler Treating Physician/Extender: Frann Rider in Treatment: 66 Verbal / Phone Orders: Yes Clinician: Junious Dresser Read Back and Verified: Yes Diagnosis Coding Wound Cleansing Wound #5 Right Head - frontal o Clean wound with Normal Saline. Anesthetic Wound #5 Right Head - frontal o Topical Lidocaine 4% cream applied to wound bed prior to debridement Primary Wound Dressing Wound #5 Right Head - frontal o Contact layer o Prisma Ag Secondary Dressing Wound #5 Right Head - frontal o Non-adherent pad Dressing Change Frequency Wound #5 Right Head - frontal o Change dressing every other day. Follow-up Appointments Wound #5 Right Head - frontal o Return Appointment in 1 week. - Monday o Nurse Visit as needed - Friday, Wednesday Electronic Signature(s) Signed: 12/03/2014 1:06:57 PM By: Christin Fudge MD, FACS Signed: 12/03/2014 2:10:43 PM By: Junious Dresser RN Entered By: Junious Dresser on 12/03/2014 12:05:02 [name] ([id number]) -------------------------------------------------------------------------------- Problem List Details Patient Name: [name], [name]. Date of Service: 12/03/2014 11:00  AM Medical Record Number: [id number] Patient Account Number: [id number] Date of Birth/Sex: 10/08/1926 (81 y.o. Female) Treating RN: Primary Care Physician: Viviana Simpler Other Clinician: Referring Physician: Viviana Simpler Treating Physician/Extender: Frann Rider in Treatment: 36 Active Problems ICD-10 Encounter Code Description Active Date Diagnosis S01.01XA Laceration without foreign body of scalp, initial encounter 12/03/2014 Yes S01.81XA Laceration without foreign body of other part of head, 12/03/2014 Yes initial encounter Inactive Problems Resolved Problems Electronic Signature(s) Signed: 12/03/2014 1:06:57 PM By: Christin Fudge MD, FACS Entered By: Christin Fudge on 12/03/2014 12:50:27 [name] ([id number]) -------------------------------------------------------------------------------- Progress Note/History and Physical Details Patient Name: [name]. Date of Service: 12/03/2014 11:00 AM Medical Record Number: [id number] Patient Account Number: [id number] Date of Birth/Sex: 04-22-1927 (81 y.o. Female) Treating RN: Primary Care Physician: Viviana Simpler Other Clinician: Referring Physician: Viviana Simpler Treating Physician/Extender: Frann Rider in Treatment: 17 Subjective Chief Complaint Information obtained from Patient Patient presents to the wound care center for a consult due non healing wound. this pleasant 81 year old patient had a fall in her house 3 days ago and lacerated her scalp near the hairline on the right part of her forehead. History of Present Illness (HPI) The following HPI elements were documented for the patient's wound: Location: right upper forehead near her scalp and hairline Quality: Patient reports experiencing a dull pain to affected area(s). Severity: Patient states wound (s) are getting better. Duration: Patient has had the wound for 3 days prior to presenting for treatment Timing: Pain in wound is  Intermittent (comes and goes Context: she tripped over something and fell and hit her head against the hard object. Modifying Factors: not had any loss of consciousness, vision ranges or any persistent headaches. Associated Signs and Symptoms: no nausea vomiting Pleasant 81 year old patient who is on Eliquist chronic atrial fibrillation was in her home when she tripped and fell and hit her head against a hard object 3 days ago. She did not seek any medical help nausea gone to see anyone. She kept this area covered and has not had any x-rays. The patient has not had any loss of consciousness, double vision, change in visit vision, headaches or nausea or vomiting. She had the wound covered and has not taken any other  medications except her usual medications. no referral notes no is there any history of her past medical history her past surgical history. Wound History Patient presents with 1 open wound that has been present for approximately 12/01/14. Patient has been treating wound in the following manner: vaseline gauze. Laboratory tests have been performed in the last month. Patient reportedly has not tested positive for an antibiotic resistant organism. Patient reportedly has not tested positive for osteomyelitis. Patient History Information obtained from Patient. Allergies Penicillins (Severity: Severe, Reaction: anaphylaxis), sulfa drugs (Severity: Severe, Reaction: anaphylaxis), adhesive (Severity: Severe, Reaction: blistering), Pradaxa (Severity: Severe, Reaction: made her crazy), RAVENNA, LEGORE ([id number]) Ativan (Reaction: disoriented), Coumadin Family History Cancer - Father, Hereditary Spherocytosis - Mother, Hypertension - Mother, No family history of Diabetes, Heart Disease, Kidney Disease, Lung Disease, Seizures, Stroke, Thyroid Problems, Tuberculosis. Social History Former smoker, Marital Status - Married, Alcohol Use - Daily, Drug Use - No History, Caffeine Use - Daily  - coffee. Medical History Eyes Patient has history of Cataracts - lens implant Hematologic/Lymphatic Patient has history of Anemia Cardiovascular Patient has history of Arrhythmia - A-fib Integumentary (Skin) Denies history of History of Burn, History of pressure wounds Musculoskeletal Patient has history of Rheumatoid Arthritis, Osteoarthritis Oncologic Patient has history of Received Chemotherapy - methotrexate Denies history of Received Radiation Medical And Surgical History Notes Gastrointestinal GI bleed Endocrine thryoid Musculoskeletal back pain Review of Systems (ROS) Eyes The patient has no complaints or symptoms. Ear/Nose/Mouth/Throat The patient has no complaints or symptoms. Hematologic/Lymphatic The patient has no complaints or symptoms. Respiratory The patient has no complaints or symptoms. Endocrine The patient has no complaints or symptoms. Genitourinary The patient has no complaints or symptoms. Immunological The patient has no complaints or symptoms. Integumentary (Skin) [name], [name]. ([id number]) Complains or has symptoms of Wounds, Bleeding or bruising tendency. Neurologic The patient has no complaints or symptoms. Oncologic The patient has no complaints or symptoms. Medications Duralax unspecified unspecified unspecified acetaminophen ER 650 mg tablet,extended release oral tablet extended release oral oxycodone 15 mg tablet oral tablet oral OxyContin 20 mg tablet,crush resistant,extended release oral tablet,oral only,ext.rel.12 hr oral Glucosamine 1500 Complex 500 mg-400 mg capsule oral capsule oral Plaquenil 200 mg tablet oral 2 2 tablet oral methotrexate sodium 2.5 mg tablet oral 5 5 tablet oral on Wednesdays atenolol 25 mg tablet oral tablet oral Calcium 600+D3 Plus 600 mg calcium-800 unit-50 mg tablet oral tablet oral Eliquis 2.5 mg tablet oral tablet oral folic acid 1 mg tablet oral 2 2 tablet oral iron 65 mg-vitamin C 125 mg  tablet,delayed release oral tablet,delayed release (DR/EC) oral Colace 100 mg capsule oral capsule oral Milk of Magnesia 400 mg/5 mL oral suspension oral suspension oral Miralax 17 gram oral powder packet oral powder in packet oral furosemide 40 mg tablet oral tablet oral multivitamin tablet oral tablet oral Prilosec OTC 20 mg tablet,delayed release oral tablet,delayed release (DR/EC) oral Robaxin 500 mg tablet oral tablet oral Synthroid 75 mcg tablet oral tablet oral triamcinolone acetonide 0.1 % topical cream topical cream topical biotin 2,500 mcg capsule oral 2 2 capsule oral Vitamin B-12 ER 2,000 mcg tablet,extended release oral tablet extended release oral Objective Constitutional Pulse regular. Respirations normal and unlabored. Afebrile. Vitals Time Taken: 11:33 AM, Height: 62 in, Weight: 113.7 lbs, BMI: 20.8, Temperature: 97.7 F, Pulse: 63 bpm, Respiratory Rate: 16 breaths/min, Blood Pressure: 138/70 mmHg. Eyes Nonicteric. Reactive to light. [name], [name] ([id number]) Ears, Nose, Mouth, and Throat Lips, teeth, and gums WNL.Marland Kitchen Moist mucosa without lesions .  Neck supple and nontender. No palpable supraclavicular or cervical adenopathy. Normal sized without goiter. Respiratory WNL. No retractions.. Cardiovascular Pedal Pulses WNL. No clubbing, cyanosis or edema. Psychiatric Judgement and insight Intact.. No evidence of depression, anxiety, or agitation.. Integumentary (Hair, Skin) She has got a large laceration with de-gloving of areas on the right part of her forehead near her scalp. Part of the skin is completely denuded but part of it is still present and I was able to pull this back in position. There is minimal active bleeding.Marland Kitchen No crepitus or fluctuance. No peri-wound warmth or erythema. No masses.. Wound #5 status is Open. Original cause of wound was Trauma. The wound is located on the Right Head - frontal. The wound measures 2.9cm length x 5.9cm width x 0.1cm  depth; 13.438cm^2 area and 1.344cm^3 volume. The wound is limited to skin breakdown. There is no tunneling or undermining noted. There is a medium amount of sanguinous drainage noted. The wound margin is flat and intact. There is medium (34- 66%) red granulation within the wound bed. There is no necrotic tissue within the wound bed. The periwound skin appearance exhibited: Localized Edema. The periwound skin appearance did not exhibit: Callus, Crepitus, Excoriation, Fluctuance, Friable, Induration, Rash, Scarring, Dry/Scaly, Maceration, Moist, Atrophie Blanche, Cyanosis, Ecchymosis, Hemosiderin Staining, Mottled, Pallor, Rubor, Erythema. Periwound temperature was noted as No Abnormality. The periwound has tenderness on palpation. She has got a large laceration with de-gloving of areas on the right part of her forehead near her scalp. Part of the skin is completely denuded but part of it is still present and I was able to pull this back in position. There is minimal active bleeding. She has some ecchymosis around the area but she does not have any substantial hematoma. Assessment Active Problems ICD-10 S01.01XA - Laceration without foreign body of scalp, initial encounter S01.81XA - Laceration without foreign body of other part of head, initial encounter [name], [name]. ([id number]) I would recommend that we apply Prisma over the areas which have absolutely no skin cover and the rest of the areas to be covered with Mepitel. We will then apply a bordered foam dressing over this. As the patient is mobile she will come to the wound center for dressings every other day 3 times a week. I have spent a great deal of time asking the patient to see her PCP immediately to make sure that she is up- to-date with all her workup for syncope if this was a syncopal event. She also needs to make sure that she is up-to-date with all her vaccinations especially tetanus toxide. I have discussed signs and  symptoms of injury to her brain and hematoma formation in the subdural space. I have told her to report to the ER immediately if any of these occur. She will come and see me next week. Procedures I was able to clean this area with saline and then gently pulled some of the skin flaps which are based inferiorly. I was able to bring this as close as possible but there are several areas which are totally denuded of skin. There is minimum bleeding from the raw surfaces of the wound. Plan Wound Cleansing: Wound #5 Right Head - frontal: Clean wound with Normal Saline. Anesthetic: Wound #5 Right Head - frontal: Topical Lidocaine 4% cream applied to wound bed prior to debridement Primary Wound Dressing: Wound #5 Right Head - frontal: Contact layer Prisma Ag Secondary Dressing: Wound #5 Right Head - frontal: Non-adherent pad Dressing Change Frequency: Wound #  5 Right Head - frontal: Change dressing every other day. Follow-up Appointments: Wound #5 Right Head - frontal: Return Appointment in 1 week. - Monday [name], [name] ([id number]) Nurse Visit as needed - Friday, Wednesday I would recommend that we apply Prisma over the areas which have absolutely no skin cover and the rest of the areas to be covered with Mepitel. We will then apply a bordered foam dressing over this. As the patient is mobile she will come to the wound center for dressings every other day 3 times a week. I have spent a great deal of time asking the patient to see her PCP immediately to make sure that she is up- to-date with all her workup for syncope if this was a syncopal event. She also needs to make sure that she is up-to-date with all her vaccinations especially tetanus toxide. I have discussed signs and symptoms of injury to her brain and hematoma formation in the subdural space. I have told her to report to the ER immediately if any of these occur. She will come and see me next week. Electronic  Signature(s) Signed: 12/03/2014 1:06:57 PM By: Christin Fudge MD, FACS Entered By: Christin Fudge on 12/03/2014 13:00:33 [name] ([id number]) -------------------------------------------------------------------------------- ROS/PFSH Details Patient Name: [name] Date of Service: 12/03/2014 11:00 AM Medical Record Number: [id number] Patient Account Number: [id number] Date of Birth/Sex: [birth year] (81 y.o. Female) Treating RN: Junious Dresser Primary Care Physician: Viviana Simpler Other Clinician: Referring Physician: Viviana Simpler Treating Physician/Extender: Frann Rider in Treatment: 17 Label Progress Note Print Version as History and Physical for this encounter Information Obtained From Patient Wound History Do you currently have one or more open woundso Yes How many open wounds do you currently haveo 1 Approximately how long have you had your woundso 12/01/14 How have you been treating your wound(s) until nowo vaseline gauze Has your wound(s) ever healed and then re-openedo No Have you had any lab work done in the past montho Yes Who ordered the lab work doneo Dr. Silvio Pate Have you tested positive for an antibiotic resistant organism (MRSA, VRE)o No Have you tested positive for osteomyelitis (bone infection)o No Integumentary (Skin) Complaints and Symptoms: Positive for: Wounds; Bleeding or bruising tendency Medical History: Negative for: History of Burn; History of pressure wounds Eyes Complaints and Symptoms: No Complaints or Symptoms Medical History: Positive for: Cataracts - lens implant Ear/Nose/Mouth/Throat Complaints and Symptoms: No Complaints or Symptoms Hematologic/Lymphatic Complaints and Symptoms: No Complaints or Symptoms Medical History: [name], [name] ([id number]) Positive for: Anemia Respiratory Complaints and Symptoms: No Complaints or Symptoms Cardiovascular Medical History: Positive for: Arrhythmia -  A-fib Gastrointestinal Medical History: Past Medical History Notes: GI bleed Endocrine Complaints and Symptoms: No Complaints or Symptoms Medical History: Past Medical History Notes: thryoid Genitourinary Complaints and Symptoms: No Complaints or Symptoms Immunological Complaints and Symptoms: No Complaints or Symptoms Musculoskeletal Medical History: Positive for: Rheumatoid Arthritis; Osteoarthritis Past Medical History Notes: back pain Neurologic Complaints and Symptoms: No Complaints or Symptoms Oncologic Complaints and Symptoms: No Complaints or Symptoms KATYANA, TROLINGER ([id number]) Medical History: Positive for: Received Chemotherapy - methotrexate Negative for: Received Radiation HBO Extended History Items Eyes: Cataracts Family and Social History Cancer: Yes - Father; Diabetes: No; Heart Disease: No; Hereditary Spherocytosis: Yes - Mother; Hypertension: Yes - Mother; Kidney Disease: No; Lung Disease: No; Seizures: No; Stroke: No; Thyroid Problems: No; Tuberculosis: No; Former smoker; Marital Status - Married; Alcohol Use: Daily; Drug Use: No History; Caffeine Use: Daily - coffee; Financial Concerns:  No; Food, Clothing or Shelter Needs: No; Support System Lacking: No; Transportation Concerns: No; Advanced Directives: Yes (Not Provided); Patient does not want information on Advanced Directives; Do not resuscitate: Yes (Copy provided); Living Will: Yes (Not Provided); Medical Power of Attorney: Yes - james Kotowski (Not Provided) Physician Affirmation I have reviewed and agree with the above information. Electronic Signature(s) Signed: 12/03/2014 1:06:57 PM By: Christin Fudge MD, FACS Signed: 12/03/2014 2:10:43 PM By: Junious Dresser RN Entered By: Christin Fudge on 12/03/2014 12:46:41 [name] ([id number]) -------------------------------------------------------------------------------- SuperBill Details Patient Name: CLEASTER, SHIFFER. Date of Service:  12/03/2014 Medical Record Number: [id number] Patient Account Number: [id number] Date of Birth/Sex: 12-05-1926 (81 y.o. Female) Treating RN: Primary Care Physician: Viviana Simpler Other Clinician: Referring Physician: Viviana Simpler Treating Physician/Extender: Frann Rider in Treatment: 17 Diagnosis Coding ICD-10 Codes Code Description S01.01XA Laceration without foreign body of scalp, initial encounter S01.81XA Laceration without foreign body of other part of head, initial encounter Facility Procedures CPT4 Code: [id number] Description: 99213 - WOUND CARE VISIT-LEV 3 EST PT Modifier: Quantity: 1 Physician Procedures CPT4 Code Description: [id number] 99214 - WC PHYS LEVEL 4 - EST PT ICD-10 Description Diagnosis S01.01XA Laceration without foreign body of scalp, initial en S01.81XA Laceration without foreign body of other part of hea Modifier: counter d, initial enc Quantity: 1 Therapist, nutritional) Signed: 12/03/2014 1:06:57 PM By: Christin Fudge MD, FACS Entered By: Christin Fudge on 12/03/2014 13:00:54

## 2014-12-04 ENCOUNTER — Encounter: Payer: Self-pay | Admitting: Internal Medicine

## 2014-12-04 ENCOUNTER — Ambulatory Visit (INDEPENDENT_AMBULATORY_CARE_PROVIDER_SITE_OTHER): Payer: Medicare Other | Admitting: Internal Medicine

## 2014-12-04 VITALS — BP 120/70 | HR 88 | Temp 97.6°F | Wt 117.0 lb

## 2014-12-04 DIAGNOSIS — Q2733 Arteriovenous malformation of digestive system vessel: Secondary | ICD-10-CM | POA: Diagnosis not present

## 2014-12-04 DIAGNOSIS — K31819 Angiodysplasia of stomach and duodenum without bleeding: Secondary | ICD-10-CM

## 2014-12-04 DIAGNOSIS — S0091XA Abrasion of unspecified part of head, initial encounter: Secondary | ICD-10-CM | POA: Diagnosis not present

## 2014-12-04 LAB — CBC WITH DIFFERENTIAL/PLATELET
Basophils Absolute: 0.1 10*3/uL (ref 0.0–0.1)
Basophils Relative: 0.9 % (ref 0.0–3.0)
EOS PCT: 0 % (ref 0.0–5.0)
Eosinophils Absolute: 0 10*3/uL (ref 0.0–0.7)
HCT: 29.2 % — ABNORMAL LOW (ref 36.0–46.0)
Hemoglobin: 9.4 g/dL — ABNORMAL LOW (ref 12.0–15.0)
Lymphocytes Relative: 10.3 % — ABNORMAL LOW (ref 12.0–46.0)
Lymphs Abs: 0.7 10*3/uL (ref 0.7–4.0)
MCHC: 32.3 g/dL (ref 30.0–36.0)
MCV: 91.8 fl (ref 78.0–100.0)
MONO ABS: 0.6 10*3/uL (ref 0.1–1.0)
MONOS PCT: 8.9 % (ref 3.0–12.0)
Neutro Abs: 5.2 10*3/uL (ref 1.4–7.7)
Neutrophils Relative %: 79.9 % — ABNORMAL HIGH (ref 43.0–77.0)
PLATELETS: 364 10*3/uL (ref 150.0–400.0)
RBC: 3.18 Mil/uL — AB (ref 3.87–5.11)
RDW: 19.5 % — ABNORMAL HIGH (ref 11.5–15.5)
WBC: 6.5 10*3/uL (ref 4.0–10.5)

## 2014-12-04 NOTE — Progress Notes (Signed)
Subjective:    Patient ID: [name], female    DOB: 05-Jan-1927, 81 y.o.   MRN: [id number]  HPI [place] 3 days ago Was in sandals and fell up her stair Hit her right forehead and arm Some bleeding but controlled it No loss of conciousness  No headache No vision change No focal weakness No dysphagia or aphasia  Told to come here by the wound doctor when saw him  Current Outpatient Prescriptions on File Prior to Visit  Medication Sig Dispense Refill  . acetaminophen (TYLENOL) 650 MG CR tablet Take 650 mg by mouth every 8 (eight) hours as needed for pain.    Marland Kitchen apixaban (ELIQUIS) 2.5 MG TABS tablet Take 1 tablet (2.5 mg total) by mouth 2 (two) times daily. 180 tablet 4  . atenolol (TENORMIN) 25 MG tablet TAKE ONE TABLET BY MOUTH ONCE DAILY 90 tablet 3  . Biotin 5000 MCG CAPS Take by mouth daily.      Marland Kitchen CALCIUM CARBONATE PO Take 1,200 mg by mouth daily.    . CHONDROITIN SULFATE PO Take 1,200 mg by mouth daily.    . clotrimazole-betamethasone (LOTRISONE) cream Apply 1 application topically 2 (two) times daily. 30 g 0  . cyanocobalamin 2000 MCG tablet Take 2,000 mcg by mouth daily.    [name] Sodium (COLACE PO) Take by mouth as needed.    . folic acid (FOLVITE) 1 MG tablet Take 2 mg by mouth daily.     . furosemide (LASIX) 40 MG tablet TAKE ONE TABLET BY MOUTH ONCE DAILY AS NEEDED 90 tablet 3  . Glucosamine HCl (GLUCOSAMINE PO) Take 1,500 mg by mouth daily.    . hydroxychloroquine (PLAQUENIL) 200 MG tablet Take 400 mg by mouth daily.     Marland Kitchen levothyroxine (SYNTHROID, LEVOTHROID) 75 MCG tablet TAKE ONE TABLET BY MOUTH ONCE DAILY BEFORE BREAKFAST 90 tablet 3  . magnesium hydroxide (MILK OF MAGNESIA) 400 MG/5ML suspension Take by mouth daily as needed for mild constipation.    . methocarbamol (ROBAXIN) 500 MG tablet Take 500 mg by mouth as needed for muscle spasms.    . methotrexate (RHEUMATREX) 2.5 MG tablet Take 5 tablets on Wednesday.    . Multiple Vitamins-Minerals (ONE-A-DAY 50  PLUS PO) Take by mouth daily.      . mupirocin ointment (BACTROBAN) 2 % Apply 1 application topically 3 (three) times daily.    . OxyCODONE (OXYCONTIN) 20 mg T12A 12 hr tablet Take 20 mg by mouth every 12 (twelve) hours.     Marland Kitchen oxyCODONE (ROXICODONE) 15 MG immediate release tablet Take 15 mg by mouth every 4 (four) hours as needed for pain.    . Polyethylene Glycol 3350 (MIRALAX PO) Take by mouth as needed.     No current facility-administered medications on file prior to visit.    Allergies  Allergen Reactions  . Dabigatran Etexilate Mesylate Shortness Of Breath  . Coumadin [Warfarin Sodium] Other (See Comments)    hematoma  . Doxycycline Hyclate [Doxycycline] Itching  . Penicillins   . Pradaxa [Dabigatran Etexilate Mesylate] Other (See Comments)    SLEEP  . Sulfonamide Derivatives   . Tape Other (See Comments)    blisters  . Tramadol Hcl     Itching     Past Medical History  Diagnosis Date  . Arthritis   . Hyperlipidemia   . Hypothyroidism   . Atrial fibrillation     a. s/p prior DCCV's, now persistent-->Xarelto (CHA2DS2VASc = 3).  . Diverticulosis  a. by colonoscopy, h/o colon adenoma ([facility])  . Gastritis and duodenitis     a. 01/2014 EGD: Moderate non-erosive gastritis and mild non-erosive duodenitis.    Past Surgical History  Procedure Laterality Date  . Rotator cuff repair    . Intraocular lens implant, secondary  1996/ 2010  . Knee surgery      lateral  . Tonsillectomy    . Colonoscopy  02/2011    mod diverticulosis ([facility])  . Total knee arthroplasty Bilateral     Family History  Problem Relation Age of Onset  . Heart disease Mother     History   Social History  . Marital Status: Married    Spouse Name: N/A  . Number of Children: N/A  . Years of Education: N/A   Occupational History  . retired    Social History Main Topics  . Smoking status: Former Smoker    Types: Cigarettes  . Smokeless tobacco: Never Used  . Alcohol Use: Yes  . Drug  Use: No  . Sexual Activity: Not on file   Other Topics Concern  . Not on file   Social History Narrative   Regular exercise   Remains active with church and community activities      Has living will   Husband, then Barton Fanny (friend), is health care POA   Has DNR order---form done again 05/15/12   No tube feeds if cognitively unaware   Review of Systems No nausea or vomiting Appetite is okay Did start the iron yesterday    Objective:   Physical Exam  Musculoskeletal:  Normal ROM of right elbow  Neurological: She is alert. She has normal strength. No cranial nerve deficit. She exhibits normal muscle tone. She displays a negative Romberg sign. Coordination and gait normal.  Skin:  Skin abraded off on right forehead. Not really contused Wound is clean  Bruising on right elbow          Assessment & Plan:

## 2014-12-04 NOTE — Assessment & Plan Note (Signed)
Lost some blood with this fall Will recheck hemoglobin now--then again at her follow up later in June

## 2014-12-04 NOTE — Progress Notes (Signed)
Pre visit review using our clinic review tool, if applicable. No additional management support is needed unless otherwise documented below in the visit note. 

## 2014-12-04 NOTE — Assessment & Plan Note (Signed)
Looks like mostly a rug burn No neurologic findings and now 3 days later Reassured Seeing Dr Matthias Hughs at wound clinic

## 2014-12-05 ENCOUNTER — Encounter: Payer: Self-pay | Admitting: Internal Medicine

## 2014-12-06 ENCOUNTER — Encounter: Payer: Self-pay | Admitting: Internal Medicine

## 2014-12-06 ENCOUNTER — Encounter: Payer: Medicare Other | Attending: Surgery

## 2014-12-06 DIAGNOSIS — I482 Chronic atrial fibrillation: Secondary | ICD-10-CM | POA: Insufficient documentation

## 2014-12-06 DIAGNOSIS — S0101XA Laceration without foreign body of scalp, initial encounter: Secondary | ICD-10-CM | POA: Insufficient documentation

## 2014-12-06 DIAGNOSIS — S0181XA Laceration without foreign body of other part of head, initial encounter: Secondary | ICD-10-CM | POA: Insufficient documentation

## 2014-12-06 DIAGNOSIS — W19XXXA Unspecified fall, initial encounter: Secondary | ICD-10-CM | POA: Insufficient documentation

## 2014-12-09 ENCOUNTER — Other Ambulatory Visit: Payer: Medicare Other

## 2014-12-09 ENCOUNTER — Encounter: Payer: Medicare Other | Attending: Surgery | Admitting: Surgery

## 2014-12-09 DIAGNOSIS — W19XXXA Unspecified fall, initial encounter: Secondary | ICD-10-CM | POA: Insufficient documentation

## 2014-12-09 DIAGNOSIS — S0101XA Laceration without foreign body of scalp, initial encounter: Secondary | ICD-10-CM | POA: Insufficient documentation

## 2014-12-09 DIAGNOSIS — S0181XA Laceration without foreign body of other part of head, initial encounter: Secondary | ICD-10-CM | POA: Diagnosis not present

## 2014-12-09 DIAGNOSIS — R600 Localized edema: Secondary | ICD-10-CM | POA: Insufficient documentation
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
## 2014-12-10 NOTE — Progress Notes (Signed)
[name] ([id number]) Visit Report for 12/09/2014 Chief Complaint Document Details Patient Name: [name], [name]. Date of Service: 12/09/2014 1:45 PM Medical Record Number: [id number] Patient Account Number: [id number] Date of Birth/Sex: 19-Nov-1926 (81 y.o. Female) Treating RN: Primary Care Physician: Viviana Simpler Other Clinician: Referring Physician: Viviana Simpler Treating Physician/Extender: Frann Rider in Treatment: 17 Information Obtained from: Patient Chief Complaint Patient presents to the wound care center for a consult due non healing wound. this pleasant 81 year old patient had a fall in her house 3 days ago and lacerated her scalp near the hairline on the right part of her forehead. Electronic Signature(s) Signed: 12/09/2014 4:42:08 PM By: Christin Fudge MD, FACS Entered By: Christin Fudge on 12/09/2014 14:18:16 [name] ([id number]) -------------------------------------------------------------------------------- Debridement Details Patient Name: [name] Date of Service: 12/09/2014 1:45 PM Medical Record Number: [id number] Patient Account Number: [id number] Date of Birth/Sex: August 24, 1926 (81 y.o. Female) Treating RN: Primary Care Physician: Viviana Simpler Other Clinician: Referring Physician: Viviana Simpler Treating Physician/Extender: Frann Rider in Treatment: 17 Debridement Performed for Wound #5 Right Head - frontal Assessment: Performed By: Physician Pat Patrick., MD Debridement: Open Wound/Selective Debridement Selective Description: Pre-procedure Yes Verification/Time Out Taken: Start Time: 14:13 Pain Control: Lidocaine 4% Topical Solution Level: Non-Viable Tissue Total Area Debrided (L x 1.2 (cm) x 5.5 (cm) = 6.6 (cm) W): Tissue and other Non-Viable, Eschar, Fibrin/Slough material debrided: Instrument: Forceps Bleeding: Minimum Hemostasis Achieved: Pressure End Time: 14:16 Procedural Pain: 0 Post Procedural  Pain: 0 Response to Treatment: Procedure was tolerated well Post Debridement Measurements of Total Wound Length: (cm) 1.2 Width: (cm) 5.5 Depth: (cm) 0.1 Volume: (cm) 0.518 Electronic Signature(s) Signed: 12/09/2014 4:42:08 PM By: Christin Fudge MD, FACS Entered By: Christin Fudge on 12/09/2014 14:18:08 [name] ([id number]) -------------------------------------------------------------------------------- HPI Details Patient Name: [name]. Date of Service: 12/09/2014 1:45 PM Medical Record Number: [id number] Patient Account Number: [id number] Date of Birth/Sex: June 20, 1927 (81 y.o. Female) Treating RN: Primary Care Physician: Viviana Simpler Other Clinician: Referring Physician: Viviana Simpler Treating Physician/Extender: Frann Rider in Treatment: 17 History of Present Illness Location: right upper forehead near her scalp and hairline Quality: Patient reports experiencing a dull pain to affected area(s). Severity: Patient states wound (s) are getting better. Duration: Patient has had the wound for 3 days prior to presenting for treatment Timing: Pain in wound is Intermittent (comes and goes Context: she tripped over something and fell and hit her head against the hard object. Modifying Factors: not had any loss of consciousness, vision ranges or any persistent headaches. Associated Signs and Symptoms: no nausea vomiting HPI Description: Pleasant 81 year old patient who is on Eliquist chronic atrial fibrillation was in her home when she tripped and fell and hit her head against a hard object 3 days ago. She did not seek any medical help nausea gone to see anyone. She kept this area covered and has not had any x-rays. The patient has not had any loss of consciousness, double vision, change in visit vision, headaches or nausea or vomiting. She had the wound covered and has not taken any other medications except her usual medications. no referral notes no is there  any history of her past medical history her past surgical history. 12/09/2014 --she has had no symptoms of any closed head injury and has seen her PCP for her appropriate management. She seems to be doing fine. Electronic Signature(s) Signed: 12/09/2014 4:42:08 PM By: Christin Fudge MD, FACS Entered By: Christin Fudge on 12/09/2014 14:19:00 Dung, Remo Lipps  Chauncey Cruel ([id number]) -------------------------------------------------------------------------------- Physical Exam Details Patient Name: [name]. Date of Service: 12/09/2014 1:45 PM Medical Record Number: [id number] Patient Account Number: [id number] Date of Birth/Sex: [birth year] (81 y.o. Female) Treating RN: Primary Care Physician: Viviana Simpler Other Clinician: Referring Physician: Viviana Simpler Treating Physician/Extender: Frann Rider in Treatment: 17 Constitutional . Pulse regular. Respirations normal and unlabored. Afebrile. . Eyes Nonicteric. Reactive to light. Ears, Nose, Mouth, and Throat Lips, teeth, and gums WNL.Marland Kitchen Moist mucosa without lesions . Neck supple and nontender. No palpable supraclavicular or cervical adenopathy. Normal sized without goiter. Respiratory WNL. No retractions.. Integumentary (Hair, Skin) the wound looks much better than last week and the eschar some of it is dry and some small areas of open raw dermis.. No crepitus or fluctuance. No peri-wound warmth or erythema. No masses.Marland Kitchen Psychiatric Judgement and insight Intact.. No evidence of depression, anxiety, or agitation.. Electronic Signature(s) Signed: 12/09/2014 4:42:08 PM By: Christin Fudge MD, FACS Entered By: Christin Fudge on 12/09/2014 14:19:55 [name] ([id number]) -------------------------------------------------------------------------------- Physician Orders Details Patient Name: [name]. Date of Service: 12/09/2014 1:45 PM Medical Record Number: [id number] Patient Account Number: [id number] Date of Birth/Sex:  Sep 07, 1926 (81 y.o. Female) Treating RN: Montey Hora Primary Care Physician: Viviana Simpler Other Clinician: Referring Physician: Viviana Simpler Treating Physician/Extender: Frann Rider in Treatment: 50 Verbal / Phone Orders: Yes Clinician: Montey Hora Read Back and Verified: Yes Diagnosis Coding Wound Cleansing Wound #5 Right Head - frontal o Clean wound with Normal Saline. Anesthetic Wound #5 Right Head - frontal o Topical Lidocaine 4% cream applied to wound bed prior to debridement Primary Wound Dressing Wound #5 Right Head - frontal o Contact layer o Prisma Ag Secondary Dressing Wound #5 Right Head - frontal o Non-adherent pad Dressing Change Frequency Wound #5 Right Head - frontal o Change dressing every other day. Follow-up Appointments Wound #5 Right Head - frontal o Return Appointment in 1 week. - Monday o Nurse Visit as needed - Friday, Wednesday Electronic Signature(s) Signed: 12/09/2014 4:42:08 PM By: Christin Fudge MD, FACS Signed: 12/09/2014 5:11:42 PM By: Montey Hora Entered By: Montey Hora on 12/09/2014 14:16:07 [name] ([id number]) -------------------------------------------------------------------------------- Problem List Details Patient Name: ASHLYN, CABLER. Date of Service: 12/09/2014 1:45 PM Medical Record Number: [id number] Patient Account Number: [id number] Date of Birth/Sex: 1926/12/30 (81 y.o. Female) Treating RN: Primary Care Physician: Viviana Simpler Other Clinician: Referring Physician: Viviana Simpler Treating Physician/Extender: Frann Rider in Treatment: 5 Active Problems ICD-10 Encounter Code Description Active Date Diagnosis S01.01XA Laceration without foreign body of scalp, initial encounter 12/03/2014 Yes S01.81XA Laceration without foreign body of other part of head, 12/03/2014 Yes initial encounter Inactive Problems Resolved Problems Electronic Signature(s) Signed: 12/09/2014  4:42:08 PM By: Christin Fudge MD, FACS Entered By: Christin Fudge on 12/09/2014 14:17:32 Kanitz, Leward Quan ([id number]) -------------------------------------------------------------------------------- Progress Note Details Patient Name: [name]. Date of Service: 12/09/2014 1:45 PM Medical Record Number: [id number] Patient Account Number: [id number] Date of Birth/Sex: May 16, 1927 (81 y.o. Female) Treating RN: Primary Care Physician: Viviana Simpler Other Clinician: Referring Physician: Viviana Simpler Treating Physician/Extender: Frann Rider in Treatment: 17 Subjective Chief Complaint Information obtained from Patient Patient presents to the wound care center for a consult due non healing wound. this pleasant 81 year old patient had a fall in her house 3 days ago and lacerated her scalp near the hairline on the right part of her forehead. History of Present Illness (HPI) The following HPI elements were documented for the patient's wound: Location: right upper forehead near  her scalp and hairline Quality: Patient reports experiencing a dull pain to affected area(s). Severity: Patient states wound (s) are getting better. Duration: Patient has had the wound for 3 days prior to presenting for treatment Timing: Pain in wound is Intermittent (comes and goes Context: she tripped over something and fell and hit her head against the hard object. Modifying Factors: not had any loss of consciousness, vision ranges or any persistent headaches. Associated Signs and Symptoms: no nausea vomiting Pleasant 81 year old patient who is on Eliquist chronic atrial fibrillation was in her home when she tripped and fell and hit her head against a hard object 3 days ago. She did not seek any medical help nausea gone to see anyone. She kept this area covered and has not had any x-rays. The patient has not had any loss of consciousness, double vision, change in visit vision, headaches or nausea or  vomiting. She had the wound covered and has not taken any other medications except her usual medications. no referral notes no is there any history of her past medical history her past surgical history. 12/09/2014 --she has had no symptoms of any closed head injury and has seen her PCP for her appropriate management. She seems to be doing fine. Objective Constitutional Pulse regular. Respirations normal and unlabored. Afebrile. ATOYA, ANDREW ([id number]) Vitals Time Taken: 1:57 PM, Height: 62 in, Weight: 113.7 lbs, BMI: 20.8, Temperature: 97.6 F, Pulse: 64 bpm, Respiratory Rate: 16 breaths/min, Blood Pressure: 118/58 mmHg. Eyes Nonicteric. Reactive to light. Ears, Nose, Mouth, and Throat Lips, teeth, and gums WNL.Marland Kitchen Moist mucosa without lesions . Neck supple and nontender. No palpable supraclavicular or cervical adenopathy. Normal sized without goiter. Respiratory WNL. No retractions.Marland Kitchen Psychiatric Judgement and insight Intact.. No evidence of depression, anxiety, or agitation.. Integumentary (Hair, Skin) the wound looks much better than last week and the eschar some of it is dry and some small areas of open raw dermis.. No crepitus or fluctuance. No peri-wound warmth or erythema. No masses.. Wound #5 status is Open. Original cause of wound was Trauma. The wound is located on the Right Head - frontal. The wound measures 1.2cm length x 5.5cm width x 0.1cm depth; 5.184cm^2 area and 0.518cm^3 volume. The wound is limited to skin breakdown. There is a medium amount of sanguinous drainage noted. The wound margin is flat and intact. There is medium (34-66%) red granulation within the wound bed. There is a small (1-33%) amount of necrotic tissue within the wound bed including Eschar. The periwound skin appearance exhibited: Localized Edema. The periwound skin appearance did not exhibit: Callus, Crepitus, Excoriation, Fluctuance, Friable, Induration, Rash, Scarring, Dry/Scaly, Maceration,  Moist, Atrophie Blanche, Cyanosis, Ecchymosis, Hemosiderin Staining, Mottled, Pallor, Rubor, Erythema. Periwound temperature was noted as No Abnormality. The periwound has tenderness on palpation. Assessment Active Problems ICD-10 S01.01XA - Laceration without foreign body of scalp, initial encounter S01.81XA - Laceration without foreign body of other part of head, initial encounter TAREVA, LESKE ([id number]) Procedures Wound #5 Wound #5 is a Trauma, Other located on the Right Head - frontal . There was a Non-Viable Tissue Open Wound/Selective [phone]) debridement with total area of 6.6 sq cm performed by Braydee Shimkus, Jackson Latino., MD. with the following instrument(s): Forceps to remove Non-Viable tissue/material including Fibrin/Slough and Eschar after achieving pain control using Lidocaine 4% Topical Solution. A time out was conducted prior to the start of the procedure. A Minimum amount of bleeding was controlled with Pressure. The procedure was tolerated well with a pain level of 0  throughout and a pain level of 0 following the procedure. Post Debridement Measurements: 1.2cm length x 5.5cm width x 0.1cm depth; 0.518cm^3 volume. Plan Wound Cleansing: Wound #5 Right Head - frontal: Clean wound with Normal Saline. Anesthetic: Wound #5 Right Head - frontal: Topical Lidocaine 4% cream applied to wound bed prior to debridement Primary Wound Dressing: Wound #5 Right Head - frontal: Contact layer Prisma Ag Secondary Dressing: Wound #5 Right Head - frontal: Non-adherent pad Dressing Change Frequency: Wound #5 Right Head - frontal: Change dressing every other day. Follow-up Appointments: Wound #5 Right Head - frontal: Return Appointment in 1 week. - Monday Nurse Visit as needed - Friday, Wednesday I would recommend that we apply Prisma over the areas which have absolutely no skin cover and the rest of the areas to be covered with Mepitel. We will then apply a bordered foam dressing  over this. As the patient is mobile she will come to the wound center for dressings every other day 3 times a week. CHENA, CHOHAN ([id number]) I will see her back next week. Electronic Signature(s) Signed: 12/09/2014 4:42:08 PM By: Christin Fudge MD, FACS Entered By: Christin Fudge on 12/09/2014 14:20:40 [name] ([id number]) -------------------------------------------------------------------------------- SuperBill Details Patient Name: [name] Date of Service: 12/09/2014 Medical Record Number: [id number] Patient Account Number: [id number] Date of Birth/Sex: [birth year] (81 y.o. Female) Treating RN: Primary Care Physician: Viviana Simpler Other Clinician: Referring Physician: Viviana Simpler Treating Physician/Extender: Frann Rider in Treatment: 17 Diagnosis Coding ICD-10 Codes Code Description S01.01XA Laceration without foreign body of scalp, initial encounter S01.81XA Laceration without foreign body of other part of head, initial encounter Facility Procedures CPT4 Code Description: [id number] - DEBRIDE WOUND 1ST 20 SQ CM OR < ICD-10 Description Diagnosis S01.01XA Laceration without foreign body of scalp, initial enc S01.81XA Laceration without foreign body of other part of head Modifier: ounter , initial enc Quantity: 1 ounter Physician Procedures CPT4 Code Description: [id number] - WC PHYS DEBR WO ANESTH 20 SQ CM ICD-10 Description Diagnosis S01.01XA Laceration without foreign body of scalp, initial enc S01.81XA Laceration without foreign body of other part of head Modifier: ounter , initial enc Quantity: 1 Therapist, nutritional) Signed: 12/09/2014 4:42:08 PM By: Christin Fudge MD, FACS Entered By: Christin Fudge on 12/09/2014 14:20:52

## 2014-12-11 ENCOUNTER — Encounter: Payer: Self-pay | Admitting: Internal Medicine

## 2014-12-12 DIAGNOSIS — W19XXXA Unspecified fall, initial encounter: Secondary | ICD-10-CM | POA: Diagnosis not present

## 2014-12-12 DIAGNOSIS — I482 Chronic atrial fibrillation: Secondary | ICD-10-CM | POA: Diagnosis not present

## 2014-12-12 DIAGNOSIS — S0101XA Laceration without foreign body of scalp, initial encounter: Secondary | ICD-10-CM | POA: Diagnosis present

## 2014-12-12 DIAGNOSIS — S0181XA Laceration without foreign body of other part of head, initial encounter: Secondary | ICD-10-CM | POA: Diagnosis not present

## 2014-12-14 NOTE — Progress Notes (Signed)
[name], [name] ([id number]) Visit Report for 12/12/2014 Arrival Information Details Patient Name: [name], [name]. Date of Service: 12/12/2014 11:45 AM Medical Record Number: [id number] Patient Account Number: [id number] Date of Birth/Sex: 11-Dec-1926 (81 y.o. Female) Treating RN: Cornell Barman Primary Care Physician: Viviana Simpler Other Clinician: Referring Physician: Viviana Simpler Treating Physician/Extender: Frann Rider in Treatment: 18 Visit Information History Since Last Visit Added or deleted any medications: No Patient Arrived: Ambulatory Any new allergies or adverse reactions: No Arrival Time: 11:58 Had a fall or experienced change in No Accompanied By: self activities of daily living that may affect Transfer Assistance: None risk of falls: Patient Identification Verified: Yes Signs or symptoms of abuse/neglect since last No Secondary Verification Process Yes visito Completed: Hospitalized since last visit: No Patient Has Alerts: Yes Has Dressing in Place as Prescribed: Yes Patient Alerts: Patient on Blood Pain Present Now: No Thinner Electronic Signature(s) Signed: 12/13/2014 4:53:24 PM By: Gretta Cool, RN, BSN, Kim RN, BSN Entered By: Gretta Cool, RN, BSN, Kim on 12/12/2014 11:58:39 Oleary, Leward Quan ([id number]) -------------------------------------------------------------------------------- Clinic Level of Care Assessment Details Patient Name: [name]. Date of Service: 12/12/2014 11:45 AM Medical Record Number: [id number] Patient Account Number: [id number] Date of Birth/Sex: [birth year] (81 y.o. Female) Treating RN: Cornell Barman Primary Care Physician: Viviana Simpler Other Clinician: Referring Physician: Viviana Simpler Treating Physician/Extender: Frann Rider in Treatment: 18 Clinic Level of Care Assessment Items TOOL 4 Quantity Score []  - Use when only an EandM is performed on FOLLOW-UP visit 0 ASSESSMENTS - Nursing Assessment / Reassessment []  -  Reassessment of Co-morbidities (includes updates in patient status) 0 []  - Reassessment of Adherence to Treatment Plan 0 ASSESSMENTS - Wound and Skin Assessment / Reassessment []  - Simple Wound Assessment / Reassessment - one wound 0 []  - Complex Wound Assessment / Reassessment - multiple wounds 0 []  - Dermatologic / Skin Assessment (not related to wound area) 0 ASSESSMENTS - Focused Assessment []  - Circumferential Edema Measurements - multi extremities 0 []  - Nutritional Assessment / Counseling / Intervention 0 []  - Lower Extremity Assessment (monofilament, tuning fork, pulses) 0 []  - Peripheral Arterial Disease Assessment (using hand held doppler) 0 ASSESSMENTS - Ostomy and/or Continence Assessment and Care []  - Incontinence Assessment and Management 0 []  - Ostomy Care Assessment and Management (repouching, etc.) 0 PROCESS - Coordination of Care []  - Simple Patient / Family Education for ongoing care 0 []  - Complex (extensive) Patient / Family Education for ongoing care 0 []  - Staff obtains Programmer, systems, Records, Test Results / Process Orders 0 []  - Staff telephones HHA, Nursing Homes / Clarify orders / etc 0 []  - Routine Transfer to another Facility (non-emergent condition) 0 [name], [name] ([id number]) []  - Routine Hospital Admission (non-emergent condition) 0 []  - New Admissions / Biomedical engineer / Ordering NPWT, Apligraf, etc. 0 []  - Emergency Hospital Admission (emergent condition) 0 []  - Simple Discharge Coordination 0 []  - Complex (extensive) Discharge Coordination 0 PROCESS - Special Needs []  - Pediatric / Minor Patient Management 0 []  - Isolation Patient Management 0 []  - Hearing / Language / Visual special needs 0 []  - Assessment of Community assistance (transportation, D/C planning, etc.) 0 []  - Additional assistance / Altered mentation 0 []  - Support Surface(s) Assessment (bed, cushion, seat, etc.) 0 INTERVENTIONS - Wound Cleansing / Measurement X - Simple Wound  Cleansing - one wound 1 5 []  - Complex Wound Cleansing - multiple wounds 0 []  - Wound Imaging (photographs - any number of wounds) 0 []  -  Wound Tracing (instead of photographs) 0 []  - Simple Wound Measurement - one wound 0 []  - Complex Wound Measurement - multiple wounds 0 INTERVENTIONS - Wound Dressings []  - Small Wound Dressing one or multiple wounds 0 X - Medium Wound Dressing one or multiple wounds 1 15 []  - Large Wound Dressing one or multiple wounds 0 []  - Application of Medications - topical 0 []  - Application of Medications - injection 0 INTERVENTIONS - Miscellaneous []  - External ear exam 0 [name], [name]. ([id number]) []  - Specimen Collection (cultures, biopsies, blood, body fluids, etc.) 0 []  - Specimen(s) / Culture(s) sent or taken to Lab for analysis 0 []  - Patient Transfer (multiple staff / Harrel Lemon Lift / Similar devices) 0 []  - Simple Staple / Suture removal (25 or less) 0 []  - Complex Staple / Suture removal (26 or more) 0 []  - Hypo / Hyperglycemic Management (close monitor of Blood Glucose) 0 []  - Ankle / Brachial Index (ABI) - do not check if billed separately 0 []  - Vital Signs 0 Has the patient been seen at the hospital within the last three years: Yes Total Score: 20 Level Of Care: New/Established - Level 1 Electronic Signature(s) Signed: 12/13/2014 4:53:24 PM By: Gretta Cool, RN, BSN, Kim RN, BSN Entered By: Gretta Cool, RN, BSN, Kim on 12/12/2014 12:00:04 [name] ([id number]) -------------------------------------------------------------------------------- Encounter Discharge Information Details Patient Name: [name], [name]. Date of Service: 12/12/2014 11:45 AM Medical Record Number: [id number] Patient Account Number: [id number] Date of Birth/Sex: 07-11-1926 (81 y.o. Female) Treating RN: Cornell Barman Primary Care Physician: Viviana Simpler Other Clinician: Referring Physician: Viviana Simpler Treating Physician/Extender: Frann Rider in Treatment:  42 Encounter Discharge Information Items Discharge Pain Level: 0 Discharge Condition: Stable Ambulatory Status: Ambulatory Discharge Destination: Home Private Transportation: Auto Accompanied By: self Schedule Follow-up Appointment: Yes Medication Reconciliation completed and Yes provided to Patient/Care Eino Whitner: Clinical Summary of Care: Electronic Signature(s) Signed: 12/13/2014 4:53:24 PM By: Gretta Cool, RN, BSN, Kim RN, BSN Entered By: Gretta Cool, RN, BSN, Kim on 12/12/2014 11:59:47 [name] ([id number]) -------------------------------------------------------------------------------- Patient/Caregiver Education Details Patient Name: [name] Date of Service: 12/12/2014 11:45 AM Medical Record Number: [id number] Patient Account Number: [id number] Date of Birth/Gender: [birth year] (81 y.o. Female) Treating RN: Cornell Barman Primary Care Physician: Viviana Simpler Other Clinician: Referring Physician: Viviana Simpler Treating Physician/Extender: Frann Rider in Treatment: 61 Education Assessment Education Provided To: Patient Education Topics Provided Wound/Skin Impairment: Handouts: Caring for Your Ulcer, Other: Do not get dressing wet Methods: Explain/Verbal Responses: State content correctly Electronic Signature(s) Signed: 12/13/2014 4:53:24 PM By: Gretta Cool, RN, BSN, Kim RN, BSN Entered By: Gretta Cool, RN, BSN, Kim on 12/12/2014 11:59:33

## 2014-12-14 NOTE — Progress Notes (Signed)
Jacqueline, [name] ([id number]) Visit Report for 12/12/2014 Physician Orders Details Patient Name: Jacqueline, [name]. Date of Service: 12/12/2014 11:45 AM Medical Record Number: [id number] Patient Account Number: [id number] Date of Birth/Sex: 05/27/1927 (81 y.o. Female) Treating RN: Cornell Barman Primary Care Physician: Viviana Simpler Other Clinician: Referring Physician: Viviana Simpler Treating Physician/Extender: Frann Rider in Treatment: 59 Verbal / Phone Orders: Yes Clinician: Cornell Barman Read Back and Verified: Yes Diagnosis Coding Primary Wound Dressing Wound #5 Right Head - frontal o Prisma Ag Dressing Change Frequency Wound #5 Right Head - frontal o Dressing is to be changed Monday and Thursday. Follow-up Appointments Wound #5 Right Head - frontal o Return Appointment in 1 week. Notes mepitel one over [employer]) Signed: 12/12/2014 4:41:50 PM By: Christin Fudge MD, FACS Signed: 12/13/2014 4:53:24 PM By: Gretta Cool RN, BSN, Kim RN, BSN Entered By: Gretta Cool, RN, BSN, Kim on 12/12/2014 12:01:41 [name] ([id number]) -------------------------------------------------------------------------------- [place] Details Patient Name: Jacqueline, [name]. Date of Service: 12/12/2014 Medical Record Number: [id number] Patient Account Number: [id number] Date of Birth/Sex: 06/16/1927 (81 y.o. Female) Treating RN: Cornell Barman Primary Care Physician: Viviana Simpler Other Clinician: Referring Physician: Viviana Simpler Treating Physician/Extender: Frann Rider in Treatment: 18 Diagnosis Coding ICD-10 Codes Code Description S01.01XA Laceration without foreign body of scalp, initial encounter S01.81XA Laceration without foreign body of other part of head, initial encounter Facility Procedures CPT4 Code: 22336122 Description: [phone] - WOUND CARE VISIT-LEV 1 EST PT Modifier: Quantity: 1 Electronic Signature(s) Signed: 12/13/2014 12:20:52 PM By: Christin Fudge  MD, FACS Signed: 12/13/2014 4:53:24 PM By: Gretta Cool RN, BSN, Kim RN, BSN Entered By: Gretta Cool, RN, BSN, Kim on 12/12/2014 16:55:28

## 2014-12-16 ENCOUNTER — Encounter: Payer: Medicare Other | Admitting: Surgery

## 2014-12-16 DIAGNOSIS — S0101XA Laceration without foreign body of scalp, initial encounter: Secondary | ICD-10-CM | POA: Diagnosis not present

## 2014-12-16 NOTE — Progress Notes (Signed)
TRENIA, TENNYSON ([id number]) Visit Report for 12/16/2014 Chief Complaint Document Details Patient Name: [name], [name]. Date of Service: 12/16/2014 1:45 PM Medical Record Number: [id number] Patient Account Number: [id number] Date of Birth/Sex: December 18, 1926 (81 y.o. Female) Treating RN: Primary Care Physician: Viviana Simpler Other Clinician: Referring Physician: Viviana Simpler Treating Physician/Extender: Frann Rider in Treatment: 18 Information Obtained from: Patient Chief Complaint Patient presents to the wound care center for a consult due non healing wound. this pleasant 81 year old patient had a fall in her house 3 days ago and lacerated her scalp near the hairline on the right part of her forehead. Electronic Signature(s) Signed: 12/16/2014 4:38:19 PM By: Christin Fudge MD, FACS Entered By: Christin Fudge on 12/16/2014 14:03:01 [name] ([id number]) -------------------------------------------------------------------------------- HPI Details Patient Name: [name], [name]. Date of Service: 12/16/2014 1:45 PM Medical Record Number: [id number] Patient Account Number: [id number] Date of Birth/Sex: April 02, 1927 (81 y.o. Female) Treating RN: Primary Care Physician: Viviana Simpler Other Clinician: Referring Physician: Viviana Simpler Treating Physician/Extender: Frann Rider in Treatment: 18 History of Present Illness Location: right upper forehead near her scalp and hairline Quality: Patient reports experiencing a dull pain to affected area(s). Severity: Patient states wound (s) are getting better. Duration: Patient has had the wound for 3 days prior to presenting for treatment Timing: Pain in wound is Intermittent (comes and goes Context: she tripped over something and fell and hit her head against the hard object. Modifying Factors: not had any loss of consciousness, vision ranges or any persistent headaches. Associated Signs and Symptoms: no nausea  vomiting HPI Description: Pleasant 81 year old patient who is on Eliquist chronic atrial fibrillation was in her home when she tripped and fell and hit her head against a hard object 3 days ago. She did not seek any medical help nausea gone to see anyone. She kept this area covered and has not had any x-rays. The patient has not had any loss of consciousness, double vision, change in visit vision, headaches or nausea or vomiting. She had the wound covered and has not taken any other medications except her usual medications. no referral notes no is there any history of her past medical history her past surgical history. 12/09/2014 --she has had no symptoms of any closed head injury and has seen her PCP for her appropriate management. She seems to be doing fine. Electronic Signature(s) Signed: 12/16/2014 4:38:19 PM By: Christin Fudge MD, FACS Entered By: Christin Fudge on 12/16/2014 14:03:08 [name] ([id number]) -------------------------------------------------------------------------------- Physical Exam Details Patient Name: [name], [name]. Date of Service: 12/16/2014 1:45 PM Medical Record Number: [id number] Patient Account Number: [id number] Date of Birth/Sex: 11-Jan-1927 (81 y.o. Female) Treating RN: Primary Care Physician: Viviana Simpler Other Clinician: Referring Physician: Viviana Simpler Treating Physician/Extender: Frann Rider in Treatment: 18 Constitutional . Pulse regular. Respirations normal and unlabored. Afebrile. . Eyes Nonicteric. Reactive to light. Ears, Nose, Mouth, and Throat Lips, teeth, and gums WNL.Marland Kitchen Moist mucosa without lesions . Neck supple and nontender. No palpable supraclavicular or cervical adenopathy. Normal sized without goiter. Respiratory WNL. No retractions.. Cardiovascular Pedal Pulses WNL. No clubbing, cyanosis or edema. Chest Breasts symmetical and no nipple discharge.. Breast tissue WNL, no masses, lumps, or  tenderness.. Integumentary (Hair, Skin) No suspicious lesions. the wounds have completely healed and the scar tissue supple and clean.. No crepitus or fluctuance. No peri-wound warmth or erythema. No masses.Marland Kitchen Psychiatric Judgement and insight Intact.. No evidence of depression, anxiety, or agitation.. Electronic Signature(s) Signed: 12/16/2014 4:38:19 PM By: Christin Fudge MD,  FACS Entered By: Christin Fudge on 12/16/2014 14:04:01 [name] ([id number]) -------------------------------------------------------------------------------- Physician Orders Details Patient Name: [name] Date of Service: 12/16/2014 1:45 PM Medical Record Number: [id number] Patient Account Number: [id number] Date of Birth/Sex: Dec 30, 1926 (81 y.o. Female) Treating RN: Montey Hora Primary Care Physician: Viviana Simpler Other Clinician: Referring Physician: Viviana Simpler Treating Physician/Extender: Frann Rider in Treatment: 80 Verbal / Phone Orders: Yes Clinician: Montey Hora Read Back and Verified: Yes Diagnosis Coding Discharge From [facility] Services o Discharge from [place] Completed Electronic Signature(s) Signed: 12/16/2014 2:01:16 PM By: Regan Lemming BSN, RN Signed: 12/16/2014 4:38:19 PM By: Christin Fudge MD, FACS Signed: 12/16/2014 5:12:42 PM By: Montey Hora Entered By: Montey Hora on 12/16/2014 14:01:15 [name] ([id number]) -------------------------------------------------------------------------------- Problem List Details Patient Name: [name], [name]. Date of Service: 12/16/2014 1:45 PM Medical Record Number: [id number] Patient Account Number: [id number] Date of Birth/Sex: [birth year] (81 y.o. Female) Treating RN: Primary Care Physician: Viviana Simpler Other Clinician: Referring Physician: Viviana Simpler Treating Physician/Extender: Frann Rider in Treatment: 37 Active Problems ICD-10 Encounter Code Description Active  Date Diagnosis S01.01XA Laceration without foreign body of scalp, initial encounter 12/03/2014 Yes S01.81XA Laceration without foreign body of other part of head, 12/03/2014 Yes initial encounter Inactive Problems Resolved Problems Electronic Signature(s) Signed: 12/16/2014 4:38:19 PM By: Christin Fudge MD, FACS Entered By: Christin Fudge on 12/16/2014 14:02:54 [name] ([id number]) -------------------------------------------------------------------------------- Progress Note Details Patient Name: [name]. Date of Service: 12/16/2014 1:45 PM Medical Record Number: [id number] Patient Account Number: [id number] Date of Birth/Sex: 04/18/1927 (81 y.o. Female) Treating RN: Primary Care Physician: Viviana Simpler Other Clinician: Referring Physician: Viviana Simpler Treating Physician/Extender: Frann Rider in Treatment: 18 Subjective Chief Complaint Information obtained from Patient Patient presents to the wound care center for a consult due non healing wound. this pleasant 81 year old patient had a fall in her house 3 days ago and lacerated her scalp near the hairline on the right part of her forehead. History of Present Illness (HPI) The following HPI elements were documented for the patient's wound: Location: right upper forehead near her scalp and hairline Quality: Patient reports experiencing a dull pain to affected area(s). Severity: Patient states wound (s) are getting better. Duration: Patient has had the wound for 3 days prior to presenting for treatment Timing: Pain in wound is Intermittent (comes and goes Context: she tripped over something and fell and hit her head against the hard object. Modifying Factors: not had any loss of consciousness, vision ranges or any persistent headaches. Associated Signs and Symptoms: no nausea vomiting Pleasant 81 year old patient who is on Eliquist chronic atrial fibrillation was in her home when she tripped and fell and  hit her head against a hard object 3 days ago. She did not seek any medical help nausea gone to see anyone. She kept this area covered and has not had any x-rays. The patient has not had any loss of consciousness, double vision, change in visit vision, headaches or nausea or vomiting. She had the wound covered and has not taken any other medications except her usual medications. no referral notes no is there any history of her past medical history her past surgical history. 12/09/2014 --she has had no symptoms of any closed head injury and has seen her PCP for her appropriate management. She seems to be doing fine. Objective Constitutional Pulse regular. Respirations normal and unlabored. Afebrile. LUCINA, BETTY ([id number]) Vitals Time Taken: 1:50 PM, Height: 62 in, Weight: 113.7 lbs,  BMI: 20.8, Temperature: 97.6 F, Pulse: 66 bpm, Respiratory Rate: 16 breaths/min, Blood Pressure: 117/67 mmHg. Eyes Nonicteric. Reactive to light. Ears, Nose, Mouth, and Throat Lips, teeth, and gums WNL.Marland Kitchen Moist mucosa without lesions . Neck supple and nontender. No palpable supraclavicular or cervical adenopathy. Normal sized without goiter. Respiratory WNL. No retractions.. Cardiovascular Pedal Pulses WNL. No clubbing, cyanosis or edema. Chest Breasts symmetical and no nipple discharge.. Breast tissue WNL, no masses, lumps, or tenderness.Marland Kitchen Psychiatric Judgement and insight Intact.. No evidence of depression, anxiety, or agitation.. Integumentary (Hair, Skin) No suspicious lesions. the wounds have completely healed and the scar tissue supple and clean.. No crepitus or fluctuance. No peri-wound warmth or erythema. No masses.. Wound #5 status is Healed - Epithelialized. Original cause of wound was Trauma. The wound is located on the Right Head - frontal. The wound measures 0cm length x 0cm width x 0cm depth; 0cm^2 area and 0cm^3 volume. The wound is limited to skin breakdown. There is no tunneling or  undermining noted. There is a none present amount of drainage noted. The wound margin is flat and intact. There is no granulation within the wound bed. There is no necrotic tissue within the wound bed. The periwound skin appearance did not exhibit: Callus, Crepitus, Excoriation, Fluctuance, Friable, Induration, Localized Edema, Rash, Scarring, Dry/Scaly, Maceration, Moist, Atrophie Blanche, Cyanosis, Ecchymosis, Hemosiderin Staining, Mottled, Pallor, Rubor, Erythema. Periwound temperature was noted as No Abnormality. Assessment Active Problems ICD-10 S01.01XA - Laceration without foreign body of scalp, initial encounter S01.81XA - Laceration without foreign body of other part of head, initial encounter DAH[place], [place]. ([id number]) The patient has done very well, and the wounds have completely healed. There are no open wounds and the scar is supple. I have advised her regarding wound care and have discharged her from the wound care services. Plan Discharge From [facility] Services: Discharge from [place] Completed The patient has done very well, and the wounds have completely healed. There are no open wounds and the scar is supple. I have advised her regarding wound care and have discharged her from the wound care services. Electronic Signature(s) Signed: 12/16/2014 4:38:19 PM By: Christin Fudge MD, FACS Entered By: Christin Fudge on 12/16/2014 14:05:07 [name] ([id number]) -------------------------------------------------------------------------------- [place] Details Patient Name: [name] Date of Service: 12/16/2014 Medical Record Number: [id number] Patient Account Number: [id number] Date of Birth/Sex: 1926/11/04 (81 y.o. Female) Treating RN: Primary Care Physician: Viviana Simpler Other Clinician: Referring Physician: Viviana Simpler Treating Physician/Extender: Frann Rider in Treatment: 18 Diagnosis Coding ICD-10 Codes Code  Description S01.01XA Laceration without foreign body of scalp, initial encounter S01.81XA Laceration without foreign body of other part of head, initial encounter Facility Procedures CPT4 Code: [id number] Description: [phone] - WOUND CARE VISIT-LEV 2 EST PT Modifier: Quantity: 1 Physician Procedures CPT4 Code Description: [id number] - WC PHYS LEVEL 3 - EST PT ICD-10 Description Diagnosis S01.01XA Laceration without foreign body of scalp, initial en S01.81XA Laceration without foreign body of other part of hea Modifier: counter d, initial enc Quantity: 1 Therapist, nutritional) Signed: 12/16/2014 4:38:19 PM By: Christin Fudge MD, FACS Entered By: Christin Fudge on 12/16/2014 14:05:22

## 2014-12-17 NOTE — Progress Notes (Signed)
[name], [name] ([id number]) Visit Report for 12/16/2014 Arrival Information Details Patient Name: [name], [name]. Date of Service: 12/16/2014 1:45 PM Medical Record Number: [id number] Patient Account Number: [id number] Date of Birth/Sex: [birth year] (81 y.o. Female) Treating RN: Montey Hora Primary Care Physician: Viviana Simpler Other Clinician: Referring Physician: Viviana Simpler Treating Physician/Extender: Frann Rider in Treatment: 18 Visit Information History Since Last Visit Any new allergies or adverse reactions: No Patient Arrived: Ambulatory Had a fall or experienced change in No Arrival Time: 13:52 activities of daily living that may affect Accompanied By: self risk of falls: Transfer Assistance: None Signs or symptoms of abuse/neglect since last No Patient Identification Verified: Yes visito Secondary Verification Process Yes Hospitalized since last visit: No Completed: Has Dressing in Place as Prescribed: Yes Patient Has Alerts: Yes Pain Present Now: No Patient Alerts: Patient on Blood Thinner Electronic Signature(s) Signed: 12/16/2014 5:12:42 PM By: Montey Hora Entered By: Montey Hora on 12/16/2014 13:53:14 [name], [name] ([id number]) -------------------------------------------------------------------------------- Clinic Level of Care Assessment Details Patient Name: [name] Date of Service: 12/16/2014 1:45 PM Medical Record Number: [id number] Patient Account Number: [id number] Date of Birth/Sex: Mar 07, 1927 (81 y.o. Female) Treating RN: Montey Hora Primary Care Physician: Viviana Simpler Other Clinician: Referring Physician: Viviana Simpler Treating Physician/Extender: Frann Rider in Treatment: 18 Clinic Level of Care Assessment Items TOOL 4 Quantity Score []  - Use when only an EandM is performed on FOLLOW-UP visit 0 ASSESSMENTS - Nursing Assessment / Reassessment X - Reassessment of Co-morbidities (includes updates  in patient status) 1 10 X - Reassessment of Adherence to Treatment Plan 1 5 ASSESSMENTS - Wound and Skin Assessment / Reassessment []  - Simple Wound Assessment / Reassessment - one wound 0 []  - Complex Wound Assessment / Reassessment - multiple wounds 0 []  - Dermatologic / Skin Assessment (not related to wound area) 0 ASSESSMENTS - Focused Assessment []  - Circumferential Edema Measurements - multi extremities 0 []  - Nutritional Assessment / Counseling / Intervention 0 []  - Lower Extremity Assessment (monofilament, tuning fork, pulses) 0 []  - Peripheral Arterial Disease Assessment (using hand held doppler) 0 ASSESSMENTS - Ostomy and/or Continence Assessment and Care []  - Incontinence Assessment and Management 0 []  - Ostomy Care Assessment and Management (repouching, etc.) 0 PROCESS - Coordination of Care X - Simple Patient / Family Education for ongoing care 1 15 []  - Complex (extensive) Patient / Family Education for ongoing care 0 []  - Staff obtains Programmer, systems, Records, Test Results / Process Orders 0 []  - Staff telephones HHA, Nursing Homes / Clarify orders / etc 0 []  - Routine Transfer to another Facility (non-emergent condition) 0 [name], [name] ([id number]) []  - Routine Hospital Admission (non-emergent condition) 0 []  - New Admissions / Biomedical engineer / Ordering NPWT, Apligraf, etc. 0 []  - Emergency Hospital Admission (emergent condition) 0 X - Simple Discharge Coordination 1 10 []  - Complex (extensive) Discharge Coordination 0 PROCESS - Special Needs []  - Pediatric / Minor Patient Management 0 []  - Isolation Patient Management 0 []  - Hearing / Language / Visual special needs 0 []  - Assessment of Community assistance (transportation, D/C planning, etc.) 0 []  - Additional assistance / Altered mentation 0 []  - Support Surface(s) Assessment (bed, cushion, seat, etc.) 0 INTERVENTIONS - Wound Cleansing / Measurement []  - Simple Wound Cleansing - one wound 0 []  - Complex  Wound Cleansing - multiple wounds 0 X - Wound Imaging (photographs - any number of wounds) 1 5 []  - Wound Tracing (instead of photographs) 0 []  -  Simple Wound Measurement - one wound 0 []  - Complex Wound Measurement - multiple wounds 0 INTERVENTIONS - Wound Dressings []  - Small Wound Dressing one or multiple wounds 0 []  - Medium Wound Dressing one or multiple wounds 0 []  - Large Wound Dressing one or multiple wounds 0 []  - Application of Medications - topical 0 []  - Application of Medications - injection 0 INTERVENTIONS - Miscellaneous []  - External ear exam 0 [name], [name] ([id number]) []  - Specimen Collection (cultures, biopsies, blood, body fluids, etc.) 0 []  - Specimen(s) / Culture(s) sent or taken to Lab for analysis 0 []  - Patient Transfer (multiple staff / Harrel Lemon Lift / Similar devices) 0 []  - Simple Staple / Suture removal (25 or less) 0 []  - Complex Staple / Suture removal (26 or more) 0 []  - Hypo / Hyperglycemic Management (close monitor of Blood Glucose) 0 []  - Ankle / Brachial Index (ABI) - do not check if billed separately 0 X - Vital Signs 1 5 Has the patient been seen at the hospital within the last three years: Yes Total Score: 50 Level Of Care: New/Established - Level 2 Electronic Signature(s) Signed: 12/16/2014 3:19:52 PM By: Regan Lemming BSN, RN Entered By: Regan Lemming on 12/16/2014 14:10:25 [name] ([id number]) -------------------------------------------------------------------------------- Encounter Discharge Information Details Patient Name: [name]. Date of Service: 12/16/2014 1:45 PM Medical Record Number: [id number] Patient Account Number: [id number] Date of Birth/Sex: 12/15/1926 (81 y.o. Female) Treating RN: Primary Care Physician: Viviana Simpler Other Clinician: Referring Physician: Viviana Simpler Treating Physician/Extender: Frann Rider in Treatment: 7 Encounter Discharge Information Items Discharge Pain Level:  0 Discharge Condition: Stable Ambulatory Status: Ambulatory Discharge Destination: Home Transportation: Private Auto Accompanied By: self Schedule Follow-up Appointment: No Medication Reconciliation completed and provided to Patient/Care No Mikia Delaluz: Provided on Clinical Summary of Care: 12/16/2014 Form Type Recipient Paper Patient JD Electronic Signature(s) Signed: 12/16/2014 5:40:15 PM By: Gretta Cool RN, BSN, Kim RN, BSN Previous Signature: 12/16/2014 2:07:41 PM Version By: Ruthine Dose Entered By: Gretta Cool RN, BSN, Kim on 12/16/2014 17:09:27 [name] ([id number]) -------------------------------------------------------------------------------- Lower Extremity Assessment Details Patient Name: [name], [name]. Date of Service: 12/16/2014 1:45 PM Medical Record Number: [id number] Patient Account Number: [id number] Date of Birth/Sex: Aug 21, 1926 (81 y.o. Female) Treating RN: Montey Hora Primary Care Physician: Viviana Simpler Other Clinician: Referring Physician: Viviana Simpler Treating Physician/Extender: Frann Rider in Treatment: 18 Electronic Signature(s) Signed: 12/16/2014 5:12:42 PM By: Montey Hora Entered By: Montey Hora on 12/16/2014 13:56:13 [name], [name] ([id number]) -------------------------------------------------------------------------------- Multi Wound Chart Details Patient Name: [name], [name]. Date of Service: 12/16/2014 1:45 PM Medical Record Number: [id number] Patient Account Number: [id number] Date of Birth/Sex: 1926-10-28 (81 y.o. Female) Treating RN: Montey Hora Primary Care Physician: Viviana Simpler Other Clinician: Referring Physician: Viviana Simpler Treating Physician/Extender: Frann Rider in Treatment: 18 Vital Signs Height(in): 62 Pulse(bpm): 66 Weight(lbs): 113.7 Blood Pressure 117/67 (mmHg): Body Mass Index(BMI): 21 Temperature(F): 97.6 Respiratory Rate 16 (breaths/min): Photos: [5:No Photos]  [N/A:N/A] Wound Location: [5:Right Head - frontal] [N/A:N/A] Wounding Event: [5:Trauma] [N/A:N/A] Primary Etiology: [5:Trauma, Other] [N/A:N/A] Comorbid History: [5:Cataracts, Anemia, Arrhythmia, Rheumatoid Arthritis, Osteoarthritis, Received Chemotherapy] [N/A:N/A] Date Acquired: [5:12/01/2014] [N/A:N/A] Weeks of Treatment: [5:1] [N/A:N/A] Wound Status: [5:Healed - Epithelialized] [N/A:N/A] Measurements L x W x D 0x0x0 [N/A:N/A] (cm) Area (cm) : [5:0] [N/A:N/A] Volume (cm) : [5:0] [N/A:N/A] % Reduction in Area: [5:100.00%] [N/A:N/A] % Reduction in Volume: 100.00% [N/A:N/A] Classification: [5:Partial Thickness] [N/A:N/A] Exudate Amount: [5:None Present] [N/A:N/A] Wound Margin: [5:Flat and Intact] [N/A:N/A] Granulation Amount: [5:None  Present (0%)] [N/A:N/A] Necrotic Amount: [5:None Present (0%)] [N/A:N/A] Exposed Structures: [5:Fascia: No Fat: No Tendon: No Muscle: No Joint: No Bone: No Limited to Skin Breakdown] [N/A:N/A] Epithelialization: Large (67-100%) N/A N/A Periwound Skin Texture: Edema: No N/A N/A Excoriation: No Induration: No Callus: No Crepitus: No Fluctuance: No Friable: No Rash: No Scarring: No Periwound Skin Maceration: No N/A N/A Moisture: Moist: No Dry/Scaly: No Periwound Skin Color: Atrophie Blanche: No N/A N/A Cyanosis: No Ecchymosis: No Erythema: No Hemosiderin Staining: No Mottled: No Pallor: No Rubor: No Temperature: No Abnormality N/A N/A Tenderness on No N/A N/A Palpation: Wound Preparation: Ulcer Cleansing: N/A N/A Rinsed/Irrigated with Saline Treatment Notes Electronic Signature(s) Signed: 12/16/2014 5:12:42 PM By: Montey Hora Entered By: Montey Hora on 12/16/2014 14:00:44 [name] ([id number]) -------------------------------------------------------------------------------- Clare Details Patient Name: [name]. Date of Service: 12/16/2014 1:45 PM Medical Record Number: [id number] Patient  Account Number: [id number] Date of Birth/Sex: 14-Nov-1926 (81 y.o. Female) Treating RN: Montey Hora Primary Care Physician: Viviana Simpler Other Clinician: Referring Physician: Viviana Simpler Treating Physician/Extender: Frann Rider in Treatment: 18 Active Inactive Electronic Signature(s) Signed: 12/16/2014 5:12:42 PM By: Montey Hora Entered By: Montey Hora on 12/16/2014 14:00:33 [name], [name] ([id number]) -------------------------------------------------------------------------------- Pain Assessment Details Patient Name: [name]. Date of Service: 12/16/2014 1:45 PM Medical Record Number: [id number] Patient Account Number: [id number] Date of Birth/Sex: 01/13/1927 (81 y.o. Female) Treating RN: Montey Hora Primary Care Physician: Viviana Simpler Other Clinician: Referring Physician: Viviana Simpler Treating Physician/Extender: Frann Rider in Treatment: 2 Active Problems Location of Pain Severity and Description of Pain Patient Has Paino No Site Locations Pain Management and Medication Current Pain Management: Electronic Signature(s) Signed: 12/16/2014 5:12:42 PM By: Montey Hora Entered By: Montey Hora on 12/16/2014 13:53:20 [name], [name] ([id number]) -------------------------------------------------------------------------------- Patient/Caregiver Education Details Patient Name: [name]. Date of Service: 12/16/2014 1:45 PM Medical Record Number: [id number] Patient Account Number: [id number] Date of Birth/Gender: 10/20/1926 (81 y.o. Female) Treating RN: Baruch Gouty, RN, BSN, Velva Harman Primary Care Physician: Viviana Simpler Other Clinician: Referring Physician: Viviana Simpler Treating Physician/Extender: Frann Rider in Treatment: 77 Education Assessment Education Provided To: Patient Education Topics Provided Wound/Skin Impairment: Handouts: Other: discharged Responses: State content correctly Electronic  Signature(s) Signed: 12/16/2014 3:19:52 PM By: Regan Lemming BSN, RN Entered By: Regan Lemming on 12/16/2014 14:11:07 [name] ([id number]) -------------------------------------------------------------------------------- Wound Assessment Details Patient Name: [name] Date of Service: 12/16/2014 1:45 PM Medical Record Number: [id number] Patient Account Number: [id number] Date of Birth/Sex: 08-04-1926 (81 y.o. Female) Treating RN: Montey Hora Primary Care Physician: Viviana Simpler Other Clinician: Referring Physician: Viviana Simpler Treating Physician/Extender: Frann Rider in Treatment: 18 Wound Status Wound Number: 5 Primary Trauma, Other Etiology: Wound Location: Right Head - frontal Wound Healed - Epithelialized Wounding Event: Trauma Status: Date Acquired: 12/01/2014 Comorbid Cataracts, Anemia, Arrhythmia, Weeks Of Treatment: 1 History: Rheumatoid Arthritis, Osteoarthritis, Clustered Wound: No Received Chemotherapy Photos Photo Uploaded By: Regan Lemming on 12/16/2014 15:15:25 Wound Measurements Length: (cm) 0 % Reduction i Width: (cm) 0 % Reduction i Depth: (cm) 0 Epithelializa Area: (cm) 0 Tunneling: Volume: (cm) 0 Undermining: n Area: 100% n Volume: 100% tion: Large (67-100%) No No Wound Description Classification: Partial Thickness Foul Odor Aft Wound Margin: Flat and Intact Exudate Amount: None Present er Cleansing: No Wound Bed Granulation Amount: None Present (0%) Exposed Structure Necrotic Amount: None Present (0%) Fascia Exposed: No Fat Layer Exposed: No Tendon Exposed: No Muscle Exposed: No Joint Exposed: No [name], [name] ([id number]) Bone Exposed: No  Limited to Skin Breakdown Periwound Skin Texture Texture Color No Abnormalities Noted: No No Abnormalities Noted: No Callus: No Atrophie Blanche: No Crepitus: No Cyanosis: No Excoriation: No Ecchymosis: No Fluctuance: No Erythema: No Friable: No Hemosiderin  Staining: No Induration: No Mottled: No Localized Edema: No Pallor: No Rash: No Rubor: No Scarring: No Temperature / Pain Moisture Temperature: No Abnormality No Abnormalities Noted: No Dry / Scaly: No Maceration: No Moist: No Wound Preparation Ulcer Cleansing: Rinsed/Irrigated with Saline Electronic Signature(s) Signed: 12/16/2014 5:12:42 PM By: Montey Hora Entered By: Montey Hora on 12/16/2014 13:57:09 [name], [name] ([id number]) -------------------------------------------------------------------------------- Vitals Details Patient Name: [name]. Date of Service: 12/16/2014 1:45 PM Medical Record Number: [id number] Patient Account Number: [id number] Date of Birth/Sex: [birth year] (81 y.o. Female) Treating RN: Montey Hora Primary Care Physician: Viviana Simpler Other Clinician: Referring Physician: Viviana Simpler Treating Physician/Extender: Frann Rider in Treatment: 18 Vital Signs Time Taken: 13:50 Temperature (F): 97.6 Height (in): 62 Pulse (bpm): 66 Weight (lbs): 113.7 Respiratory Rate (breaths/min): 16 Body Mass Index (BMI): 20.8 Blood Pressure (mmHg): 117/67 Reference Range: 80 - 120 mg / dl Electronic Signature(s) Signed: 12/16/2014 5:12:42 PM By: Montey Hora Entered By: Montey Hora on 12/16/2014 13:53:43

## 2014-12-23 ENCOUNTER — Ambulatory Visit: Payer: Medicare Other | Admitting: Internal Medicine

## 2014-12-26 ENCOUNTER — Encounter: Payer: Self-pay | Admitting: Internal Medicine

## 2014-12-26 ENCOUNTER — Ambulatory Visit (INDEPENDENT_AMBULATORY_CARE_PROVIDER_SITE_OTHER): Payer: Medicare Other | Admitting: Internal Medicine

## 2014-12-26 VITALS — BP 108/60 | HR 80 | Temp 98.6°F | Wt 114.0 lb

## 2014-12-26 DIAGNOSIS — M069 Rheumatoid arthritis, unspecified: Secondary | ICD-10-CM | POA: Diagnosis not present

## 2014-12-26 DIAGNOSIS — S0091XD Abrasion of unspecified part of head, subsequent encounter: Secondary | ICD-10-CM | POA: Diagnosis not present

## 2014-12-26 DIAGNOSIS — D500 Iron deficiency anemia secondary to blood loss (chronic): Secondary | ICD-10-CM | POA: Diagnosis not present

## 2014-12-26 LAB — POCT HEMOGLOBIN: HEMOGLOBIN: 9.5 g/dL — AB (ref 12.2–16.2)

## 2014-12-26 NOTE — Progress Notes (Signed)
Pre visit review using our clinic review tool, if applicable. No additional management support is needed unless otherwise documented below in the visit note. 

## 2014-12-26 NOTE — Assessment & Plan Note (Signed)
MTX decreased but I doubt that caused the anemia Doing okay thus far with her arthritis

## 2014-12-26 NOTE — Assessment & Plan Note (Signed)
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

## 2014-12-26 NOTE — Progress Notes (Signed)
Subjective:    Patient ID: [name], female    DOB: [birth year], 81 y.o.   MRN: [id number]  HPI Here for follow up of anemia and head wound  Right forehead is completely healed No problems with this  Appetite is her normal No black stools or change in stool Energy levels are a little better but not great Continues the iron with vitamin C  Current Outpatient Prescriptions on File Prior to Visit  Medication Sig Dispense Refill  . acetaminophen (TYLENOL) 650 MG CR tablet Take 650 mg by mouth every 8 (eight) hours as needed for pain.    Marland Kitchen apixaban (ELIQUIS) 2.5 MG TABS tablet Take 1 tablet (2.5 mg total) by mouth 2 (two) times daily. 180 tablet 4  . atenolol (TENORMIN) 25 MG tablet TAKE ONE TABLET BY MOUTH ONCE DAILY 90 tablet 3  . Biotin 5000 MCG CAPS Take by mouth daily.      Marland Kitchen CALCIUM CARBONATE PO Take 1,200 mg by mouth daily.    . CHONDROITIN SULFATE PO Take 1,200 mg by mouth daily.    . clotrimazole-betamethasone (LOTRISONE) cream Apply 1 application topically 2 (two) times daily. 30 g 0  . cyanocobalamin 2000 MCG tablet Take 2,000 mcg by mouth daily.    [name] Sodium (COLACE PO) Take by mouth as needed.    . folic acid (FOLVITE) 1 MG tablet Take 2 mg by mouth daily.     . furosemide (LASIX) 40 MG tablet TAKE ONE TABLET BY MOUTH ONCE DAILY AS NEEDED 90 tablet 3  . Glucosamine HCl (GLUCOSAMINE PO) Take 1,500 mg by mouth daily.    . hydroxychloroquine (PLAQUENIL) 200 MG tablet Take 400 mg by mouth daily.     Marland Kitchen levothyroxine (SYNTHROID, LEVOTHROID) 75 MCG tablet TAKE ONE TABLET BY MOUTH ONCE DAILY BEFORE BREAKFAST 90 tablet 3  . magnesium hydroxide (MILK OF MAGNESIA) 400 MG/5ML suspension Take by mouth daily as needed for mild constipation.    . methocarbamol (ROBAXIN) 500 MG tablet Take 500 mg by mouth as needed for muscle spasms.    . methotrexate (RHEUMATREX) 2.5 MG tablet Take 5 tablets on Wednesday.    . Multiple Vitamins-Minerals (ONE-A-DAY 50 PLUS PO) Take by mouth  daily.      . mupirocin ointment (BACTROBAN) 2 % Apply 1 application topically 3 (three) times daily.    Marland Kitchen omeprazole (PRILOSEC) 40 MG capsule Take 40 mg by mouth daily.    . OxyCODONE (OXYCONTIN) 20 mg T12A 12 hr tablet Take 20 mg by mouth every 12 (twelve) hours.     Marland Kitchen oxyCODONE (ROXICODONE) 15 MG immediate release tablet Take 15 mg by mouth every 4 (four) hours as needed for pain.    . Polyethylene Glycol 3350 (MIRALAX PO) Take by mouth as needed.     No current facility-administered medications on file prior to visit.    Allergies  Allergen Reactions  . Dabigatran Etexilate Mesylate Shortness Of Breath  . Coumadin [Warfarin Sodium] Other (See Comments)    hematoma  . Doxycycline Hyclate [Doxycycline] Itching  . Penicillins   . Pradaxa [Dabigatran Etexilate Mesylate] Other (See Comments)    SLEEP  . Sulfonamide Derivatives   . Tape Other (See Comments)    blisters  . Tramadol Hcl     Itching     Past Medical History  Diagnosis Date  . Arthritis   . Hyperlipidemia   . Hypothyroidism   . Atrial fibrillation     a. s/p prior DCCV's, now persistent-->Xarelto (  CHA2DS2VASc = 3).  . Diverticulosis     a. by colonoscopy, h/o colon adenoma ([facility])  . Gastritis and duodenitis     a. 01/2014 EGD: Moderate non-erosive gastritis and mild non-erosive duodenitis.    Past Surgical History  Procedure Laterality Date  . Rotator cuff repair    . Intraocular lens implant, secondary  1996/ 2010  . Knee surgery      lateral  . Tonsillectomy    . Colonoscopy  02/2011    mod diverticulosis ([facility])  . Total knee arthroplasty Bilateral     Family History  Problem Relation Age of Onset  . Heart disease Mother     History   Social History  . Marital Status: Married    Spouse Name: N/A  . Number of Children: N/A  . Years of Education: N/A   Occupational History  . retired    Social History Main Topics  . Smoking status: Former Smoker    Types: Cigarettes  . Smokeless  tobacco: Never Used  . Alcohol Use: Yes  . Drug Use: No  . Sexual Activity: Not on file   Other Topics Concern  . Not on file   Social History Narrative   Regular exercise   Remains active with church and community activities      Has living will   Husband, then Barton Fanny (friend), is health care POA   Has DNR order---form done again 05/15/12   No tube feeds if cognitively unaware   Review of Systems Sleeps okay Still has stress with husband    Objective:   Physical Exam  Constitutional: She appears well-developed.  Neck: Normal range of motion. Neck supple. No thyromegaly present.  Cardiovascular: Normal rate, regular rhythm and normal heart sounds.  Exam reveals no gallop.   No murmur heard. Pulmonary/Chest: Effort normal and breath sounds normal. No respiratory distress. She has no wheezes. She has no rales.  Abdominal: Soft. There is no tenderness.  Lymphadenopathy:    She has no cervical adenopathy.  Psychiatric: She has a normal mood and affect. Her behavior is normal.          Assessment & Plan:

## 2014-12-26 NOTE — Assessment & Plan Note (Signed)
Healed completely No action needed

## 2014-12-27 ENCOUNTER — Encounter: Payer: Self-pay | Admitting: Internal Medicine

## 2014-12-28 ENCOUNTER — Telehealth: Payer: Self-pay | Admitting: Internal Medicine

## 2014-12-28 DIAGNOSIS — D500 Iron deficiency anemia secondary to blood loss (chronic): Secondary | ICD-10-CM

## 2014-12-28 NOTE — Telephone Encounter (Signed)
email discussion about her disappointment about hemoglobin not being higher Will set up repeat in a month  [name], Please set up appt I will enter order

## 2014-12-30 NOTE — Telephone Encounter (Signed)
Lab appt scheduled.

## 2015-01-01 ENCOUNTER — Other Ambulatory Visit (INDEPENDENT_AMBULATORY_CARE_PROVIDER_SITE_OTHER): Payer: Medicare Other

## 2015-01-01 DIAGNOSIS — D500 Iron deficiency anemia secondary to blood loss (chronic): Secondary | ICD-10-CM

## 2015-01-01 LAB — CBC WITH DIFFERENTIAL/PLATELET
BASOS ABS: 0 10*3/uL (ref 0.0–0.1)
BASOS PCT: 0.2 % (ref 0.0–3.0)
Eosinophils Absolute: 0 10*3/uL (ref 0.0–0.7)
Eosinophils Relative: 0 % (ref 0.0–5.0)
HCT: 30.7 % — ABNORMAL LOW (ref 36.0–46.0)
Hemoglobin: 10 g/dL — ABNORMAL LOW (ref 12.0–15.0)
Lymphocytes Relative: 15.5 % (ref 12.0–46.0)
Lymphs Abs: 0.9 10*3/uL (ref 0.7–4.0)
MCHC: 32.6 g/dL (ref 30.0–36.0)
MCV: 89.1 fl (ref 78.0–100.0)
MONOS PCT: 11.4 % (ref 3.0–12.0)
Monocytes Absolute: 0.6 10*3/uL (ref 0.1–1.0)
NEUTROS ABS: 4.1 10*3/uL (ref 1.4–7.7)
Neutrophils Relative %: 72.9 % (ref 43.0–77.0)
Platelets: 308 10*3/uL (ref 150.0–400.0)
RBC: 3.45 Mil/uL — AB (ref 3.87–5.11)
RDW: 19.1 % — ABNORMAL HIGH (ref 11.5–15.5)
WBC: 5.6 10*3/uL (ref 4.0–10.5)

## 2015-01-02 ENCOUNTER — Encounter: Payer: Self-pay | Admitting: Internal Medicine

## 2015-01-04 NOTE — Telephone Encounter (Signed)
Please send recent labs to Dr Calton Dach

## 2015-01-20 ENCOUNTER — Other Ambulatory Visit: Payer: Self-pay | Admitting: *Deleted

## 2015-01-20 MED ORDER — APIXABAN 2.5 MG PO TABS: 2.5000 mg | ORAL_TABLET | Freq: Two times a day (BID) | ORAL | Status: DC

## 2015-02-02 ENCOUNTER — Encounter: Payer: Self-pay | Admitting: Emergency Medicine

## 2015-02-02 ENCOUNTER — Emergency Department
Admission: EM | Admit: 2015-02-02 | Discharge: 2015-02-02 | Disposition: A | Discharge status: Home / Self Care | Payer: Medicare Other | Attending: Emergency Medicine | Admitting: Emergency Medicine

## 2015-02-02 DIAGNOSIS — S61431A Puncture wound without foreign body of right hand, initial encounter: Secondary | ICD-10-CM | POA: Insufficient documentation

## 2015-02-02 DIAGNOSIS — W540XXA Bitten by dog, initial encounter: Secondary | ICD-10-CM | POA: Diagnosis not present

## 2015-02-02 DIAGNOSIS — Z87891 Personal history of nicotine dependence: Secondary | ICD-10-CM | POA: Diagnosis not present

## 2015-02-02 DIAGNOSIS — S61451A Open bite of right hand, initial encounter: Secondary | ICD-10-CM

## 2015-02-02 DIAGNOSIS — Z79899 Other long term (current) drug therapy: Secondary | ICD-10-CM | POA: Insufficient documentation

## 2015-02-02 DIAGNOSIS — Z23 Encounter for immunization: Secondary | ICD-10-CM | POA: Insufficient documentation

## 2015-02-02 DIAGNOSIS — Z7901 Long term (current) use of anticoagulants: Secondary | ICD-10-CM | POA: Diagnosis not present

## 2015-02-02 DIAGNOSIS — S91351A Open bite, right foot, initial encounter: Secondary | ICD-10-CM

## 2015-02-02 DIAGNOSIS — N183 Chronic kidney disease, stage 3 (moderate): Secondary | ICD-10-CM | POA: Insufficient documentation

## 2015-02-02 DIAGNOSIS — S91331A Puncture wound without foreign body, right foot, initial encounter: Secondary | ICD-10-CM | POA: Insufficient documentation

## 2015-02-02 DIAGNOSIS — Y9389 Activity, other specified: Secondary | ICD-10-CM | POA: Insufficient documentation

## 2015-02-02 DIAGNOSIS — Y92009 Unspecified place in unspecified non-institutional (private) residence as the place of occurrence of the external cause: Secondary | ICD-10-CM | POA: Diagnosis not present

## 2015-02-02 DIAGNOSIS — Y998 Other external cause status: Secondary | ICD-10-CM | POA: Diagnosis not present

## 2015-02-02 MED ORDER — CLINDAMYCIN HCL 150 MG PO CAPS
300.0000 mg | ORAL_CAPSULE | Freq: Once | ORAL | Status: AC
  Administered 2015-02-02: 300 mg via ORAL
  Filled 2015-02-02: qty 2

## 2015-02-02 MED ORDER — DOXYCYCLINE HYCLATE 100 MG PO CAPS: 100.0000 mg | ORAL_CAPSULE | Freq: Two times a day (BID) | ORAL | Status: DC

## 2015-02-02 MED ORDER — DOXYCYCLINE HYCLATE 100 MG PO TABS
100.0000 mg | ORAL_TABLET | Freq: Once | ORAL | Status: AC
  Administered 2015-02-02: 100 mg via ORAL
  Filled 2015-02-02: qty 1

## 2015-02-02 MED ORDER — RABIES VACCINE, PCEC IM SUSR
INTRAMUSCULAR | Status: AC
  Administered 2015-02-02: 1 mL via INTRAMUSCULAR
  Filled 2015-02-02: qty 1

## 2015-02-02 MED ORDER — RABIES IMMUNE GLOBULIN 150 UNIT/ML IM INJ
20.0000 [IU]/kg | INJECTION | Freq: Once | INTRAMUSCULAR | Status: AC
  Administered 2015-02-02: 1050 [IU] via INTRAMUSCULAR
  Filled 2015-02-02: qty 8

## 2015-02-02 MED ORDER — RABIES VACCINE, PCEC IM SUSR
1.0000 mL | Freq: Once | INTRAMUSCULAR | Status: AC
  Administered 2015-02-02: 1 mL via INTRAMUSCULAR

## 2015-02-02 MED ORDER — CLINDAMYCIN HCL 300 MG PO CAPS: 300.0000 mg | ORAL_CAPSULE | Freq: Three times a day (TID) | ORAL | Status: DC

## 2015-02-02 NOTE — ED Notes (Signed)
Pt in no acute distress after administration of medications.

## 2015-02-02 NOTE — Discharge Instructions (Signed)
Animal Bite °An animal bite can result in a scratch on the skin, deep open cut, puncture of the skin, crush injury, or tearing away of the skin or a body part. Dogs are responsible for most animal bites. Children are bitten more often than adults. An animal bite can range from very mild to more serious. A small bite from your house pet is no cause for alarm. However, some animal bites can become infected or injure a bone or other tissue. You must seek medical care if: °· The skin is broken and bleeding does not slow down or stop after 15 minutes. °· The puncture is deep and difficult to clean (such as a cat bite). °· Pain, warmth, redness, or pus develops around the wound. °· The bite is from a stray animal or rodent. There may be a risk of rabies infection. °· The bite is from a snake, raccoon, skunk, fox, coyote, or bat. There may be a risk of rabies infection. °· The person bitten has a chronic illness such as diabetes, liver disease, or cancer, or the person takes medicine that lowers the immune system. °· There is concern about the location and severity of the bite. °It is important to clean and protect an animal bite wound right away to prevent infection. Follow these steps: °· Clean the wound with plenty of water and soap. °· Apply an antibiotic cream. °· Apply gentle pressure over the wound with a clean towel or gauze to slow or stop bleeding. °· Elevate the affected area above the heart to help stop any bleeding. °· Seek medical care. Getting medical care within 8 hours of the animal bite leads to the best possible outcome. °DIAGNOSIS  °Your caregiver will most likely: °· Take a detailed history of the animal and the bite injury. °· Perform a wound exam. °· Take your medical history. °Blood tests or X-rays may be performed. Sometimes, infected bite wounds are cultured and sent to a lab to identify the infectious bacteria.  °TREATMENT  °Medical treatment will depend on the location and type of animal bite as  well as the patient's medical history. Treatment may include: °· Wound care, such as cleaning and flushing the wound with saline solution, bandaging, and elevating the affected area. °· Antibiotics. °· Tetanus immunization. °· Rabies immunization. °· Leaving the wound open to heal. This is often done with animal bites, due to the high risk of infection. However, in certain cases, wound closure with stitches, wound adhesive, skin adhesive strips, or staples may be used. ° Infected bites that are left untreated may require intravenous (IV) antibiotics and surgical treatment in the hospital. °HOME CARE INSTRUCTIONS °· Follow your caregiver's instructions for wound care. °· Take all medicines as directed. °· If your caregiver prescribes antibiotics, take them as directed. Finish them even if you start to feel better. °· Follow up with your caregiver for further exams or immunizations as directed. °You may need a tetanus shot if: °· You cannot remember when you had your last tetanus shot. °· You have never had a tetanus shot. °· The injury broke your skin. °If you get a tetanus shot, your arm may swell, get red, and feel warm to the touch. This is common and not a problem. If you need a tetanus shot and you choose not to have one, there is a rare chance of getting tetanus. Sickness from tetanus can be serious. °SEEK MEDICAL CARE IF: °· You notice warmth, redness, soreness, swelling, pus discharge, or a bad   smell coming from the wound.  You have a red line on the skin coming from the wound.  You have a fever, chills, or a general ill feeling.  You have nausea or vomiting.  You have continued or worsening pain.  You have trouble moving the injured part.  You have other questions or concerns. MAKE SURE YOU:  Understand these instructions.  Will watch your condition.  Will get help right away if you are not doing well or get worse. Document Released: 03/09/2011 Document Revised: 09/13/2011 Document  Reviewed: 03/09/2011 [facility] Patient Information 2015 [place], [place]. This information is not intended to replace advice given to you by your health care provider. Make sure you discuss any questions you have with your health care provider.  Rabies  Rabies is a viral infection that can be spread to people from infected animals. The infection affects the brain and central nervous system. Once the disease develops, it almost always causes death. Because of this, when a person is bitten by an animal that may have rabies, treatment to prevent rabies often needs to be started whether or not the animal is known to be infected. Prompt treatment with the rabies vaccine and rabies immune globulin is very effective at preventing the infection from developing in people who have been exposed to the rabies virus. CAUSES  Rabies is caused by a virus that lives inside some animals. When a person is bitten by an infected animal, the rabies virus is spread to the person through the infected spit (saliva) of the animal. This virus can be carried by animals such as dogs, cats, skunks, bats, woodchucks, raccoons, coyotes, and foxes. SYMPTOMS  By the time symptoms appear, rabies is usually fatal for the person. Common symptoms include:  Headache.  Fever.  Fatigue and weakness.  Agitation.  Anxiety.  Confusion.  Unusual behavior, such as hyperactivity, fear of water (hydrophobia), or fear of air (aerophobia).  Hallucinations.  Insomnia.  Weakness in the arms or legs.  Difficulty swallowing. Most people get sick in 1-3 months after being bitten. This often varies and may depend on the location of the bite. The infection will take less time to develop if the bite occurred closer to the head.  DIAGNOSIS  To determine if a person is infected, several tests must be performed, such as:  A skin biopsy.  A saliva test.  A lumbar puncture to remove spinal fluid so it can be examined.  Blood  tests. TREATMENT  Treatment to prevent the infection from developing (post-exposure prophylaxis, PEP) is often started before knowing for sure if the person has been exposed to the rabies virus. PEP involves cleaning the wound, giving an antibody injection (rabies immune globulin), and giving a series of rabies vaccine injections. The series of injections are usually given over a two-week period. If possible, the animal that bit the person will be observed to see if it remains healthy. If the animal has been killed, it can be sent to a state laboratory and examined to see if the animal had rabies. If a person is bitten by a domestic animal (dog, cat, or ferret) that appears healthy and can be observed to see if it remains healthy, often no further treatment is necessary other than care of the wounds caused by the animal. Rabies is often a fatal illness once the infection develops in a person. Although a few people who developed rabies have survived after experimental treatment with certain drugs, all these survivors still had severe nervous  system problems after the treatment. This is why caregivers use extra caution and begin PEP treatment for people who have been bitten by animals that are possibly infected with rabies.  HOME CARE INSTRUCTIONS  If you were bitten by an unknown animal, make sure you know your caregiver's instructions for follow-up. If the animal was sent to a laboratory for examination, ask when the test results will be ready. Make sure you get the test results.  Take these steps to care for your wound:  Keep the wound clean, dry, and dressed as directed by your caregiver.  Keep the injured part elevated as much as possible.  Do not resume use of the affected area until directed.  Only take over-the-counter or prescription medicines as directed by your caregiver.  Keep all follow-up appointments as directed by your caregiver. PREVENTION  To prevent rabies, people need to reduce  their risk of having contact with infected animals.   Make sure your pets (dogs, cats, ferrets) are vaccinated against rabies. Keep these vaccinations up-to-date as directed by your veterinarian.  Supervise your pets when they are outside. Keep them away from wild animals.  Call your local animal control services to report any stray animals. These animals may not be vaccinated.  Stay away from stray or wild animals.  Consider getting the rabies vaccine (preexposure) if you are traveling to an area where rabies is common or if your job or activities involve possible contact with wild or stray animals. Discuss this with your caregiver. Document Released: 06/21/2005 Document Revised: 03/15/2012 Document Reviewed: 01/18/2012 [facility] Patient Information 2015 [place], [place]. This information is not intended to replace advice given to you by your health care provider. Make sure you discuss any questions you have with your health care provider.

## 2015-02-02 NOTE — ED Provider Notes (Signed)
[facility] Emergency Department Provider Note  ____________________________________________  Time seen: Approximately 6:18 PM  I have reviewed the triage vital signs and the nursing notes.   HISTORY  Chief Complaint Animal Bite    HPI [name] is a 81 y.o. female      Pt presents to the ER from home with complaints of animal bite to right hand, pt reports her dog brought a groundhog home, pt reports she was trying to place this animal in a bag and the groundhog was alive biting pt in the right hand and on the right foot, pt reports she called Animal control and BPO.           Past Medical History  Diagnosis Date  . Arthritis   . Hyperlipidemia   . Hypothyroidism   . Atrial fibrillation     a. s/p prior DCCV's, now persistent-->Xarelto (CHA2DS2VASc = 3).  . Diverticulosis     a. by colonoscopy, h/o colon adenoma ([facility])  . Gastritis and duodenitis     a. 01/2014 EGD: Moderate non-erosive gastritis and mild non-erosive duodenitis.    Patient Active Problem List   Diagnosis Date Noted  . Abrasion of head 12/04/2014  . Gastric AVM 11/25/2014  . Mild malnutrition 10/11/2014  . COPD (chronic obstructive pulmonary disease) with emphysema 10/11/2014  . Chronic kidney disease, stage III (moderate) 10/11/2014  . Rheumatoid arthritis 07/29/2014  . Stress due to illness of family member 01/11/2014  . Iron deficiency anemia due to chronic blood loss 01/11/2014  . Gastritis and duodenitis 01/11/2014  . Chronic venous insufficiency 02/02/2013  . Routine general medical examination at a health care facility 05/15/2012  . Other and unspecified hyperlipidemia 09/30/2010  . Osteoarthrosis involving more than one site 09/30/2010  . UNSPECIFIED HYPOTHYROIDISM 04/28/2009  . Atrial fibrillation 04/28/2009    Past Surgical History  Procedure Laterality Date  . Rotator cuff repair    . Intraocular lens implant, secondary  1996/ 2010  . Knee surgery       lateral  . Tonsillectomy    . Colonoscopy  02/2011    mod diverticulosis ([facility])  . Total knee arthroplasty Bilateral     Current Outpatient Rx  Name  Route  Sig  Dispense  Refill  . acetaminophen (TYLENOL) 650 MG CR tablet   Oral   Take 650 mg by mouth every 8 (eight) hours as needed for pain.         Marland Kitchen apixaban (ELIQUIS) 2.5 MG TABS tablet   Oral   Take 1 tablet (2.5 mg total) by mouth 2 (two) times daily.   60 tablet   3     Pt would like loose tablets.   Marland Kitchen atenolol (TENORMIN) 25 MG tablet      TAKE ONE TABLET BY MOUTH ONCE DAILY   90 tablet   3   . Biotin 5000 MCG CAPS   Oral   Take by mouth daily.           Marland Kitchen CALCIUM CARBONATE PO   Oral   Take 1,200 mg by mouth daily.         . CHONDROITIN SULFATE PO   Oral   Take 1,200 mg by mouth daily.         . clindamycin (CLEOCIN) 300 MG capsule   Oral   Take 1 capsule (300 mg total) by mouth 3 (three) times daily.   30 capsule   0   . clotrimazole-betamethasone (LOTRISONE) cream  Topical   Apply 1 application topically 2 (two) times daily.   30 g   0   . cyanocobalamin 2000 MCG tablet   Oral   Take 2,000 mcg by mouth daily.         [name] Sodium (COLACE PO)   Oral   Take by mouth as needed.         . doxycycline (VIBRAMYCIN) 100 MG capsule   Oral   Take 1 capsule (100 mg total) by mouth 2 (two) times daily.   20 capsule   0   . folic acid (FOLVITE) 1 MG tablet   Oral   Take 2 mg by mouth daily.          . furosemide (LASIX) 40 MG tablet      TAKE ONE TABLET BY MOUTH ONCE DAILY AS NEEDED   90 tablet   3   . Glucosamine HCl (GLUCOSAMINE PO)   Oral   Take 1,500 mg by mouth daily.         . hydroxychloroquine (PLAQUENIL) 200 MG tablet   Oral   Take 400 mg by mouth daily.          Marland Kitchen levothyroxine (SYNTHROID, LEVOTHROID) 75 MCG tablet      TAKE ONE TABLET BY MOUTH ONCE DAILY BEFORE BREAKFAST   90 tablet   3   . magnesium hydroxide (MILK OF MAGNESIA) 400 MG/5ML  suspension   Oral   Take by mouth daily as needed for mild constipation.         . methocarbamol (ROBAXIN) 500 MG tablet   Oral   Take 500 mg by mouth as needed for muscle spasms.         . methotrexate (RHEUMATREX) 2.5 MG tablet      Take 5 tablets on Wednesday.         . Multiple Vitamins-Minerals (ONE-A-DAY 50 PLUS PO)   Oral   Take by mouth daily.           . mupirocin ointment (BACTROBAN) 2 %   Topical   Apply 1 application topically 3 (three) times daily.         Marland Kitchen omeprazole (PRILOSEC) 40 MG capsule   Oral   Take 40 mg by mouth daily.         . OxyCODONE (OXYCONTIN) 20 mg T12A 12 hr tablet   Oral   Take 20 mg by mouth every 12 (twelve) hours.          Marland Kitchen oxyCODONE (ROXICODONE) 15 MG immediate release tablet   Oral   Take 15 mg by mouth every 4 (four) hours as needed for pain.         . Polyethylene Glycol 3350 (MIRALAX PO)   Oral   Take by mouth as needed.           Allergies Dabigatran etexilate mesylate; Coumadin; Doxycycline hyclate; Penicillins; Pradaxa; Sulfonamide derivatives; Tape; and Tramadol hcl  Family History  Problem Relation Age of Onset  . Heart disease Mother     Social History History  Substance Use Topics  . Smoking status: Former Smoker    Types: Cigarettes  . Smokeless tobacco: Never Used  . Alcohol Use: Yes    Review of Systems Constitutional: No fever/chills Eyes: No visual changes. ENT: No sore throat. Cardiovascular: Denies chest pain. Respiratory: Denies shortness of breath. Gastrointestinal: No abdominal pain.  No nausea, no vomiting.  No diarrhea.  No constipation. Genitourinary: Negative for dysuria. Musculoskeletal: Negative for  back pain. Skin: Positive for puncture wounds to the dorsum and lateral aspect of the right foot and abrasion to the top of right hand Neurological: Negative for headaches, focal weakness or numbness.  10-point ROS otherwise  negative.  ____________________________________________   PHYSICAL EXAM:  VITAL SIGNS: ED Triage Vitals  Enc Vitals Group     BP 02/02/15 1701 132/57 mmHg     Pulse Rate 02/02/15 1701 78     Resp 02/02/15 1701 20     Temp 02/02/15 1701 98.3 F (36.8 C)     Temp Source 02/02/15 1701 Oral     SpO2 02/02/15 1701 97 %     Weight 02/02/15 1701 115 lb (52.164 kg)     Height 02/02/15 1701 5\' 5"  (1.651 m)     Head Cir --      Peak Flow --      Pain Score 02/02/15 1710 0     Pain Loc --      Pain Edu? --      Excl. in [place]? --     Constitutional: Alert and oriented. Well appearing and in no acute distress. Eyes: Conjunctivae are normal. PERRL. EOMI. Head: Atraumatic. Nose: No congestion/rhinnorhea. Mouth/Throat: Mucous membranes are moist.  Oropharynx non-erythematous. Neck: No stridor.   Cardiovascular: Normal rate, regular rhythm. Grossly normal heart sounds.  Good peripheral circulation. Respiratory: Normal respiratory effort.  No retractions. Lungs CTAB. Gastrointestinal: Soft and nontender. No distention. No abdominal bruits. No CVA tenderness. Musculoskeletal: No lower extremity tenderness nor edema.  No joint effusions. Neurologic:  Normal speech and language. No gross focal neurologic deficits are appreciated. No gait instability. Skin:  Puncture wound noted to the dorsum of the right foot into the anterior aspect of the right hand. Bleeding controlled on the right hand however continues to bleed at the right foot. Psychiatric: Mood and affect are normal. Speech and behavior are normal.  ____________________________________________   LABS (all labs ordered are listed, but only abnormal results are displayed)  Labs Reviewed - No data to display ____________________________________________   PROCEDURES  Procedure(s) performed:  Wound cleansed copiously with Betadine and saline. Steri-Strips placed pressure gauze applied.  Critical Care performed:  No  ____________________________________________   INITIAL IMPRESSION / ASSESSMENT AND PLAN / ED COURSE  Pertinent labs & imaging results that were available during my care of the patient were reviewed by me and considered in my medical decision making (see chart for details).  Ground BITE to right hand and foot. He is vaccination prophylactically started. Rx given for doxycycline 100 mg twice a day and clindamycin 300 mg 3 times a day secondary to allergies to sulfa and penicillins. Patient reports she will follow-up with her PCP or return here for subsequent rabies vaccination series. At this time she voices no other emergency medical complaints and will return to the ER with any worsening symptomology. ____________________________________________   FINAL CLINICAL IMPRESSION(S) / ED DIAGNOSES  Final diagnoses:  Animal bite of hand, right, initial encounter  Animal bite of foot, right, initial encounter  Rabies, need for prophylactic vaccination against      Arlyss Repress, PA-C 02/02/15 1908  Ahmed Prima, MD 02/02/15 [phone]

## 2015-02-02 NOTE — ED Notes (Signed)
Pt presents to the ER from home with complaints of animal bit to hand, pt reports her dog brought a groundhog home, pt reports she was trying to place this animal in a bag and groundhog was alive biting pt is right hand and right foot, pt reports she called Animal control and BPO.

## 2015-02-03 ENCOUNTER — Telehealth: Payer: Self-pay | Admitting: *Deleted

## 2015-02-03 NOTE — Telephone Encounter (Signed)
Spoke with patient and she's doing ok, she has started the rabies series and I advised we do not carry that vaccine, so she will continue to return to the hospital for injections.

## 2015-02-03 NOTE — Telephone Encounter (Signed)
okay

## 2015-02-04 NOTE — Telephone Encounter (Signed)
Pt left v/m; pt is supposed to get the rabies report on the ground hog that bit the pt back today; pt began preventative rabies shots x 5 on 02/02/15. Pt wants to know if ground hog report comes back and animal does not have rabies does pt have to continue on 02/05/15 with additional rabies shots. Pt request cb.

## 2015-02-05 NOTE — Telephone Encounter (Signed)
Spoke with patient and advised results   

## 2015-02-05 NOTE — Telephone Encounter (Signed)
If the animal is tested and doesn't have rabies, she doesn't have to continue the shots

## 2015-02-11 ENCOUNTER — Encounter: Payer: Self-pay | Admitting: Internal Medicine

## 2015-02-11 ENCOUNTER — Other Ambulatory Visit: Payer: Self-pay | Admitting: Internal Medicine

## 2015-02-11 DIAGNOSIS — K31819 Angiodysplasia of stomach and duodenum without bleeding: Secondary | ICD-10-CM

## 2015-02-11 NOTE — Telephone Encounter (Signed)
I have ordered another blood count for her. Set up an appt for lab in the next week or so

## 2015-02-14 ENCOUNTER — Other Ambulatory Visit (INDEPENDENT_AMBULATORY_CARE_PROVIDER_SITE_OTHER): Payer: Medicare Other

## 2015-02-14 DIAGNOSIS — Q2733 Arteriovenous malformation of digestive system vessel: Secondary | ICD-10-CM | POA: Diagnosis not present

## 2015-02-14 DIAGNOSIS — K31819 Angiodysplasia of stomach and duodenum without bleeding: Secondary | ICD-10-CM

## 2015-02-14 LAB — CBC
HCT: 30.2 % — ABNORMAL LOW (ref 36.0–46.0)
Hemoglobin: 9.7 g/dL — ABNORMAL LOW (ref 12.0–15.0)
MCHC: 32.2 g/dL (ref 30.0–36.0)
MCV: 89.5 fl (ref 78.0–100.0)
PLATELETS: 333 10*3/uL (ref 150.0–400.0)
RBC: 3.38 Mil/uL — ABNORMAL LOW (ref 3.87–5.11)
RDW: 20.4 % — ABNORMAL HIGH (ref 11.5–15.5)
WBC: 5.9 10*3/uL (ref 4.0–10.5)

## 2015-02-19 ENCOUNTER — Encounter: Payer: Self-pay | Admitting: Internal Medicine

## 2015-02-20 NOTE — Telephone Encounter (Signed)
Please send the lab as she has requested--you may have already sent it to [facility]

## 2015-03-31 ENCOUNTER — Encounter: Payer: Self-pay | Admitting: Internal Medicine

## 2015-03-31 ENCOUNTER — Ambulatory Visit (INDEPENDENT_AMBULATORY_CARE_PROVIDER_SITE_OTHER): Payer: Medicare Other | Admitting: Internal Medicine

## 2015-03-31 VITALS — BP 120/70 | HR 85 | Temp 97.9°F | Wt 113.0 lb

## 2015-03-31 DIAGNOSIS — I83019 Varicose veins of right lower extremity with ulcer of unspecified site: Secondary | ICD-10-CM

## 2015-03-31 DIAGNOSIS — I481 Persistent atrial fibrillation: Secondary | ICD-10-CM | POA: Diagnosis not present

## 2015-03-31 DIAGNOSIS — I87311 Chronic venous hypertension (idiopathic) with ulcer of right lower extremity: Secondary | ICD-10-CM | POA: Diagnosis not present

## 2015-03-31 DIAGNOSIS — D500 Iron deficiency anemia secondary to blood loss (chronic): Secondary | ICD-10-CM | POA: Diagnosis not present

## 2015-03-31 DIAGNOSIS — Z23 Encounter for immunization: Secondary | ICD-10-CM | POA: Diagnosis not present

## 2015-03-31 DIAGNOSIS — L97919 Non-pressure chronic ulcer of unspecified part of right lower leg with unspecified severity: Principal | ICD-10-CM

## 2015-03-31 DIAGNOSIS — I4819 Other persistent atrial fibrillation: Secondary | ICD-10-CM

## 2015-03-31 MED ORDER — CALCIUM ALGINATE POWD
1.0000 | Freq: Every day | Status: DC
Start: 2015-03-31 — End: 2015-04-02

## 2015-03-31 NOTE — Progress Notes (Signed)
Pre visit review using our clinic review tool, if applicable. No additional management support is needed unless otherwise documented below in the visit note. 

## 2015-03-31 NOTE — Addendum Note (Signed)
Addended by: Despina Hidden on: 03/31/2015 05:23 PM   Modules accepted: Orders

## 2015-03-31 NOTE — Assessment & Plan Note (Signed)
Continues on the iron Will recheck labs

## 2015-03-31 NOTE — Addendum Note (Signed)
Addended by: Ellamae Sia on: 03/31/2015 05:06 PM   Modules accepted: Orders

## 2015-03-31 NOTE — Assessment & Plan Note (Signed)
Slight slough Not granulating due to edema and some drainage Can try calcium alginate dressing

## 2015-03-31 NOTE — Patient Instructions (Signed)
Please try over the counter nasacort for the runny nose.

## 2015-03-31 NOTE — Progress Notes (Signed)
Subjective:    Patient ID: [name], female    DOB: June 07, 1927, 81 y.o.   MRN: [id number]  HPI Here for follow up of anemia and other medical conditions Ongoing stress--- husband has had 3 recent falls--- and other things  Has had some swelling in right calf Bumped calf and has persistent weeping lesion there Worried about cellulitis Trying to hold off on furosemide  Reviewed the ground hog attack It was in her bedroom-- the border collie brought it in from outside through doggie dog Rabies test was negative Did lose a fair amount of blood with this event  Current Outpatient Prescriptions on File Prior to Visit  Medication Sig Dispense Refill  . acetaminophen (TYLENOL) 650 MG CR tablet Take 650 mg by mouth every 8 (eight) hours as needed for pain.    Marland Kitchen apixaban (ELIQUIS) 2.5 MG TABS tablet Take 1 tablet (2.5 mg total) by mouth 2 (two) times daily. 60 tablet 3  . atenolol (TENORMIN) 25 MG tablet TAKE ONE TABLET BY MOUTH ONCE DAILY 90 tablet 3  . Biotin 5000 MCG CAPS Take by mouth daily.      Marland Kitchen CALCIUM CARBONATE PO Take 1,200 mg by mouth daily.    . CHONDROITIN SULFATE PO Take 1,200 mg by mouth daily.    . cyanocobalamin 2000 MCG tablet Take 2,000 mcg by mouth daily.    [name] Sodium (COLACE PO) Take by mouth as needed.    . folic acid (FOLVITE) 1 MG tablet Take 2 mg by mouth daily.     . furosemide (LASIX) 40 MG tablet TAKE ONE TABLET BY MOUTH ONCE DAILY AS NEEDED 90 tablet 3  . Glucosamine HCl (GLUCOSAMINE PO) Take 1,500 mg by mouth daily.    . hydroxychloroquine (PLAQUENIL) 200 MG tablet Take 400 mg by mouth daily.     Marland Kitchen levothyroxine (SYNTHROID, LEVOTHROID) 75 MCG tablet TAKE ONE TABLET BY MOUTH ONCE DAILY BEFORE BREAKFAST 90 tablet 3  . magnesium hydroxide (MILK OF MAGNESIA) 400 MG/5ML suspension Take by mouth daily as needed for mild constipation.    . methotrexate (RHEUMATREX) 2.5 MG tablet Take 5 tablets on Wednesday.    . Multiple Vitamins-Minerals (ONE-A-DAY 50  PLUS PO) Take by mouth daily.      Marland Kitchen omeprazole (PRILOSEC) 40 MG capsule Take 40 mg by mouth daily.    . Polyethylene Glycol 3350 (MIRALAX PO) Take by mouth as needed.     No current facility-administered medications on file prior to visit.    Allergies  Allergen Reactions  . Dabigatran Etexilate Mesylate Shortness Of Breath  . Coumadin [Warfarin Sodium] Other (See Comments)    hematoma  . Doxycycline Hyclate [Doxycycline] Itching  . Penicillins   . Pradaxa [Dabigatran Etexilate Mesylate] Other (See Comments)    SLEEP  . Sulfonamide Derivatives   . Tape Other (See Comments)    blisters  . Tramadol Hcl     Itching     Past Medical History  Diagnosis Date  . Arthritis   . Hyperlipidemia   . Hypothyroidism   . Atrial fibrillation     a. s/p prior DCCV's, now persistent-->Xarelto (CHA2DS2VASc = 3).  . Diverticulosis     a. by colonoscopy, h/o colon adenoma ([facility])  . Gastritis and duodenitis     a. 01/2014 EGD: Moderate non-erosive gastritis and mild non-erosive duodenitis.    Past Surgical History  Procedure Laterality Date  . Rotator cuff repair    . Intraocular lens implant, secondary  1996/ 2010  .  Knee surgery      lateral  . Tonsillectomy    . Colonoscopy  02/2011    mod diverticulosis ([facility])  . Total knee arthroplasty Bilateral     Family History  Problem Relation Age of Onset  . Heart disease Mother     Social History   Social History  . Marital Status: Married    Spouse Name: N/A  . Number of Children: N/A  . Years of Education: N/A   Occupational History  . retired    Social History Main Topics  . Smoking status: Former Smoker    Types: Cigarettes  . Smokeless tobacco: Never Used  . Alcohol Use: Yes  . Drug Use: No  . Sexual Activity: Not on file   Other Topics Concern  . Not on file   Social History Narrative   Regular exercise   Remains active with church and community activities      Has living will   Husband, then Barton Fanny (friend), is health care POA   Has DNR order---form done again 05/15/12   No tube feeds if cognitively unaware   Review of Systems Having some clear rhinorrhea from right nare Got cortisone injections in both shoulders (Dr Laren Boom now going for PT at [facility] Left is much better-- right only a little better    Objective:   Physical Exam  Constitutional: She appears well-developed. No distress.  Cardiovascular: Normal rate and normal heart sounds.  Exam reveals no gallop.   No murmur heard. irregular  Pulmonary/Chest: Effort normal and breath sounds normal. No respiratory distress. She has no wheezes. She has no rales.  Musculoskeletal:  Mild right calf edema only  Skin:  Stage 2 superficial anterior right calf ulcer No infection  Psychiatric: She has a normal mood and affect. Her behavior is normal.          Assessment & Plan:

## 2015-03-31 NOTE — Assessment & Plan Note (Signed)
Rate is fine Will continue the apixaban

## 2015-04-01 ENCOUNTER — Telehealth: Payer: Self-pay | Admitting: *Deleted

## 2015-04-01 DIAGNOSIS — D500 Iron deficiency anemia secondary to blood loss (chronic): Secondary | ICD-10-CM

## 2015-04-01 LAB — CBC WITH DIFFERENTIAL/PLATELET
BASOS ABS: 0 10*3/uL (ref 0.0–0.1)
Basophils Relative: 0.1 % (ref 0.0–3.0)
Eosinophils Absolute: 0 10*3/uL (ref 0.0–0.7)
Eosinophils Relative: 0 % (ref 0.0–5.0)
HCT: 30.1 % — ABNORMAL LOW (ref 36.0–46.0)
Hemoglobin: 9.7 g/dL — ABNORMAL LOW (ref 12.0–15.0)
LYMPHS ABS: 0.7 10*3/uL (ref 0.7–4.0)
Lymphocytes Relative: 11.3 % — ABNORMAL LOW (ref 12.0–46.0)
MCHC: 32.3 g/dL (ref 30.0–36.0)
MCV: 91.7 fl (ref 78.0–100.0)
Monocytes Absolute: 0.7 10*3/uL (ref 0.1–1.0)
Monocytes Relative: 10.6 % (ref 3.0–12.0)
NEUTROS PCT: 78 % — AB (ref 43.0–77.0)
Neutro Abs: 4.9 10*3/uL (ref 1.4–7.7)
PLATELETS: 334 10*3/uL (ref 150.0–400.0)
RBC: 3.28 Mil/uL — ABNORMAL LOW (ref 3.87–5.11)
RDW: 20.1 % — ABNORMAL HIGH (ref 11.5–15.5)
WBC: 6.3 10*3/uL (ref 4.0–10.5)

## 2015-04-01 LAB — IBC PANEL
IRON: 20 ug/dL — AB (ref 42–145)
Saturation Ratios: 5 % — ABNORMAL LOW (ref 20.0–50.0)
Transferrin: 285 mg/dL (ref 212.0–360.0)

## 2015-04-01 LAB — FERRITIN: Ferritin: 41 ng/mL (ref 10.0–291.0)

## 2015-04-01 NOTE — Telephone Encounter (Signed)
Patient was seen 9/26 for venous stasis ulcer and Calcium Alginate dressing was prescribed.  Per pharmacist at [facility], this is not something they can provide.  This must be picked up from a medical supply store.

## 2015-04-02 ENCOUNTER — Encounter: Payer: Self-pay | Admitting: Internal Medicine

## 2015-04-02 MED ORDER — CALCIUM ALGINATE POWD: Status: DC

## 2015-04-02 NOTE — Telephone Encounter (Signed)
rx sent to pharmacy by e-script, to [facility]

## 2015-04-02 NOTE — Telephone Encounter (Signed)
Please see if you can find someplace that has this--she can continue to use neosporin on regular gauze till then (that will absorb moisture better). It is okay if the gauze sticks a little and pulls some of the covering off the wound (at least for a couple of times)

## 2015-04-02 NOTE — Telephone Encounter (Signed)
Consult for hematology put in

## 2015-04-02 NOTE — Telephone Encounter (Signed)
[name] with [facility] cannot find calcium alginate powder; [name] can order calcium alginate dressing 4 " x 4 " with 10 per box;cost to pt would be $ 70.00. Is that what Dr Silvio Pate wants pt to have. [name] at [facility] request cb ASAP. If orders today can have for pt on 04/03/15.

## 2015-04-02 NOTE — Telephone Encounter (Signed)
Spoke with patient and advised results, she will check with [place]  Also pt states she would like to see a hematologist in [place], per lab results

## 2015-04-03 NOTE — Telephone Encounter (Signed)
Spoke with pharmacist at [facility] and they most of the Calcium alginate is out of stock right now, they did find a Silver alginate powder ( not sure of the price) and also the calcium alginate dressing, all of this would have to be ordered, they do not stock this.

## 2015-04-03 NOTE — Telephone Encounter (Signed)
Please speak to the pharmacist and see what type of calcium alginate they have. Needs it once a day probably  Let her know I put in the referral for the hematologist

## 2015-04-04 NOTE — Telephone Encounter (Signed)
Pt left vm requesting to send substitute for calcium alginate powder to [facility]. Pt understands that [facility] has contacted Dr Alla German office and [facility] is still waiting for substitute for Ca alginate powder.

## 2015-04-05 NOTE — Telephone Encounter (Signed)
You will need to check with the pharmacist as I didn't see the order I would have wanted in Epic

## 2015-04-07 MED ORDER — "ALGICELL CALCIUM DRESSING 4""X4 EX MISC": 1.0000 | Freq: Every day | CUTANEOUS | Status: DC

## 2015-04-07 NOTE — Addendum Note (Signed)
Addended by: Despina Hidden on: 04/07/2015 04:12 PM   Modules accepted: Orders

## 2015-04-07 NOTE — Addendum Note (Signed)
Addended by: Despina Hidden on: 04/07/2015 02:44 PM   Modules accepted: Orders

## 2015-04-07 NOTE — Telephone Encounter (Signed)
Dressing please

## 2015-04-07 NOTE — Telephone Encounter (Signed)
Do you want a powder or dressing per the pharmacist?

## 2015-04-07 NOTE — Telephone Encounter (Signed)
Approved:  Change daily  #10 x 2

## 2015-04-07 NOTE — Telephone Encounter (Signed)
This is what [facility] can get

## 2015-04-07 NOTE — Telephone Encounter (Signed)
rx sent to pharmacy by e-script  

## 2015-04-08 ENCOUNTER — Encounter: Payer: Self-pay | Admitting: Internal Medicine

## 2015-04-11 ENCOUNTER — Encounter: Payer: Self-pay | Admitting: Internal Medicine

## 2015-04-11 ENCOUNTER — Ambulatory Visit (INDEPENDENT_AMBULATORY_CARE_PROVIDER_SITE_OTHER): Payer: Medicare Other | Admitting: Internal Medicine

## 2015-04-11 VITALS — BP 124/66 | HR 76 | Temp 98.0°F | Wt 114.0 lb

## 2015-04-11 DIAGNOSIS — I83019 Varicose veins of right lower extremity with ulcer of unspecified site: Secondary | ICD-10-CM

## 2015-04-11 DIAGNOSIS — L97919 Non-pressure chronic ulcer of unspecified part of right lower leg with unspecified severity: Principal | ICD-10-CM

## 2015-04-11 NOTE — Progress Notes (Signed)
Subjective:    Patient ID: [name], female    DOB: 1927-04-21, 81 y.o.   MRN: [id number]  HPI Here to have me recheck the leg ulcer They have worsened Finally got the calcium alginate dressing--- started it 2-3 days ago  New open areas on both calves Painful--especially at night  Not much swelling now Had been using support hose--stopped a few months ago  Current Outpatient Prescriptions on File Prior to Visit  Medication Sig Dispense Refill  . acetaminophen (TYLENOL) 650 MG CR tablet Take 650 mg by mouth every 8 (eight) hours as needed for pain.    Marland Kitchen apixaban (ELIQUIS) 2.5 MG TABS tablet Take 1 tablet (2.5 mg total) by mouth 2 (two) times daily. 60 tablet 3  . atenolol (TENORMIN) 25 MG tablet TAKE ONE TABLET BY MOUTH ONCE DAILY 90 tablet 3  . Biotin 5000 MCG CAPS Take by mouth daily.      . Calcium Alginate (ALGICELL CALCIUM DRESSING 4"X4) MISC Apply 1 each topically daily. 10 each 2  . CALCIUM CARBONATE PO Take 1,200 mg by mouth daily.    . CHONDROITIN SULFATE PO Take 1,200 mg by mouth daily.    . cyanocobalamin 2000 MCG tablet Take 2,000 mcg by mouth daily.    [name] Sodium (COLACE PO) Take by mouth as needed.    . Ferrous Sulfate (IRON) 325 (65 FE) MG TABS Take by mouth daily.    . folic acid (FOLVITE) 1 MG tablet Take 2 mg by mouth daily.     . furosemide (LASIX) 40 MG tablet TAKE ONE TABLET BY MOUTH ONCE DAILY AS NEEDED 90 tablet 3  . Glucosamine HCl (GLUCOSAMINE PO) Take 1,500 mg by mouth daily.    . hydroxychloroquine (PLAQUENIL) 200 MG tablet Take 400 mg by mouth daily.     Marland Kitchen levothyroxine (SYNTHROID, LEVOTHROID) 75 MCG tablet TAKE ONE TABLET BY MOUTH ONCE DAILY BEFORE BREAKFAST 90 tablet 3  . magnesium hydroxide (MILK OF MAGNESIA) 400 MG/5ML suspension Take by mouth daily as needed for mild constipation.    . methotrexate (RHEUMATREX) 2.5 MG tablet Take 5 tablets on Wednesday.    . Multiple Vitamins-Minerals (ONE-A-DAY 50 PLUS PO) Take by mouth daily.      Marland Kitchen  omeprazole (PRILOSEC) 40 MG capsule Take 40 mg by mouth daily.    . OxyCODONE (OXYCONTIN) 10 mg T12A 12 hr tablet Take 10 mg by mouth every 12 (twelve) hours.     . Polyethylene Glycol 3350 (MIRALAX PO) Take by mouth as needed.     No current facility-administered medications on file prior to visit.    Allergies  Allergen Reactions  . Dabigatran Etexilate Mesylate Shortness Of Breath  . Coumadin [Warfarin Sodium] Other (See Comments)    hematoma  . Doxycycline Hyclate [Doxycycline] Itching  . Penicillins   . Pradaxa [Dabigatran Etexilate Mesylate] Other (See Comments)    SLEEP  . Sulfonamide Derivatives   . Tape Other (See Comments)    blisters  . Tramadol Hcl     Itching     Past Medical History  Diagnosis Date  . Arthritis   . Hyperlipidemia   . Hypothyroidism   . Atrial fibrillation ([place])     a. s/p prior DCCV's, now persistent-->Xarelto (CHA2DS2VASc = 3).  . Diverticulosis     a. by colonoscopy, h/o colon adenoma ([facility])  . Gastritis and duodenitis     a. 01/2014 EGD: Moderate non-erosive gastritis and mild non-erosive duodenitis.    Past Surgical History  Procedure Laterality Date  . Rotator cuff repair    . Intraocular lens implant, secondary  1996/ 2010  . Knee surgery      lateral  . Tonsillectomy    . Colonoscopy  02/2011    mod diverticulosis ([facility])  . Total knee arthroplasty Bilateral     Family History  Problem Relation Age of Onset  . Heart disease Mother     Social History   Social History  . Marital Status: Married    Spouse Name: N/A  . Number of Children: N/A  . Years of Education: N/A   Occupational History  . retired    Social History Main Topics  . Smoking status: Former Smoker    Types: Cigarettes  . Smokeless tobacco: Never Used  . Alcohol Use: Yes  . Drug Use: No  . Sexual Activity: Not on file   Other Topics Concern  . Not on file   Social History Narrative   Regular exercise   Remains active with church and  community activities      Has living will   Husband, then Barton Fanny (friend), is health care POA   Has DNR order---form done again 05/15/12   No tube feeds if cognitively unaware   Review of Systems  Has appt with hematologist next week Still not much appetite     Objective:   Physical Exam  Skin:  Weeping ulcer on right calf is slightly smaller and drier Multiple superficial ulcers on both calves now--medial sides All are granulating          Assessment & Plan:

## 2015-04-11 NOTE — Progress Notes (Signed)
Pre visit review using our clinic review tool, if applicable. No additional management support is needed unless otherwise documented below in the visit note. 

## 2015-04-11 NOTE — Assessment & Plan Note (Signed)
Weeping lesion is better but multiple new lesions She switched to gauze all over--I think that may be pulling off her very friable skin No infection Discussed using alginate only at the weeping spot---- thick layer of neosporin with no dressing or non stick dressing on all the other irritated areas

## 2015-04-15 ENCOUNTER — Encounter: Payer: Self-pay | Admitting: Internal Medicine

## 2015-04-15 ENCOUNTER — Ambulatory Visit (INDEPENDENT_AMBULATORY_CARE_PROVIDER_SITE_OTHER): Payer: Medicare Other | Admitting: Internal Medicine

## 2015-04-15 VITALS — BP 140/80 | HR 90 | Temp 97.4°F | Wt 117.0 lb

## 2015-04-15 DIAGNOSIS — I83019 Varicose veins of right lower extremity with ulcer of unspecified site: Secondary | ICD-10-CM | POA: Diagnosis not present

## 2015-04-15 DIAGNOSIS — L97919 Non-pressure chronic ulcer of unspecified part of right lower leg with unspecified severity: Principal | ICD-10-CM

## 2015-04-15 NOTE — Progress Notes (Signed)
Pre visit review using our clinic review tool, if applicable. No additional management support is needed unless otherwise documented below in the visit note. 

## 2015-04-15 NOTE — Assessment & Plan Note (Signed)
Worse with very friable skin No infection  Wounds dressed with petrolatum dressings and cling Will set up urgent appt at wound center since not improving

## 2015-04-15 NOTE — Progress Notes (Signed)
Subjective:    Patient ID: [name], female    DOB: 06-28-1927, 81 y.o.   MRN: [id number]  HPI Here for check of wound  Having "awful pain in calves" Skin has all come off Has run out of dressings  Had been going to wound center at [facility] Then switched to [facility]  Current Outpatient Prescriptions on File Prior to Visit  Medication Sig Dispense Refill  . acetaminophen (TYLENOL) 650 MG CR tablet Take 650 mg by mouth every 8 (eight) hours as needed for pain.    Marland Kitchen apixaban (ELIQUIS) 2.5 MG TABS tablet Take 1 tablet (2.5 mg total) by mouth 2 (two) times daily. 60 tablet 3  . atenolol (TENORMIN) 25 MG tablet TAKE ONE TABLET BY MOUTH ONCE DAILY 90 tablet 3  . Biotin 5000 MCG CAPS Take by mouth daily.      . Calcium Alginate (ALGICELL CALCIUM DRESSING 4"X4) MISC Apply 1 each topically daily. 10 each 2  . CALCIUM CARBONATE PO Take 1,200 mg by mouth daily.    . CHONDROITIN SULFATE PO Take 1,200 mg by mouth daily.    . cyanocobalamin 2000 MCG tablet Take 2,000 mcg by mouth daily.    [name] Sodium (COLACE PO) Take by mouth as needed.    . Ferrous Sulfate (IRON) 325 (65 FE) MG TABS Take by mouth daily.    . folic acid (FOLVITE) 1 MG tablet Take 2 mg by mouth daily.     . furosemide (LASIX) 40 MG tablet TAKE ONE TABLET BY MOUTH ONCE DAILY AS NEEDED 90 tablet 3  . Glucosamine HCl (GLUCOSAMINE PO) Take 1,500 mg by mouth daily.    . hydroxychloroquine (PLAQUENIL) 200 MG tablet Take 400 mg by mouth daily.     Marland Kitchen levothyroxine (SYNTHROID, LEVOTHROID) 75 MCG tablet TAKE ONE TABLET BY MOUTH ONCE DAILY BEFORE BREAKFAST 90 tablet 3  . magnesium hydroxide (MILK OF MAGNESIA) 400 MG/5ML suspension Take by mouth daily as needed for mild constipation.    . methotrexate (RHEUMATREX) 2.5 MG tablet Take 5 tablets on Wednesday.    . Multiple Vitamins-Minerals (ONE-A-DAY 50 PLUS PO) Take by mouth daily.      Marland Kitchen omeprazole (PRILOSEC) 40 MG capsule Take 40 mg by mouth daily.    . OxyCODONE (OXYCONTIN) 10 mg  T12A 12 hr tablet Take 10 mg by mouth every 12 (twelve) hours.     . Polyethylene Glycol 3350 (MIRALAX PO) Take by mouth as needed.     No current facility-administered medications on file prior to visit.    Allergies  Allergen Reactions  . Dabigatran Etexilate Mesylate Shortness Of Breath  . Coumadin [Warfarin Sodium] Other (See Comments)    hematoma  . Doxycycline Hyclate [Doxycycline] Itching  . Penicillins   . Pradaxa [Dabigatran Etexilate Mesylate] Other (See Comments)    SLEEP  . Sulfonamide Derivatives   . Tape Other (See Comments)    blisters  . Tramadol Hcl     Itching     Past Medical History  Diagnosis Date  . Arthritis   . Hyperlipidemia   . Hypothyroidism   . Atrial fibrillation ([place])     a. s/p prior DCCV's, now persistent-->Xarelto (CHA2DS2VASc = 3).  . Diverticulosis     a. by colonoscopy, h/o colon adenoma ([facility])  . Gastritis and duodenitis     a. 01/2014 EGD: Moderate non-erosive gastritis and mild non-erosive duodenitis.    Past Surgical History  Procedure Laterality Date  . Rotator cuff repair    .  Intraocular lens implant, secondary  1996/ 2010  . Knee surgery      lateral  . Tonsillectomy    . Colonoscopy  02/2011    mod diverticulosis ([facility])  . Total knee arthroplasty Bilateral     Family History  Problem Relation Age of Onset  . Heart disease Mother     Social History   Social History  . Marital Status: Married    Spouse Name: N/A  . Number of Children: N/A  . Years of Education: N/A   Occupational History  . retired    Social History Main Topics  . Smoking status: Former Smoker    Types: Cigarettes  . Smokeless tobacco: Never Used  . Alcohol Use: Yes  . Drug Use: No  . Sexual Activity: Not on file   Other Topics Concern  . Not on file   Social History Narrative   Regular exercise   Remains active with church and community activities      Has living will   Husband, then [name] (friend), is health  care POA   Has DNR order---form done again 05/15/12   No tube feeds if cognitively unaware   Review of Systems  No fever Appetite is off     Objective:   Physical Exam  Skin:  Widespread superficial open areas on both lower calves  No infection Early granulation on original small area          Assessment & Plan:

## 2015-04-16 ENCOUNTER — Encounter: Payer: Medicare Other | Attending: Surgery | Admitting: Surgery

## 2015-04-16 ENCOUNTER — Inpatient Hospital Stay: Payer: Medicare Other | Admitting: Internal Medicine

## 2015-04-16 ENCOUNTER — Other Ambulatory Visit
Admission: RE | Admit: 2015-04-16 | Discharge: 2015-04-16 | Disposition: A | Discharge status: Home / Self Care | Payer: Medicare Other | Source: Ambulatory Visit | Attending: Surgery | Admitting: Surgery

## 2015-04-16 DIAGNOSIS — M069 Rheumatoid arthritis, unspecified: Secondary | ICD-10-CM | POA: Insufficient documentation

## 2015-04-16 DIAGNOSIS — N189 Chronic kidney disease, unspecified: Secondary | ICD-10-CM | POA: Diagnosis not present

## 2015-04-16 DIAGNOSIS — I872 Venous insufficiency (chronic) (peripheral): Secondary | ICD-10-CM | POA: Diagnosis not present

## 2015-04-16 DIAGNOSIS — I482 Chronic atrial fibrillation: Secondary | ICD-10-CM | POA: Insufficient documentation

## 2015-04-16 DIAGNOSIS — I83212 Varicose veins of right lower extremity with both ulcer of calf and inflammation: Secondary | ICD-10-CM | POA: Insufficient documentation

## 2015-04-16 DIAGNOSIS — J449 Chronic obstructive pulmonary disease, unspecified: Secondary | ICD-10-CM | POA: Diagnosis not present

## 2015-04-16 DIAGNOSIS — S81801A Unspecified open wound, right lower leg, initial encounter: Secondary | ICD-10-CM | POA: Insufficient documentation

## 2015-04-16 DIAGNOSIS — L03116 Cellulitis of left lower limb: Secondary | ICD-10-CM | POA: Insufficient documentation

## 2015-04-16 DIAGNOSIS — I83222 Varicose veins of left lower extremity with both ulcer of calf and inflammation: Secondary | ICD-10-CM | POA: Diagnosis not present

## 2015-04-16 DIAGNOSIS — L03115 Cellulitis of right lower limb: Secondary | ICD-10-CM | POA: Insufficient documentation

## 2015-04-16 DIAGNOSIS — L089 Local infection of the skin and subcutaneous tissue, unspecified: Secondary | ICD-10-CM | POA: Diagnosis present

## 2015-04-16 DIAGNOSIS — Z87891 Personal history of nicotine dependence: Secondary | ICD-10-CM | POA: Diagnosis not present

## 2015-04-16 DIAGNOSIS — L97221 Non-pressure chronic ulcer of left calf limited to breakdown of skin: Secondary | ICD-10-CM | POA: Insufficient documentation

## 2015-04-16 DIAGNOSIS — L97211 Non-pressure chronic ulcer of right calf limited to breakdown of skin: Secondary | ICD-10-CM | POA: Diagnosis not present

## 2015-04-16 DIAGNOSIS — I129 Hypertensive chronic kidney disease with stage 1 through stage 4 chronic kidney disease, or unspecified chronic kidney disease: Secondary | ICD-10-CM | POA: Insufficient documentation

## 2015-04-16 NOTE — Progress Notes (Signed)
[name] ([id number]) Visit Report for 04/16/2015 Abuse/Suicide Risk Screen Details Patient Name: [name], [name] 04/16/2015 10:15 Date of Service: AM Medical Record [id number] Number: Patient Account Number: [id number] 01/14/1927 (81 y.o. Treating RN: Baruch Gouty, RN, BSN, Velva Harman Date of Birth/Sex: Female) Other Clinician: Primary Care Physician: Quinn Axe, Errol Referring Physician: Viviana Simpler Physician/Extender: Weeks in Treatment: 0 Abuse/Suicide Risk Screen Items Answer ABUSE/SUICIDE RISK SCREEN: Has anyone close to you tried to hurt or harm you recentlyo No Do you feel uncomfortable with anyone in your familyo No Has anyone forced you do things that you didnot want to doo No Do you have any thoughts of harming yourselfo No Patient displays signs or symptoms of abuse and/or neglect. No Electronic Signature(s) Signed: 04/16/2015 10:22:00 AM By: Regan Lemming BSN, RN Entered By: Regan Lemming on 04/16/2015 10:22:00 [name] ([id number]) -------------------------------------------------------------------------------- Activities of Daily Living Details Patient Name: [name] 04/16/2015 10:15 Date of Service: AM Medical Record [id number] Number: Patient Account Number: [id number] 26-Jun-1927 (81 y.o. Treating RN: Baruch Gouty, RN, BSN, Velva Harman Date of Birth/Sex: Female) Other Clinician: Primary Care Physician: Jose Persia Referring Physician: Viviana Simpler Physician/Extender: Weeks in Treatment: 0 Activities of Daily Living Items Answer Activities of Daily Living (Please select one for each item) Drive Automobile Completely Able Take Medications Completely Able Use Telephone Completely Able Care for Appearance Completely Able Use Toilet Completely Able Bath / Shower Completely Able Dress Self Completely Able Feed Self Completely Able Walk Completely Able Get In / Out Bed Completely Able Housework  Completely Able Prepare Meals Completely Branch for Self Completely Able Electronic Signature(s) Signed: 04/16/2015 10:20:44 AM By: Regan Lemming BSN, RN Entered By: Regan Lemming on 04/16/2015 10:20:43 [name] ([id number]) -------------------------------------------------------------------------------- Education Assessment Details Patient Name: [name] 04/16/2015 10:15 Date of Service: AM Medical Record [id number] Number: Patient Account Number: [id number] 1927-05-13 (81 y.o. Treating RN: Baruch Gouty, RN, BSN, Velva Harman Date of Birth/Sex: Female) Other Clinician: Primary Care Physician: Jose Persia Referring Physician: Viviana Simpler Physician/Extender: [place] in Treatment: 0 Primary Learner Assessed: Patient Learning Preferences/Education Level/Primary Language Learning Preference: Explanation Highest Education Level: College or Above Preferred Language: English Cognitive Barrier Assessment/Beliefs Language Barrier: No Physical Barrier Assessment Impaired Vision: No Impaired Hearing: No Decreased Hand dexterity: No Knowledge/Comprehension Assessment Knowledge Level: High Comprehension Level: High Ability to understand written High instructions: Ability to understand verbal High instructions: Motivation Assessment Anxiety Level: Calm Cooperation: Cooperative Education Importance: Acknowledges Need Interest in Health Problems: Asks Questions Perception: Coherent Willingness to Engage in Self- High Management Activities: Readiness to Engage in Self- High Management Activities: Electronic Signature(s) Signed: 04/16/2015 10:21:15 AM By: Regan Lemming BSN, RN Entered By: Regan Lemming on 04/16/2015 10:21:15 EVELEN, VAZGUEZ ([id number]) RHYLEIGH, GRASSEL ([id number]) -------------------------------------------------------------------------------- Fall Risk Assessment Details Patient Name: [name], ELEY 04/16/2015 10:15 Date of Service: AM Medical Record [id number] Number: Patient Account Number: [id number] 06-05-1927 (81 y.o. Treating RN: Baruch Gouty, RN, BSN, Velva Harman Date of Birth/Sex: Female) Other Clinician: Primary Care Physician: Jose Persia Referring Physician: Viviana Simpler Physician/Extender: Weeks in Treatment: 0 Fall Risk Assessment Items FALL RISK ASSESSMENT: History of falling - immediate or within 3 months 0 No Secondary diagnosis 0 No Ambulatory aid None/bed rest/wheelchair/nurse 0 Yes Crutches/cane/walker 0 No Furniture 0 No IV Access/Saline Lock 0 No Gait/Training Normal/bed rest/immobile 0 Yes Weak 0 No Impaired 0 No Mental Status Oriented to own ability 0 Yes Electronic Signature(s) Signed: 04/16/2015 10:21:52 AM  By: Regan Lemming BSN, RN Entered By: Regan Lemming on 04/16/2015 10:21:51 [name] ([id number]) -------------------------------------------------------------------------------- Foot Assessment Details Patient Name: [name] 04/16/2015 10:15 Date of Service: AM Medical Record [id number] Number: Patient Account Number: [id number] [birth year] (81 y.o. Treating RN: Baruch Gouty, RN, BSN, Velva Harman Date of Birth/Sex: Female) Other Clinician: Primary Care Physician: Quinn Axe, Errol Referring Physician: Viviana Simpler Physician/Extender: Weeks in Treatment: 0 Foot Assessment Items Site Locations + = Sensation present, - = Sensation absent, C = Callus, U = Ulcer R = Redness, W = Warmth, M = Maceration, PU = Pre-ulcerative lesion F = Fissure, S = Swelling, D = Dryness Assessment Right: Left: Other Deformity: No No Prior Foot Ulcer: No No Prior Amputation: No No Charcot Joint: No No Ambulatory Status: Ambulatory Without Help Gait: Steady Electronic Signature(s) Signed: 04/16/2015 10:21:36 AM By: Regan Lemming BSN, RN Entered By: Regan Lemming on 04/16/2015 10:21:36 [name]  ([id number]) -------------------------------------------------------------------------------- Nutrition Risk Assessment Details Patient Name: [name] 04/16/2015 10:15 Date of Service: AM Medical Record [id number] Number: Patient Account Number: [id number] Sep 28, 1926 (81 y.o. Treating RN: Afful, RN, BSN, Velva Harman Date of Birth/Sex: Female) Other Clinician: Primary Care Physician: Jose Persia Referring Physician: Viviana Simpler Physician/Extender: Weeks in Treatment: 0 Height (in): 62 Weight (lbs): 113.7 Body Mass Index (BMI): 20.8 Nutrition Risk Assessment Items NUTRITION RISK SCREEN: I have an illness or condition that made me change the kind and/or 0 No amount of food I eat I eat fewer than two meals per day 0 No I eat few fruits and vegetables, or milk products 0 No I have three or more drinks of beer, liquor or wine almost every day 0 No I have tooth or mouth problems that make it hard for me to eat 0 No I don't always have enough money to buy the food I need 0 No I eat alone most of the time 0 No I take three or more different prescribed or over-the-counter drugs a 0 No day Without wanting to, I have lost or gained 10 pounds in the last six 0 No months I am not always physically able to shop, cook and/or feed myself 0 No Nutrition Protocols Good Risk Protocol 0 No interventions needed Moderate Risk Protocol Electronic Signature(s) Signed: 04/16/2015 10:21:26 AM By: Regan Lemming BSN, RN Entered By: Regan Lemming on 04/16/2015 10:21:26

## 2015-04-16 NOTE — Progress Notes (Addendum)
[name], [name] ([id number]) Visit Report for 04/16/2015 Allergy List Details Patient Name: [name], [name]. Date of Service: 04/16/2015 10:15 AM Medical Record Number: [id number] Patient Account Number: [id number] Date of Birth/Sex: 28-Feb-1927 (81 y.o. Female) Treating RN: Afful, RN, BSN, Velva Harman Primary Care Physician: Viviana Simpler Other Clinician: Referring Physician: Viviana Simpler Treating Physician/Extender: Frann Rider in Treatment: 0 Allergies Active Allergies Penicillins Reaction: anaphylaxis Severity: Severe sulfa drugs Reaction: anaphylaxis Severity: Severe adhesive Reaction: blistering Severity: Severe Pradaxa Reaction: made her crazy Severity: Severe Ativan Reaction: disoriented Coumadin Allergy Notes Electronic Signature(s) Signed: 04/16/2015 10:20:18 AM By: Regan Lemming BSN, RN Entered By: Regan Lemming on 04/16/2015 10:20:18 [name] ([id number]) -------------------------------------------------------------------------------- Arrival Information Details Patient Name: [name]. Date of Service: 04/16/2015 10:15 AM Medical Record Number: [id number] Patient Account Number: [id number] Date of Birth/Sex: 1927-03-22 (81 y.o. Female) Treating RN: Afful, RN, BSN, Velva Harman Primary Care Physician: Viviana Simpler Other Clinician: Referring Physician: Viviana Simpler Treating Physician/Extender: Frann Rider in Treatment: 0 Visit Information Patient Arrived: Ambulatory Arrival Time: 10:27 Accompanied By: self Transfer Assistance: None Patient Identification Verified: Yes Secondary Verification Process Yes Completed: Patient Requires Transmission- No Based Precautions: Patient Has Alerts: Yes Patient Alerts: Patient on Blood Thinner History Since Last Visit Any new allergies or adverse reactions: No Had a fall or experienced change in activities of daily living that may affect risk of falls: No Signs or symptoms of abuse/neglect  since last visito No Electronic Signature(s) Signed: 04/16/2015 10:29:21 AM By: Regan Lemming BSN, RN Entered By: Regan Lemming on 04/16/2015 10:29:21 [name] ([id number]) -------------------------------------------------------------------------------- Clinic Level of Care Assessment Details Patient Name: [name] Date of Service: 04/16/2015 10:15 AM Medical Record Number: [id number] Patient Account Number: [id number] Date of Birth/Sex: 26-Nov-1926 (81 y.o. Female) Treating RN: Afful, RN, BSN, Velva Harman Primary Care Physician: Viviana Simpler Other Clinician: Referring Physician: Viviana Simpler Treating Physician/Extender: Frann Rider in Treatment: 0 Clinic Level of Care Assessment Items TOOL 1 Quantity Score []  - Use when EandM and Procedure is performed on INITIAL visit 0 ASSESSMENTS - Nursing Assessment / Reassessment X - General Physical Exam (combine w/ comprehensive assessment (listed just 1 20 below) when performed on new pt. evals) X - Comprehensive Assessment (HX, ROS, Risk Assessments, Wounds Hx, etc.) 1 25 ASSESSMENTS - Wound and Skin Assessment / Reassessment []  - Dermatologic / Skin Assessment (not related to wound area) 0 ASSESSMENTS - Ostomy and/or Continence Assessment and Care []  - Incontinence Assessment and Management 0 []  - Ostomy Care Assessment and Management (repouching, etc.) 0 PROCESS - Coordination of Care X - Simple Patient / Family Education for ongoing care 1 15 []  - Complex (extensive) Patient / Family Education for ongoing care 0 X - Staff obtains Programmer, systems, Records, Test Results / Process Orders 1 10 []  - Staff telephones HHA, Nursing Homes / Clarify orders / etc 0 []  - Routine Transfer to another Facility (non-emergent condition) 0 []  - Routine Hospital Admission (non-emergent condition) 0 X - New Admissions / Biomedical engineer / Ordering NPWT, Apligraf, etc. 1 15 []  - Emergency Hospital Admission (emergent condition)  0 PROCESS - Special Needs []  - Pediatric / Minor Patient Management 0 []  - Isolation Patient Management 0 [name], [name]. ([id number]) []  - Hearing / Language / Visual special needs 0 []  - Assessment of Community assistance (transportation, D/C planning, etc.) 0 []  - Additional assistance / Altered mentation 0 []  - Support Surface(s) Assessment (bed, cushion, seat, etc.) 0 INTERVENTIONS - Miscellaneous []  - External ear  exam 0 []  - Patient Transfer (multiple staff / Civil Service fast streamer / Similar devices) 0 []  - Simple Staple / Suture removal (25 or less) 0 []  - Complex Staple / Suture removal (26 or more) 0 []  - Hypo/Hyperglycemic Management (do not check if billed separately) 0 []  - Ankle / Brachial Index (ABI) - do not check if billed separately 0 Has the patient been seen at the hospital within the last three years: Yes Total Score: 85 Level Of Care: New/Established - Level 3 Electronic Signature(s) Signed: 04/16/2015 11:34:44 AM By: Regan Lemming BSN, RN Entered By: Regan Lemming on 04/16/2015 11:34:44 [name] ([id number]) -------------------------------------------------------------------------------- Encounter Discharge Information Details Patient Name: [name]. Date of Service: 04/16/2015 10:15 AM Medical Record Number: [id number] Patient Account Number: [id number] Date of Birth/Sex: 31-May-1927 (81 y.o. Female) Treating RN: Baruch Gouty, RN, BSN, Velva Harman Primary Care Physician: Viviana Simpler Other Clinician: Referring Physician: Viviana Simpler Treating Physician/Extender: Frann Rider in Treatment: 0 Encounter Discharge Information Items Discharge Pain Level: 0 Discharge Condition: Stable Ambulatory Status: Ambulatory Discharge Destination: Home Transportation: Private Auto Accompanied By: self Schedule Follow-up Appointment: No Medication Reconciliation completed and provided to Patient/Care No Marton Malizia: Provided on Clinical Summary of Care: 04/16/2015 Form  Type Recipient Paper Patient JD Electronic Signature(s) Signed: 04/16/2015 11:22:09 AM By: Ruthine Dose Previous Signature: 04/16/2015 11:20:55 AM Version By: Regan Lemming BSN, RN Entered By: Ruthine Dose on 04/16/2015 11:22:09 [name] ([id number]) -------------------------------------------------------------------------------- Lower Extremity Assessment Details Patient Name: [name]. Date of Service: 04/16/2015 10:15 AM Medical Record Number: [id number] Patient Account Number: [id number] Date of Birth/Sex: 01-25-1927 (81 y.o. Female) Treating RN: Afful, RN, BSN, [place] Primary Care Physician: Viviana Simpler Other Clinician: Referring Physician: Viviana Simpler Treating Physician/Extender: Frann Rider in Treatment: 0 Edema Assessment Assessed: Shirlyn Goltz: No] Patrice Paradise: No] E[Left: dema] [Right: :] Calf Left: Right: Point of Measurement: 35 cm From Medial Instep 32.5 cm 35 cm Ankle Left: Right: Point of Measurement: 10 cm From Medial Instep 31.5 cm 22 cm Vascular Assessment Claudication: Claudication Assessment [Left:None] [Right:None] Pulses: Posterior Tibial Palpable: [Left:Yes] [Right:Yes] Dorsalis Pedis Palpable: [Left:Yes] [Right:Yes] Extremity colors, hair growth, and conditions: Extremity Color: [Left:Hyperpigmented] [Right:Hyperpigmented] Hair Growth on Extremity: [Left:No] [Right:No] Temperature of Extremity: [Left:Warm] [Right:Warm] Capillary Refill: [Left:< 3 seconds] [Right:< 3 seconds] Toe Nail Assessment Left: Right: Thick: No No Discolored: No No Deformed: No No Improper Length and Hygiene: No No Notes ABI NOT OBTAINED DUE TO PAIN. [name], [name] ([id number]) Electronic Signature(s) Signed: 04/16/2015 10:36:54 AM By: Regan Lemming BSN, RN Previous Signature: 04/16/2015 10:36:23 AM Version By: Regan Lemming BSN, RN Entered By: Regan Lemming on 04/16/2015 10:36:54 [name]  ([id number]) -------------------------------------------------------------------------------- Multi Wound Chart Details Patient Name: [name], [name]. Date of Service: 04/16/2015 10:15 AM Medical Record Number: [id number] Patient Account Number: [id number] Date of Birth/Sex: Jul 22, 1926 (81 y.o. Female) Treating RN: Baruch Gouty, RN, BSN, Velva Harman Primary Care Physician: Viviana Simpler Other Clinician: Referring Physician: Viviana Simpler Treating Physician/Extender: Frann Rider in Treatment: 0 Vital Signs Height(in): Pulse(bpm): 79 Weight(lbs): Blood Pressure 124/58 (mmHg): Body Mass Index(BMI): Temperature(F): 97.8 Respiratory Rate 18 (breaths/min): Photos: [6:No Photos] [7:No Photos] [N/A:N/A] Wound Location: [6:Left Lower Leg - Medial] [7:Right Lower Leg - Medial] [N/A:N/A] Wounding Event: [6:Gradually Appeared] [7:Gradually Appeared] [N/A:N/A] Primary Etiology: [6:Venous Leg Ulcer] [7:Venous Leg Ulcer] [N/A:N/A] Comorbid History: [6:Cataracts, Anemia, Arrhythmia, Rheumatoid Arthritis, Osteoarthritis, Received Chemotherapy] [7:Cataracts, Anemia, Arrhythmia, Rheumatoid Arthritis, Osteoarthritis, Received Chemotherapy] [N/A:N/A] Date Acquired: [6:04/07/2015] [7:04/07/2015] [N/A:N/A] Weeks of Treatment: [6:0] [7:0] [N/A:N/A] Wound Status: [6:Open] [7:Open] [N/A:N/A]  Measurements L x W x D 9x4x0.1 [7:13.5x8.3x0.1] [N/A:N/A] (cm) Area (cm) : [6:28.274] [7:88.004] [N/A:N/A] Volume (cm) : [6:2.827] [7:8.8] [N/A:N/A] % Reduction in Area: [6:0.00%] [7:0.00%] [N/A:N/A] % Reduction in Volume: 0.00% [7:0.00%] [N/A:N/A] Classification: [6:Partial Thickness] [7:Partial Thickness] [N/A:N/A] Exudate Amount: [6:Medium] [7:Medium] [N/A:N/A] Exudate Type: [6:Serosanguineous] [7:Serosanguineous] [N/A:N/A] Exudate Color: [6:red, brown] [7:red, brown] [N/A:N/A] Wound Margin: [6:Distinct, outline attached] [7:Indistinct, nonvisible] [N/A:N/A] Granulation Amount: [6:Medium (34-66%)] [7:Medium  (34-66%)] [N/A:N/A] Granulation Quality: [6:Red, Pink] [7:Red, Pink] [N/A:N/A] Necrotic Amount: [6:Medium (34-66%)] [7:Medium (34-66%)] [N/A:N/A] Exposed Structures: [6:Fascia: No Fat: No Tendon: No Muscle: No Joint: No] [7:Fascia: No Fat: No Tendon: No Muscle: No Joint: No] [N/A:N/A] Bone: No Bone: No Limited to Skin Limited to Skin Breakdown Breakdown Epithelialization: None None N/A Periwound Skin Texture: Edema: Yes Edema: Yes N/A Excoriation: No Excoriation: No Induration: No Induration: No Callus: No Callus: No Crepitus: No Crepitus: No Fluctuance: No Fluctuance: No Friable: No Friable: No Rash: No Rash: No Scarring: No Scarring: No Periwound Skin Moist: Yes Moist: Yes N/A Moisture: Maceration: No Maceration: No Dry/Scaly: No Dry/Scaly: No Periwound Skin Color: Mottled: Yes Mottled: Yes N/A Pallor: Yes Pallor: Yes Atrophie Blanche: No Atrophie Blanche: No Cyanosis: No Cyanosis: No Ecchymosis: No Ecchymosis: No Erythema: No Erythema: No Hemosiderin Staining: No Hemosiderin Staining: No Rubor: No Rubor: No Temperature: No Abnormality No Abnormality N/A Tenderness on Yes Yes N/A Palpation: Wound Preparation: Topical Anesthetic Ulcer Cleansing: N/A Applied: Other: lidocaine Rinsed/Irrigated with 4% Saline Topical Anesthetic Applied: Other: lidocaine 4% Treatment Notes Electronic Signature(s) Signed: 04/16/2015 10:54:43 AM By: Regan Lemming BSN, RN Entered By: Regan Lemming on 04/16/2015 10:54:43 [name] ([id number]) -------------------------------------------------------------------------------- [place] Details Patient Name: [name], [name]. Date of Service: 04/16/2015 10:15 AM Medical Record Number: [id number] Patient Account Number: [id number] Date of Birth/Sex: [birth year] (81 y.o. Female) Treating RN: Afful, RN, BSN, Velva Harman Primary Care Physician: Viviana Simpler Other Clinician: Referring Physician: Viviana Simpler Treating Physician/Extender: Frann Rider in Treatment: 0 Active Inactive Orientation to the Wound Care Program Nursing Diagnoses: Knowledge deficit related to the wound healing center program Goals: Patient/caregiver will verbalize understanding of the [place] Program Date Initiated: 04/16/2015 Goal Status: Active Interventions: Provide education on orientation to the wound center Notes: Venous Leg Ulcer Nursing Diagnoses: Potential for venous Insuffiency (use before diagnosis confirmed) Goals: Patient will maintain optimal edema control Date Initiated: 04/16/2015 Goal Status: Active Patient/caregiver will verbalize understanding of disease process and disease management Date Initiated: 04/16/2015 Goal Status: Active Verify adequate tissue perfusion prior to therapeutic compression application Date Initiated: 04/16/2015 Goal Status: Active Interventions: Assess peripheral edema status every visit. Compression as ordered Provide education on venous insufficiency Notes: [name], [name] ([id number]) Wound/Skin Impairment Nursing Diagnoses: Impaired tissue integrity Knowledge deficit related to ulceration/compromised skin integrity Goals: Patient/caregiver will verbalize understanding of skin care regimen Date Initiated: 04/16/2015 Goal Status: Active Ulcer/skin breakdown will have a volume reduction of 30% by week 4 Date Initiated: 04/16/2015 Goal Status: Active Ulcer/skin breakdown will have a volume reduction of 50% by week 8 Date Initiated: 04/16/2015 Goal Status: Active Ulcer/skin breakdown will have a volume reduction of 80% by week 12 Date Initiated: 04/16/2015 Goal Status: Active Ulcer/skin breakdown will heal within 14 weeks Date Initiated: 04/16/2015 Goal Status: Active Interventions: Assess patient/caregiver ability to obtain necessary supplies Assess patient/caregiver ability to perform ulcer/skin care regimen upon admission  and as needed Assess ulceration(s) every visit Provide education on ulcer and skin care Treatment Activities: Skin care regimen initiated : 04/16/2015 Topical wound management  initiated : 04/16/2015 Notes: Electronic Signature(s) Signed: 04/16/2015 10:54:25 AM By: Regan Lemming BSN, RN Entered By: Regan Lemming on 04/16/2015 10:54:24 [name] ([id number]) -------------------------------------------------------------------------------- Pain Assessment Details Patient Name: [name]. Date of Service: 04/16/2015 10:15 AM Medical Record Number: [id number] Patient Account Number: [id number] Date of Birth/Sex: [birth year] (81 y.o. Female) Treating RN: Baruch Gouty, RN, BSN, Velva Harman Primary Care Physician: Viviana Simpler Other Clinician: Referring Physician: Viviana Simpler Treating Physician/Extender: Frann Rider in Treatment: 0 Active Problems Location of Pain Severity and Description of Pain Patient Has Paino Yes Site Locations Pain Location: Generalized Pain, Pain in Ulcers Rate the pain. Current Pain Level: 4 Worst Pain Level: 5 Pain Management and Medication Current Pain Management: How does your pain impact your activities of daily livingo Sleep: Yes Bathing: Yes Appetite: Yes Relationship With Others: Yes Bladder Continence: Yes Emotions: Yes Bowel Continence: Yes Work: Yes Toileting: Yes Drive: Yes Dressing: Yes Hobbies: Yes Electronic Signature(s) Signed: 04/16/2015 10:30:04 AM By: Regan Lemming BSN, RN Previous Signature: 04/16/2015 10:29:38 AM Version By: Regan Lemming BSN, RN Entered By: Regan Lemming on 04/16/2015 10:30:04 [name] ([id number]) -------------------------------------------------------------------------------- Patient/Caregiver Education Details Patient Name: [name] Date of Service: 04/16/2015 10:15 AM Medical Record Number: [id number] Patient Account Number: [id number] Date of Birth/Gender: 1926/12/25 (81 y.o.  Female) Treating RN: Baruch Gouty, RN, BSN, Velva Harman Primary Care Physician: Viviana Simpler Other Clinician: Referring Physician: Viviana Simpler Treating Physician/Extender: Frann Rider in Treatment: 0 Education Assessment Education Provided To: Patient Education Topics Provided Basic Hygiene: Methods: Explain/Verbal Responses: State content correctly Venous: Methods: Explain/Verbal Responses: State content correctly Welcome To [facility] [place]: Methods: Explain/Verbal Responses: State content correctly Wound/Skin Impairment: Methods: Explain/Verbal Responses: State content correctly Electronic Signature(s) Signed: 04/16/2015 11:21:16 AM By: Regan Lemming BSN, RN Entered By: Regan Lemming on 04/16/2015 11:21:16 [name] ([id number]) -------------------------------------------------------------------------------- Wound Assessment Details Patient Name: [name]. Date of Service: 04/16/2015 10:15 AM Medical Record Number: [id number] Patient Account Number: [id number] Date of Birth/Sex: 1927/03/06 (81 y.o. Female) Treating RN: Afful, RN, BSN, [place] Primary Care Physician: Viviana Simpler Other Clinician: Referring Physician: Viviana Simpler Treating Physician/Extender: Frann Rider in Treatment: 0 Wound Status Wound Number: 6 Primary Venous Leg Ulcer Etiology: Wound Location: Left Lower Leg - Medial Wound Open Wounding Event: Gradually Appeared Status: Date Acquired: 04/07/2015 Comorbid Cataracts, Anemia, Arrhythmia, Weeks Of Treatment: 0 History: Rheumatoid Arthritis, Osteoarthritis, Clustered Wound: No Received Chemotherapy Photos Photo Uploaded By: Regan Lemming on 04/16/2015 16:31:37 Wound Measurements Length: (cm) 9 Width: (cm) 4 Depth: (cm) 0.1 Area: (cm) 28.274 Volume: (cm) 2.827 % Reduction in Area: 0% % Reduction in Volume: 0% Epithelialization: None Tunneling: No Undermining: No Wound Description Classification: Partial  Thickness Wound Margin: Distinct, outline attached Exudate Amount: Medium Exudate Type: Serosanguineous Exudate Color: red, brown Foul Odor After Cleansing: No Wound Bed Granulation Amount: Medium (34-66%) Exposed Structure Granulation Quality: Red, Pink Fascia Exposed: No Necrotic Amount: Medium (34-66%) Fat Layer Exposed: No Necrotic Quality: Adherent Slough Tendon Exposed: No [name], [name] ([id number]) Muscle Exposed: No Joint Exposed: No Bone Exposed: No Limited to Skin Breakdown Periwound Skin Texture Texture Color No Abnormalities Noted: No No Abnormalities Noted: No Callus: No Atrophie Blanche: No Crepitus: No Cyanosis: No Excoriation: No Ecchymosis: No Fluctuance: No Erythema: No Friable: No Hemosiderin Staining: No Induration: No Mottled: Yes Localized Edema: Yes Pallor: Yes Rash: No Rubor: No Scarring: No Temperature / Pain Moisture Temperature: No Abnormality No Abnormalities Noted: No Tenderness on Palpation: Yes Dry / Scaly: No Maceration: No  Moist: Yes Wound Preparation Topical Anesthetic Applied: Other: lidocaine 4%, Treatment Notes Wound #6 (Left, Medial Lower Leg) 1. Cleansed with: Clean wound with Normal Saline 4. Dressing Applied: Aquacel Ag 5. Secondary Dressing Applied Gauze and Kerlix/Conform 7. Secured with Financial risk analyst) Signed: 04/16/2015 10:39:52 AM By: Regan Lemming BSN, RN Entered By: Regan Lemming on 04/16/2015 10:39:52 [name] ([id number]) -------------------------------------------------------------------------------- Wound Assessment Details Patient Name: [name]. Date of Service: 04/16/2015 10:15 AM Medical Record Number: [id number] Patient Account Number: [id number] Date of Birth/Sex: 1927-01-02 (81 y.o. Female) Treating RN: Afful, RN, BSN, [place] Primary Care Physician: Viviana Simpler Other Clinician: Referring Physician: Viviana Simpler Treating Physician/Extender: Frann Rider in Treatment: 0 Wound Status Wound Number: 7 Primary Venous Leg Ulcer Etiology: Wound Location: Right Lower Leg - Medial Wound Open Wounding Event: Gradually Appeared Status: Date Acquired: 04/07/2015 Comorbid Cataracts, Anemia, Arrhythmia, Weeks Of Treatment: 0 History: Rheumatoid Arthritis, Osteoarthritis, Clustered Wound: No Received Chemotherapy Photos Photo Uploaded By: Regan Lemming on 04/16/2015 16:32:11 Wound Measurements Length: (cm) 13.5 Width: (cm) 8.3 Depth: (cm) 0.1 Area: (cm) 88.004 Volume: (cm) 8.8 % Reduction in Area: 0% % Reduction in Volume: 0% Epithelialization: None Tunneling: No Undermining: No Wound Description Classification: Partial Thickness Wound Margin: Indistinct, nonvisible Exudate Amount: Medium Exudate Type: Serosanguineous Exudate Color: red, brown Foul Odor After Cleansing: No Wound Bed Granulation Amount: Medium (34-66%) Exposed Structure Granulation Quality: Red, Pink Fascia Exposed: No Necrotic Amount: Medium (34-66%) Fat Layer Exposed: No Necrotic Quality: Adherent Slough Tendon Exposed: No [name], [name] ([id number]) Muscle Exposed: No Joint Exposed: No Bone Exposed: No Limited to Skin Breakdown Periwound Skin Texture Texture Color No Abnormalities Noted: No No Abnormalities Noted: No Callus: No Atrophie Blanche: No Crepitus: No Cyanosis: No Excoriation: No Ecchymosis: No Fluctuance: No Erythema: No Friable: No Hemosiderin Staining: No Induration: No Mottled: Yes Localized Edema: Yes Pallor: Yes Rash: No Rubor: No Scarring: No Temperature / Pain Moisture Temperature: No Abnormality No Abnormalities Noted: No Tenderness on Palpation: Yes Dry / Scaly: No Maceration: No Moist: Yes Wound Preparation Ulcer Cleansing: Rinsed/Irrigated with Saline Topical Anesthetic Applied: Other: lidocaine 4%, Treatment Notes Wound #7 (Right, Medial Lower Leg) 1. Cleansed with: Clean wound with Normal  Saline 4. Dressing Applied: Aquacel Ag 5. Secondary Dressing Applied Gauze and Kerlix/Conform 7. Secured with Financial risk analyst) Signed: 04/16/2015 10:42:36 AM By: Regan Lemming BSN, RN Entered By: Regan Lemming on 04/16/2015 10:42:36 [name], [name] ([id number]) -------------------------------------------------------------------------------- Vitals Details Patient Name: [name] Date of Service: 04/16/2015 10:15 AM Medical Record Number: [id number] Patient Account Number: [id number] Date of Birth/Sex: 1927-05-13 (81 y.o. Female) Treating RN: Afful, RN, BSN, [place] Primary Care Physician: Viviana Simpler Other Clinician: Referring Physician: Viviana Simpler Treating Physician/Extender: Frann Rider in Treatment: 0 Vital Signs Time Taken: 10:30 Temperature (F): 97.8 Height (in): 62 Pulse (bpm): 79 Source: Stated Respiratory Rate (breaths/min): 18 Weight (lbs): 115 Blood Pressure (mmHg): 124/58 Source: Measured Reference Range: 80 - 120 mg / dl Body Mass Index (BMI): 21 Electronic Signature(s) Signed: 04/16/2015 11:35:30 AM By: Regan Lemming BSN, RN Previous Signature: 04/16/2015 10:30:40 AM Version By: Regan Lemming BSN, RN Entered By: Regan Lemming on 04/16/2015 11:35:30

## 2015-04-17 ENCOUNTER — Ambulatory Visit: Payer: Medicare Other | Admitting: Surgery

## 2015-04-17 NOTE — Progress Notes (Signed)
[name] ([id number]) Visit Report for 04/16/2015 Chief Complaint Document Details Patient Name: [name], [name] 04/16/2015 10:15 Date of Service: AM Medical Record [id number] Number: Patient Account Number: [id number] 07/11/1926 (81 y.o. Treating RN: Baruch Gouty, RN, BSN, Velva Harman Date of Birth/Sex: Female) Other Clinician: Primary Care Physician: Viviana Simpler Treating BURNS III, Thayer Jew Referring Physician: Viviana Simpler Physician/Extender: Weeks in Treatment: 0 Information Obtained from: Patient Chief Complaint Bilateral calf ulcerations since early October 2016. Electronic Signature(s) Signed: 04/16/2015 3:35:09 PM By: Loletha Grayer MD Entered By: Loletha Grayer on 04/16/2015 13:46:07 Eisler, Leward Quan ([id number]) -------------------------------------------------------------------------------- Debridement Details Patient Name: [name] 04/16/2015 10:15 Date of Service: AM Medical Record [id number] Number: Patient Account Number: [id number] February 13, 1927 (81 y.o. Treating RN: Baruch Gouty, RN, BSN, Velva Harman Date of Birth/Sex: Female) Other Clinician: Primary Care Physician: Jose Persia Referring Physician: Viviana Simpler Physician/Extender: Weeks in Treatment: 0 Debridement Performed for Wound #6 Left,Medial Lower Leg Assessment: Performed By: Physician BURNS III, Teressa Senter., MD Debridement: Open Wound/Selective Debridement Selective Description: Pre-procedure Yes Verification/Time Out Taken: Start Time: 10:30 Pain Control: Lidocaine 4% Topical Solution Level: Non-Viable Tissue Total Area Debrided (L x 9 (cm) x 4 (cm) = 36 (cm) W): Tissue and other Non-Viable, Fibrin/Slough material debrided: Bleeding: Minimum Hemostasis Achieved: Pressure End Time: 10:32 Procedural Pain: 5 Post Procedural Pain: 5 Response to Treatment: Procedure was tolerated well Post Debridement Measurements of Total Wound Length: (cm) 9 Width: (cm)  4 Depth: (cm) 0.2 Volume: (cm) 5.655 Post Procedure Diagnosis Same as Pre-procedure Electronic Signature(s) Signed: 04/16/2015 3:35:09 PM By: Loletha Grayer MD Signed: 04/16/2015 5:06:41 PM By: Regan Lemming BSN, RN Signed: 04/17/2015 8:02:47 AM By: Christin Fudge MD, FACS Entered By: Loletha Grayer on 04/16/2015 13:36:51 Schupp, Leward Quan ([id number], DUVALL ([id number]) -------------------------------------------------------------------------------- Debridement Details Patient Name: [name] 04/16/2015 10:15 Date of Service: AM Medical Record [id number] Number: Patient Account Number: [id number] [birth year] (81 y.o. Treating RN: Afful, RN, BSN, Velva Harman Date of Birth/Sex: Female) Other Clinician: Primary Care Physician: Quinn Axe, Buffie Herne Referring Physician: Viviana Simpler Physician/Extender: Weeks in Treatment: 0 Debridement Performed for Wound #7 Right,Medial Lower Leg Assessment: Performed By: Physician BURNS III, Teressa Senter., MD Debridement: Open Wound/Selective Debridement Selective Description: Pre-procedure Yes Verification/Time Out Taken: Start Time: 10:30 Pain Control: Lidocaine 4% Topical Solution Level: Non-Viable Tissue Total Area Debrided (L x 13.5 (cm) x 8.3 (cm) = 112.05 (cm) W): Tissue and other Non-Viable, Fibrin/Slough material debrided: Bleeding: Minimum Hemostasis Achieved: Pressure End Time: 10:32 Procedural Pain: 5 Post Procedural Pain: 5 Response to Treatment: Procedure was tolerated well Post Debridement Measurements of Total Wound Length: (cm) 13.5 Width: (cm) 8.3 Depth: (cm) 0.2 Volume: (cm) 17.601 Post Procedure Diagnosis Same as Pre-procedure Notes Culture obtained. Electronic Signature(s) Signed: 04/16/2015 3:35:09 PM By: Loletha Grayer MD Signed: 04/16/2015 5:06:41 PM By: Regan Lemming BSN, RN KATRIN, GRABEL ([id number]) Signed: 04/17/2015 8:02:47 AM By: Christin Fudge MD,  FACS Entered By: Loletha Grayer on 04/16/2015 13:37:32 Jearldine, Cassady Leward Quan ([id number]) -------------------------------------------------------------------------------- HPI Details Patient Name: [name] 04/16/2015 10:15 Date of Service: AM Medical Record [id number] Number: Patient Account Number: [id number] 03-03-1927 (81 y.o. Treating RN: Baruch Gouty, RN, BSN, Velva Harman Date of Birth/Sex: Female) Other Clinician: Primary Care Physician: Viviana Simpler Treating BURNS III, WALTER Referring Physician: Viviana Simpler Physician/Extender: Weeks in Treatment: 0 History of Present Illness Location: right upper forehead near her scalp and hairline Quality: Patient reports experiencing a dull pain to affected area(s). Severity: Patient states wound (s)  are getting better. Duration: Patient has had the wound for 3 days prior to presenting for treatment Timing: Pain in wound is Intermittent (comes and goes Context: she tripped over something and fell and hit her head against the hard object. Modifying Factors: not had any loss of consciousness, vision ranges or any persistent headaches. Associated Signs and Symptoms: no nausea vomiting HPI Description: Pleasant 81 year old with history of chronic venous insufficiency, atrial fibrillation (on Eloquis), COPD, and chronic kidney disease. She developed bilateral calf ulcerations in early October 2016. Worse with antibiotic cream. Usually wears compression stockings but has not been wearing them since she developed the ulcerations. No recent ultrasound. She reports significant pain with pressure. No ischemic rest pain or claudication. No fever or chills. Minimal serosanguineous drainage. Electronic Signature(s) Signed: 04/16/2015 3:35:09 PM By: Loletha Grayer MD Entered By: Loletha Grayer on 04/16/2015 13:45:57 Kemp, Leward Quan ([id number]) -------------------------------------------------------------------------------- Physical Exam  Details Patient Name: [name] 04/16/2015 10:15 Date of Service: AM Medical Record [id number] Number: Patient Account Number: [id number] Apr 26, 1927 (81 y.o. Treating RN: Baruch Gouty, RN, BSN, Velva Harman Date of Birth/Sex: Female) Other Clinician: Primary Care Physician: Jose Persia Referring Physician: Viviana Simpler Physician/Extender: Weeks in Treatment: 0 Constitutional . Pulse regular. Respirations normal and unlabored. Afebrile. Marland Kitchen Respiratory WNL. No retractions.. Cardiovascular Heart rhythm and rate regular, no murmur or gallop.. Pedal Pulses WNL. Integumentary (Hair, Skin) .Marland Kitchen Neurological Sensation normal to touch, pin,and vibration. Psychiatric Judgement and insight Intact.. Oriented times 3.. No evidence of depression, anxiety, or agitation.. Notes Extensive bilateral medial calf ulcerations. Partial to full-thickness. Mild surrounding cellulitis. Hyperpigmentation consistent with chronic venous stasis disease. 1+ pitting edema. Palpable DP bilaterally. ABIs not obtained secondary to pain. Electronic Signature(s) Signed: 04/16/2015 3:35:09 PM By: Loletha Grayer MD Signed: 04/17/2015 8:02:47 AM By: Christin Fudge MD, FACS Entered By: Loletha Grayer on 04/16/2015 13:42:58 [name] ([id number]) -------------------------------------------------------------------------------- Physician Orders Details Patient Name: MERRISSA, GIACOBBE 04/16/2015 10:15 Date of Service: AM Medical Record [id number] Number: Patient Account Number: [id number] 1927/03/14 (81 y.o. Treating RN: Baruch Gouty, RN, BSN, Velva Harman Date of Birth/Sex: Female) Other Clinician: Primary Care Physician: Viviana Simpler Treating BURNS III, WALTER Referring Physician: Viviana Simpler Physician/Extender: Weeks in Treatment: 0 Verbal / Phone Orders: Yes Clinician: Afful, RN, BSN, Rita Read Back and Verified: Yes Diagnosis Coding Wound Cleansing Wound #6 Left,Medial Lower  Leg o Clean wound with Normal Saline. Wound #7 Right,Medial Lower Leg o Clean wound with Normal Saline. Primary Wound Dressing Wound #6 Left,Medial Lower Leg o Aquacel Ag Wound #7 Right,Medial Lower Leg o Aquacel Ag Secondary Dressing Wound #6 Left,Medial Lower Leg o Gauze and Kerlix/Conform Wound #7 Right,Medial Lower Leg o Gauze and Kerlix/Conform Dressing Change Frequency Wound #6 Left,Medial Lower Leg o Change dressing every other day. Wound #7 Right,Medial Lower Leg o Change dressing every other day. Follow-up Appointments Wound #6 Left,Medial Lower Leg o Return Appointment in 1 week. Wound #7 Right,Medial Lower Leg o Return Appointment in 1 week. TEKESHIA, KLAHR ([id number]) Edema Control Wound #6 Left,Medial Lower Leg o Patient to wear own Juxtalite/Juzo compression garment. Wound #7 Right,Medial Lower Leg o Patient to wear own Juxtalite/Juzo compression garment. Laboratory o Culture and Sensitivity - right lower leg oooo Patient Medications Allergies: Penicillins, sulfa drugs, adhesive, Pradaxa, Ativan, Coumadin Notifications Medication Indication Start End doxycycline hyclate BLE cellulitis 04/16/2015 DOSE oral 100 mg capsule - 100mg  po bid Electronic Signature(s) Signed: 04/16/2015 11:05:53 AM By: Loletha Grayer MD Previous Signature: 04/16/2015 11:02:58 AM Version  By: Regan Lemming BSN, RN Entered By: Loletha Grayer on 04/16/2015 11:05:52 [name] ([id number]) -------------------------------------------------------------------------------- Problem List Details Patient Name: ODILIA, DAMICO 04/16/2015 10:15 Date of Service: AM Medical Record [id number] Number: Patient Account Number: [id number] 02-Sep-1926 (81 y.o. 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 04/16/2015 Yes calf and inflammation I83.222 Varicose veins of left lower extremity with both ulcer of 04/16/2015 Yes calf and inflammation L03.115 Cellulitis of right lower limb 04/16/2015 Yes L03.116 Cellulitis of left lower limb 04/16/2015 Yes I48.2 Chronic atrial fibrillation 04/16/2015 Yes I87.2 Venous insufficiency (chronic) (peripheral) 04/16/2015 Yes J44.9 Chronic obstructive pulmonary disease, unspecified 04/16/2015 Yes N18.9 Chronic kidney disease, unspecified 04/16/2015 Yes Inactive Problems Resolved Problems Electronic Signature(s) BERNISE, SYLVAIN ([id number]) Signed: 04/16/2015 3:35:09 PM By: Loletha Grayer MD Entered By: Loletha Grayer on 04/16/2015 13:41:27 Mckethan, Leward Quan ([id number]) -------------------------------------------------------------------------------- Progress Note/History and Physical Details Patient Name: MALEIYA, PERGOLA 04/16/2015 10:15 Date of Service: AM Medical Record [id number] Number: Patient Account Number: [id number] 09/01/1926 (81 y.o. Treating RN: Baruch Gouty, RN, BSN, Velva Harman Date of Birth/Sex: Female) Other Clinician: Primary Care Physician: Viviana Simpler Treating BURNS III, Thayer Jew Referring Physician: Viviana Simpler Physician/Extender: Weeks in Treatment: 0 Subjective Chief Complaint Information obtained from Patient Bilateral calf ulcerations since early October 2016. History of Present Illness (HPI) The following HPI elements were documented for the patient's wound: Location: right upper forehead near her scalp and hairline Quality: Patient reports experiencing a dull pain to affected area(s). Severity: Patient states wound (s) are getting better. Duration: Patient has had the wound for 3 days prior to presenting for  treatment Timing: Pain in wound is Intermittent (comes and goes Context: she tripped over something and fell and hit her head against the hard object. Modifying Factors: not had any loss of consciousness, vision ranges or any persistent headaches. Associated Signs and Symptoms: no nausea vomiting Pleasant 81 year old with history of chronic venous insufficiency, atrial fibrillation (on Eloquis), COPD, and chronic kidney disease. She developed bilateral calf ulcerations in early October 2016. Worse with antibiotic cream. Usually wears compression stockings but has not been wearing them since she developed the ulcerations. No recent ultrasound. She reports significant pain with pressure. No ischemic rest pain or claudication. No fever or chills. Minimal serosanguineous drainage. Wound History Patient reportedly has not tested positive for osteomyelitis. Patient reportedly has not had testing performed to evaluate circulation in the legs. Patient History Information obtained from Patient. Allergies Penicillins (Severity: Severe, Reaction: anaphylaxis), sulfa drugs (Severity: Severe, Reaction: anaphylaxis), adhesive (Severity: Severe, Reaction: blistering), Pradaxa (Severity: Severe, Reaction: made her crazy), Ativan (Reaction: disoriented), Coumadin RAKEISHA, NYCE ([id number]) Family History Cancer - Father, Hereditary Spherocytosis - Mother, Hypertension - Mother, No family history of Diabetes, Heart Disease, Kidney Disease, Lung Disease, Seizures, Stroke, Thyroid Problems, Tuberculosis. Social History Former smoker, Marital Status - Married, Alcohol Use - Daily, Drug Use - No History, Caffeine Use - Daily - coffee. Medical History Eyes Patient has history of Cataracts - lens implant Hematologic/Lymphatic Patient has history of Anemia Cardiovascular Patient has history of Arrhythmia - A-fib Integumentary (Skin) Denies history of History of Burn, History of pressure  wounds Musculoskeletal Patient has history of Rheumatoid Arthritis, Osteoarthritis Oncologic Patient has history of Received Chemotherapy - methotrexate Denies history  of Received Radiation Medical And Surgical History Notes Gastrointestinal GI bleed Endocrine thryoid Musculoskeletal back pain Review of Systems (ROS) Eyes The patient has no complaints or symptoms. Hematologic/Lymphatic The patient has no complaints or symptoms. Cardiovascular The patient has no complaints or symptoms. Integumentary (Skin) Complains or has symptoms of Wounds, Bleeding or bruising tendency. Musculoskeletal The patient has no complaints or symptoms. Oncologic The patient has no complaints or symptoms. RYLIN, SAEZ ([id number]) Objective Constitutional Pulse regular. Respirations normal and unlabored. Afebrile. Vitals Time Taken: 10:30 AM, Height: 62 in, Source: Stated, Weight: 115 lbs, Source: Measured, BMI: 21, Temperature: 97.8 F, Pulse: 79 bpm, Respiratory Rate: 18 breaths/min, Blood Pressure: 124/58 mmHg. Respiratory WNL. No retractions.. Cardiovascular Heart rhythm and rate regular, no murmur or gallop.. Pedal Pulses WNL. Neurological Sensation normal to touch, pin,and vibration. Psychiatric Judgement and insight Intact.. Oriented times 3.. No evidence of depression, anxiety, or agitation.. General Notes: Extensive bilateral medial calf ulcerations. Partial to full-thickness. Mild surrounding cellulitis. Hyperpigmentation consistent with chronic venous stasis disease. 1+ pitting edema. Palpable DP bilaterally. ABIs not obtained secondary to pain. Integumentary (Hair, Skin) Wound #6 status is Open. Original cause of wound was Gradually Appeared. The wound is located on the Left,Medial Lower Leg. The wound measures 9cm length x 4cm width x 0.1cm depth; 28.274cm^2 area and 2.827cm^3 volume. The wound is limited to skin breakdown. There is no tunneling or undermining noted. There is  a medium amount of serosanguineous drainage noted. The wound margin is distinct with the outline attached to the wound base. There is medium (34-66%) red, pink granulation within the wound bed. There is a medium (34-66%) amount of necrotic tissue within the wound bed including Adherent Slough. The periwound skin appearance exhibited: Localized Edema, Moist, Mottled, Pallor. The periwound skin appearance did not exhibit: Callus, Crepitus, Excoriation, Fluctuance, Friable, Induration, Rash, Scarring, Dry/Scaly, Maceration, Atrophie Blanche, Cyanosis, Ecchymosis, Hemosiderin Staining, Rubor, Erythema. Periwound temperature was noted as No Abnormality. The periwound has tenderness on palpation. Wound #7 status is Open. Original cause of wound was Gradually Appeared. The wound is located on the Right,Medial Lower Leg. The wound measures 13.5cm length x 8.3cm width x 0.1cm depth; 88.004cm^2 area and 8.8cm^3 volume. The wound is limited to skin breakdown. There is no tunneling or undermining noted. There is a medium amount of serosanguineous drainage noted. The wound margin is indistinct and nonvisible. There is medium (34-66%) red, pink granulation within the wound bed. There is a medium (34- 66%) amount of necrotic tissue within the wound bed including Adherent Slough. The periwound skin appearance exhibited: Localized Edema, Moist, Mottled, Pallor. The periwound skin appearance did not exhibit: Callus, Crepitus, Excoriation, Fluctuance, Friable, Induration, Rash, Scarring, Dry/Scaly, Maceration, Atrophie Blanche, Cyanosis, Ecchymosis, Hemosiderin Staining, Rubor, Erythema. MADISON, DIRENZO. ([id number]) temperature was noted as No Abnormality. The periwound has tenderness on palpation. 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 Cellulitis. Procedures Wound #6 Wound #6 is a Venous Leg Ulcer located on the Left,Medial Lower Leg . There was a Non-Viable Tissue Open Wound/Selective [phone]) debridement with total area of 36 sq  cm performed by BURNS III, Teressa Senter., MD. to remove Non-Viable tissue/material including Fibrin/Slough after achieving pain control using Lidocaine 4% Topical Solution. A time out was conducted prior to the start of the procedure. A Minimum amount of bleeding was controlled with Pressure. The procedure was tolerated well with a pain level of 5 throughout and a pain level of 5 following the procedure. Post Debridement Measurements: 9cm length x 4cm width x 0.2cm depth; 5.655cm^3 volume. Post procedure Diagnosis Wound #6: Same as Pre-Procedure Wound #7 Wound #7 is a Venous Leg Ulcer located on the Right,Medial Lower Leg . There was a Non-Viable Tissue Open Wound/Selective [phone]) debridement with total area of 112.05 sq cm performed by BURNS III, Teressa Senter., MD. to remove Non-Viable tissue/material including Fibrin/Slough after achieving pain control using Lidocaine 4% Topical Solution. A time out was conducted prior to the start of the procedure. A Minimum amount of bleeding was controlled with Pressure. The procedure was tolerated well with a pain level of 5 throughout and a pain level of 5 following the procedure. Post Debridement Measurements: 13.5cm length x 8.3cm width x 0.2cm depth; 17.601cm^3 volume. Post procedure Diagnosis Wound #7: Same as Pre-Procedure BRANDICE, BUSSER ([id number]) General Notes: Culture obtained.. Plan Wound  Cleansing: Wound #6 Left,Medial Lower Leg: Clean wound with Normal Saline. Wound #7 Right,Medial Lower Leg: Clean wound with Normal Saline. Primary Wound Dressing: Wound #6 Left,Medial Lower Leg: Aquacel Ag Wound #7 Right,Medial Lower Leg: Aquacel Ag Secondary Dressing: Wound #6 Left,Medial Lower Leg: Gauze and Kerlix/Conform Wound #7 Right,Medial Lower Leg: Gauze and Kerlix/Conform Dressing Change Frequency: Wound #6 Left,Medial Lower Leg: Change dressing every other day. Wound #7 Right,Medial Lower Leg: Change dressing every other day. Follow-up Appointments: Wound #6 Left,Medial Lower Leg: Return Appointment in 1 week. Wound #7 Right,Medial Lower Leg: Return Appointment in 1 week. Edema Control: Wound #6 Left,Medial Lower Leg: Patient to wear own Juxtalite/Juzo compression garment. Wound #7 Right,Medial Lower Leg: Patient to wear own Juxtalite/Juzo compression garment. Laboratory ordered were: Culture and Sensitivity - right lower leg The following medication(s) was prescribed: doxycycline hyclate oral 100 mg capsule 100mg  po bid for BLE cellulitis starting 04/16/2015 VEOLA, CAFARO ([id number]) Silver alginate daily to every other day. Doxycycline. Follow up on cultures obtained today. Order juxtalite. Tubigrip for edema control for now. Order venous ultrasound. Electronic Signature(s) Signed: 04/16/2015 3:35:09 PM By: Loletha Grayer MD Entered By: Loletha Grayer on 04/16/2015 13:46:30 Heidel, Leward Quan ([id number]) -------------------------------------------------------------------------------- ROS/PFSH Details Patient Name: CIELLE, AGUILA 04/16/2015 10:15 Date of Service: AM Medical Record [id number] Number: Patient Account Number: [id number] Apr 24, 1927 (81 y.o. Treating RN: Afful, RN, BSN, Velva Harman Date of Birth/Sex: Female) Other Clinician: Primary Care Physician: Viviana Simpler Treating BURNS III, WALTER Referring Physician: Viviana Simpler  Physician/Extender: Weeks in Treatment: 0 Label Progress Note Print Version as History and Physical for this encounter Information Obtained From Patient Wound History Do you currently have one or more open woundso Yes Has your wound(s) ever healed and then re-openedo No Have you had any lab work done in the past montho No Have you tested positive for an antibiotic resistant organism (MRSA, VRE)o No Have you tested positive for osteomyelitis (bone infection)o No Have you had any tests for circulation on your legso No Integumentary (Skin) Complaints and Symptoms: Positive for: Wounds; Bleeding or bruising tendency Medical History: Negative for: History of Burn; History of pressure wounds Eyes Complaints and Symptoms: No Complaints or Symptoms Medical History: Positive for: Cataracts - lens implant Hematologic/Lymphatic Complaints  and Symptoms: No Complaints or Symptoms Medical History: Positive for: Anemia Cardiovascular Complaints and Symptoms: No Complaints or Symptoms Medical History: IZADORA, ROEHR ([id number]) Positive for: Arrhythmia - A-fib Gastrointestinal Medical History: Past Medical History Notes: GI bleed Endocrine Medical History: Past Medical History Notes: thryoid Musculoskeletal Complaints and Symptoms: No Complaints or Symptoms Medical History: Positive for: Rheumatoid Arthritis; Osteoarthritis Past Medical History Notes: back pain Oncologic Complaints and Symptoms: No Complaints or Symptoms Medical History: Positive for: Received Chemotherapy - methotrexate Negative for: Received Radiation HBO Extended History Items Eyes: Cataracts Family and Social History Cancer: Yes - Father; Diabetes: No; Heart Disease: No; Hereditary Spherocytosis: Yes - Mother; Hypertension: Yes - Mother; Kidney Disease: No; Lung Disease: No; Seizures: No; Stroke: No; Thyroid Problems: No; Tuberculosis: No; Former smoker; Marital Status - Married; Alcohol Use: Daily;  Drug Use: No History; Caffeine Use: Daily - coffee; Financial Concerns: No; Food, Clothing or Shelter Needs: No; Support System Lacking: No; Transportation Concerns: No; Advanced Directives: Yes (Not Provided); Patient does not want information on Advanced Directives; Do not resuscitate: Yes (Copy provided); Living Will: Yes (Not Provided); Medical Power of Attorney: Yes - james Barto (Not Provided) Physician Affirmation I have reviewed and agree with the above information. Electronic Signature(s) Signed: 04/16/2015 3:35:09 PM By: Loletha Grayer MD [name] ([id number]) Signed: 04/16/2015 5:06:41 PM By: Regan Lemming BSN, RN Previous Signature: 04/16/2015 10:22:38 AM Version By: Regan Lemming BSN, RN Entered By: Loletha Grayer on 04/16/2015 11:06:45 Tutor, Leward Quan ([id number]) -------------------------------------------------------------------------------- SuperBill Details Patient Name: JANASIA, COVERDALE. Date of Service: 04/16/2015 Medical Record Number: [id number] Patient Account Number: [id number] Date of Birth/Sex: 06-23-1927 (81 y.o. 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 L03.115 Cellulitis of right lower limb L03.116 Cellulitis of left lower limb I48.2 Chronic atrial fibrillation I87.2 Venous insufficiency (chronic) (peripheral) J44.9 Chronic obstructive pulmonary disease, unspecified N18.9 Chronic kidney disease, unspecified Facility Procedures CPT4: Description Modifier Quantity Code [id number] 99213 - WOUND CARE VISIT-LEV 3 EST PT 1 CPT4: 31497026 97597 - DEBRIDE WOUND 1ST  20 SQ CM OR < 1 ICD-10 Description Diagnosis I83.212 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

## 2015-04-22 ENCOUNTER — Encounter: Payer: Self-pay | Admitting: Cardiology

## 2015-04-23 ENCOUNTER — Telehealth: Payer: Self-pay | Admitting: Internal Medicine

## 2015-04-23 ENCOUNTER — Encounter: Payer: Medicare Other | Admitting: Surgery

## 2015-04-23 DIAGNOSIS — L97221 Non-pressure chronic ulcer of left calf limited to breakdown of skin: Secondary | ICD-10-CM | POA: Diagnosis not present

## 2015-04-23 LAB — WOUND CULTURE

## 2015-04-23 NOTE — Progress Notes (Addendum)
Jacqueline, [name] ([id number]) Visit Report for 04/23/2015 Chief Complaint Document Details Patient Name: [name], [name]. Date of Service: 04/23/2015 1:45 PM Medical Record Number: [id number] Patient Account Number: [id number] Date of Birth/Sex: Dec 01, 1926 (81 y.o. Female) Treating RN: Baruch Gouty, RN, BSN, Velva Harman Primary Care Physician: Viviana Simpler Other Clinician: Referring Physician: Viviana Simpler Treating Physician/Extender: BURNS III, Charlean Sanfilippo in Treatment: 1 Information Obtained from: Patient Chief Complaint Bilateral calf ulcerations since early October 2016. Electronic Signature(s) Signed: 04/23/2015 3:24:17 PM By: Loletha Grayer MD Entered By: Loletha Grayer on 04/23/2015 14:29:10 Yurko, Leward Quan ([id number]) -------------------------------------------------------------------------------- HPI Details Patient Name: Jacqueline, [name]. Date of Service: 04/23/2015 1:45 PM Medical Record Number: [id number] Patient Account Number: [id number] Date of Birth/Sex: 31-Mar-1927 (81 y.o. Female) Treating RN: Afful, RN, BSN, Velva Harman Primary Care Physician: Viviana Simpler Other Clinician: Referring Physician: Viviana Simpler Treating Physician/Extender: BURNS III, Charlean Sanfilippo in Treatment: 1 History of Present Illness HPI Description: Pleasant 81 year old with history of chronic venous insufficiency, atrial fibrillation (on Eloquis), COPD, and chronic kidney disease. No h/o DM or PAD. She developed bilateral calf ulcerations in early October 2016. Worse with antibiotic cream. Usually wears compression stockings but has not been wearing them since she developed the ulcerations. No recent ultrasound. She reports significant pain with pressure. No ischemic rest pain or claudication. Pain improved with leg elevation. Culture 04/16/2015 grew MRSA sensitive to tetracycline, clindamycin, and Cipro. She completed a one- week course of doxycycline and has experienced worsening itching.  She has been applying silver alginate and using a juxtalite for edema control. She returns to clinic for follow-up and says that her pain is worse. Improved with leg elevation. Itching is severe. No fever or chills. Minimal drainage. Electronic Signature(s) Signed: 04/23/2015 3:24:17 PM By: Loletha Grayer MD Entered By: Loletha Grayer on 04/23/2015 14:32:22 [name] ([id number]) -------------------------------------------------------------------------------- Physical Exam Details Patient Name: Jacqueline, [name]. Date of Service: 04/23/2015 1:45 PM Medical Record Number: [id number] Patient Account Number: [id number] Date of Birth/Sex: [birth year] (81 y.o. Female) Treating RN: Baruch Gouty, RN, BSN, Velva Harman Primary Care Physician: Viviana Simpler Other Clinician: Referring Physician: Viviana Simpler Treating Physician/Extender: BURNS III, Santiago Graf Weeks in Treatment: 1 Constitutional . Pulse regular. Respirations normal and unlabored. Afebrile. Marland Kitchen Respiratory WNL. No retractions.. Cardiovascular Pedal Pulses WNL. Integumentary (Hair, Skin) .Marland Kitchen Neurological Sensation normal to touch, pin,and vibration. Psychiatric Judgement and insight Intact.. Oriented times 3.. . Notes Extensive bilateral medial calf ulcerations. Worse. Covered with dry adherent eschar. Minimal cellulitis. Hyperpigmentation consistent with chronic venous stasis disease. 1+ pitting edema. Bounding dorsalis pedis pulse bilaterally. Palpable PT bilaterally. ABI not obtained secondary to painful ulcerations. Electronic Signature(s) Signed: 04/23/2015 3:24:17 PM By: Loletha Grayer MD Entered By: Loletha Grayer on 04/23/2015 14:34:10 Lebleu, Leward Quan ([id number]) -------------------------------------------------------------------------------- Physician Orders Details Patient Name: [name], Jacqueline. Date of Service: 04/23/2015 1:45 PM Medical Record Number: [id number] Patient Account Number: [id number] Date  of Birth/Sex: 1927/06/01 (81 y.o. Female) Treating RN: Cornell Barman Primary Care Physician: Viviana Simpler Other Clinician: Referring Physician: Viviana Simpler Treating Physician/Extender: BURNS III, Charlean Sanfilippo in Treatment: 1 Verbal / Phone Orders: Yes Clinician: Cornell Barman Read Back and Verified: Yes Diagnosis Coding Wound Cleansing Wound #6 Left,Circumferential Lower Leg o Clean wound with Normal Saline. Wound #7 Right,Medial Lower Leg o Clean wound with Normal Saline. Primary Wound Dressing Wound #6 Left,Circumferential Lower Leg o Other: - mupirocin cream Wound #7 Right,Medial Lower Leg o Other: - mupirocin cream Secondary Dressing Wound #6 Left,Circumferential Lower Leg o ABD and  Kerlix/Conform Wound #7 Right,Medial Lower Leg o ABD and Kerlix/Conform Dressing Change Frequency Wound #6 Left,Circumferential Lower Leg o Change dressing every day. Wound #7 Right,Medial Lower Leg o Change dressing every day. Follow-up Appointments Wound #6 Left,Circumferential Lower Leg o Return Appointment in 1 week. o Other: - See Dr. Con Memos on Monday Wound #7 Right,Medial Lower Leg o Return Appointment in 1 week. o Other: - See Dr. Con Memos on Monday Jacqueline, [name]. ([id number]) Edema Control Wound #6 Left,Circumferential Lower Leg o Tubigrip Wound #7 Right,Medial Lower Leg o Tubigrip Medications-please add to medication list. Wound #6 Left,Circumferential Lower Leg o P.O. Antibiotics - clindimicin Wound #7 Right,Medial Lower Leg o P.O. Antibiotics - clindimicin Services and Therapies o Venous Studies -Bilateral oooo o Arterial Studies- Bilateral oooo Patient Medications Allergies: Penicillins, sulfa drugs, adhesive, Pradaxa, Ativan, Coumadin Notifications Medication Indication Start End clindamycin HCl BLE cellulitis 04/23/2015 DOSE oral 300 mg capsule - 300mg  po TID mupirocin BLE ulcers 04/24/2015 DOSE topical 2 % ointment - ointment  topical to BLE ulcers bid x 2 weeks mupirocin BLE ulcers 04/25/2015 DOSE topical 2 % ointment - ointment topical bid to calf ulcers x 2 weeks Electronic Signature(s) Signed: 04/25/2015 10:24:13 AM By: Loletha Grayer MD Previous Signature: 04/25/2015 8:05:48 AM Version By: Loletha Grayer MD [name] ([id number]) Previous Signature: 04/24/2015 8:39:06 AM Version By: Loletha Grayer MD Previous Signature: 04/23/2015 3:24:17 PM Version By: Loletha Grayer MD Previous Signature: 04/23/2015 2:35:50 PM Version By: Loletha Grayer MD Entered By: Loletha Grayer on 04/25/2015 10:24:13 Prabhu, Leward Quan ([id number]) -------------------------------------------------------------------------------- Problem List Details Patient Name: MCKINSLEY, KOELZER. Date of Service: 04/23/2015 1:45 PM Medical Record Number: [id number] Patient Account Number: [id number] Date of Birth/Sex: 1926-11-08 (81 y.o. Female) Treating RN: Baruch Gouty, RN, BSN, Velva Harman Primary Care Physician: Viviana Simpler Other Clinician: Referring Physician: Viviana Simpler Treating Physician/Extender: BURNS III, Charlean Sanfilippo in Treatment: 1 Active Problems ICD-10 Encounter Code Description Active Date Diagnosis I83.212 Varicose veins of right lower extremity with both ulcer of 04/16/2015 Yes calf and inflammation I83.222 Varicose veins of left lower extremity with both ulcer of 04/16/2015 Yes calf and inflammation L03.115 Cellulitis of right lower limb 04/16/2015 Yes L03.116 Cellulitis of left lower limb 04/16/2015 Yes I48.2 Chronic atrial fibrillation 04/16/2015 Yes I87.2 Venous insufficiency (chronic) (peripheral) 04/16/2015 Yes J44.9 Chronic obstructive pulmonary disease, unspecified 04/16/2015 Yes N18.9 Chronic kidney disease, unspecified 04/16/2015 Yes Inactive Problems Resolved Problems Electronic Signature(s) Signed: 04/23/2015 3:24:17 PM By: Loletha Grayer MD KENADI, MILTNER ([id number]) Entered  By: Loletha Grayer on 04/23/2015 14:28:57 [name] ([id number]) -------------------------------------------------------------------------------- Progress Note Details Patient Name: [name] Date of Service: 04/23/2015 1:45 PM Medical Record Number: [id number] Patient Account Number: [id number] Date of Birth/Sex: [birth year] (81 y.o. Female) Treating RN: Baruch Gouty, RN, BSN, Velva Harman Primary Care Physician: Viviana Simpler Other Clinician: Referring Physician: Viviana Simpler Treating Physician/Extender: BURNS III, Charlean Sanfilippo in Treatment: 1 Subjective Chief Complaint Information obtained from Patient Bilateral calf ulcerations since early October 2016. History of Present Illness (HPI) Pleasant 81 year old with history of chronic venous insufficiency, atrial fibrillation (on Eloquis), COPD, and chronic kidney disease. No h/o DM or PAD. She developed bilateral calf ulcerations in early October 2016. Worse with antibiotic cream. Usually wears compression stockings but has not been wearing them since she developed the ulcerations. No recent ultrasound. She reports significant pain with pressure. No ischemic rest pain or claudication. Pain improved with leg elevation. Culture 04/16/2015 grew MRSA sensitive to tetracycline, clindamycin, and Cipro. She completed  a one- week course of doxycycline and has experienced worsening itching. She has been applying silver alginate and using a juxtalite for edema control. She returns to clinic for follow-up and says that her pain is worse. Improved with leg elevation. Itching is severe. No fever or chills. Minimal drainage. Objective Constitutional Pulse regular. Respirations normal and unlabored. Afebrile. Vitals Time Taken: 2:02 PM, Height: 62 in, Weight: 115 lbs, BMI: 21, Temperature: 98.0 F, Pulse: 107 bpm, Respiratory Rate: 18 breaths/min, Blood Pressure: 119/59 mmHg. Respiratory WNL. No retractions.. Cardiovascular Pedal Pulses  WNL. LINA, HITCH ([id number]) Neurological Sensation normal to touch, pin,and vibration. Psychiatric Judgement and insight Intact.. Oriented times 3.. General Notes: Extensive bilateral medial calf ulcerations. Worse. Covered with dry adherent eschar. Minimal cellulitis. Hyperpigmentation consistent with chronic venous stasis disease. 1+ pitting edema. Bounding dorsalis pedis pulse bilaterally. Palpable PT bilaterally. ABI not obtained secondary to painful ulcerations. Integumentary (Hair, Skin) Wound #6 status is Open. Original cause of wound was Gradually Appeared. The wound is located on the Left,Circumferential Lower Leg. The wound measures 14.5cm length x 2cm width x 0.1cm depth; 22.777cm^2 area and 2.278cm^3 volume. The wound is limited to skin breakdown. There is a medium amount of serosanguineous drainage noted. The wound margin is distinct with the outline attached to the wound base. There is medium (34-66%) red, pink granulation within the wound bed. There is a medium (34- 66%) amount of necrotic tissue within the wound bed including Eschar. The periwound skin appearance exhibited: Localized Edema, Dry/Scaly, Mottled, Pallor. The periwound skin appearance did not exhibit: Callus, Crepitus, Excoriation, Fluctuance, Friable, Induration, Rash, Scarring, Maceration, Moist, Atrophie Blanche, Cyanosis, Ecchymosis, Hemosiderin Staining, Rubor, Erythema. Periwound temperature was noted as No Abnormality. The periwound has tenderness on palpation. Wound #7 status is Open. Original cause of wound was Gradually Appeared. The wound is located on the Right,Medial Lower Leg. The wound measures 15cm length x 16cm width x 0.1cm depth; 188.496cm^2 area and 18.85cm^3 volume. The wound is limited to skin breakdown. There is a medium amount of serosanguineous drainage noted. The wound margin is indistinct and nonvisible. There is medium (34-66%) red, pink granulation within the wound bed. There  is a medium (34-66%) amount of necrotic tissue within the wound bed including Eschar. The periwound skin appearance exhibited: Localized Edema, Moist, Mottled, Pallor. The periwound skin appearance did not exhibit: Callus, Crepitus, Excoriation, Fluctuance, Friable, Induration, Rash, Scarring, Dry/Scaly, Maceration, Atrophie Blanche, Cyanosis, Ecchymosis, Hemosiderin Staining, Rubor, Erythema. Periwound temperature was noted as No Abnormality. The periwound has tenderness on palpation. 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 Consistent with venous stasis ulcerations. MRSA positive culture. Plan Wound Cleansing: Wound #6 Left,Circumferential Lower Leg: Clean wound with Normal Saline. Wound #7 Right,Medial Lower Leg: Clean wound with Normal Saline. Primary Wound Dressing: Wound #6 Left,Circumferential Lower Leg: Other: - mupirocin cream Wound #7 Right,Medial Lower Leg: Other: - mupirocin cream Secondary Dressing: Wound #6 Left,Circumferential Lower Leg: ABD and Kerlix/Conform Wound #7 Right,Medial Lower Leg: ABD and Kerlix/Conform Dressing Change Frequency: Wound #6 Left,Circumferential Lower Leg: Change dressing every day.  Wound #7 Right,Medial Lower Leg: Change dressing every day. Follow-up Appointments: Wound #6 Left,Circumferential Lower Leg: Return Appointment in 1 week. Other: - See Dr. Con Memos on Monday Wound #7 Right,Medial Lower  Leg: Return Appointment in 1 week. Other: - See Dr. Con Memos on Monday Edema Control: Wound #6 Left,Circumferential Lower Leg: Tubigrip Wound #7 Right,Medial Lower Leg: Tubigrip Medications-please add to medication list.: Wound #6 Left,Circumferential Lower Leg: IKEYA, BROCKEL. ([id number]) P.O. Antibiotics - clindimicin Wound #7 Right,Medial Lower Leg: P.O. Antibiotics - clindimicin Services and Therapies ordered were: Venous Studies -Bilateral, Arterial Studies- Bilateral The following medication(s) was prescribed: clindamycin HCl oral 300 mg capsule 300mg  po TID for BLE cellulitis starting 04/23/2015 mupirocin topical 2 % ointment ointment topical to BLE ulcers bid x 2 weeks for BLE ulcers starting 04/24/2015 Switch to mupirocin cream dressing changes. Antibiotics switch to clindamycin. Edema control with either Tubigrip or juxtalite. Return to clinic Friday or Monday for close f/u. Electronic Signature(s) Signed: 04/24/2015 8:39:53 AM By: Loletha Grayer MD Previous Signature: 04/23/2015 3:24:17 PM Version By: Loletha Grayer MD Entered By: Loletha Grayer on 04/24/2015 08:39:33 Gorr, Leward Quan ([id number]) -------------------------------------------------------------------------------- SuperBill Details Patient Name: [name] Date of Service: 04/23/2015 Medical Record Number: [id number] Patient Account Number: [id number] Date of Birth/Sex: 06-24-1927 (81 y.o. 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 I83.222 Varicose veins of left lower extremity with both ulcer of calf and inflammation L03.115 Cellulitis of right lower limb L03.116 Cellulitis of left lower  limb I48.2 Chronic atrial fibrillation I87.2 Venous insufficiency (chronic) (peripheral) J44.9 Chronic obstructive pulmonary disease, unspecified N18.9 Chronic kidney disease, unspecified Facility Procedures CPT4 Code: [id number] Description: 99213 - WOUND CARE VISIT-LEV 3 EST PT Modifier: Quantity: 1 Physician Procedures CPT4: Description Modifier Quantity Code [id number] 99214 - WC PHYS LEVEL 4 - EST PT 1 ICD-10 Description Diagnosis I83.212 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

## 2015-04-23 NOTE — Telephone Encounter (Signed)
Pt called wanting to order a cane Please call pt back

## 2015-04-23 NOTE — Telephone Encounter (Signed)
Pt would like an order for a cane. Pt is also requesting a cb on cell phone  Pt has appt with [facility] 10/21 cb number is [phone] Thank you

## 2015-04-24 ENCOUNTER — Encounter: Payer: Self-pay | Admitting: Internal Medicine

## 2015-04-24 NOTE — Progress Notes (Signed)
SANYAH, MOLNAR ([id number]) Visit Report for 04/23/2015 Arrival Information Details Patient Name: [name], [name]. Date of Service: 04/23/2015 1:45 PM Medical Record Number: [id number] Patient Account Number: [id number] Date of Birth/Sex: [birth year] (81 y.o. Female) Treating RN: Cornell Barman Primary Care Physician: Viviana Simpler Other Clinician: Referring Physician: Viviana Simpler Treating Physician/Extender: BURNS III, Charlean Sanfilippo in Treatment: 1 Visit Information History Since Last Visit Added or deleted any medications: No Patient Arrived: Ambulatory Any new allergies or adverse reactions: No Arrival Time: 13:58 Had a fall or experienced change in No Accompanied By: self activities of daily living that may affect Transfer Assistance: None risk of falls: Patient Identification Verified: Yes Signs or symptoms of abuse/neglect since last No Secondary Verification Process Yes visito Completed: Hospitalized since last visit: No Patient Requires Transmission- No Has Dressing in Place as Prescribed: Yes Based Precautions: Pain Present Now: Yes Patient Has Alerts: Yes Patient Alerts: Patient on Blood Thinner Electronic Signature(s) Signed: 04/23/2015 4:00:48 PM By: Gretta Cool, RN, BSN, Kim RN, BSN Entered By: Gretta Cool, RN, BSN, Kim on 04/23/2015 13:59:18 [name], [name] ([id number]) -------------------------------------------------------------------------------- Encounter Discharge Information Details Patient Name: [name], [name]. Date of Service: 04/23/2015 1:45 PM Medical Record Number: [id number] Patient Account Number: [id number] Date of Birth/Sex: October 23, 1926 (81 y.o. Female) Treating RN: Baruch Gouty, RN, BSN, Velva Harman Primary Care Physician: Viviana Simpler Other Clinician: Referring Physician: Viviana Simpler Treating Physician/Extender: BURNS III, Charlean Sanfilippo in Treatment: 1 Encounter Discharge Information Items Discharge Pain Level: 3 Discharge Condition: Stable Ambulatory  Status: Ambulatory Discharge Destination: Home Transportation: Private Auto Accompanied By: self Schedule Follow-up Appointment: Yes Medication Reconciliation completed and provided to Patient/Care Yes Jamarea Selner: Provided on Clinical Summary of Care: 04/23/2015 Form Type Recipient Paper Patient JD Electronic Signature(s) Signed: 04/23/2015 4:00:48 PM By: Gretta Cool, RN, BSN, Kim RN, BSN Previous Signature: 04/23/2015 2:45:32 PM Version By: Ruthine Dose Entered By: Gretta Cool RN, BSN, Kim on 04/23/2015 14:49:20 [name], [name] ([id number]) -------------------------------------------------------------------------------- Lower Extremity Assessment Details Patient Name: [name], [name]. Date of Service: 04/23/2015 1:45 PM Medical Record Number: [id number] Patient Account Number: [id number] Date of Birth/Sex: 1927/05/17 (81 y.o. Female) Treating RN: Cornell Barman Primary Care Physician: Viviana Simpler Other Clinician: Referring Physician: Viviana Simpler Treating Physician/Extender: BURNS III, Charlean Sanfilippo in Treatment: 1 Edema Assessment Assessed: [Left: No] [Right: No] E[Left: dema] [Right: :] Calf Left: Right: Point of Measurement: 35 cm From Medial Instep 34.5 cm 33 cm Ankle Left: Right: Point of Measurement: 10 cm From Medial Instep 21.5 cm 21.5 cm Vascular Assessment Pulses: Posterior Tibial Palpable: [Left:Yes] [Right:Yes] Dorsalis Pedis Palpable: [Left:Yes] [Right:Yes] Extremity colors, hair growth, and conditions: Extremity Color: [Left:Hyperpigmented] [Right:Hyperpigmented] Hair Growth on Extremity: [Left:No] [Right:No] Temperature of Extremity: [Left:Warm] [Right:Warm] Capillary Refill: [Left:< 3 seconds] [Right:< 3 seconds] Toe Nail Assessment Left: Right: Thick: No No Discolored: No No Deformed: No No Improper Length and Hygiene: No No Electronic Signature(s) Signed: 04/23/2015 4:00:48 PM By: Gretta Cool, RN, BSN, Kim RN, BSN Entered By: Gretta Cool, RN, BSN, Kim on  04/23/2015 14:19:02 [name], [name] ([name], [name] ([id number]) -------------------------------------------------------------------------------- Multi Wound Chart Details Patient Name: [name]. Date of Service: 04/23/2015 1:45 PM Medical Record Number: [id number] Patient Account Number: [id number] Date of Birth/Sex: [birth year] (81 y.o. Female) Treating RN: Cornell Barman Primary Care Physician: Viviana Simpler Other Clinician: Referring Physician: Viviana Simpler Treating Physician/Extender: BURNS III, Charlean Sanfilippo in Treatment: 1 Vital Signs Height(in): 62 Pulse(bpm): 107 Weight(lbs): 115 Blood Pressure 119/59 (mmHg): Body Mass Index(BMI): 21 Temperature(F): 98.0 Respiratory Rate 18 (breaths/min): Photos: [6:No Photos] [7:No Photos] [N/A:N/A]  Wound Location: [6:Left Lower Leg - Circumfernential] [7:Right Lower Leg - Medial N/A] Wounding Event: [6:Gradually Appeared] [7:Gradually Appeared] [N/A:N/A] Primary Etiology: [6:Venous Leg Ulcer] [7:Venous Leg Ulcer] [N/A:N/A] Comorbid History: [6:Cataracts, Anemia, Arrhythmia, Rheumatoid Arthritis, Osteoarthritis, Received Chemotherapy] [7:Cataracts, Anemia, Arrhythmia, Rheumatoid Arthritis, Osteoarthritis, Received Chemotherapy] [N/A:N/A] Date Acquired: [6:04/07/2015] [7:04/07/2015] [N/A:N/A] Weeks of Treatment: [6:1] [7:1] [N/A:N/A] Wound Status: [6:Open] [7:Open] [N/A:N/A] Measurements L x W x D 14.5x2x0.1 [7:15x16x0.1] [N/A:N/A] (cm) Area (cm) : [6:22.777] [7:188.496] [N/A:N/A] Volume (cm) : [6:2.278] [7:18.85] [N/A:N/A] % Reduction in Area: [6:19.40%] [7:-114.20%] [N/A:N/A] % Reduction in Volume: 19.40% [7:-114.20%] [N/A:N/A] Classification: [6:Partial Thickness] [7:Partial Thickness] [N/A:N/A] Exudate Amount: [6:Medium] [7:Medium] [N/A:N/A] Exudate Type: [6:Serosanguineous] [7:Serosanguineous] [N/A:N/A] Exudate Color: [6:red, brown] [7:red, brown] [N/A:N/A] Wound Margin: [6:Distinct, outline attached]  [7:Indistinct, nonvisible] [N/A:N/A] Granulation Amount: [6:Medium (34-66%)] [7:Medium (34-66%)] [N/A:N/A] Granulation Quality: [6:Red, Pink] [7:Red, Pink] [N/A:N/A] Necrotic Amount: [6:Medium (34-66%)] [7:Medium (34-66%)] [N/A:N/A] Necrotic Tissue: [6:Eschar] [7:Eschar] [N/A:N/A] Exposed Structures: [6:Fascia: No Fat: No Tendon: No] [7:Fascia: No Fat: No Tendon: No] [N/A:N/A] Muscle: No Muscle: No Joint: No Joint: No Bone: No Bone: No Limited to Skin Limited to Skin Breakdown Breakdown Epithelialization: None None N/A Periwound Skin Texture: Edema: Yes Edema: Yes N/A Excoriation: No Excoriation: No Induration: No Induration: No Callus: No Callus: No Crepitus: No Crepitus: No Fluctuance: No Fluctuance: No Friable: No Friable: No Rash: No Rash: No Scarring: No Scarring: No Periwound Skin Dry/Scaly: Yes Moist: Yes N/A Moisture: Maceration: No Maceration: No Moist: No Dry/Scaly: No Periwound Skin Color: Mottled: Yes Mottled: Yes N/A Pallor: Yes Pallor: Yes Atrophie Blanche: No Atrophie Blanche: No Cyanosis: No Cyanosis: No Ecchymosis: No Ecchymosis: No Erythema: No Erythema: No Hemosiderin Staining: No Hemosiderin Staining: No Rubor: No Rubor: No Temperature: No Abnormality No Abnormality N/A Tenderness on Yes Yes N/A Palpation: Wound Preparation: Topical Anesthetic Ulcer Cleansing: N/A Applied: Other: lidocaine Rinsed/Irrigated with 4% Saline Topical Anesthetic Applied: None Treatment Notes Electronic Signature(s) Signed: 04/23/2015 4:00:48 PM By: Gretta Cool, RN, BSN, Kim RN, BSN Entered By: Gretta Cool, RN, BSN, Kim on 04/23/2015 14:21:56 [place], [name] ([id number]) -------------------------------------------------------------------------------- [place] Details Patient Name: [name], [name]. Date of Service: 04/23/2015 1:45 PM Medical Record Number: [id number] Patient Account Number: [id number] Date of Birth/Sex: 01/03/1927 (81 y.o.  Female) Treating RN: Cornell Barman Primary Care Physician: Viviana Simpler Other Clinician: Referring Physician: Viviana Simpler Treating Physician/Extender: BURNS III, Charlean Sanfilippo in Treatment: 1 Active Inactive Orientation to the Wound Care Program Nursing Diagnoses: Knowledge deficit related to the wound healing center program Goals: Patient/caregiver will verbalize understanding of the [facility] Date Initiated: 04/16/2015 Goal Status: Active Interventions: Provide education on orientation to the wound center Notes: Venous Leg Ulcer Nursing Diagnoses: Potential for venous Insuffiency (use before diagnosis confirmed) Goals: Patient will maintain optimal edema control Date Initiated: 04/16/2015 Goal Status: Active Patient/caregiver will verbalize understanding of disease process and disease management Date Initiated: 04/16/2015 Goal Status: Active Verify adequate tissue perfusion prior to therapeutic compression application Date Initiated: 04/16/2015 Goal Status: Active Interventions: Assess peripheral edema status every visit. Compression as ordered Provide education on venous insufficiency Notes: SAPHIRE, BARNHART ([id number]) Wound/Skin Impairment Nursing Diagnoses: Impaired tissue integrity Knowledge deficit related to ulceration/compromised skin integrity Goals: Patient/caregiver will verbalize understanding of skin care regimen Date Initiated: 04/16/2015 Goal Status: Active Ulcer/skin breakdown will have a volume reduction of 30% by week 4 Date Initiated: 04/16/2015 Goal Status: Active Ulcer/skin breakdown will have a volume reduction of 50% by week 8 Date Initiated: 04/16/2015 Goal Status: Active Ulcer/skin breakdown will have a  volume reduction of 80% by week 12 Date Initiated: 04/16/2015 Goal Status: Active Ulcer/skin breakdown will heal within 14 weeks Date Initiated: 04/16/2015 Goal Status: Active Interventions: Assess  patient/caregiver ability to obtain necessary supplies Assess patient/caregiver ability to perform ulcer/skin care regimen upon admission and as needed Assess ulceration(s) every visit Provide education on ulcer and skin care Treatment Activities: Skin care regimen initiated : 04/23/2015 Topical wound management initiated : 04/23/2015 Notes: Electronic Signature(s) Signed: 04/23/2015 4:00:48 PM By: Gretta Cool, RN, BSN, Kim RN, BSN Entered By: Gretta Cool, RN, BSN, Kim on 04/23/2015 14:21:48 [name] ([id number]) -------------------------------------------------------------------------------- Pain Assessment Details Patient Name: [name]. Date of Service: 04/23/2015 1:45 PM Medical Record Number: [id number] Patient Account Number: [id number] Date of Birth/Sex: 12-10-1926 (81 y.o. Female) Treating RN: Cornell Barman Primary Care Physician: Viviana Simpler Other Clinician: Referring Physician: Viviana Simpler Treating Physician/Extender: BURNS III, Charlean Sanfilippo in Treatment: 1 Active Problems Location of Pain Severity and Description of Pain Patient Has Paino No Site Locations With Dressing Change: Yes Duration of the Pain. Constant / Intermittento Constant Rate the pain. Current Pain Level: 10 Worst Pain Level: 10 Character of Pain Describe the Pain: Burning, Heavy, Shooting Pain Management and Medication Current Pain Management: Medication: No Cold Application: No Rest: No Massage: No Activity: No T.E.N.S.: No Heat Application: No Leg drop or elevation: No Is the Current Pain Management Inadequate Adequate: How does your pain impact your activities of daily livingo Sleep: No Bathing: No Appetite: No Relationship With Others: No Bladder Continence: No Emotions: No Bowel Continence: No Work: No Toileting: No Drive: No Dressing: No Hobbies: No Engineer, maintenance) Signed: 04/23/2015 4:00:48 PM By: Gretta Cool, RN, BSN, Kim RN, BSN [name], [name]  ([id number]) Entered By: Gretta Cool, RN, BSN, Kim on 04/23/2015 13:59:56 [name], [name] ([id number]) -------------------------------------------------------------------------------- Patient/Caregiver Education Details Patient Name: [name] Date of Service: 04/23/2015 1:45 PM Medical Record Number: [id number] Patient Account Number: [id number] Date of Birth/Gender: 13-Jul-1926 (81 y.o. Female) Treating RN: Cornell Barman Primary Care Physician: Viviana Simpler Other Clinician: Referring Physician: Viviana Simpler Treating Physician/Extender: BURNS III, Charlean Sanfilippo in Treatment: 1 Education Assessment Education Provided To: Patient Education Topics Provided Wound/Skin Impairment: Handouts: Caring for Your Ulcer, Other: wound care as prescribed Methods: Demonstration, Explain/Verbal Responses: State content correctly Electronic Signature(s) Signed: 04/23/2015 4:00:48 PM By: Gretta Cool, RN, BSN, Kim RN, BSN Entered By: Gretta Cool, RN, BSN, Kim on 04/23/2015 14:49:39 Meaney, [name] ([id number]) -------------------------------------------------------------------------------- Wound Assessment Details Patient Name: [name], [name]. Date of Service: 04/23/2015 1:45 PM Medical Record Number: [id number] Patient Account Number: [id number] Date of Birth/Sex: [birth year] (81 y.o. Female) Treating RN: Cornell Barman Primary Care Physician: Viviana Simpler Other Clinician: Referring Physician: Viviana Simpler Treating Physician/Extender: BURNS III, Charlean Sanfilippo in Treatment: 1 Wound Status Wound Number: 6 Primary Venous Leg Ulcer Etiology: Wound Location: Left Lower Leg - Circumfernential Wound Open Status: Wounding Event: Gradually Appeared Comorbid Cataracts, Anemia, Arrhythmia, Date Acquired: 04/07/2015 History: Rheumatoid Arthritis, Osteoarthritis, Weeks Of Treatment: 1 Received Chemotherapy Clustered Wound: No Photos Wound Measurements Length: (cm) 14.5 Width: (cm) 2 Depth: (cm)  0.1 Area: (cm) 22.777 Volume: (cm) 2.278 % Reduction in Area: 19.4% % Reduction in Volume: 19.4% Epithelialization: None Wound Description Classification: Partial Thickness Wound Margin: Distinct, outline attached Exudate Amount: Medium Exudate Type: Serosanguineous Exudate Color: red, brown [name], [name] ([id number]) Foul Odor After Cleansing: No Wound Bed Granulation Amount: Medium (34-66%) Exposed Structure Granulation Quality: Red, Pink Fascia Exposed: No Necrotic Amount: Medium (34-66%) Fat Layer Exposed: No Necrotic Quality: Eschar Tendon Exposed:  No Muscle Exposed: No Joint Exposed: No Bone Exposed: No Limited to Skin Breakdown Periwound Skin Texture Texture Color No Abnormalities Noted: No No Abnormalities Noted: No Callus: No Atrophie Blanche: No Crepitus: No Cyanosis: No Excoriation: No Ecchymosis: No Fluctuance: No Erythema: No Friable: No Hemosiderin Staining: No Induration: No Mottled: Yes Localized Edema: Yes Pallor: Yes Rash: No Rubor: No Scarring: No Temperature / Pain Moisture Temperature: No Abnormality No Abnormalities Noted: No Tenderness on Palpation: Yes Dry / Scaly: Yes Maceration: No Moist: No Wound Preparation Topical Anesthetic Applied: Other: lidocaine 4%, Treatment Notes Wound #6 (Left, Circumferential Lower Leg) 1. Cleansed with: Clean wound with Normal Saline 2. Anesthetic Topical Lidocaine 4% cream to wound bed prior to debridement 5. Secondary Dressing Applied ABD Pad Notes Mupiricin cream, abd pads and stretch net Electronic Signature(s) Signed: 04/23/2015 4:47:25 PM By: Gretta Cool, RN, BSN, Kim RN, BSN Previous Signature: 04/23/2015 4:00:48 PM Version By: Gretta Cool RN, BSN, Kim RN, BSN [name], [name] ([id number]) Entered By: Gretta Cool RN, BSN, Kim on 04/23/2015 16:38:23 [name] ([id number]) -------------------------------------------------------------------------------- Wound Assessment Details Patient  Name: [name], [name]. Date of Service: 04/23/2015 1:45 PM Medical Record Number: [id number] Patient Account Number: [id number] Date of Birth/Sex: [birth year] (81 y.o. Female) Treating RN: Cornell Barman Primary Care Physician: Viviana Simpler Other Clinician: Referring Physician: Viviana Simpler Treating Physician/Extender: BURNS III, Charlean Sanfilippo in Treatment: 1 Wound Status Wound Number: 7 Primary Venous Leg Ulcer Etiology: Wound Location: Right Lower Leg - Medial Wound Open Wounding Event: Gradually Appeared Status: Date Acquired: 04/07/2015 Comorbid Cataracts, Anemia, Arrhythmia, Weeks Of Treatment: 1 History: Rheumatoid Arthritis, Osteoarthritis, Clustered Wound: No Received Chemotherapy Photos Wound Measurements Length: (cm) 15 Width: (cm) 16 Depth: (cm) 0.1 Area: (cm) 188.496 Volume: (cm) 18.85 % Reduction in Area: -114.2% % Reduction in Volume: -114.2% Epithelialization: None Wound Description Classification: Partial Thickness Foul Odor Aft Wound Margin: Indistinct, nonvisible Exudate Amount: Medium Exudate Type: Serosanguineous Exudate Color: red, brown [name], [name] ([id number]) er Cleansing: No Wound Bed Granulation Amount: Medium (34-66%) Exposed Structure Granulation Quality: Red, Pink Fascia Exposed: No Necrotic Amount: Medium (34-66%) Fat Layer Exposed: No Necrotic Quality: Eschar Tendon Exposed: No Muscle Exposed: No Joint Exposed: No Bone Exposed: No Limited to Skin Breakdown Periwound Skin Texture Texture Color No Abnormalities Noted: No No Abnormalities Noted: No Callus: No Atrophie Blanche: No Crepitus: No Cyanosis: No Excoriation: No Ecchymosis: No Fluctuance: No Erythema: No Friable: No Hemosiderin Staining: No Induration: No Mottled: Yes Localized Edema: Yes Pallor: Yes Rash: No Rubor: No Scarring: No Temperature / Pain Moisture Temperature: No Abnormality No Abnormalities Noted: No Tenderness on Palpation: Yes Dry /  Scaly: No Maceration: No Moist: Yes Wound Preparation Ulcer Cleansing: Rinsed/Irrigated with Saline Topical Anesthetic Applied: None Treatment Notes Wound #7 (Right, Medial Lower Leg) 1. Cleansed with: Clean wound with Normal Saline 2. Anesthetic Topical Lidocaine 4% cream to wound bed prior to debridement 5. Secondary Dressing Applied ABD Pad Notes Mupiricin cream, abd pads and stretch net Electronic Signature(s) Signed: 04/23/2015 4:47:25 PM By: Gretta Cool, RN, BSN, Kim RN, BSN Previous Signature: 04/23/2015 4:00:48 PM Version By: Gretta Cool RN, BSN, Kim RN, BSN [name], [name] ([id number]) Entered By: Gretta Cool RN, BSN, Kim on 04/23/2015 16:37:37 [name], [name] ([id number]) -------------------------------------------------------------------------------- [place] Details Patient Name: ZENIYA, LAPIDUS. Date of Service: 04/23/2015 1:45 PM Medical Record Number: [id number] Patient Account Number: [id number] Date of Birth/Sex: 28-Nov-1926 (81 y.o. Female) Treating RN: Cornell Barman Primary Care Physician: Viviana Simpler Other Clinician: Referring Physician: Viviana Simpler Treating Physician/Extender: BURNS III, Charlean Sanfilippo  in Treatment: 1 Vital Signs Time Taken: 14:02 Temperature (F): 98.0 Height (in): 62 Pulse (bpm): 107 Weight (lbs): 115 Respiratory Rate (breaths/min): 18 Body Mass Index (BMI): 21 Blood Pressure (mmHg): 119/59 Reference Range: 80 - 120 mg / dl Electronic Signature(s) Signed: 04/23/2015 4:00:48 PM By: Gretta Cool, RN, BSN, Kim RN, BSN Entered By: Gretta Cool, RN, BSN, Kim on 04/23/2015 14:03:16

## 2015-04-24 NOTE — Telephone Encounter (Signed)
Pt would like a call back [phone]

## 2015-04-24 NOTE — Telephone Encounter (Signed)
Rx written Not sure Medicare will cover this--especially without specific documentation in a progress note

## 2015-04-24 NOTE — Telephone Encounter (Signed)
Spoke with patient and advised Dr. Silvio Pate will be in this afternoon.

## 2015-04-24 NOTE — Telephone Encounter (Signed)
Spoke with patient and she did get her cane

## 2015-04-28 ENCOUNTER — Encounter: Payer: Medicare Other | Admitting: Surgery

## 2015-04-28 ENCOUNTER — Encounter: Payer: Self-pay | Admitting: Internal Medicine

## 2015-04-28 DIAGNOSIS — L97221 Non-pressure chronic ulcer of left calf limited to breakdown of skin: Secondary | ICD-10-CM | POA: Diagnosis not present

## 2015-04-28 MED ORDER — ACYCLOVIR 5 % EX OINT: 1.0000 "application " | TOPICAL_OINTMENT | CUTANEOUS | Status: DC | PRN

## 2015-04-28 NOTE — Telephone Encounter (Signed)
rx sent to pharmacy by e-script Spoke with patient and advised results   

## 2015-04-28 NOTE — Telephone Encounter (Signed)
Please confirm from her she means pills Where is the herpes? If she wants pills, 400mg  tid  #19 x1

## 2015-04-28 NOTE — Telephone Encounter (Signed)
Spoke with patient and she has a rash around her mouth, it was inside and now on the outside, please advise  Is this acyclovir

## 2015-04-28 NOTE — Telephone Encounter (Signed)
Okay to send Rx 1 container of topical-- apply q 2 hours till resolved #2 refills

## 2015-04-28 NOTE — Telephone Encounter (Signed)
Spoke with pharmacist and pt is asking for the Zovirax ointment, pt was on mupirocin 2% ointment and per pharmacist at [facility] pt said the wound center is stopping this. Also pt is on Doxycycline 100mg  and Clindamycin 300mg . Pt is very edgy and states she's in pain, please advise

## 2015-04-29 ENCOUNTER — Telehealth: Payer: Self-pay | Admitting: Cardiovascular Disease

## 2015-04-29 NOTE — Telephone Encounter (Signed)
Notified Dr. Rockey Situ of pt's condition.

## 2015-04-29 NOTE — Telephone Encounter (Signed)
Patient not interested in scheduling OD fu at this time as she is unable to walk and is in bed with a "crisis" Patient wants to make sure that Dr. Rockey Situ is aware of her current situation.   Tried to have patient schedule appt for January in hopes that she will feel better and able to come. Patient not interested even though she was made aware that Dr. Donivan Scull schedule may be full when she calls back in January to schedule.  Patient states" he is my cardiologist and he should be able to work me in"  This was the 3rd attempt to schedule fu from recall.     Deleting recall from list.

## 2015-04-29 NOTE — Progress Notes (Signed)
[name], [name] ([id number]) Visit Report for 04/28/2015 Chief Complaint Document Details Patient Name: [name], [name] 04/28/2015 11:00 Date of Service: AM Medical Record [id number] Number: Patient Account Number: [id number] 04/18/1927 (81 y.o. Treating RN: Montey Hora Date of Birth/Sex: Female) Other Clinician: Primary Care Physician: Jose Persia Referring Physician: Viviana Simpler Physician/Extender: Suella Grove in Treatment: 1 Information Obtained from: Patient Chief Complaint Bilateral calf ulcerations since early October 2016. Electronic Signature(s) Signed: 04/28/2015 11:45:18 AM By: Christin Fudge MD, FACS Entered By: Christin Fudge on 04/28/2015 11:45:18 [name], [name] ([id number]) -------------------------------------------------------------------------------- HPI Details Patient Name: [name], [name] 04/28/2015 11:00 Date of Service: AM Medical Record [id number] Number: Patient Account Number: [id number] 08-21-1926 (81 y.o. Treating RN: Montey Hora Date of Birth/Sex: Female) Other Clinician: Primary Care Physician: Jose Persia Referring Physician: Viviana Simpler Physician/Extender: Weeks in Treatment: 1 History of Present Illness HPI Description: Pleasant 81 year old with history of chronic venous insufficiency, atrial fibrillation (on Eloquis), COPD, and chronic kidney disease. No h/o DM or PAD. She developed bilateral calf ulcerations in early October 2016. Worse with antibiotic cream. Usually wears compression stockings but has not been wearing them since she developed the ulcerations. No recent ultrasound. She reports significant pain with pressure. No ischemic rest pain or claudication. Pain improved with leg elevation. Culture 04/16/2015 grew MRSA sensitive to tetracycline, clindamycin, and Cipro. She completed a one- week course of doxycycline and has experienced worsening itching. She has been  applying silver alginate and using a juxtalite for edema control. She returns to clinic for follow-up and says that her pain is worse. Improved with leg elevation. Itching is severe. No fever or chills. Minimal drainage. 04/28/2015 -- she is here for review and since she saw Dr. Quay Burow last Wednesday she has had a vascular workup done on Friday and as per the patient's verbal report everything was fine and there was no venous insufficiency found. Official report is awaited. She is also having a lot of problems applying the Bactroban ointment and is unable to do this dressing appropriately. Electronic Signature(s) Signed: 04/28/2015 11:46:15 AM By: Christin Fudge MD, FACS Entered By: Christin Fudge on 04/28/2015 11:46:15 [name] ([id number]) -------------------------------------------------------------------------------- Physical Exam Details Patient Name: [name], [name] 04/28/2015 11:00 Date of Service: AM Medical Record [id number] Number: Patient Account Number: [id number] [birth year] (81 y.o. Treating RN: Montey Hora Date of Birth/Sex: Female) Other Clinician: Primary Care Physician: Jose Persia Referring Physician: Viviana Simpler Physician/Extender: Weeks in Treatment: 1 Constitutional . Pulse regular. Respirations normal and unlabored. Afebrile. . Eyes Nonicteric. Reactive to light. Ears, Nose, Mouth, and Throat Lips, teeth, and gums WNL.Marland Kitchen Moist mucosa without lesions . Neck supple and nontender. No palpable supraclavicular or cervical adenopathy. Normal sized without goiter. Respiratory WNL. No retractions.. Cardiovascular Pedal Pulses WNL. No clubbing, cyanosis or edema. Chest Breasts symmetical and no nipple discharge.. Breast tissue WNL, no masses, lumps, or tenderness.. Lymphatic No adneopathy. No adenopathy. No adenopathy. Musculoskeletal Adexa without tenderness or enlargement.. Digits and nails w/o clubbing, cyanosis,  infection, petechiae, ischemia, or inflammatory conditions.. Integumentary (Hair, Skin) No suspicious lesions. No crepitus or fluctuance. No peri-wound warmth or erythema. No masses.Marland Kitchen Psychiatric Judgement and insight Intact.. No evidence of depression, anxiety, or agitation.. Notes The patient has eschar covering all her ulcerations with no evidence of cellulitis. The old hyperpigmentation of her lower extremities is as before. good pulses palpable bilaterally DP. Electronic Signature(s) Signed: 04/28/2015 11:47:34 AM By: Christin Fudge MD, FACS Entered By: Christin Fudge on 04/28/2015  11:47:34 [name], [name] ([id number]) -------------------------------------------------------------------------------- Physician Orders Details Patient Name: [name], [name] 04/28/2015 11:00 Date of Service: AM Medical Record [id number] Number: Patient Account Number: [id number] [birth year] (81 y.o. Treating RN: Montey Hora Date of Birth/Sex: Female) Other Clinician: Primary Care Physician: Jose Persia Referring Physician: Viviana Simpler Physician/Extender: Suella Grove in Treatment: 1 Verbal / Phone Orders: Yes Clinician: Montey Hora Read Back and Verified: Yes Diagnosis Coding Wound Cleansing Wound #6 Left,Circumferential Lower Leg o Clean wound with Normal Saline. Wound #7 Right,Medial Lower Leg o Clean wound with Normal Saline. Primary Wound Dressing Wound #6 Left,Circumferential Lower Leg o Hydrogel Wound #7 Right,Medial Lower Leg o Hydrogel Secondary Dressing Wound #6 Left,Circumferential Lower Leg o Boardered Foam Dressing Wound #7 Right,Medial Lower Leg o Boardered Foam Dressing Dressing Change Frequency Wound #6 Left,Circumferential Lower Leg o Change dressing every day. Wound #7 Right,Medial Lower Leg o Change dressing every day. Follow-up Appointments Wound #6 Left,Circumferential Lower Leg o Return Appointment in 1 week. o  Other: - see Dr Quay Burow on Wednesday Wound #7 Right,Medial Lower Leg [name], [name]. ([id number]) o Return Appointment in 1 week. o Other: - see Dr Quay Burow on Wednesday Edema Control Wound #6 Left,Circumferential Lower Leg o Other: - juxtalite Wound #7 Right,Medial Lower Leg o Other: - [place] #6 Left,Circumferential Lower Leg o [place] for [place] Nurse may visit PRN to address patientos wound care needs. o FACE TO FACE ENCOUNTER: MEDICARE and MEDICAID PATIENTS: I certify that this patient is under my care and that I had a face-to-face encounter that meets the physician face-to-face encounter requirements with this patient on this date. The encounter with the patient was in whole or in part for the following MEDICAL CONDITION: (primary reason for [place]) MEDICAL NECESSITY: I certify, that based on my findings, NURSING services are a medically necessary home health service. HOME BOUND STATUS: I certify that my clinical findings support that this patient is homebound (i.e., Due to illness or injury, pt requires aid of supportive devices such as crutches, cane, wheelchairs, walkers, the use of special transportation or the assistance of another person to leave their place of residence. There is a normal inability to leave the home and doing so requires considerable and taxing effort. Other absences are for medical reasons / religious services and are infrequent or of short duration when for other reasons). o If current dressing causes regression in wound condition, may D/C ordered dressing product/s and apply Normal Saline Moist Dressing daily until next [place] / Other MD appointment. [place] of regression in wound condition at [phone]. o Please direct any NON-WOUND related issues/requests for orders to patient's Primary Care Physician Wound #7 [place] for [place] Nurse may visit PRN to address patientos wound care needs. o FACE TO FACE ENCOUNTER: MEDICARE and MEDICAID PATIENTS: I certify that this patient is under my care and that I had a face-to-face encounter that meets the physician face-to-face encounter requirements with this patient on this date. The encounter with the patient was in whole or in part for the following MEDICAL CONDITION: (primary reason for [place]) MEDICAL NECESSITY: I certify, that based on my findings, NURSING services are a medically necessary home health service. HOME BOUND STATUS: I certify that my clinical findings support that this patient is homebound (i.e., Due to illness or injury, pt requires aid of supportive devices such as  crutches, cane, wheelchairs, walkers, the use of special transportation or the assistance of another person to leave their place of residence. There is a normal inability to leave the home and doing so requires considerable and taxing effort. Other absences are for medical reasons / religious services and are infrequent or of short duration when for other reasons). EPHRATA, [place] ([id number]) o If current dressing causes regression in wound condition, may D/C ordered dressing product/s and apply Normal Saline Moist Dressing daily until next [place] / Other MD appointment. [place] of regression in wound condition at [phone]. o Please direct any NON-WOUND related issues/requests for orders to patient's Primary Care Physician Medications-please add to medication list. Wound #6 Left,Circumferential Lower Leg o P.O. Antibiotics - clindimicin Wound #7 Right,Medial Lower Leg o P.O. Antibiotics - clindimicin Electronic Signature(s) Signed: 04/28/2015 2:14:21 PM By: Christin Fudge MD, FACS Signed: 04/28/2015 5:24:55 PM By: Montey Hora Entered By: Montey Hora on 04/28/2015  11:47:10 [place], Leward Quan ([id number]) -------------------------------------------------------------------------------- Problem List Details Patient Name: [name], [name] 04/28/2015 11:00 Date of Service: AM Medical Record [id number] Number: Patient Account Number: [id number] [birth year] (81 y.o. 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 04/16/2015 Yes calf and inflammation I83.222 Varicose veins of left lower extremity with both ulcer of 04/16/2015 Yes calf and inflammation L03.115 Cellulitis of right lower limb 04/16/2015 Yes L03.116 Cellulitis of left lower limb 04/16/2015 Yes I48.2 Chronic atrial fibrillation 04/16/2015 Yes I87.2 Venous insufficiency (chronic) (peripheral) 04/16/2015 Yes J44.9 Chronic obstructive pulmonary disease, unspecified 04/16/2015 Yes N18.9 Chronic kidney disease, unspecified 04/16/2015 Yes Inactive Problems Resolved Problems Electronic Signature(s) [name], [name] ([id number]) Signed: 04/28/2015 11:45:10 AM By: Christin Fudge MD, FACS Entered By: Christin Fudge on 04/28/2015 11:45:10 [name] ([id number]) -------------------------------------------------------------------------------- Progress Note Details Patient Name: [name], [name] 04/28/2015 11:00 Date of Service: AM Medical Record [id number] Number: Patient Account Number: [id number] Dec 17, 1926 (81 y.o. Treating RN: Montey Hora Date of Birth/Sex: Female) Other Clinician: Primary Care Physician: Jose Persia Referring Physician: Viviana Simpler Physician/Extender: Suella Grove in Treatment: 1 Subjective Chief Complaint Information obtained from  Patient Bilateral calf ulcerations since early October 2016. History of Present Illness (HPI) Pleasant 81 year old with history of chronic venous insufficiency, atrial fibrillation (on Eloquis), COPD, and chronic kidney disease. No h/o DM or PAD. She developed bilateral calf ulcerations in early October 2016. Worse with antibiotic cream. Usually wears compression stockings but has not been wearing them since she developed the ulcerations. No recent ultrasound. She reports significant pain with pressure. No ischemic rest pain or claudication. Pain improved with leg elevation. Culture 04/16/2015 grew MRSA sensitive to tetracycline, clindamycin, and Cipro. She completed a one- week course of doxycycline and has experienced worsening itching. She has been applying silver alginate and using a juxtalite for edema control. She returns to clinic for follow-up and says that her pain is worse. Improved with leg elevation. Itching is severe. No fever or chills. Minimal drainage. 04/28/2015 -- she is here for review and since she saw Dr. Quay Burow last Wednesday she has had a vascular workup done on Friday and as per the patient's verbal report everything was fine and there was no venous insufficiency found. Official report is awaited. She is also having a lot of problems applying the  Bactroban ointment and is unable to do this dressing appropriately. Objective Constitutional Pulse regular. Respirations normal and unlabored. Afebrile. Vitals Time Taken: 11:09 AM, Height: 62 in, Weight: 115 lbs, BMI: 21, Temperature: 98.3 F, Pulse: 89 Marschner, Monia S. ([id number]) bpm, Respiratory Rate: 18 breaths/min, Blood Pressure: 136/57 mmHg. Eyes Nonicteric. Reactive to light. Ears, Nose, Mouth, and Throat Lips, teeth, and gums WNL.Marland Kitchen Moist mucosa without lesions . Neck supple and nontender. No palpable supraclavicular or cervical adenopathy. Normal sized without goiter. Respiratory WNL. No  retractions.. Cardiovascular Pedal Pulses WNL. No clubbing, cyanosis or edema. Chest Breasts symmetical and no nipple discharge.. Breast tissue WNL, no masses, lumps, or tenderness.. Lymphatic No adneopathy. No adenopathy. No adenopathy. Musculoskeletal Adexa without tenderness or enlargement.. Digits and nails w/o clubbing, cyanosis, infection, petechiae, ischemia, or inflammatory conditions.Marland Kitchen Psychiatric Judgement and insight Intact.. No evidence of depression, anxiety, or agitation.. General Notes: The patient has eschar covering all her ulcerations with no evidence of cellulitis. The old hyperpigmentation of her lower extremities is as before. good pulses palpable bilaterally DP. Integumentary (Hair, Skin) No suspicious lesions. No crepitus or fluctuance. No peri-wound warmth or erythema. No masses.. Wound #6 status is Open. Original cause of wound was Gradually Appeared. The wound is located on the Left,Circumferential Lower Leg. The wound measures 16.5cm length x 25cm width x 0.1cm depth; 323.977cm^2 area and 32.398cm^3 volume. The wound is limited to skin breakdown. There is no tunneling or undermining noted. There is a medium amount of serosanguineous drainage noted. The wound margin is distinct with the outline attached to the wound base. There is no granulation within the wound bed. There is a large (67-100%) amount of necrotic tissue within the wound bed including Eschar. The periwound skin appearance exhibited: Localized Edema, Dry/Scaly, Mottled, Pallor. The periwound skin appearance did not exhibit: Callus, Crepitus, Excoriation, Fluctuance, Friable, Induration, Rash, Scarring, Maceration, Moist, Atrophie Blanche, Cyanosis, Ecchymosis, Hemosiderin Staining, Rubor, Erythema. Periwound temperature was noted as No Abnormality. The periwound has tenderness on palpation. Wound #7 status is Open. Original cause of wound was Gradually Appeared. The wound is located on  the Right,Medial Lower Leg. The wound measures 15cm length x 16cm width x 0.1cm depth; 188.496cm^2 Sando, Jacqueline S. ([id number]) area and 18.85cm^3 volume. The wound is limited to skin breakdown. There is no tunneling or undermining noted. There is a medium amount of serosanguineous drainage noted. The wound margin is indistinct and nonvisible. There is no granulation within the wound bed. There is a large (67-100%) amount of necrotic tissue within the wound bed including Eschar. The periwound skin appearance exhibited: Localized Edema, Moist, Mottled, Pallor. The periwound skin appearance did not exhibit: Callus, Crepitus, Excoriation, Fluctuance, Friable, Induration, Rash, Scarring, Dry/Scaly, Maceration, Atrophie Blanche, Cyanosis, Ecchymosis, Hemosiderin Staining, Rubor, Erythema. Periwound temperature was noted as No Abnormality. The periwound has tenderness on palpation. 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 Wound #7 Right,Medial Lower Leg: Clean wound with Normal Saline. Primary Wound Dressing: Wound #6 Left,Circumferential Lower Leg: Hydrogel Wound #7 Right,Medial Lower Leg: Hydrogel Secondary Dressing: Wound #6 Left,Circumferential Lower  Leg: Boardered Foam Dressing Wound #7 Right,Medial Lower Leg: Boardered Foam Dressing Dressing Change Frequency: Wound #6 Left,Circumferential Lower Leg: [name], [name]. ([id number]) Change dressing  every day. Wound #7 Right,Medial Lower Leg: Change dressing every day. Follow-up Appointments: Wound #6 Left,Circumferential Lower Leg: Return Appointment in 1 week. Other: - see Dr Quay Burow on Wednesday Wound #7 Right,Medial Lower Leg: Return Appointment in 1 week. Other: - see Dr Quay Burow on Wednesday Edema Control: Wound #6 Left,Circumferential Lower Leg: Other: - juxtalite Wound #7 Right,Medial Lower Leg: Other: - juxtalite Home Health: Wound #6 Left,Circumferential Lower Leg: [place] for [place] Nurse may visit PRN to address patient s wound care needs. FACE TO FACE ENCOUNTER: MEDICARE and MEDICAID PATIENTS: I certify that this patient is under my care and that I had a face-to-face encounter that meets the physician face-to-face encounter requirements with this patient on this date. The encounter with the patient was in whole or in part for the following MEDICAL CONDITION: (primary reason for [place]) MEDICAL NECESSITY: I certify, that based on my findings, NURSING services are a medically necessary home health service. HOME BOUND STATUS: I certify that my clinical findings support that this patient is homebound (i.e., Due to illness or injury, pt requires aid of supportive devices such as crutches, cane, wheelchairs, walkers, the use of special transportation or the assistance of another person to leave their place of residence. There is a normal inability to leave the home and doing so requires considerable and taxing effort. Other absences are for medical reasons / religious services and are infrequent or of short duration when for other reasons). If current dressing causes regression in wound condition, may D/C ordered dressing product/s and apply Normal Saline Moist Dressing daily until next [place] / Other MD appointment. [place] of regression in wound condition at [phone]. Please direct any NON-WOUND  related issues/requests for orders to patient's Primary Care Physician Wound #7 Right,Medial Lower Leg: [place] for [place] Nurse may visit PRN to address patient s wound care needs. FACE TO FACE ENCOUNTER: MEDICARE and MEDICAID PATIENTS: I certify that this patient is under my care and that I had a face-to-face encounter that meets the physician face-to-face encounter requirements with this patient on this date. The encounter with the patient was in whole or in part for the following MEDICAL CONDITION: (primary reason for [place]) MEDICAL NECESSITY: I certify, that based on my findings, NURSING services are a medically necessary home health service. HOME BOUND STATUS: I certify that my clinical findings support that this patient is homebound (i.e., Due to illness or injury, pt requires aid of supportive devices such as crutches, cane, wheelchairs, walkers, the use of special transportation or the assistance of another person to leave their place of residence. There is a normal inability to leave the home and doing so requires considerable and taxing effort. Other absences are for medical reasons / religious services and are infrequent or of short duration when for other reasons). If current dressing causes regression in wound condition, may D/C ordered dressing product/s and apply Normal Saline Moist Dressing daily until next [place] / Other MD appointment. [place] of regression in wound condition at [phone]. Please direct any NON-WOUND related issues/requests for orders to patient's Primary Care Physician Medications-please add to medication list.: [name], [name] ([id number]) Wound #6 Left,Circumferential Lower Leg: P.O. Antibiotics - clindimicin Wound #7 Right,Medial Lower Leg: P.O. Antibiotics -  clindimicin I have recommended she apply hydrogel with silver over the ulceration and cover it with a light  Kerlix dressing. She can continue to wear her juxta light compression. she finds it very difficult to do her dressings and we will ask for home health to be there 2 times a week. She will see Dr. Quay Burow on Wednesday and hopefully by then the vascular reports will be in. Electronic Signature(s) Signed: 04/28/2015 11:49:03 AM By: Christin Fudge MD, FACS Entered By: Christin Fudge on 04/28/2015 11:49:03 [name] ([id number]) -------------------------------------------------------------------------------- SuperBill Details Patient Name: [name] Date of Service: 04/28/2015 Medical Record Number: [id number] Patient Account Number: [id number] Date of Birth/Sex: December 22, 1926 (81 y.o. 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 calf and inflammation I83.222 Varicose veins of left lower extremity with both ulcer of calf and inflammation L03.115 Cellulitis of right lower limb L03.116 Cellulitis of left lower limb I48.2 Chronic atrial fibrillation I87.2 Venous insufficiency (chronic) (peripheral) J44.9 Chronic obstructive pulmonary disease, unspecified N18.9 Chronic kidney disease, unspecified Facility Procedures CPT4 Code: [id number] Description: 99213 - WOUND CARE VISIT-LEV 3 EST PT Modifier: Quantity: 1 Physician Procedures CPT4: Description Modifier Quantity Code [id number] - WC PHYS LEVEL 3 - EST PT 1 ICD-10 Description Diagnosis I83.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

## 2015-04-29 NOTE — Progress Notes (Signed)
[name], [name] ([id number]) Visit Report for 04/28/2015 Arrival Information Details Patient Name: [name], [name]. Date of Service: 04/28/2015 11:00 AM Medical Record Number: [id number] Patient Account Number: [id number] Date of Birth/Sex: 06-04-1927 (81 y.o. Female) Treating RN: Montey Hora Primary Care Physician: Viviana Simpler Other Clinician: Referring Physician: Viviana Simpler Treating Physician/Extender: Frann Rider in Treatment: 1 Visit Information History Since Last Visit Added or deleted any medications: No Patient Arrived: Cane Any new allergies or adverse reactions: No Arrival Time: 11:08 Had a fall or experienced change in No Accompanied By: self activities of daily living that may affect Transfer Assistance: None risk of falls: Patient Identification Verified: Yes Signs or symptoms of abuse/neglect since last No Secondary Verification Process Yes visito Completed: Hospitalized since last visit: No Patient Requires Transmission- No Pain Present Now: Yes Based Precautions: Patient Has Alerts: Yes Patient Alerts: Patient on Blood Thinner Electronic Signature(s) Signed: 04/28/2015 5:24:55 PM By: Montey Hora Entered By: Montey Hora on 04/28/2015 11:09:15 [name] ([id number]) -------------------------------------------------------------------------------- Clinic Level of Care Assessment Details Patient Name: [name] Date of Service: 04/28/2015 11:00 AM Medical Record Number: [id number] Patient Account Number: [id number] Date of Birth/Sex: July 23, 1926 (81 y.o. Female) Treating RN: Montey Hora Primary Care Physician: Viviana Simpler Other Clinician: Referring Physician: Viviana Simpler Treating Physician/Extender: Frann Rider in Treatment: 1 Clinic Level of Care Assessment Items TOOL 4 Quantity Score []  - Use when only an EandM is performed on FOLLOW-UP visit 0 ASSESSMENTS - Nursing Assessment / Reassessment X -  Reassessment of Co-morbidities (includes updates in patient status) 1 10 X - Reassessment of Adherence to Treatment Plan 1 5 ASSESSMENTS - Wound and Skin Assessment / Reassessment []  - Simple Wound Assessment / Reassessment - one wound 0 X - Complex Wound Assessment / Reassessment - multiple wounds 2 5 []  - Dermatologic / Skin Assessment (not related to wound area) 0 ASSESSMENTS - Focused Assessment X - Circumferential Edema Measurements - multi extremities 2 5 []  - Nutritional Assessment / Counseling / Intervention 0 X - Lower Extremity Assessment (monofilament, tuning fork, pulses) 1 5 []  - Peripheral Arterial Disease Assessment (using hand held doppler) 0 ASSESSMENTS - Ostomy and/or Continence Assessment and Care []  - Incontinence Assessment and Management 0 []  - Ostomy Care Assessment and Management (repouching, etc.) 0 PROCESS - Coordination of Care X - Simple Patient / Family Education for ongoing care 1 15 []  - Complex (extensive) Patient / Family Education for ongoing care 0 []  - Staff obtains Programmer, systems, Records, Test Results / Process Orders 0 []  - Staff telephones HHA, Nursing Homes / Clarify orders / etc 0 []  - Routine Transfer to another Facility (non-emergent condition) 0 [name], [name] ([id number]) []  - Routine Hospital Admission (non-emergent condition) 0 []  - New Admissions / Biomedical engineer / Ordering NPWT, Apligraf, etc. 0 []  - Emergency Hospital Admission (emergent condition) 0 X - Simple Discharge Coordination 1 10 []  - Complex (extensive) Discharge Coordination 0 PROCESS - Special Needs []  - Pediatric / Minor Patient Management 0 []  - Isolation Patient Management 0 []  - Hearing / Language / Visual special needs 0 []  - Assessment of Community assistance (transportation, D/C planning, etc.) 0 []  - Additional assistance / Altered mentation 0 []  - Support Surface(s) Assessment (bed, cushion, seat, etc.) 0 INTERVENTIONS - Wound Cleansing / Measurement []  -  Simple Wound Cleansing - one wound 0 X - Complex Wound Cleansing - multiple wounds 2 5 X - Wound Imaging (photographs - any number of wounds) 1 5 []  -  Wound Tracing (instead of photographs) 0 []  - Simple Wound Measurement - one wound 0 X - Complex Wound Measurement - multiple wounds 2 5 INTERVENTIONS - Wound Dressings X - Small Wound Dressing one or multiple wounds 2 10 []  - Medium Wound Dressing one or multiple wounds 0 []  - Large Wound Dressing one or multiple wounds 0 []  - Application of Medications - topical 0 []  - Application of Medications - injection 0 INTERVENTIONS - Miscellaneous []  - External ear exam 0 [name], [name]. ([id number]) []  - Specimen Collection (cultures, biopsies, blood, body fluids, etc.) 0 []  - Specimen(s) / Culture(s) sent or taken to Lab for analysis 0 []  - Patient Transfer (multiple staff / Harrel Lemon Lift / Similar devices) 0 []  - Simple Staple / Suture removal (25 or less) 0 []  - Complex Staple / Suture removal (26 or more) 0 []  - Hypo / Hyperglycemic Management (close monitor of Blood Glucose) 0 []  - Ankle / Brachial Index (ABI) - do not check if billed separately 0 X - Vital Signs 1 5 Has the patient been seen at the hospital within the last three years: Yes Total Score: 115 Level Of Care: New/Established - Level 3 Electronic Signature(s) Signed: 04/28/2015 5:24:55 PM By: Montey Hora Entered By: Montey Hora on 04/28/2015 11:42:02 [name] ([id number]) -------------------------------------------------------------------------------- Encounter Discharge Information Details Patient Name: [name]. Date of Service: 04/28/2015 11:00 AM Medical Record Number: [id number] Patient Account Number: [id number] Date of Birth/Sex: 17-Nov-1926 (81 y.o. Female) Treating RN: Montey Hora Primary Care Physician: Viviana Simpler Other Clinician: Referring Physician: Viviana Simpler Treating Physician/Extender: Frann Rider in Treatment:  1 Encounter Discharge Information Items Discharge Pain Level: 0 Discharge Condition: Stable Ambulatory Status: Cane Discharge Destination: Home Transportation: Private Auto Accompanied By: self Schedule Follow-up Appointment: Yes Medication Reconciliation completed and provided to Patient/Care No Ronique Simerly: Provided on Clinical Summary of Care: 04/28/2015 Form Type Recipient Paper Patient JD Electronic Signature(s) Signed: 04/28/2015 5:04:02 PM By: Montey Hora Previous Signature: 04/28/2015 12:02:05 PM Version By: Ruthine Dose Entered By: Montey Hora on 04/28/2015 17:04:02 Tow, [name] ([id number]) -------------------------------------------------------------------------------- Lower Extremity Assessment Details Patient Name: [name]. Date of Service: 04/28/2015 11:00 AM Medical Record Number: [id number] Patient Account Number: [id number] Date of Birth/Sex: 08-02-1926 (81 y.o. Female) Treating RN: Montey Hora Primary Care Physician: Viviana Simpler Other Clinician: Referring Physician: Viviana Simpler Treating Physician/Extender: Frann Rider in Treatment: 1 Edema Assessment Assessed: [Left: No] [Right: No] Edema: [Left: Yes] [Right: Yes] Calf Left: Right: Point of Measurement: 35 cm From Medial Instep 34.8 cm 32.7 cm Ankle Left: Right: Point of Measurement: 10 cm From Medial Instep 22 cm 21.7 cm Vascular Assessment Pulses: Posterior Tibial Dorsalis Pedis Palpable: [Left:Yes] [Right:Yes] Extremity colors, hair growth, and conditions: Extremity Color: [Left:Mottled] [Right:Mottled] Hair Growth on Extremity: [Left:No] [Right:No] Temperature of Extremity: [Left:Warm] [Right:Warm] Capillary Refill: [Left:< 3 seconds] [Right:< 3 seconds] Electronic Signature(s) Signed: 04/28/2015 5:24:55 PM By: Montey Hora Entered By: Montey Hora on 04/28/2015 11:30:04 [name], [name]  ([id number]) -------------------------------------------------------------------------------- Multi Wound Chart Details Patient Name: [name]. Date of Service: 04/28/2015 11:00 AM Medical Record Number: [id number] Patient Account Number: [id number] Date of Birth/Sex: 1926-12-08 (81 y.o. Female) Treating RN: Montey Hora Primary Care Physician: Viviana Simpler Other Clinician: Referring Physician: Viviana Simpler Treating Physician/Extender: Frann Rider in Treatment: 1 Vital Signs Height(in): 62 Pulse(bpm): 89 Weight(lbs): 115 Blood Pressure 136/57 (mmHg): Body Mass Index(BMI): 21 Temperature(F): 98.3 Respiratory Rate 18 (breaths/min): Photos: [6:No Photos] [7:No Photos] [N/A:N/A] Wound Location: [6:Left Lower  Leg - Circumfernential] [7:Right Lower Leg - Medial N/A] Wounding Event: [6:Gradually Appeared] [7:Gradually Appeared] [N/A:N/A] Primary Etiology: [6:Venous Leg Ulcer] [7:Venous Leg Ulcer] [N/A:N/A] Comorbid History: [6:Cataracts, Anemia, Arrhythmia, Rheumatoid Arthritis, Osteoarthritis, Received Chemotherapy] [7:Cataracts, Anemia, Arrhythmia, Rheumatoid Arthritis, Osteoarthritis, Received Chemotherapy] [N/A:N/A] Date Acquired: [6:04/07/2015] [7:04/07/2015] [N/A:N/A] Weeks of Treatment: [6:1] [7:1] [N/A:N/A] Wound Status: [6:Open] [7:Open] [N/A:N/A] Measurements L x W x D 16.5x25x0.1 [7:15x16x0.1] [N/A:N/A] (cm) Area (cm) : [4:193.790] [7:188.496] [N/A:N/A] Volume (cm) : [6:32.398] [7:18.85] [N/A:N/A] % Reduction in Area: [6:-1045.80%] [7:-114.20%] [N/A:N/A] % Reduction in Volume: -1046.00% [7:-114.20%] [N/A:N/A] Classification: [6:Partial Thickness] [7:Partial Thickness] [N/A:N/A] Exudate Amount: [6:Medium] [7:Medium] [N/A:N/A] Exudate Type: [6:Serosanguineous] [7:Serosanguineous] [N/A:N/A] Exudate Color: [6:red, brown] [7:red, brown] [N/A:N/A] Wound Margin: [6:Distinct, outline attached] [7:Indistinct, nonvisible] [N/A:N/A] Granulation Amount:  [6:None Present (0%)] [7:None Present (0%)] [N/A:N/A] Necrotic Amount: [6:Large (67-100%)] [7:Large (67-100%)] [N/A:N/A] Necrotic Tissue: [6:Eschar] [7:Eschar] [N/A:N/A] Exposed Structures: [6:Fascia: No Fat: No Tendon: No Muscle: No] [7:Fascia: No Fat: No Tendon: No Muscle: No] [N/A:N/A] Joint: No Joint: No Bone: No Bone: No Limited to Skin Limited to Skin Breakdown Breakdown Epithelialization: None None N/A Periwound Skin Texture: Edema: Yes Edema: Yes N/A Excoriation: No Excoriation: No Induration: No Induration: No Callus: No Callus: No Crepitus: No Crepitus: No Fluctuance: No Fluctuance: No Friable: No Friable: No Rash: No Rash: No Scarring: No Scarring: No Periwound Skin Dry/Scaly: Yes Moist: Yes N/A Moisture: Maceration: No Maceration: No Moist: No Dry/Scaly: No Periwound Skin Color: Mottled: Yes Mottled: Yes N/A Pallor: Yes Pallor: Yes Atrophie Blanche: No Atrophie Blanche: No Cyanosis: No Cyanosis: No Ecchymosis: No Ecchymosis: No Erythema: No Erythema: No Hemosiderin Staining: No Hemosiderin Staining: No Rubor: No Rubor: No Temperature: No Abnormality No Abnormality N/A Tenderness on Yes Yes N/A Palpation: Wound Preparation: Ulcer Cleansing: Ulcer Cleansing: N/A Rinsed/Irrigated with Rinsed/Irrigated with Saline Saline Topical Anesthetic Topical Anesthetic Applied: None Applied: None Treatment Notes Electronic Signature(s) Signed: 04/28/2015 5:24:55 PM By: Montey Hora Entered By: Montey Hora on 04/28/2015 11:35:24 [name] ([id number]) -------------------------------------------------------------------------------- [place] Details Patient Name: [name]. Date of Service: 04/28/2015 11:00 AM Medical Record Number: [id number] Patient Account Number: [id number] Date of Birth/Sex: 08/31/1926 (81 y.o. Female) Treating RN: Montey Hora Primary Care Physician: Viviana Simpler Other  Clinician: Referring Physician: Viviana Simpler Treating Physician/Extender: Frann Rider in Treatment: 1 Active Inactive Orientation to the Wound Care Program Nursing Diagnoses: Knowledge deficit related to the wound healing center program Goals: Patient/caregiver will verbalize understanding of the [place] Program Date Initiated: 04/16/2015 Goal Status: Active Interventions: Provide education on orientation to the wound center Notes: Venous Leg Ulcer Nursing Diagnoses: Potential for venous Insuffiency (use before diagnosis confirmed) Goals: Patient will maintain optimal edema control Date Initiated: 04/16/2015 Goal Status: Active Patient/caregiver will verbalize understanding of disease process and disease management Date Initiated: 04/16/2015 Goal Status: Active Verify adequate tissue perfusion prior to therapeutic compression application Date Initiated: 04/16/2015 Goal Status: Active Interventions: Assess peripheral edema status every visit. Compression as ordered Provide education on venous insufficiency Notes: [name], [name] ([id number]) Wound/Skin Impairment Nursing Diagnoses: Impaired tissue integrity Knowledge deficit related to ulceration/compromised skin integrity Goals: Patient/caregiver will verbalize understanding of skin care regimen Date Initiated: 04/16/2015 Goal Status: Active Ulcer/skin breakdown will have a volume reduction of 30% by week 4 Date Initiated: 04/16/2015 Goal Status: Active Ulcer/skin breakdown will have a volume reduction of 50% by week 8 Date Initiated: 04/16/2015 Goal Status: Active Ulcer/skin breakdown will have a volume reduction of 80% by week 12 Date Initiated: 04/16/2015 Goal Status: Active  Ulcer/skin breakdown will heal within 14 weeks Date Initiated: 04/16/2015 Goal Status: Active Interventions: Assess patient/caregiver ability to obtain necessary supplies Assess patient/caregiver ability to  perform ulcer/skin care regimen upon admission and as needed Assess ulceration(s) every visit Provide education on ulcer and skin care Treatment Activities: Skin care regimen initiated : 04/28/2015 Topical wound management initiated : 04/28/2015 Notes: Electronic Signature(s) Signed: 04/28/2015 5:24:55 PM By: Montey Hora Entered By: Montey Hora on 04/28/2015 11:30:22 [name] ([id number]) -------------------------------------------------------------------------------- Pain Assessment Details Patient Name: [name]. Date of Service: 04/28/2015 11:00 AM Medical Record Number: [id number] Patient Account Number: [id number] Date of Birth/Sex: 18-Jan-1927 (81 y.o. Female) Treating RN: Montey Hora Primary Care Physician: Viviana Simpler Other Clinician: Referring Physician: Viviana Simpler Treating Physician/Extender: Frann Rider in Treatment: 1 Active Problems Location of Pain Severity and Description of Pain Patient Has Paino Yes Site Locations Pain Location: Pain in Ulcers With Dressing Change: Yes Duration of the Pain. Constant / Intermittento Constant Rate the pain. Current Pain Level: 10 Pain Management and Medication Current Pain Management: Electronic Signature(s) Signed: 04/28/2015 5:24:55 PM By: Montey Hora Entered By: Montey Hora on 04/28/2015 11:09:43 [name], [name] ([id number]) -------------------------------------------------------------------------------- Patient/Caregiver Education Details Patient Name: [name] Date of Service: 04/28/2015 11:00 AM Medical Record Number: [id number] Patient Account Number: [id number] Date of Birth/Gender: June 11, 1927 (81 y.o. Female) Treating RN: Montey Hora Primary Care Physician: Viviana Simpler Other Clinician: Referring Physician: Viviana Simpler Treating Physician/Extender: Frann Rider in Treatment: 1 Education Assessment Education Provided To: Patient Education  Topics Provided Wound/Skin Impairment: Handouts: Other: wound care as ordered Methods: Demonstration, Explain/Verbal Responses: State content correctly Electronic Signature(s) Signed: 04/28/2015 5:04:18 PM By: Montey Hora Entered By: Montey Hora on 04/28/2015 17:04:18 [name], [name] ([id number]) -------------------------------------------------------------------------------- Wound Assessment Details Patient Name: [name]. Date of Service: 04/28/2015 11:00 AM Medical Record Number: [id number] Patient Account Number: [id number] Date of Birth/Sex: [birth year] (81 y.o. Female) Treating RN: Montey Hora Primary Care Physician: Viviana Simpler Other Clinician: Referring Physician: Viviana Simpler Treating Physician/Extender: Frann Rider in Treatment: 1 Wound Status Wound Number: 6 Primary Venous Leg Ulcer Etiology: Wound Location: Left Lower Leg - Circumfernential Wound Open Status: Wounding Event: Gradually Appeared Comorbid Cataracts, Anemia, Arrhythmia, Date Acquired: 04/07/2015 History: Rheumatoid Arthritis, Osteoarthritis, Weeks Of Treatment: 1 Received Chemotherapy Clustered Wound: No Photos Photo Uploaded By: Gretta Cool, RN, BSN, Kim on 04/28/2015 17:17:53 Wound Measurements Length: (cm) 16.5 Width: (cm) 25 Depth: (cm) 0.1 Area: (cm) 323.977 Volume: (cm) 32.398 % Reduction in Area: -1045.8% % Reduction in Volume: -1046% Epithelialization: None Tunneling: No Undermining: No Wound Description Classification: Partial Thickness Wound Margin: Distinct, outline attached Exudate Amount: Medium Exudate Type: Serosanguineous Exudate Color: red, brown Foul Odor After Cleansing: No Wound Bed Granulation Amount: None Present (0%) Exposed Structure Necrotic Amount: Large (67-100%) Fascia Exposed: No Necrotic Quality: Eschar Fat Layer Exposed: No [name], [name] ([id number]) Tendon Exposed: No Muscle Exposed: No Joint Exposed: No Bone Exposed:  No Limited to Skin Breakdown Periwound Skin Texture Texture Color No Abnormalities Noted: No No Abnormalities Noted: No Callus: No Atrophie Blanche: No Crepitus: No Cyanosis: No Excoriation: No Ecchymosis: No Fluctuance: No Erythema: No Friable: No Hemosiderin Staining: No Induration: No Mottled: Yes Localized Edema: Yes Pallor: Yes Rash: No Rubor: No Scarring: No Temperature / Pain Moisture Temperature: No Abnormality No Abnormalities Noted: No Tenderness on Palpation: Yes Dry / Scaly: Yes Maceration: No Moist: No Wound Preparation Ulcer Cleansing: Rinsed/Irrigated with Saline Topical Anesthetic Applied: None Treatment Notes Wound #6 (Left, Circumferential Lower  Leg) 1. Cleansed with: Clean wound with Normal Saline 4. Dressing Applied: Hydrogel 5. Secondary Dressing Applied Bordered Foam Dressing 7. Secured with Other (specify in notes) Notes juxtalite Electronic Signature(s) Signed: 04/28/2015 5:24:55 PM By: Montey Hora Entered By: Montey Hora on 04/28/2015 11:28:32 [name] ([id number]) -------------------------------------------------------------------------------- Wound Assessment Details Patient Name: [name]. Date of Service: 04/28/2015 11:00 AM Medical Record Number: [id number] Patient Account Number: [id number] Date of Birth/Sex: 1926-07-27 (81 y.o. Female) Treating RN: Montey Hora Primary Care Physician: Viviana Simpler Other Clinician: Referring Physician: Viviana Simpler Treating Physician/Extender: Frann Rider in Treatment: 1 Wound Status Wound Number: 7 Primary Venous Leg Ulcer Etiology: Wound Location: Right Lower Leg - Medial Wound Open Wounding Event: Gradually Appeared Status: Date Acquired: 04/07/2015 Comorbid Cataracts, Anemia, Arrhythmia, Weeks Of Treatment: 1 History: Rheumatoid Arthritis, Osteoarthritis, Clustered Wound: No Received Chemotherapy Photos Photo Uploaded By: Gretta Cool, RN, BSN, Kim  on 04/28/2015 17:23:50 Wound Measurements Length: (cm) 15 Width: (cm) 16 Depth: (cm) 0.1 Area: (cm) 188.496 Volume: (cm) 18.85 % Reduction in Area: -114.2% % Reduction in Volume: -114.2% Epithelialization: None Tunneling: No Undermining: No Wound Description Classification: Partial Thickness Foul Odor Af Wound Margin: Indistinct, nonvisible Exudate Amount: Medium Exudate Type: Serosanguineous Exudate Color: red, brown ter Cleansing: No Wound Bed Granulation Amount: None Present (0%) Exposed Structure Necrotic Amount: Large (67-100%) Fascia Exposed: No Necrotic Quality: Eschar Fat Layer Exposed: No Tendon Exposed: No [name], [name] ([id number]) Muscle Exposed: No Joint Exposed: No Bone Exposed: No Limited to Skin Breakdown Periwound Skin Texture Texture Color No Abnormalities Noted: No No Abnormalities Noted: No Callus: No Atrophie Blanche: No Crepitus: No Cyanosis: No Excoriation: No Ecchymosis: No Fluctuance: No Erythema: No Friable: No Hemosiderin Staining: No Induration: No Mottled: Yes Localized Edema: Yes Pallor: Yes Rash: No Rubor: No Scarring: No Temperature / Pain Moisture Temperature: No Abnormality No Abnormalities Noted: No Tenderness on Palpation: Yes Dry / Scaly: No Maceration: No Moist: Yes Wound Preparation Ulcer Cleansing: Rinsed/Irrigated with Saline Topical Anesthetic Applied: None Treatment Notes Wound #7 (Right, Medial Lower Leg) 1. Cleansed with: Clean wound with Normal Saline 4. Dressing Applied: Hydrogel 5. Secondary Dressing Applied Bordered Foam Dressing 7. Secured with Other (specify in notes) Notes juxtalite Electronic Signature(s) Signed: 04/28/2015 5:24:55 PM By: Montey Hora Entered By: Montey Hora on 04/28/2015 11:28:54 [name] ([id number]) -------------------------------------------------------------------------------- [place] Details Patient Name: [name] Date of Service:  04/28/2015 11:00 AM Medical Record Number: [id number] Patient Account Number: [id number] Date of Birth/Sex: [birth year] (81 y.o. Female) Treating RN: Montey Hora Primary Care Physician: Viviana Simpler Other Clinician: Referring Physician: Viviana Simpler Treating Physician/Extender: Frann Rider in Treatment: 1 Vital Signs Time Taken: 11:09 Temperature (F): 98.3 Height (in): 62 Pulse (bpm): 89 Weight (lbs): 115 Respiratory Rate (breaths/min): 18 Body Mass Index (BMI): 21 Blood Pressure (mmHg): 136/57 Reference Range: 80 - 120 mg / dl Electronic Signature(s) Signed: 04/28/2015 5:24:55 PM By: Montey Hora Entered By: Montey Hora on 04/28/2015 11:12:07

## 2015-04-30 ENCOUNTER — Encounter: Payer: Medicare Other | Admitting: Surgery

## 2015-04-30 DIAGNOSIS — L97221 Non-pressure chronic ulcer of left calf limited to breakdown of skin: Secondary | ICD-10-CM | POA: Diagnosis not present

## 2015-05-01 NOTE — Progress Notes (Signed)
SHARMAN, GARROTT ([id number]) Visit Report for 04/30/2015 Chief Complaint Document Details Patient Name: [name], [name] 04/30/2015 11:45 Date of Service: AM Medical Record [id number] Number: Patient Account Number: [id number] 23-Apr-1927 (81 y.o. Treating RN: Montey Hora Date of Birth/Sex: Female) Other Clinician: Primary Care Physician: Viviana Simpler Treating BURNS III, Thayer Jew Referring Physician: Viviana Simpler Physician/Extender: Weeks in Treatment: 2 Information Obtained from: Patient Chief Complaint Bilateral calf ulcerations since early October 2016. Electronic Signature(s) Signed: 04/30/2015 3:59:00 PM By: Loletha Grayer MD Entered By: Loletha Grayer on 04/30/2015 13:25:34 [name] ([id number]) -------------------------------------------------------------------------------- HPI Details Patient Name: [name], [name] 04/30/2015 11:45 Date of Service: AM Medical Record [id number] Number: Patient Account Number: [id number] March 11, 1927 (81 y.o. Treating RN: Montey Hora Date of Birth/Sex: Female) Other Clinician: Primary Care Physician: Viviana Simpler Treating BURNS III, Amire Leazer Referring Physician: Viviana Simpler Physician/Extender: Weeks in Treatment: 2 History of Present Illness HPI Description: 81 year old with history of chronic venous insufficiency, atrial fibrillation (on Eliquis), COPD, and chronic kidney disease. No h/o DM or PAD. She developed bilateral calf ulcerations in early October 2016. Usually wears compression stockings but has not been wearing them since she developed the ulcerations. She reports significant pain with pressure. No ischemic rest pain or claudication. Pain improved with leg elevation. Culture 04/16/2015 grew MRSA sensitive to tetracycline, clindamycin, and Cipro. She completed a one- week course of doxycycline and has experienced worsening itching. Switched to clindamycin. Venous ultrasound 04/25/2015 showed no  evidence for DVT or significant venous reflux bilaterally. She has been applying mupirocin cream and hydrogel and using a juxtalite for edema control. Awaiting HH support. She returns to clinic for follow-up and says that her pain persists. Improved with leg elevation. No fever or chills. Minimal drainage. Electronic Signature(s) Signed: 04/30/2015 3:59:00 PM By: Loletha Grayer MD Entered By: Loletha Grayer on 04/30/2015 13:28:36 [name] ([id number]) -------------------------------------------------------------------------------- Physical Exam Details Patient Name: [name], [name] 04/30/2015 11:45 Date of Service: AM Medical Record [id number] Number: Patient Account Number: [id number] 06-28-1927 (81 y.o. Treating RN: Montey Hora Date of Birth/Sex: Female) Other Clinician: Primary Care Physician: Viviana Simpler Treating BURNS III, Jyden Kromer Referring Physician: Viviana Simpler Physician/Extender: Weeks in Treatment: 2 Constitutional . Pulse regular. Respirations normal and unlabored. Afebrile. Marland Kitchen Respiratory WNL. No retractions.. Cardiovascular Pedal Pulses WNL. Integumentary (Hair, Skin) .Marland Kitchen Neurological . Psychiatric Judgement and insight Intact.. Oriented times 3.. . Notes Bilateral lower extremity ulcerations covered with dry adherent eschar. Minimally improved on the left. Cellulitis resolved. One plus pitting edema. Palpable DP and PT bilaterally. Electronic Signature(s) Signed: 04/30/2015 3:59:00 PM By: Loletha Grayer MD Entered By: Loletha Grayer on 04/30/2015 13:29:47 [name] ([id number]) -------------------------------------------------------------------------------- Physician Orders Details Patient Name: [name], [name] 04/30/2015 11:45 Date of Service: AM Medical Record [id number] Number: Patient Account Number: [id number] 06/15/1927 (81 y.o. Treating RN: Montey Hora Date of Birth/Sex: Female) Other Clinician: Primary  Care Physician: Viviana Simpler Treating BURNS III, Kelie Gainey Referring Physician: Viviana Simpler Physician/Extender: Weeks in Treatment: 2 Verbal / Phone Orders: Yes Clinician: Montey Hora Read Back and Verified: Yes Diagnosis Coding Wound Cleansing Wound #6 Left,Circumferential Lower Leg o Clean wound with Normal Saline. Wound #7 Right,Medial Lower Leg o Clean wound with Normal Saline. Anesthetic Wound #6 Left,Circumferential Lower Leg o Topical Lidocaine 4% cream applied to wound bed prior to debridement Wound #7 Right,Medial Lower Leg o Topical Lidocaine 4% cream applied to wound bed prior to debridement Primary Wound Dressing Wound #6 Left,Circumferential Lower Leg o Bactroban - to open  red or yellow areas o Other: - betadine paint to black eschar Wound #7 Right,Medial Lower Leg o Bactroban - to open red or yellow areas o Other: - betadine paint to black eschar Secondary Dressing Wound #6 Left,Circumferential Lower Leg o ABD and Kerlix/Conform Wound #7 Right,Medial Lower Leg o ABD and Kerlix/Conform Dressing Change Frequency Wound #6 Left,Circumferential Lower Leg o Other: - Monday, Thursday and Friday by [facility] MARYLENE, MASEK ([id number]) Wound #7 Right,Medial Lower Leg o Other: - Monday, Thursday and Friday by [facility] Follow-up Appointments Wound #6 Left,Circumferential Lower Leg o Return Appointment in 1 week. Wound #7 Right,Medial Lower Leg o Return Appointment in 1 week. Edema Control Wound #6 Left,Circumferential Lower Leg o Patient to wear own Juxtalite/Juzo compression garment. o Tubigrip - tubigrip or juxtalite Wound #7 Right,Medial Lower Leg o Patient to wear own Juxtalite/Juzo compression garment. o Tubigrip - tubigrip or juxtalite Home Health Wound #6 Left,Circumferential Lower Leg o [place] Visits - [place] Nurse may visit PRN to address patientos wound care needs. o FACE TO FACE  ENCOUNTER: MEDICARE and MEDICAID PATIENTS: I certify that this patient is under my care and that I had a face-to-face encounter that meets the physician face-to-face encounter requirements with this patient on this date. The encounter with the patient was in whole or in part for the following MEDICAL CONDITION: (primary reason for [place]) MEDICAL NECESSITY: I certify, that based on my findings, NURSING services are a medically necessary home health service. HOME BOUND STATUS: I certify that my clinical findings support that this patient is homebound (i.e., Due to illness or injury, pt requires aid of supportive devices such as crutches, cane, wheelchairs, walkers, the use of special transportation or the assistance of another person to leave their place of residence. There is a normal inability to leave the home and doing so requires considerable and taxing effort. Other absences are for medical reasons / religious services and are infrequent or of short duration when for other reasons). o If current dressing causes regression in wound condition, may D/C ordered dressing product/s and apply Normal Saline Moist Dressing daily until next [place] / Other MD appointment. [place] of regression in wound condition at [phone]. o Please direct any NON-WOUND related issues/requests for orders to patient's Primary Care Physician Wound #7 Right,Medial Lower Leg o [place] Visits - [place] Nurse may visit PRN to address patientos wound care needs. o FACE TO FACE ENCOUNTER: MEDICARE and MEDICAID PATIENTS: I certify that this patient is under my care and that I had a face-to-face encounter that meets the physician face-to-face encounter requirements with this patient on this date. The encounter with the patient was in ARLYCE, CIRCLE. ([id number]) whole or in part for the following MEDICAL CONDITION: (primary reason for [place]) MEDICAL NECESSITY: I certify, that based on my findings, NURSING services are a medically necessary home health service. HOME BOUND STATUS: I certify that my clinical findings support that this patient is homebound (i.e., Due to illness or injury, pt requires aid of supportive devices such as crutches, cane, wheelchairs, walkers, the use of special transportation or the assistance of another person to leave their place of residence. There is a normal inability to leave the home and doing so requires considerable and taxing effort. Other absences are for medical reasons / religious services and are infrequent or of short duration when for other reasons). o If current dressing causes regression  in wound condition, may D/C ordered dressing product/s and apply Normal Saline Moist Dressing daily until next [place] / Other MD appointment. [place] of regression in wound condition at [phone]. o Please direct any NON-WOUND related issues/requests for orders to patient's Primary Care Physician Electronic Signature(s) Signed: 04/30/2015 3:59:00 PM By: Loletha Grayer MD Signed: 04/30/2015 5:13:41 PM By: Montey Hora Entered By: Montey Hora on 04/30/2015 12:11:50 [name] ([id number]) -------------------------------------------------------------------------------- Problem List Details Patient Name: [name], [name] 04/30/2015 11:45 Date of Service: AM Medical Record [id number] Number: Patient Account Number: [id number] 1926-12-18 (81 y.o. 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 04/16/2015 Yes calf and  inflammation I83.222 Varicose veins of left lower extremity with both ulcer of 04/16/2015 Yes calf and inflammation I48.2 Chronic atrial fibrillation 04/16/2015 Yes I87.2 Venous insufficiency (chronic) (peripheral) 04/16/2015 Yes J44.9 Chronic obstructive pulmonary disease, unspecified 04/16/2015 Yes N18.9 Chronic kidney disease, unspecified 04/16/2015 Yes M79.604 Pain in right leg 04/30/2015 Yes M79.605 Pain in left leg 04/30/2015 Yes Inactive Problems Resolved Problems ICD-10 Code Description Active Date Resolved Date KYLEAH, PENSABENE ([id number]) L03.115 Cellulitis of right lower limb 04/16/2015 04/16/2015 L03.116 Cellulitis of left lower limb 04/16/2015 04/16/2015 Electronic Signature(s) Signed: 04/30/2015 3:59:00 PM By: Loletha Grayer MD Entered By: Loletha Grayer on 04/30/2015 13:25:22 [name] ([id number]) -------------------------------------------------------------------------------- Progress Note Details Patient Name: [name], [name] 04/30/2015 11:45 Date of Service: AM Medical Record [id number] Number: Patient Account Number: [id number] January 06, 1927 (81 y.o. Treating RN: Montey Hora Date of Birth/Sex: Female) Other Clinician: Primary Care Physician: Viviana Simpler Treating BURNS III, Thayer Jew Referring Physician: Viviana Simpler Physician/Extender: Weeks in Treatment: 2 Subjective Chief Complaint Information obtained from Patient Bilateral calf ulcerations since early October 2016. History of Present Illness (HPI) 81 year old with history of chronic venous insufficiency, atrial fibrillation (on Eliquis), COPD, and chronic kidney disease. No h/o DM or PAD. She developed bilateral calf ulcerations in early October 2016. Usually wears compression stockings but has not been wearing them since she developed the ulcerations. She reports significant pain with pressure. No ischemic rest pain or claudication. Pain improved with leg elevation. Culture  04/16/2015 grew MRSA sensitive to tetracycline, clindamycin, and Cipro. She completed a one- week course of doxycycline and has experienced worsening itching. Switched to clindamycin. Venous ultrasound 04/25/2015 showed no evidence for DVT or significant venous reflux bilaterally. She has been applying mupirocin cream and hydrogel and using a juxtalite for edema control. Awaiting HH support. She returns to clinic for follow-up and says that her pain persists. Improved with leg elevation. No fever or chills. Minimal drainage. Objective Constitutional Pulse regular. Respirations normal and unlabored. Afebrile. Vitals Time Taken: 12:15 PM, Height: 62 in, Weight: 115 lbs, BMI: 21, Temperature: 97.9 F, Pulse: 73 bpm, Respiratory Rate: 18 breaths/min, Blood Pressure: 111/85 mmHg. Respiratory WNL. No retractions.Marland Kitchen HENRETTA, QUIST ([id number]) Cardiovascular Pedal Pulses WNL. Psychiatric Judgement and insight Intact.. Oriented times 3.. General Notes: Bilateral lower extremity ulcerations covered with dry adherent eschar. Minimally improved on the left. Cellulitis resolved. One plus pitting edema. Palpable DP and PT bilaterally. Integumentary (Hair, Skin) Wound #6 status is Open. Original cause of wound was Gradually Appeared. The wound is located on the Left,Circumferential Lower Leg. The wound measures 16.5cm length x 25cm width  x 0.1cm depth; 323.977cm^2 area and 32.398cm^3 volume. The wound is limited to skin breakdown. There is no tunneling or undermining noted. There is a medium amount of serosanguineous drainage noted. The wound margin is distinct with the outline attached to the wound base. There is medium (34-66%) red granulation within the wound bed. There is a medium (34-66%) amount of necrotic tissue within the wound bed including Eschar. The periwound skin appearance exhibited: Localized Edema, Dry/Scaly, Mottled, Pallor. The periwound skin appearance did not exhibit: Callus,  Crepitus, Excoriation, Fluctuance, Friable, Induration, Rash, Scarring, Maceration, Moist, Atrophie Blanche, Cyanosis, Ecchymosis, Hemosiderin Staining, Rubor, Erythema. Periwound temperature was noted as No Abnormality. The periwound has tenderness on palpation. Wound #7 status is Open. Original cause of wound was Gradually Appeared. The wound is located on the Right,Medial Lower Leg. The wound measures 15cm length x 16cm width x 0.1cm depth; 188.496cm^2 area and 18.85cm^3 volume. The wound is limited to skin breakdown. There is no tunneling or undermining noted. There is a medium amount of serosanguineous drainage noted. The wound margin is indistinct and nonvisible. There is small (1-33%) red granulation within the wound bed. There is a large (67-100%) amount of necrotic tissue within the wound bed including Eschar. The periwound skin appearance exhibited: Localized Edema, Moist, Mottled, Pallor. The periwound skin appearance did not exhibit: Callus, Crepitus, Excoriation, Fluctuance, Friable, Induration, Rash, Scarring, Dry/Scaly, Maceration, Atrophie Blanche, Cyanosis, Ecchymosis, Hemosiderin Staining, Rubor, Erythema. Periwound temperature was noted as No Abnormality. The periwound has tenderness on palpation. 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 ([id number]) N18.9 - Chronic kidney disease, unspecified M79.604 - Pain in right leg M79.605 - Pain in left leg Bilateral calf ulcerations of unclear etiology. Plan Wound Cleansing: Wound #6 Left,Circumferential Lower Leg: Clean wound with Normal Saline. Wound #7 Right,Medial Lower Leg: Clean wound  with Normal Saline. Anesthetic: Wound #6 Left,Circumferential Lower Leg: Topical Lidocaine 4% cream applied to wound bed prior to debridement Wound #7 Right,Medial Lower Leg: Topical Lidocaine 4% cream applied to wound bed prior to debridement Primary Wound Dressing: Wound #6 Left,Circumferential Lower Leg: Bactroban - to open red or yellow areas Other: - betadine paint to black eschar Wound #7 Right,Medial Lower Leg: Bactroban - to open red or yellow areas Other: - betadine paint to black eschar Secondary Dressing: Wound #6 Left,Circumferential Lower Leg: ABD and Kerlix/Conform Wound #7 Right,Medial Lower Leg: ABD and Kerlix/Conform Dressing Change Frequency: Wound #6 Left,Circumferential Lower Leg: Other: - Monday, Thursday and Friday by [facility] Wound #7 Right,Medial Lower Leg: Other: - Monday, Thursday and Friday by [facility] Follow-up Appointments: Wound #6 Left,Circumferential Lower Leg: Return Appointment in 1 week. Wound #7 Right,Medial Lower Leg: Return Appointment in 1 week. Edema Control: Wound #6 Left,Circumferential Lower Leg: Patient to wear own Juxtalite/Juzo compression garment. Tubigrip - tubigrip or juxtalite HENNIE, GOSA. ([id number]) Wound #7 Right,Medial Lower Leg: Patient to wear own Juxtalite/Juzo compression garment. Tubigrip - tubigrip or juxtalite Home Health: Wound #6 Left,Circumferential Lower Leg: Continue Home Health Visits - [place] Nurse may visit PRN to address patient s wound care needs. FACE TO FACE ENCOUNTER: MEDICARE and MEDICAID PATIENTS: I certify that this patient is under my care and that I had a face-to-face encounter that meets the  physician face-to-face encounter requirements with this patient on this date. The encounter with the patient was in whole or in part for the following MEDICAL CONDITION: (primary reason for [employer]) MEDICAL NECESSITY: I certify, that based on my findings, NURSING services are a medically  necessary home health service. HOME BOUND STATUS: I certify that my clinical findings support that this patient is homebound (i.e., Due to illness or injury, pt requires aid of supportive devices such as crutches, cane, wheelchairs, walkers, the use of special transportation or the assistance of another person to leave their place of residence. There is a normal inability to leave the home and doing so requires considerable and taxing effort. Other absences are for medical reasons / religious services and are infrequent or of short duration when for other reasons). If current dressing causes regression in wound condition, may D/C ordered dressing product/s and apply Normal Saline Moist Dressing daily until next [place] / Other MD appointment. [place] of regression in wound condition at [phone]. Please direct any NON-WOUND related issues/requests for orders to patient's Primary Care Physician Wound #7 Right,Medial Lower Leg: [place] Visits - [place] Nurse may visit PRN to address patient s wound care needs. FACE TO FACE ENCOUNTER: MEDICARE and MEDICAID PATIENTS: I certify that this patient is under my care and that I had a face-to-face encounter that meets the physician face-to-face encounter requirements with this patient on this date. The encounter with the patient was in whole or in part for the following MEDICAL CONDITION: (primary reason for [place]) MEDICAL NECESSITY: I certify, that based on my findings, NURSING services are a medically necessary home health service. HOME BOUND STATUS: I certify that my clinical findings support that this patient is homebound (i.e., Due to illness or injury, pt requires aid of supportive devices such as crutches, cane, wheelchairs, walkers, the use of special transportation or the assistance of another person to leave their place of residence. There is a normal inability to leave the  home and doing so requires considerable and taxing effort. Other absences are for medical reasons / religious services and are infrequent or of short duration when for other reasons). If current dressing causes regression in wound condition, may D/C ordered dressing product/s and apply Normal Saline Moist Dressing daily until next [place] / Other MD appointment. [place] of regression in wound condition at [phone]. Please direct any NON-WOUND related issues/requests for orders to patient's Primary Care Physician Mupirocin cream. Edema control with juxtalite or Tubigrip. Complete clindamycin. TRESSY, KUNZMAN ([id number]) Dermatology consult. Electronic Signature(s) Signed: 04/30/2015 3:59:00 PM By: Loletha Grayer MD Entered By: Loletha Grayer on 04/30/2015 13:30:37 Kosh, Leward Quan ([id number]) -------------------------------------------------------------------------------- SuperBill Details Patient Name: [name] Date of Service: 04/30/2015 Medical Record Number: [id number] Patient Account Number: [id number] Date of Birth/Sex: [birth year] (81 y.o. 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 calf and inflammation I83.222 Varicose veins of left lower extremity with both ulcer of calf and inflammation I48.2 Chronic atrial fibrillation I87.2 Venous insufficiency (chronic) (peripheral) J44.9 Chronic obstructive pulmonary disease, unspecified N18.9 Chronic kidney disease, unspecified M79.604 Pain in right leg M79.605 Pain in left leg Facility Procedures CPT4 Code: [id number] Description: 99213 - WOUND CARE VISIT-LEV 3 EST PT Modifier:  Quantity: 1 Physician  Procedures CPT4: Description Modifier Quantity Code [id number] 97847 - WC PHYS LEVEL 3 - EST PT 1 ICD-10 Description Diagnosis I83.212 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

## 2015-05-01 NOTE — Progress Notes (Signed)
[name] ([id number]) Visit Report for 04/30/2015 Arrival Information Details Patient Name: [name], [name]. Date of Service: 04/30/2015 11:45 AM Medical Record Number: [id number] Patient Account Number: [id number] Date of Birth/Sex: Mar 02, 1927 (81 y.o. Female) Treating RN: Montey Hora Primary Care Physician: Viviana Simpler Other Clinician: Referring Physician: Viviana Simpler Treating Physician/Extender: BURNS III, Charlean Sanfilippo in Treatment: 2 Visit Information History Since Last Visit Added or deleted any medications: No Patient Arrived: Cane Any new allergies or adverse reactions: No Arrival Time: 11:58 Had a fall or experienced change in No Accompanied By: self activities of daily living that may affect Transfer Assistance: None risk of falls: Patient Identification Verified: Yes Signs or symptoms of abuse/neglect since last No Secondary Verification Process Yes visito Completed: Hospitalized since last visit: No Patient Requires Transmission- No Pain Present Now: Yes Based Precautions: Patient Has Alerts: Yes Patient Alerts: Patient on Blood Thinner Electronic Signature(s) Signed: 04/30/2015 5:13:41 PM By: Montey Hora Entered By: Montey Hora on 04/30/2015 11:59:07 Vanevery, Leward Quan ([id number]) -------------------------------------------------------------------------------- Clinic Level of Care Assessment Details Patient Name: [name] Date of Service: 04/30/2015 11:45 AM Medical Record Number: [id number] Patient Account Number: [id number] Date of Birth/Sex: [birth year] (81 y.o. Female) Treating RN: Montey Hora Primary Care Physician: Viviana Simpler Other Clinician: Referring Physician: Viviana Simpler Treating Physician/Extender: BURNS III, Charlean Sanfilippo in Treatment: 2 Clinic Level of Care Assessment Items TOOL 4 Quantity Score []  - Use when only an EandM is performed on FOLLOW-UP visit 0 ASSESSMENTS - Nursing Assessment /  Reassessment X - Reassessment of Co-morbidities (includes updates in patient status) 1 10 X - Reassessment of Adherence to Treatment Plan 1 5 ASSESSMENTS - Wound and Skin Assessment / Reassessment []  - Simple Wound Assessment / Reassessment - one wound 0 X - Complex Wound Assessment / Reassessment - multiple wounds 2 5 []  - Dermatologic / Skin Assessment (not related to wound area) 0 ASSESSMENTS - Focused Assessment []  - Circumferential Edema Measurements - multi extremities 0 []  - Nutritional Assessment / Counseling / Intervention 0 X - Lower Extremity Assessment (monofilament, tuning fork, pulses) 1 5 []  - Peripheral Arterial Disease Assessment (using hand held doppler) 0 ASSESSMENTS - Ostomy and/or Continence Assessment and Care []  - Incontinence Assessment and Management 0 []  - Ostomy Care Assessment and Management (repouching, etc.) 0 PROCESS - Coordination of Care X - Simple Patient / Family Education for ongoing care 1 15 []  - Complex (extensive) Patient / Family Education for ongoing care 0 []  - Staff obtains Programmer, systems, Records, Test Results / Process Orders 0 []  - Staff telephones HHA, Nursing Homes / Clarify orders / etc 0 []  - Routine Transfer to another Facility (non-emergent condition) 0 LOGAN, VEGH ([id number]) []  - Routine Hospital Admission (non-emergent condition) 0 []  - New Admissions / Biomedical engineer / Ordering NPWT, Apligraf, etc. 0 []  - Emergency Hospital Admission (emergent condition) 0 X - Simple Discharge Coordination 1 10 []  - Complex (extensive) Discharge Coordination 0 PROCESS - Special Needs []  - Pediatric / Minor Patient Management 0 []  - Isolation Patient Management 0 []  - Hearing / Language / Visual special needs 0 []  - Assessment of Community assistance (transportation, D/C planning, etc.) 0 []  - Additional assistance / Altered mentation 0 []  - Support Surface(s) Assessment (bed, cushion, seat, etc.) 0 INTERVENTIONS - Wound Cleansing /  Measurement []  - Simple Wound Cleansing - one wound 0 X - Complex Wound Cleansing - multiple wounds 2 5 X - Wound Imaging (photographs - any number of wounds) 1 5 []  -  Wound Tracing (instead of photographs) 0 []  - Simple Wound Measurement - one wound 0 X - Complex Wound Measurement - multiple wounds 2 5 INTERVENTIONS - Wound Dressings X - Small Wound Dressing one or multiple wounds 2 10 []  - Medium Wound Dressing one or multiple wounds 0 []  - Large Wound Dressing one or multiple wounds 0 X - Application of Medications - topical 1 5 []  - Application of Medications - injection 0 INTERVENTIONS - Miscellaneous []  - External ear exam 0 TEYANNA, THIELMAN. ([id number]) []  - Specimen Collection (cultures, biopsies, blood, body fluids, etc.) 0 []  - Specimen(s) / Culture(s) sent or taken to Lab for analysis 0 []  - Patient Transfer (multiple staff / Harrel Lemon Lift / Similar devices) 0 []  - Simple Staple / Suture removal (25 or less) 0 []  - Complex Staple / Suture removal (26 or more) 0 []  - Hypo / Hyperglycemic Management (close monitor of Blood Glucose) 0 []  - Ankle / Brachial Index (ABI) - do not check if billed separately 0 X - Vital Signs 1 5 Has the patient been seen at the hospital within the last three years: Yes Total Score: 110 Level Of Care: New/Established - Level 3 Electronic Signature(s) Signed: 04/30/2015 4:34:10 PM By: Montey Hora Entered By: Montey Hora on 04/30/2015 16:34:09 [name] ([id number]) -------------------------------------------------------------------------------- Encounter Discharge Information Details Patient Name: [name]. Date of Service: 04/30/2015 11:45 AM Medical Record Number: [id number] Patient Account Number: [id number] Date of Birth/Sex: [birth year] (81 y.o. Female) Treating RN: Montey Hora Primary Care Physician: Viviana Simpler Other Clinician: Referring Physician: Viviana Simpler Treating Physician/Extender: BURNS III,  Charlean Sanfilippo in Treatment: 2 Encounter Discharge Information Items Discharge Pain Level: 0 Discharge Condition: Stable Ambulatory Status: Cane Discharge Destination: Home Transportation: Private Auto Accompanied By: self Schedule Follow-up Appointment: Yes Medication Reconciliation completed and provided to Patient/Care No Yasmine Kilbourne: Provided on Clinical Summary of Care: 04/30/2015 Form Type Recipient Paper Patient JD Electronic Signature(s) Signed: 04/30/2015 4:36:00 PM By: Montey Hora Previous Signature: 04/30/2015 12:37:32 PM Version By: Ruthine Dose Entered By: Montey Hora on 04/30/2015 16:35:59 Ipock, Leward Quan ([id number]) -------------------------------------------------------------------------------- Multi Wound Chart Details Patient Name: [name]. Date of Service: 04/30/2015 11:45 AM Medical Record Number: [id number] Patient Account Number: [id number] Date of Birth/Sex: 1926-12-14 (81 y.o. Female) Treating RN: Montey Hora Primary Care Physician: Viviana Simpler Other Clinician: Referring Physician: Viviana Simpler Treating Physician/Extender: BURNS III, Charlean Sanfilippo in Treatment: 2 Wound Assessments Treatment Notes Electronic Signature(s) Signed: 04/30/2015 5:13:41 PM By: Montey Hora Entered By: Montey Hora on 04/30/2015 12:02:16 [name] ([id number]) -------------------------------------------------------------------------------- [place] Details Patient Name: MANDALYN, PASQUA. Date of Service: 04/30/2015 11:45 AM Medical Record Number: [id number] Patient Account Number: [id number] Date of Birth/Sex: May 19, 1927 (81 y.o. Female) Treating RN: Montey Hora Primary Care Physician: Viviana Simpler Other Clinician: Referring Physician: Viviana Simpler Treating Physician/Extender: BURNS III, Charlean Sanfilippo in Treatment: 2 Active Inactive Orientation to the Wound Care Program Nursing Diagnoses: Knowledge deficit  related to the wound healing center program Goals: Patient/caregiver will verbalize understanding of the [place] Program Date Initiated: 04/16/2015 Goal Status: Active Interventions: Provide education on orientation to the wound center Notes: Venous Leg Ulcer Nursing Diagnoses: Potential for venous Insuffiency (use before diagnosis confirmed) Goals: Patient will maintain optimal edema control Date Initiated: 04/16/2015 Goal Status: Active Patient/caregiver will verbalize understanding of disease process and disease management Date Initiated: 04/16/2015 Goal Status: Active Verify adequate tissue perfusion prior to therapeutic compression application Date Initiated: 04/16/2015 Goal Status: Active Interventions: Assess  peripheral edema status every visit. Compression as ordered Provide education on venous insufficiency Notes: BRIT, CARBONELL ([id number]) Wound/Skin Impairment Nursing Diagnoses: Impaired tissue integrity Knowledge deficit related to ulceration/compromised skin integrity Goals: Patient/caregiver will verbalize understanding of skin care regimen Date Initiated: 04/16/2015 Goal Status: Active Ulcer/skin breakdown will have a volume reduction of 30% by week 4 Date Initiated: 04/16/2015 Goal Status: Active Ulcer/skin breakdown will have a volume reduction of 50% by week 8 Date Initiated: 04/16/2015 Goal Status: Active Ulcer/skin breakdown will have a volume reduction of 80% by week 12 Date Initiated: 04/16/2015 Goal Status: Active Ulcer/skin breakdown will heal within 14 weeks Date Initiated: 04/16/2015 Goal Status: Active Interventions: Assess patient/caregiver ability to obtain necessary supplies Assess patient/caregiver ability to perform ulcer/skin care regimen upon admission and as needed Assess ulceration(s) every visit Provide education on ulcer and skin care Treatment Activities: Skin care regimen initiated : 04/30/2015 Topical wound  management initiated : 04/30/2015 Notes: Electronic Signature(s) Signed: 04/30/2015 5:13:41 PM By: Montey Hora Entered By: Montey Hora on 04/30/2015 12:02:07 [name] ([id number]) -------------------------------------------------------------------------------- Pain Assessment Details Patient Name: [name]. Date of Service: 04/30/2015 11:45 AM Medical Record Number: [id number] Patient Account Number: [id number] Date of Birth/Sex: 01-24-1927 (81 y.o. Female) Treating RN: Montey Hora Primary Care Physician: Viviana Simpler Other Clinician: Referring Physician: Viviana Simpler Treating Physician/Extender: BURNS III, Charlean Sanfilippo in Treatment: 2 Active Problems Location of Pain Severity and Description of Pain Patient Has Paino Yes Site Locations Pain Location: Pain in Ulcers With Dressing Change: Yes Duration of the Pain. Constant / Intermittento Constant Character of Pain Describe the Pain: Aching Pain Management and Medication Current Pain Management: Electronic Signature(s) Signed: 04/30/2015 5:13:41 PM By: Montey Hora Entered By: Montey Hora on 04/30/2015 12:01:53 [name] ([id number]) -------------------------------------------------------------------------------- Patient/Caregiver Education Details Patient Name: [name]. Date of Service: 04/30/2015 11:45 AM Medical Record Number: [id number] Patient Account Number: [id number] Date of Birth/Gender: Nov 27, 1926 (81 y.o. Female) Treating RN: Montey Hora Primary Care Physician: Viviana Simpler Other Clinician: Referring Physician: Viviana Simpler Treating Physician/Extender: BURNS III, Charlean Sanfilippo in Treatment: 2 Education Assessment Education Provided To: Patient Education Topics Provided Wound/Skin Impairment: Handouts: Other: wound care as ordered Methods: Demonstration, Explain/Verbal Responses: State content correctly Electronic Signature(s) Signed: 04/30/2015  5:13:41 PM By: Montey Hora Entered By: Montey Hora on 04/30/2015 95:18:84 [name] ([id number]) -------------------------------------------------------------------------------- Wound Assessment Details Patient Name: [name]. Date of Service: 04/30/2015 11:45 AM Medical Record Number: [id number] Patient Account Number: [id number] Date of Birth/Sex: 14-Aug-1926 (81 y.o. Female) Treating RN: Montey Hora Primary Care Physician: Viviana Simpler Other Clinician: Referring Physician: Viviana Simpler Treating Physician/Extender: BURNS III, Charlean Sanfilippo in Treatment: 2 Wound Status Wound Number: 6 Primary Venous Leg Ulcer Etiology: Wound Location: Left Lower Leg - Circumfernential Wound Open Status: Wounding Event: Gradually Appeared Comorbid Cataracts, Anemia, Arrhythmia, Date Acquired: 04/07/2015 History: Rheumatoid Arthritis, Osteoarthritis, Weeks Of Treatment: 2 Received Chemotherapy Clustered Wound: No Photos Photo Uploaded By: Gretta Cool, RN, BSN, Kim on 04/30/2015 17:05:08 Wound Measurements Length: (cm) 16.5 Width: (cm) 25 Depth: (cm) 0.1 Area: (cm) 323.977 Volume: (cm) 32.398 % Reduction in Area: -1045.8% % Reduction in Volume: -1046% Epithelialization: None Tunneling: No Undermining: No Wound Description Classification: Partial Thickness Wound Margin: Distinct, outline attached Exudate Amount: Medium Exudate Type: Serosanguineous Exudate Color: red, brown Foul Odor After Cleansing: No Wound Bed Granulation Amount: Medium (34-66%) Exposed Structure Granulation Quality: Red Fascia Exposed: No Necrotic Amount: Medium (34-66%) Fat Layer Exposed: No MARIZA, BOURGET ([id number]) Necrotic Quality: Eschar Tendon Exposed:  No Muscle Exposed: No Joint Exposed: No Bone Exposed: No Limited to Skin Breakdown Periwound Skin Texture Texture Color No Abnormalities Noted: No No Abnormalities Noted: No Callus: No Atrophie Blanche: No Crepitus:  No Cyanosis: No Excoriation: No Ecchymosis: No Fluctuance: No Erythema: No Friable: No Hemosiderin Staining: No Induration: No Mottled: Yes Localized Edema: Yes Pallor: Yes Rash: No Rubor: No Scarring: No Temperature / Pain Moisture Temperature: No Abnormality No Abnormalities Noted: No Tenderness on Palpation: Yes Dry / Scaly: Yes Maceration: No Moist: No Wound Preparation Ulcer Cleansing: Rinsed/Irrigated with Saline Topical Anesthetic Applied: None Treatment Notes Wound #6 (Left, Circumferential Lower Leg) 1. Cleansed with: Clean wound with Normal Saline 2. Anesthetic Topical Lidocaine 4% cream to wound bed prior to debridement 4. Dressing Applied: Other dressing (specify in notes) 5. Secondary Dressing Applied ABD and Kerlix/Conform Notes betadine paint to black eschar and bactroban to wet open areas Electronic Signature(s) Signed: 04/30/2015 5:13:41 PM By: Montey Hora Entered By: Montey Hora on 04/30/2015 12:05:23 [name] ([id number]) -------------------------------------------------------------------------------- Wound Assessment Details Patient Name: [name]. Date of Service: 04/30/2015 11:45 AM Medical Record Number: [id number] Patient Account Number: [id number] Date of Birth/Sex: 08-06-1926 (81 y.o. Female) Treating RN: Montey Hora Primary Care Physician: Viviana Simpler Other Clinician: Referring Physician: Viviana Simpler Treating Physician/Extender: BURNS III, Charlean Sanfilippo in Treatment: 2 Wound Status Wound Number: 7 Primary Venous Leg Ulcer Etiology: Wound Location: Right Lower Leg - Medial Wound Open Wounding Event: Gradually Appeared Status: Date Acquired: 04/07/2015 Comorbid Cataracts, Anemia, Arrhythmia, Weeks Of Treatment: 2 History: Rheumatoid Arthritis, Osteoarthritis, Clustered Wound: No Received Chemotherapy Photos Photo Uploaded By: Gretta Cool, RN, BSN, Kim on 04/30/2015 17:06:30 Wound Measurements Length:  (cm) 15 Width: (cm) 16 Depth: (cm) 0.1 Area: (cm) 188.496 Volume: (cm) 18.85 % Reduction in Area: -114.2% % Reduction in Volume: -114.2% Epithelialization: None Tunneling: No Undermining: No Wound Description Classification: Partial Thickness Foul Odor Af Wound Margin: Indistinct, nonvisible Exudate Amount: Medium Exudate Type: Serosanguineous Exudate Color: red, brown ter Cleansing: No Wound Bed Granulation Amount: Small (1-33%) Exposed Structure Granulation Quality: Red Fascia Exposed: No Necrotic Amount: Large (67-100%) Fat Layer Exposed: No Necrotic Quality: Eschar Tendon Exposed: No SHAQUASIA, CAPONIGRO ([id number]) Muscle Exposed: No Joint Exposed: No Bone Exposed: No Limited to Skin Breakdown Periwound Skin Texture Texture Color No Abnormalities Noted: No No Abnormalities Noted: No Callus: No Atrophie Blanche: No Crepitus: No Cyanosis: No Excoriation: No Ecchymosis: No Fluctuance: No Erythema: No Friable: No Hemosiderin Staining: No Induration: No Mottled: Yes Localized Edema: Yes Pallor: Yes Rash: No Rubor: No Scarring: No Temperature / Pain Moisture Temperature: No Abnormality No Abnormalities Noted: No Tenderness on Palpation: Yes Dry / Scaly: No Maceration: No Moist: Yes Wound Preparation Ulcer Cleansing: Rinsed/Irrigated with Saline Topical Anesthetic Applied: None Treatment Notes Wound #7 (Right, Medial Lower Leg) 1. Cleansed with: Clean wound with Normal Saline 2. Anesthetic Topical Lidocaine 4% cream to wound bed prior to debridement 4. Dressing Applied: Other dressing (specify in notes) 5. Secondary Dressing Applied ABD and Kerlix/Conform Notes betadine paint to black eschar and bactroban to wet open areas Electronic Signature(s) Signed: 04/30/2015 5:13:41 PM By: Montey Hora Entered By: Montey Hora on 04/30/2015 12:05:42 [name]  ([id number]) -------------------------------------------------------------------------------- [place] Details Patient Name: [name]. Date of Service: 04/30/2015 11:45 AM Medical Record Number: [id number] Patient Account Number: [id number] Date of Birth/Sex: 1927/03/06 (81 y.o. Female) Treating RN: Montey Hora Primary Care Physician: Viviana Simpler Other Clinician: Referring Physician: Viviana Simpler Treating Physician/Extender: BURNS III, Charlean Sanfilippo in Treatment: 2  Vital Signs Time Taken: 12:15 Temperature (F): 97.9 Height (in): 62 Pulse (bpm): 73 Weight (lbs): 115 Respiratory Rate (breaths/min): 18 Body Mass Index (BMI): 21 Blood Pressure (mmHg): 111/85 Reference Range: 80 - 120 mg / dl Electronic Signature(s) Signed: 04/30/2015 5:13:41 PM By: Montey Hora Entered By: Montey Hora on 04/30/2015 12:16:02

## 2015-05-05 ENCOUNTER — Telehealth: Payer: Self-pay | Admitting: Internal Medicine

## 2015-05-05 ENCOUNTER — Ambulatory Visit (INDEPENDENT_AMBULATORY_CARE_PROVIDER_SITE_OTHER): Payer: Medicare Other | Admitting: Podiatry

## 2015-05-05 ENCOUNTER — Encounter: Payer: Self-pay | Admitting: Podiatry

## 2015-05-05 DIAGNOSIS — B351 Tinea unguium: Secondary | ICD-10-CM

## 2015-05-05 DIAGNOSIS — M360 Dermato(poly)myositis in neoplastic disease: Secondary | ICD-10-CM

## 2015-05-05 DIAGNOSIS — M79676 Pain in unspecified toe(s): Secondary | ICD-10-CM | POA: Diagnosis not present

## 2015-05-05 NOTE — Telephone Encounter (Signed)
TFC had pt with [facility] and that is why they told her she needed referral. Pt has Medicare and [employer] supplement. Pt is aware of her appt today 05/05/15 at 1030 with Dr. Milinda Pointer at [facility]

## 2015-05-05 NOTE — Telephone Encounter (Signed)
Patient called and said Dr.Hyatt's office told her she would have to have a referral from Dr.Letvak to make an appointment.  Patient needs her toenails trimmed and would like to have it done today.

## 2015-05-05 NOTE — Progress Notes (Signed)
She presents today with a chief complaint of painful toenails bilateral. She is being treated by the wound center for ulcerations medial ankles bilateral.  Objective: Vital signs are stable she is alert and oriented 3. Pulses are palpable. Her toenails are thick yellow dystrophic onychomycotic. Feet are slightly swollen. Bandages around the ankles with compression devices.  Assessment: Ulceration bilateral ankles with pain in limb secondary to onychomycosis bilateral.  Plan: Debridement of nails 1 through 5 bilateral. No open lesions on the feet.

## 2015-05-07 ENCOUNTER — Encounter: Payer: Medicare Other | Attending: Surgery | Admitting: Surgery

## 2015-05-07 DIAGNOSIS — M79604 Pain in right leg: Secondary | ICD-10-CM | POA: Insufficient documentation

## 2015-05-07 DIAGNOSIS — I872 Venous insufficiency (chronic) (peripheral): Secondary | ICD-10-CM | POA: Insufficient documentation

## 2015-05-07 DIAGNOSIS — M79605 Pain in left leg: Secondary | ICD-10-CM | POA: Insufficient documentation

## 2015-05-07 DIAGNOSIS — J449 Chronic obstructive pulmonary disease, unspecified: Secondary | ICD-10-CM | POA: Insufficient documentation

## 2015-05-07 DIAGNOSIS — I83222 Varicose veins of left lower extremity with both ulcer of calf and inflammation: Secondary | ICD-10-CM | POA: Insufficient documentation

## 2015-05-07 DIAGNOSIS — L97221 Non-pressure chronic ulcer of left calf limited to breakdown of skin: Secondary | ICD-10-CM | POA: Insufficient documentation

## 2015-05-07 DIAGNOSIS — L97211 Non-pressure chronic ulcer of right calf limited to breakdown of skin: Secondary | ICD-10-CM | POA: Insufficient documentation

## 2015-05-07 DIAGNOSIS — N189 Chronic kidney disease, unspecified: Secondary | ICD-10-CM | POA: Insufficient documentation

## 2015-05-07 DIAGNOSIS — Z87891 Personal history of nicotine dependence: Secondary | ICD-10-CM | POA: Insufficient documentation

## 2015-05-07 DIAGNOSIS — I83212 Varicose veins of right lower extremity with both ulcer of calf and inflammation: Secondary | ICD-10-CM | POA: Insufficient documentation

## 2015-05-07 DIAGNOSIS — I482 Chronic atrial fibrillation: Secondary | ICD-10-CM | POA: Insufficient documentation

## 2015-05-07 DIAGNOSIS — I129 Hypertensive chronic kidney disease with stage 1 through stage 4 chronic kidney disease, or unspecified chronic kidney disease: Secondary | ICD-10-CM | POA: Insufficient documentation

## 2015-05-07 DIAGNOSIS — M069 Rheumatoid arthritis, unspecified: Secondary | ICD-10-CM | POA: Insufficient documentation

## 2015-05-08 ENCOUNTER — Telehealth: Payer: Self-pay | Admitting: Internal Medicine

## 2015-05-08 ENCOUNTER — Inpatient Hospital Stay
Admission: EM | Admit: 2015-05-08 | Discharge: 2015-05-13 | DRG: 579 | Disposition: A | Discharge status: Skilled Nursing Facility | Payer: Medicare Other | Attending: Internal Medicine | Admitting: Internal Medicine

## 2015-05-08 ENCOUNTER — Encounter: Payer: Self-pay | Admitting: Emergency Medicine

## 2015-05-08 DIAGNOSIS — L97911 Non-pressure chronic ulcer of unspecified part of right lower leg limited to breakdown of skin: Secondary | ICD-10-CM | POA: Diagnosis present

## 2015-05-08 DIAGNOSIS — I5032 Chronic diastolic (congestive) heart failure: Secondary | ICD-10-CM | POA: Diagnosis present

## 2015-05-08 DIAGNOSIS — L03115 Cellulitis of right lower limb: Secondary | ICD-10-CM | POA: Diagnosis not present

## 2015-05-08 DIAGNOSIS — E039 Hypothyroidism, unspecified: Secondary | ICD-10-CM | POA: Diagnosis present

## 2015-05-08 DIAGNOSIS — N170 Acute kidney failure with tubular necrosis: Secondary | ICD-10-CM | POA: Diagnosis present

## 2015-05-08 DIAGNOSIS — N189 Chronic kidney disease, unspecified: Secondary | ICD-10-CM | POA: Diagnosis present

## 2015-05-08 DIAGNOSIS — B9562 Methicillin resistant Staphylococcus aureus infection as the cause of diseases classified elsewhere: Secondary | ICD-10-CM | POA: Diagnosis present

## 2015-05-08 DIAGNOSIS — K219 Gastro-esophageal reflux disease without esophagitis: Secondary | ICD-10-CM | POA: Diagnosis present

## 2015-05-08 DIAGNOSIS — D72829 Elevated white blood cell count, unspecified: Secondary | ICD-10-CM | POA: Diagnosis present

## 2015-05-08 DIAGNOSIS — B9689 Other specified bacterial agents as the cause of diseases classified elsewhere: Secondary | ICD-10-CM | POA: Diagnosis present

## 2015-05-08 DIAGNOSIS — L97929 Non-pressure chronic ulcer of unspecified part of left lower leg with unspecified severity: Secondary | ICD-10-CM

## 2015-05-08 DIAGNOSIS — L97919 Non-pressure chronic ulcer of unspecified part of right lower leg with unspecified severity: Secondary | ICD-10-CM | POA: Diagnosis not present

## 2015-05-08 DIAGNOSIS — I130 Hypertensive heart and chronic kidney disease with heart failure and stage 1 through stage 4 chronic kidney disease, or unspecified chronic kidney disease: Secondary | ICD-10-CM | POA: Diagnosis present

## 2015-05-08 DIAGNOSIS — I872 Venous insufficiency (chronic) (peripheral): Secondary | ICD-10-CM | POA: Diagnosis present

## 2015-05-08 DIAGNOSIS — Z87891 Personal history of nicotine dependence: Secondary | ICD-10-CM

## 2015-05-08 DIAGNOSIS — L97921 Non-pressure chronic ulcer of unspecified part of left lower leg limited to breakdown of skin: Secondary | ICD-10-CM | POA: Diagnosis not present

## 2015-05-08 DIAGNOSIS — M069 Rheumatoid arthritis, unspecified: Secondary | ICD-10-CM | POA: Diagnosis present

## 2015-05-08 DIAGNOSIS — I482 Chronic atrial fibrillation: Secondary | ICD-10-CM | POA: Diagnosis present

## 2015-05-08 DIAGNOSIS — B965 Pseudomonas (aeruginosa) (mallei) (pseudomallei) as the cause of diseases classified elsewhere: Secondary | ICD-10-CM | POA: Diagnosis present

## 2015-05-08 DIAGNOSIS — L97821 Non-pressure chronic ulcer of other part of left lower leg limited to breakdown of skin: Principal | ICD-10-CM | POA: Diagnosis present

## 2015-05-08 DIAGNOSIS — M1990 Unspecified osteoarthritis, unspecified site: Secondary | ICD-10-CM | POA: Diagnosis present

## 2015-05-08 DIAGNOSIS — L97811 Non-pressure chronic ulcer of other part of right lower leg limited to breakdown of skin: Secondary | ICD-10-CM | POA: Diagnosis present

## 2015-05-08 DIAGNOSIS — Z7901 Long term (current) use of anticoagulants: Secondary | ICD-10-CM

## 2015-05-08 DIAGNOSIS — D509 Iron deficiency anemia, unspecified: Secondary | ICD-10-CM | POA: Diagnosis present

## 2015-05-08 DIAGNOSIS — Z96653 Presence of artificial knee joint, bilateral: Secondary | ICD-10-CM | POA: Diagnosis present

## 2015-05-08 DIAGNOSIS — E785 Hyperlipidemia, unspecified: Secondary | ICD-10-CM | POA: Diagnosis present

## 2015-05-08 DIAGNOSIS — L03116 Cellulitis of left lower limb: Secondary | ICD-10-CM | POA: Diagnosis not present

## 2015-05-08 DIAGNOSIS — Z79899 Other long term (current) drug therapy: Secondary | ICD-10-CM | POA: Diagnosis not present

## 2015-05-08 DIAGNOSIS — Z8249 Family history of ischemic heart disease and other diseases of the circulatory system: Secondary | ICD-10-CM

## 2015-05-08 LAB — CBC WITH DIFFERENTIAL/PLATELET
BASOS PCT: 0 %
Band Neutrophils: 2 %
Basophils Absolute: 0 10*3/uL (ref 0–0.1)
Blasts: 0 %
EOS ABS: 0 10*3/uL (ref 0–0.7)
EOS PCT: 0 %
HEMATOCRIT: 26.3 % — AB (ref 35.0–47.0)
Hemoglobin: 8.4 g/dL — ABNORMAL LOW (ref 12.0–16.0)
LYMPHS ABS: 0.7 10*3/uL — AB (ref 1.0–3.6)
LYMPHS PCT: 4 %
MCH: 28.2 pg (ref 26.0–34.0)
MCHC: 31.9 g/dL — AB (ref 32.0–36.0)
MCV: 88.4 fL (ref 80.0–100.0)
Metamyelocytes Relative: 0 %
Monocytes Absolute: 0 10*3/uL — ABNORMAL LOW (ref 0.2–0.9)
Monocytes Relative: 0 %
Myelocytes: 0 %
NEUTROS ABS: 15.6 10*3/uL — AB (ref 1.4–6.5)
Neutrophils Relative %: 94 %
OTHER: 0 %
Platelets: 565 10*3/uL — ABNORMAL HIGH (ref 150–440)
Promyelocytes Absolute: 0 %
RBC: 2.97 MIL/uL — ABNORMAL LOW (ref 3.80–5.20)
RDW: 19 % — AB (ref 11.5–14.5)
WBC: 16.3 10*3/uL — AB (ref 3.6–11.0)
nRBC: 0 /100 WBC

## 2015-05-08 LAB — BASIC METABOLIC PANEL
Anion gap: 11 (ref 5–15)
BUN: 31 mg/dL — ABNORMAL HIGH (ref 6–20)
CALCIUM: 8.7 mg/dL — AB (ref 8.9–10.3)
CHLORIDE: 94 mmol/L — AB (ref 101–111)
CO2: 25 mmol/L (ref 22–32)
CREATININE: 1.31 mg/dL — AB (ref 0.44–1.00)
GFR calc non Af Amer: 36 mL/min — ABNORMAL LOW (ref >60)
GFR, EST AFRICAN AMERICAN: 41 mL/min — AB (ref >60)
Glucose, Bld: 105 mg/dL — ABNORMAL HIGH (ref 65–99)
Potassium: 4.6 mmol/L (ref 3.5–5.1)
SODIUM: 130 mmol/L — AB (ref 135–145)

## 2015-05-08 MED ORDER — ATENOLOL 25 MG PO TABS
25.0000 mg | ORAL_TABLET | Freq: Every day | ORAL | Status: DC
  Administered 2015-05-09 – 2015-05-12 (×4): 25 mg via ORAL
  Filled 2015-05-08 (×5): qty 1

## 2015-05-08 MED ORDER — BIOTIN 5000 MCG PO CAPS: 5000.0000 ug | ORAL_CAPSULE | Freq: Every day | ORAL | Status: DC

## 2015-05-08 MED ORDER — VANCOMYCIN HCL IN DEXTROSE 750-5 MG/150ML-% IV SOLN
750.0000 mg | INTRAVENOUS | Status: DC
  Administered 2015-05-09: 750 mg via INTRAVENOUS
  Filled 2015-05-08 (×2): qty 150

## 2015-05-08 MED ORDER — CALCIUM CARBONATE ANTACID 500 MG PO CHEW
1250.0000 mg | CHEWABLE_TABLET | Freq: Every day | ORAL | Status: DC
  Administered 2015-05-08 – 2015-05-11 (×4): 1250 mg via ORAL
  Filled 2015-05-08 (×5): qty 3

## 2015-05-08 MED ORDER — SODIUM CHLORIDE 0.9 % IJ SOLN: 3.0000 mL | INTRAMUSCULAR | Status: DC | PRN

## 2015-05-08 MED ORDER — OXYCODONE HCL ER 10 MG PO T12A
10.0000 mg | EXTENDED_RELEASE_TABLET | Freq: Two times a day (BID) | ORAL | Status: DC
  Administered 2015-05-08 – 2015-05-13 (×9): 10 mg via ORAL
  Filled 2015-05-08 (×10): qty 1

## 2015-05-08 MED ORDER — HYDROXYCHLOROQUINE SULFATE 200 MG PO TABS
400.0000 mg | ORAL_TABLET | Freq: Every day | ORAL | Status: DC
  Administered 2015-05-08 – 2015-05-12 (×4): 400 mg via ORAL
  Filled 2015-05-08 (×4): qty 2

## 2015-05-08 MED ORDER — ONDANSETRON HCL 4 MG/2ML IJ SOLN: 4.0000 mg | Freq: Four times a day (QID) | INTRAMUSCULAR | Status: DC | PRN

## 2015-05-08 MED ORDER — POLYETHYLENE GLYCOL 3350 17 G PO PACK
17.0000 g | PACK | Freq: Every day | ORAL | Status: DC | PRN
  Administered 2015-05-09: 17 g via ORAL
  Filled 2015-05-08: qty 1

## 2015-05-08 MED ORDER — SODIUM CHLORIDE 0.9 % IJ SOLN: 3.0000 mL | Freq: Two times a day (BID) | INTRAMUSCULAR | Status: DC

## 2015-05-08 MED ORDER — MAGNESIUM HYDROXIDE 400 MG/5ML PO SUSP
15.0000 mL | Freq: Every day | ORAL | Status: DC | PRN
  Administered 2015-05-08: 15 mL via ORAL
  Filled 2015-05-08: qty 30

## 2015-05-08 MED ORDER — VITAMIN B-12 1000 MCG PO TABS
2000.0000 ug | ORAL_TABLET | Freq: Every day | ORAL | Status: DC
  Administered 2015-05-09 – 2015-05-11 (×3): 2000 ug via ORAL
  Filled 2015-05-08 (×3): qty 2

## 2015-05-08 MED ORDER — ENOXAPARIN SODIUM 30 MG/0.3ML ~~LOC~~ SOLN
30.0000 mg | SUBCUTANEOUS | Status: DC
  Administered 2015-05-08: 30 mg via SUBCUTANEOUS
  Filled 2015-05-08: qty 0.3

## 2015-05-08 MED ORDER — OXYCODONE HCL 5 MG PO TABS
15.0000 mg | ORAL_TABLET | Freq: Four times a day (QID) | ORAL | Status: DC | PRN
  Administered 2015-05-08 – 2015-05-13 (×10): 15 mg via ORAL
  Filled 2015-05-08 (×10): qty 3

## 2015-05-08 MED ORDER — FERROUS SULFATE 325 (65 FE) MG PO TABS
325.0000 mg | ORAL_TABLET | Freq: Every day | ORAL | Status: DC
  Administered 2015-05-09 – 2015-05-11 (×3): 325 mg via ORAL
  Filled 2015-05-08 (×3): qty 1

## 2015-05-08 MED ORDER — GLUCOSAMINE 500 MG PO CAPS: 1500.0000 mg | ORAL_CAPSULE | Freq: Every day | ORAL | Status: DC

## 2015-05-08 MED ORDER — SODIUM CHLORIDE 0.9 % IV SOLN: 250.0000 mL | INTRAVENOUS | Status: DC | PRN

## 2015-05-08 MED ORDER — ACETAMINOPHEN 650 MG RE SUPP: 650.0000 mg | Freq: Four times a day (QID) | RECTAL | Status: DC | PRN

## 2015-05-08 MED ORDER — FUROSEMIDE 40 MG PO TABS
40.0000 mg | ORAL_TABLET | Freq: Every day | ORAL | Status: DC
  Administered 2015-05-09 – 2015-05-11 (×3): 40 mg via ORAL
  Filled 2015-05-08 (×4): qty 1

## 2015-05-08 MED ORDER — LEVOTHYROXINE SODIUM 75 MCG PO TABS
75.0000 ug | ORAL_TABLET | Freq: Every day | ORAL | Status: DC
  Administered 2015-05-10 – 2015-05-13 (×4): 75 ug via ORAL
  Filled 2015-05-08 (×5): qty 1

## 2015-05-08 MED ORDER — METHOTREXATE 2.5 MG PO TABS: 7.5000 mg | ORAL_TABLET | ORAL | Status: DC

## 2015-05-08 MED ORDER — DOCUSATE SODIUM 100 MG PO CAPS
100.0000 mg | ORAL_CAPSULE | Freq: Two times a day (BID) | ORAL | Status: DC
  Administered 2015-05-08 – 2015-05-13 (×9): 100 mg via ORAL
  Filled 2015-05-08 (×9): qty 1

## 2015-05-08 MED ORDER — ACETAMINOPHEN 325 MG PO TABS: 650.0000 mg | ORAL_TABLET | Freq: Four times a day (QID) | ORAL | Status: DC | PRN

## 2015-05-08 MED ORDER — CHONDROITIN SULFATE 150 MG PO CAPS: 1200.0000 mg | ORAL_CAPSULE | Freq: Every day | ORAL | Status: DC

## 2015-05-08 MED ORDER — METHOTREXATE 2.5 MG PO TABS: 5.0000 mg | ORAL_TABLET | ORAL | Status: DC

## 2015-05-08 MED ORDER — METHOTREXATE 2.5 MG PO TABS: 12.5000 mg | ORAL_TABLET | ORAL | Status: DC

## 2015-05-08 MED ORDER — VANCOMYCIN HCL IN DEXTROSE 750-5 MG/150ML-% IV SOLN
750.0000 mg | Freq: Once | INTRAVENOUS | Status: AC
  Administered 2015-05-08: 750 mg via INTRAVENOUS
  Filled 2015-05-08: qty 150

## 2015-05-08 MED ORDER — DEXTROSE 5 % IV SOLN
500.0000 mg | Freq: Once | INTRAVENOUS | Status: AC
  Administered 2015-05-08: 500 mg via INTRAVENOUS
  Filled 2015-05-08: qty 0.5

## 2015-05-08 MED ORDER — "ALGICELL CALCIUM DRESSING 4""X4 EX MISC": 1.0000 | Freq: Every day | CUTANEOUS | Status: DC

## 2015-05-08 MED ORDER — FOLIC ACID 1 MG PO TABS
2.0000 mg | ORAL_TABLET | Freq: Every day | ORAL | Status: DC
  Administered 2015-05-08 – 2015-05-11 (×4): 2 mg via ORAL
  Filled 2015-05-08 (×5): qty 2

## 2015-05-08 MED ORDER — DEXTROSE 5 % IV SOLN
500.0000 mg | Freq: Three times a day (TID) | INTRAVENOUS | Status: DC
  Administered 2015-05-09 – 2015-05-10 (×4): 500 mg via INTRAVENOUS
  Filled 2015-05-08 (×6): qty 0.5

## 2015-05-08 MED ORDER — ONDANSETRON HCL 4 MG PO TABS: 4.0000 mg | ORAL_TABLET | Freq: Four times a day (QID) | ORAL | Status: DC | PRN

## 2015-05-08 NOTE — ED Notes (Signed)
Pt has had wounds to BLE for 3-4 weeks and has been seen at wound clinic for 3 weeks.  They sent pt here today for r/o vasculitis and possible ID consult.  Ulcerative, necrotizing wounds to BLE per wound notes.

## 2015-05-08 NOTE — Progress Notes (Addendum)
ANTICOAGULATION CONSULT NOTE - Initial Consult  Pharmacy Consult for Lovenox  Indication: VTE prophylaxis  Allergies  Allergen Reactions  . Dabigatran Etexilate Mesylate Shortness Of Breath  . Coumadin [Warfarin Sodium] Other (See Comments)    hematoma  . Doxycycline Hyclate [Doxycycline] Itching  . Penicillins     Per pt anaphylactic reaction, years ago at age 81 (in college)  . Pradaxa [Dabigatran Etexilate Mesylate] Other (See Comments)    SLEEP  . Sulfonamide Derivatives   . Tape Other (See Comments)    blisters  . Tramadol Hcl     Itching     Patient Measurements: Height: 5' 5.5" (166.4 cm) Weight: 111 lb (50.349 kg) IBW/kg (Calculated) : 58.15 Heparin Dosing Weight:   Vital Signs: Temp: 98.4 F (36.9 C) (11/03 1757) Temp Source: Oral (11/03 1757) BP: 102/61 mmHg (11/03 1757) Pulse Rate: 86 (11/03 1757)  Labs:  Recent Labs  05/08/15 1326 05/08/15 1437  HGB  --  8.4*  HCT  --  26.3*  PLT  --  565*  CREATININE 1.31*  --     Estimated Creatinine Clearance: 24.5 mL/min (by C-G formula based on Cr of 1.31).   Medical History: Past Medical History  Diagnosis Date  . Arthritis   . Hyperlipidemia   . Hypothyroidism   . Atrial fibrillation ([place])     a. s/p prior DCCV's, now persistent-->Xarelto (CHA2DS2VASc = 3).  . Diverticulosis     a. by colonoscopy, h/o colon adenoma ([place])  . Gastritis and duodenitis     a. 01/2014 EGD: Moderate non-erosive gastritis and mild non-erosive duodenitis.    Medications:  Prescriptions prior to admission  Medication Sig Dispense Refill Last Dose  . acetaminophen (TYLENOL) 650 MG CR tablet Take 650 mg by mouth every 8 (eight) hours as needed for pain.   PRN  . acyclovir ointment (ZOVIRAX) 5 % Apply 1 application topically every 2 (two) hours as needed. (Patient taking differently: Apply 1 application topically every 2 (two) hours as needed (for infection). ) 30 g 2 PRN  . apixaban (ELIQUIS) 2.5 MG TABS tablet Take 1  tablet (2.5 mg total) by mouth 2 (two) times daily. 60 tablet 3 05/08/2015 at am  . atenolol (TENORMIN) 25 MG tablet TAKE ONE TABLET BY MOUTH ONCE DAILY 90 tablet 3 05/08/2015 at 0800  . Biotin 5000 MCG CAPS Take 5,000 mcg by mouth daily.    05/08/2015 at am  . Calcium Alginate (ALGICELL CALCIUM DRESSING 4"X4) MISC Apply 1 each topically daily. 10 each 2 Taking  . CALCIUM CARBONATE PO Take 1,200 mg by mouth daily.   05/08/2015 at am  . CHONDROITIN SULFATE PO Take 1,200 mg by mouth daily.   05/08/2015 at am  . cyanocobalamin 2000 MCG tablet Take 2,000 mcg by mouth daily.   05/08/2015 at am  . docusate sodium (COLACE) 100 MG capsule Take 100 mg by mouth 2 (two) times daily.   05/08/2015 at am  . Ferrous Sulfate (IRON) 325 (65 FE) MG TABS Take 325 mg by mouth daily.    11/04/9765 at am  . folic acid (FOLVITE) 1 MG tablet Take 2 mg by mouth daily.    05/08/2015 at am  . furosemide (LASIX) 40 MG tablet TAKE ONE TABLET BY MOUTH ONCE DAILY AS NEEDED (Patient taking differently: TAKE ONE TABLET BY MOUTH ONCE DAILY AS NEEDED FOR SWELLING) 90 tablet 3 PRN  . Glucosamine HCl (GLUCOSAMINE PO) Take 1,500 mg by mouth daily.   05/08/2015 at am  . hydroxychloroquine (  PLAQUENIL) 200 MG tablet Take 400 mg by mouth at bedtime.    05/07/2015 at pm  . levothyroxine (SYNTHROID, LEVOTHROID) 75 MCG tablet TAKE ONE TABLET BY MOUTH ONCE DAILY BEFORE BREAKFAST 90 tablet 3 05/08/2015 at am  . magnesium hydroxide (MILK OF MAGNESIA) 400 MG/5ML suspension Take 15 mLs by mouth daily as needed for mild constipation or moderate constipation.    PRN  . methotrexate (RHEUMATREX) 2.5 MG tablet Take 12.5 mg by mouth once a week. On Wednesday   05/07/2015 at am  . Multiple Vitamins-Minerals (ONE-A-DAY 50 PLUS PO) Take 1 tablet by mouth daily.    05/08/2015 at am  . omeprazole (PRILOSEC) 40 MG capsule Take 40 mg by mouth daily.   05/08/2015 at am  . OxyCODONE (OXYCONTIN) 10 mg T12A 12 hr tablet Take 10 mg by mouth every 12 (twelve) hours.    05/08/2015  at am  . oxyCODONE (ROXICODONE) 15 MG immediate release tablet Take 15 mg by mouth 4 (four) times daily as needed for pain.   PRN  . polyethylene glycol (MIRALAX / GLYCOLAX) packet Take 17 g by mouth daily as needed for mild constipation, moderate constipation or severe constipation.   PRN    Assessment: CrCl = 24.5 ml/min  Goal of Therapy:  DVT prophlyaxis   Plan:  Lovenox 40 mg SQ Q24H originally ordered.  Will adjust dose to lovenox 30 mg SQ Q24H based on CrCl < 30 ml/min.   [name] D 05/08/2015,6:18 PM

## 2015-05-08 NOTE — Progress Notes (Addendum)
EDUARDA, SCRIVENS ([id number]) Visit Report for 05/07/2015 Arrival Information Details Patient Name: [name], [name]. Date of Service: 05/07/2015 2:45 PM Medical Record Number: [id number] Patient Account Number: [id number] Date of Birth/Sex: 1927/04/12 (81 y.o. Female) Treating RN: Cornell Barman Primary Care Physician: Viviana Simpler Other Clinician: Referring Physician: Viviana Simpler Treating Physician/Extender: BURNS III, Charlean Sanfilippo in Treatment: 3 Visit Information History Since Last Visit Added or deleted any medications: No Patient Arrived: Ambulatory Any new allergies or adverse reactions: No Arrival Time: 15:42 Had a fall or experienced change in No Accompanied By: self activities of daily living that may affect Transfer Assistance: None risk of falls: Patient Identification Verified: Yes Signs or symptoms of abuse/neglect since last No Secondary Verification Process Yes visito Completed: Hospitalized since last visit: No Patient Requires Transmission- No Has Dressing in Place as Prescribed: Yes Based Precautions: Pain Present Now: Yes Patient Has Alerts: Yes Patient Alerts: Patient on Blood Thinner Electronic Signature(s) Signed: 05/07/2015 5:27:52 PM By: Gretta Cool, RN, BSN, Kim RN, BSN Entered By: Gretta Cool, RN, BSN, Kim on 05/07/2015 15:42:32 [place], Leward Quan ([id number]) -------------------------------------------------------------------------------- Clinic Level of Care Assessment Details Patient Name: [name], [name]. Date of Service: 05/07/2015 2:45 PM Medical Record Number: [id number] Patient Account Number: [id number] Date of Birth/Sex: 1927-05-07 (81 y.o. Female) Treating RN: Cornell Barman Primary Care Physician: Viviana Simpler Other Clinician: Referring Physician: Viviana Simpler Treating Physician/Extender: BURNS III, Charlean Sanfilippo in Treatment: 3 Clinic Level of Care Assessment Items TOOL 4 Quantity Score []  - Use when only an EandM is performed on FOLLOW-UP  visit 0 ASSESSMENTS - Nursing Assessment / Reassessment []  - Reassessment of Co-morbidities (includes updates in patient status) 0 X - Reassessment of Adherence to Treatment Plan 1 5 ASSESSMENTS - Wound and Skin Assessment / Reassessment []  - Simple Wound Assessment / Reassessment - one wound 0 X - Complex Wound Assessment / Reassessment - multiple wounds 2 5 []  - Dermatologic / Skin Assessment (not related to wound area) 0 ASSESSMENTS - Focused Assessment []  - Circumferential Edema Measurements - multi extremities 0 []  - Nutritional Assessment / Counseling / Intervention 0 []  - Lower Extremity Assessment (monofilament, tuning fork, pulses) 0 []  - Peripheral Arterial Disease Assessment (using hand held doppler) 0 ASSESSMENTS - Ostomy and/or Continence Assessment and Care []  - Incontinence Assessment and Management 0 []  - Ostomy Care Assessment and Management (repouching, etc.) 0 PROCESS - Coordination of Care []  - Simple Patient / Family Education for ongoing care 0 X - Complex (extensive) Patient / Family Education for ongoing care 1 20 X - Staff obtains Programmer, systems, Records, Test Results / Process Orders 1 10 []  - Staff telephones HHA, Nursing Homes / Clarify orders / etc 0 []  - Routine Transfer to another Facility (non-emergent condition) 0 SHINIQUA, GROSECLOSE ([id number]) []  - Routine Hospital Admission (non-emergent condition) 0 []  - New Admissions / Biomedical engineer / Ordering NPWT, Apligraf, etc. 0 []  - Emergency Hospital Admission (emergent condition) 0 X - Simple Discharge Coordination 1 10 []  - Complex (extensive) Discharge Coordination 0 PROCESS - Special Needs []  - Pediatric / Minor Patient Management 0 []  - Isolation Patient Management 0 []  - Hearing / Language / Visual special needs 0 []  - Assessment of Community assistance (transportation, D/C planning, etc.) 0 []  - Additional assistance / Altered mentation 0 []  - Support Surface(s) Assessment (bed, cushion, seat,  etc.) 0 INTERVENTIONS - Wound Cleansing / Measurement []  - Simple Wound Cleansing - one wound 0 X - Complex Wound Cleansing - multiple wounds 1 5  X - Wound Imaging (photographs - any number of wounds) 1 5 []  - Wound Tracing (instead of photographs) 0 []  - Simple Wound Measurement - one wound 0 X - Complex Wound Measurement - multiple wounds 1 5 INTERVENTIONS - Wound Dressings []  - Small Wound Dressing one or multiple wounds 0 X - Medium Wound Dressing one or multiple wounds 1 15 []  - Large Wound Dressing one or multiple wounds 0 []  - Application of Medications - topical 0 []  - Application of Medications - injection 0 INTERVENTIONS - Miscellaneous []  - External ear exam 0 BROCK, MOKRY. ([id number]) []  - Specimen Collection (cultures, biopsies, blood, body fluids, etc.) 0 []  - Specimen(s) / Culture(s) sent or taken to Lab for analysis 0 []  - Patient Transfer (multiple staff / Harrel Lemon Lift / Similar devices) 0 []  - Simple Staple / Suture removal (25 or less) 0 []  - Complex Staple / Suture removal (26 or more) 0 []  - Hypo / Hyperglycemic Management (close monitor of Blood Glucose) 0 []  - Ankle / Brachial Index (ABI) - do not check if billed separately 0 X - Vital Signs 1 5 Has the patient been seen at the hospital within the last three years: Yes Total Score: 90 Level Of Care: New/Established - Level 3 Electronic Signature(s) Signed: 05/07/2015 5:27:52 PM By: Gretta Cool, RN, BSN, Kim RN, BSN Entered By: Gretta Cool, RN, BSN, Kim on 05/07/2015 16:25:00 Grussing, Leward Quan ([id number]) -------------------------------------------------------------------------------- Encounter Discharge Information Details Patient Name: [name]. Date of Service: 05/07/2015 2:45 PM Medical Record Number: [id number] Patient Account Number: [id number] Date of Birth/Sex: 1927/01/14 (81 y.o. Female) Treating RN: Cornell Barman Primary Care Physician: Viviana Simpler Other Clinician: Referring Physician: Viviana Simpler Treating Physician/Extender: BURNS III, Charlean Sanfilippo in Treatment: 3 Encounter Discharge Information Items Discharge Pain Level: 0 Discharge Condition: Stable Ambulatory Status: Ambulatory Discharge Destination: Home Transportation: Private Auto Accompanied By: self Schedule Follow-up Appointment: Yes Medication Reconciliation completed and provided to Patient/Care Yes Ryker Pherigo: Provided on Clinical Summary of Care: 05/07/2015 Form Type Recipient Paper Patient JD Electronic Signature(s) Signed: 05/07/2015 4:25:59 PM By: Ruthine Dose Entered By: Ruthine Dose on 05/07/2015 16:25:59 Diedrich, Leward Quan ([id number]) -------------------------------------------------------------------------------- Lower Extremity Assessment Details Patient Name: [name]. Date of Service: 05/07/2015 2:45 PM Medical Record Number: [id number] Patient Account Number: [id number] Date of Birth/Sex: 25-Aug-1926 (81 y.o. Female) Treating RN: Cornell Barman Primary Care Physician: Viviana Simpler Other Clinician: Referring Physician: Viviana Simpler Treating Physician/Extender: BURNS III, Charlean Sanfilippo in Treatment: 3 Edema Assessment Assessed: [Left: No] [Right: No] E[Left: dema] [Right: :] Calf Left: Right: Point of Measurement: 35 cm From Medial Instep 350 cm 36.5 cm Ankle Left: Right: Point of Measurement: 10 cm From Medial Instep 21.3 cm 22.2 cm Vascular Assessment Pulses: Posterior Tibial Dorsalis Pedis Palpable: [Left:Yes] [Right:Yes] Extremity colors, hair growth, and conditions: Extremity Color: [Left:Hyperpigmented] [Right:Hyperpigmented] Hair Growth on Extremity: [Left:No] [Right:No] Temperature of Extremity: [Left:Warm] [Right:Warm] Capillary Refill: [Left:> 3 seconds] [Right:> 3 seconds] Toe Nail Assessment Left: Right: Thick: No No Discolored: No No Deformed: No No Improper Length and Hygiene: No No Electronic Signature(s) Signed: 05/07/2015 5:27:52 PM By: Gretta Cool, RN,  BSN, Kim RN, BSN Entered By: Gretta Cool, RN, BSN, Kim on 05/07/2015 15:46:38 Devore, Leward Quan ([id number]) -------------------------------------------------------------------------------- Multi Wound Chart Details Patient Name: [name]. Date of Service: 05/07/2015 2:45 PM Medical Record Number: [id number] Patient Account Number: [id number] Date of Birth/Sex: Jul 24, 1926 (81 y.o. Female) Treating RN: Cornell Barman Primary Care Physician: Viviana Simpler Other Clinician: Referring Physician: Viviana Simpler Treating Physician/Extender:  BURNS III, WALTER Weeks in Treatment: 3 Vital Signs Height(in): 62 Pulse(bpm): 93 Weight(lbs): 115 Blood Pressure 103/54 (mmHg): Body Mass Index(BMI): 21 Temperature(F): 98.4 Respiratory Rate 20 (breaths/min): Photos: [6:No Photos] [7:No Photos] [N/A:N/A] Wound Location: [6:Left, Circumferential Lower Leg] [7:Right Lower Leg - Medial N/A] Wounding Event: [6:Gradually Appeared] [7:Gradually Appeared] [N/A:N/A] Primary Etiology: [6:Venous Leg Ulcer] [7:Venous Leg Ulcer] [N/A:N/A] Comorbid History: [6:N/A] [7:Cataracts, Anemia, Arrhythmia, Rheumatoid Arthritis, Osteoarthritis, Received Chemotherapy] [N/A:N/A] Date Acquired: [6:04/07/2015] [7:04/07/2015] [N/A:N/A] Weeks of Treatment: [6:3] [7:3] [N/A:N/A] Wound Status: [6:Open] [7:Open] [N/A:N/A] Measurements L x W x D 13x20x0.1 [7:16.5x16x0.2] [N/A:N/A] (cm) Area (cm) : [3:710.626] [7:207.345] [N/A:N/A] Volume (cm) : [6:20.42] [7:41.469] [N/A:N/A] % Reduction in Area: [6:-622.20%] [7:-135.60%] [N/A:N/A] % Reduction in Volume: -622.30% [7:-371.20%] [N/A:N/A] Classification: [6:Partial Thickness] [7:Partial Thickness] [N/A:N/A] Exudate Amount: [6:N/A] [7:Medium] [N/A:N/A] Exudate Type: [6:N/A] [7:Serosanguineous] [N/A:N/A] Exudate Color: [6:N/A] [7:red, brown] [N/A:N/A] Wound Margin: [6:N/A] [7:Indistinct, nonvisible] [N/A:N/A] Granulation Amount: [6:N/A] [7:Small (1-33%)] [N/A:N/A] Granulation  Quality: [6:N/A] [7:Red] [N/A:N/A] Necrotic Amount: [6:N/A] [7:Large (67-100%)] [N/A:N/A] Necrotic Tissue: [6:N/A] [7:Eschar] [N/A:N/A] Epithelialization: [6:N/A] [7:None] [N/A:N/A] Periwound Skin Texture: No Abnormalities Noted [7:Edema: Yes Excoriation: No] [N/A:N/A] Induration: No Callus: No Crepitus: No Fluctuance: No Friable: No Rash: No Scarring: No Periwound Skin No Abnormalities Noted Moist: Yes N/A Moisture: Maceration: No Dry/Scaly: No Periwound Skin Color: No Abnormalities Noted Mottled: Yes N/A Pallor: Yes Atrophie Blanche: No Cyanosis: No Ecchymosis: No Erythema: No Hemosiderin Staining: No Rubor: No Temperature: N/A No Abnormality N/A Tenderness on No Yes N/A Palpation: Wound Preparation: N/A Ulcer Cleansing: N/A Rinsed/Irrigated with Saline Topical Anesthetic Applied: None Treatment Notes Electronic Signature(s) Signed: 05/07/2015 5:27:52 PM By: Gretta Cool, RN, BSN, Kim RN, BSN Entered By: Gretta Cool, RN, BSN, Kim on 05/07/2015 15:53:51 [place], Leward Quan ([id number]) -------------------------------------------------------------------------------- [place] Details Patient Name: [name], [name]. Date of Service: 05/07/2015 2:45 PM Medical Record Number: [id number] Patient Account Number: [id number] Date of Birth/Sex: Dec 18, 1926 (81 y.o. Female) Treating RN: Cornell Barman Primary Care Physician: Viviana Simpler Other Clinician: Referring Physician: Viviana Simpler Treating Physician/Extender: BURNS III, Charlean Sanfilippo in Treatment: 3 Active Inactive Electronic Signature(s) Signed: 06/02/2015 5:17:02 PM By: Gretta Cool RN, BSN, Kim RN, BSN Previous Signature: 05/07/2015 5:27:52 PM Version By: Gretta Cool RN, BSN, Kim RN, BSN Entered By: Gretta Cool, RN, BSN, Kim on 05/15/2015 09:53:10 [place], Leward Quan ([id number]) -------------------------------------------------------------------------------- Pain Assessment Details Patient Name: [name], [name]. Date of  Service: 05/07/2015 2:45 PM Medical Record Number: [id number] Patient Account Number: [id number] Date of Birth/Sex: Jul 26, 1926 (81 y.o. Female) Treating RN: Cornell Barman Primary Care Physician: Viviana Simpler Other Clinician: Referring Physician: Viviana Simpler Treating Physician/Extender: BURNS III, Charlean Sanfilippo in Treatment: 3 Active Problems Location of Pain Severity and Description of Pain Patient Has Paino Yes Site Locations Pain Location: Pain in Ulcers With Dressing Change: Yes Duration of the Pain. Constant / Intermittento Constant Rate the pain. Current Pain Level: 8 Worst Pain Level: 10 Character of Pain Describe the Pain: Shooting, Tender, Throbbing Pain Management and Medication Current Pain Management: Electronic Signature(s) Signed: 05/07/2015 5:27:52 PM By: Gretta Cool, RN, BSN, Kim RN, BSN Entered By: Gretta Cool, RN, BSN, Kim on 05/07/2015 15:42:58 [place], Leward Quan ([id number]) -------------------------------------------------------------------------------- Patient/Caregiver Education Details Patient Name: [name] Date of Service: 05/07/2015 2:45 PM Medical Record Number: [id number] Patient Account Number: [id number] Date of Birth/Gender: Jul 09, 1926 (81 y.o. Female) Treating RN: Cornell Barman Primary Care Physician: Viviana Simpler Other Clinician: Referring Physician: Viviana Simpler Treating Physician/Extender: BURNS III, Charlean Sanfilippo in Treatment: 3 Education Assessment Education Provided To: Patient Education Topics Provided Wound/Skin Impairment: Handouts:  Caring for Your Ulcer, Other: continue wound care as prescribed Methods: Demonstration Responses: State content correctly Electronic Signature(s) Signed: 05/07/2015 5:27:52 PM By: Gretta Cool, RN, BSN, Kim RN, BSN Entered By: Gretta Cool, RN, BSN, Kim on 05/07/2015 16:11:57 [place], Leward Quan ([id number]) -------------------------------------------------------------------------------- Wound Assessment  Details Patient Name: KENISHA, LYNDS. Date of Service: 05/07/2015 2:45 PM Medical Record Number: [id number] Patient Account Number: [id number] Date of Birth/Sex: 12-Mar-1927 (81 y.o. Female) Treating RN: Cornell Barman Primary Care Physician: Viviana Simpler Other Clinician: Referring Physician: Viviana Simpler Treating Physician/Extender: BURNS III, Charlean Sanfilippo in Treatment: 3 Wound Status Wound Number: 6 Primary Etiology: Venous Leg Ulcer Wound Location: Left, Circumferential Lower Leg Wound Status: Open Wounding Event: Gradually Appeared Date Acquired: 04/07/2015 Weeks Of Treatment: 3 Clustered Wound: No Photos Photo Uploaded By: Gretta Cool, RN, BSN, Kim on 05/07/2015 17:19:03 Wound Measurements Length: (cm) 13 Width: (cm) 20 Depth: (cm) 0.1 Area: (cm) 204.204 Volume: (cm) 20.42 % Reduction in Area: -622.2% % Reduction in Volume: -622.3% Wound Description Classification: Partial Thickness Periwound Skin Texture Texture Color No Abnormalities Noted: No No Abnormalities Noted: No Moisture No Abnormalities Noted: No Electronic Signature(s) Signed: 05/07/2015 5:27:52 PM By: Gretta Cool, RN, BSN, Kim RN, BSN [place], [place] ([id number]) Entered By: Gretta Cool, RN, BSN, Kim on 05/07/2015 15:48:26 [place], Leward Quan ([id number]) -------------------------------------------------------------------------------- Wound Assessment Details Patient Name: [name], [name]. Date of Service: 05/07/2015 2:45 PM Medical Record Number: [id number] Patient Account Number: [id number] Date of Birth/Sex: 1926-09-06 (81 y.o. Female) Treating RN: Cornell Barman Primary Care Physician: Viviana Simpler Other Clinician: Referring Physician: Viviana Simpler Treating Physician/Extender: BURNS III, Charlean Sanfilippo in Treatment: 3 Wound Status Wound Number: 7 Primary Venous Leg Ulcer Etiology: Wound Location: Right Lower Leg - Medial Wound Open Wounding Event: Gradually Appeared Status: Date Acquired:  04/07/2015 Comorbid Cataracts, Anemia, Arrhythmia, Weeks Of Treatment: 3 History: Rheumatoid Arthritis, Osteoarthritis, Clustered Wound: No Received Chemotherapy Wound Measurements Length: (cm) 16.5 Width: (cm) 16 Depth: (cm) 0.2 Area: (cm) 207.345 Volume: (cm) 41.469 % Reduction in Area: -135.6% % Reduction in Volume: -371.2% Epithelialization: None Wound Description Classification: Partial Thickness Foul Odor Af Wound Margin: Indistinct, nonvisible Exudate Amount: Medium Exudate Type: Serosanguineous Exudate Color: red, brown ter Cleansing: No Wound Bed Granulation Amount: Small (1-33%) Exposed Structure Granulation Quality: Red Fascia Exposed: No Necrotic Amount: Large (67-100%) Fat Layer Exposed: No Necrotic Quality: Eschar Tendon Exposed: No Muscle Exposed: No Joint Exposed: No Bone Exposed: No Limited to Skin Breakdown Periwound Skin Texture Texture Color No Abnormalities Noted: No No Abnormalities Noted: No Callus: No Atrophie Blanche: No Crepitus: No Cyanosis: No Excoriation: No Ecchymosis: No [name], [name] ([id number]) Fluctuance: No Erythema: No Friable: No Hemosiderin Staining: No Induration: No Mottled: Yes Localized Edema: Yes Pallor: Yes Rash: No Rubor: No Scarring: No Temperature / Pain Moisture Temperature: No Abnormality No Abnormalities Noted: No Tenderness on Palpation: Yes Dry / Scaly: No Maceration: No Moist: Yes Wound Preparation Ulcer Cleansing: Rinsed/Irrigated with Saline Topical Anesthetic Applied: None Electronic Signature(s) Signed: 05/07/2015 5:27:52 PM By: Gretta Cool, RN, BSN, Kim RN, BSN Entered By: Gretta Cool, RN, BSN, Kim on 05/07/2015 15:49:02 [name], Leward Quan ([id number]) -------------------------------------------------------------------------------- [place] Details Patient Name: [name] Date of Service: 05/07/2015 2:45 PM Medical Record Number: [id number] Patient Account Number: [id number] Date of  Birth/Sex: 09/27/1926 (81 y.o. Female) Treating RN: Cornell Barman Primary Care Physician: Viviana Simpler Other Clinician: Referring Physician: Viviana Simpler Treating Physician/Extender: BURNS III, Charlean Sanfilippo in Treatment: 3 Vital Signs Time Taken: 15:43 Temperature (F): 98.4 Height (in): 62 Pulse (bpm): 93 Weight (lbs): 115  Respiratory Rate (breaths/min): 20 Body Mass Index (BMI): 21 Blood Pressure (mmHg): 103/54 Reference Range: 80 - 120 mg / dl Electronic Signature(s) Signed: 05/07/2015 5:27:52 PM By: Gretta Cool, RN, BSN, Kim RN, BSN Entered By: Gretta Cool, RN, BSN, Kim on 05/07/2015 15:43:16

## 2015-05-08 NOTE — ED Notes (Signed)
Admitting MD at bedside.

## 2015-05-08 NOTE — ED Provider Notes (Signed)
[facility] Emergency Department Provider Note  ____________________________________________  Time seen: 1320  I have reviewed the triage vital signs and the nursing notes.   HISTORY  Chief Complaint Wound Infection   History limited by: Not Limited   HPI [name] is a 81 y.o. female who presents to the emergency department today because of concerns for leg ulcers. The patient is being followed at the wound care clinic. She states that the ulcers started roughly 3 weeks ago. It sounds like initially were hematomas. Since then however she has had open wounds. She has been seen by the wound care clinic and had undergone multiple rounds of oral antibiotics. She denies any recent fevers.   Past Medical History  Diagnosis Date  . Arthritis   . Hyperlipidemia   . Hypothyroidism   . Atrial fibrillation ([place])     a. s/p prior DCCV's, now persistent-->Xarelto (CHA2DS2VASc = 3).  . Diverticulosis     a. by colonoscopy, h/o colon adenoma ([place])  . Gastritis and duodenitis     a. 01/2014 EGD: Moderate non-erosive gastritis and mild non-erosive duodenitis.    Patient Active Problem List   Diagnosis Date Noted  . Venous stasis ulcer of right lower extremity ([place]) 03/31/2015  . Abrasion of head 12/04/2014  . Gastric AVM 11/25/2014  . Mild malnutrition ([place]) 10/11/2014  . COPD (chronic obstructive pulmonary disease) with emphysema ([place]) 10/11/2014  . Chronic kidney disease, stage III (moderate) 10/11/2014  . Rheumatoid arthritis ([place]) 07/29/2014  . Stress due to illness of family member 01/11/2014  . Iron deficiency anemia due to chronic blood loss 01/11/2014  . Gastritis and duodenitis 01/11/2014  . Chronic venous insufficiency 02/02/2013  . Routine general medical examination at a health care facility 05/15/2012  . Other and unspecified hyperlipidemia 09/30/2010  . Osteoarthrosis involving more than one site 09/30/2010  . UNSPECIFIED HYPOTHYROIDISM  04/28/2009  . Atrial fibrillation ([place]) 04/28/2009    Past Surgical History  Procedure Laterality Date  . Rotator cuff repair    . Intraocular lens implant, secondary  1996/ 2010  . Knee surgery      lateral  . Tonsillectomy    . Colonoscopy  02/2011    mod diverticulosis ([place])  . Total knee arthroplasty Bilateral     Current Outpatient Rx  Name  Route  Sig  Dispense  Refill  . acetaminophen (TYLENOL) 650 MG CR tablet   Oral   Take 650 mg by mouth every 8 (eight) hours as needed for pain.         Marland Kitchen acyclovir ointment (ZOVIRAX) 5 %   Topical   Apply 1 application topically every 2 (two) hours as needed.   30 g   2   . apixaban (ELIQUIS) 2.5 MG TABS tablet   Oral   Take 1 tablet (2.5 mg total) by mouth 2 (two) times daily.   60 tablet   3     Pt would like loose tablets.   Marland Kitchen atenolol (TENORMIN) 25 MG tablet      TAKE ONE TABLET BY MOUTH ONCE DAILY   90 tablet   3   . Biotin 5000 MCG CAPS   Oral   Take by mouth daily.           . Calcium Alginate (ALGICELL CALCIUM DRESSING 4"X4) MISC   Apply externally   Apply 1 each topically daily.   10 each   2   . CALCIUM CARBONATE PO   Oral  Take 1,200 mg by mouth daily.         . CHONDROITIN SULFATE PO   Oral   Take 1,200 mg by mouth daily.         . cyanocobalamin 2000 MCG tablet   Oral   Take 2,000 mcg by mouth daily.         [name] Sodium (COLACE PO)   Oral   Take by mouth as needed.         . Ferrous Sulfate (IRON) 325 (65 FE) MG TABS   Oral   Take by mouth daily.         . folic acid (FOLVITE) 1 MG tablet   Oral   Take 2 mg by mouth daily.          . furosemide (LASIX) 40 MG tablet      TAKE ONE TABLET BY MOUTH ONCE DAILY AS NEEDED   90 tablet   3   . Glucosamine HCl (GLUCOSAMINE PO)   Oral   Take 1,500 mg by mouth daily.         . hydroxychloroquine (PLAQUENIL) 200 MG tablet   Oral   Take 400 mg by mouth daily.          Marland Kitchen levothyroxine (SYNTHROID,  LEVOTHROID) 75 MCG tablet      TAKE ONE TABLET BY MOUTH ONCE DAILY BEFORE BREAKFAST   90 tablet   3   . magnesium hydroxide (MILK OF MAGNESIA) 400 MG/5ML suspension   Oral   Take by mouth daily as needed for mild constipation.         . methotrexate (RHEUMATREX) 2.5 MG tablet      Take 5 tablets on Wednesday.         . Multiple Vitamins-Minerals (ONE-A-DAY 50 PLUS PO)   Oral   Take by mouth daily.           Marland Kitchen omeprazole (PRILOSEC) 40 MG capsule   Oral   Take 40 mg by mouth daily.         . OxyCODONE (OXYCONTIN) 10 mg T12A 12 hr tablet   Oral   Take 10 mg by mouth every 12 (twelve) hours.          . Polyethylene Glycol 3350 (MIRALAX PO)   Oral   Take by mouth as needed.           Allergies Dabigatran etexilate mesylate; Coumadin; Doxycycline hyclate; Penicillins; Pradaxa; Sulfonamide derivatives; Tape; and Tramadol hcl  Family History  Problem Relation Age of Onset  . Heart disease Mother     Social History Social History  Substance Use Topics  . Smoking status: Former Smoker    Types: Cigarettes  . Smokeless tobacco: Never Used  . Alcohol Use: Yes    Review of Systems  Constitutional: Negative for fever. Cardiovascular: Negative for chest pain. Respiratory: Negative for shortness of breath. Gastrointestinal: Negative for abdominal pain, vomiting and diarrhea. Genitourinary: Negative for dysuria. Musculoskeletal: Negative for back pain. Skin: Bilateral lower extremity ulceration. Neurological: Negative for headaches, focal weakness or numbness.   10-point ROS otherwise negative.  ____________________________________________   PHYSICAL EXAM:  VITAL SIGNS: ED Triage Vitals  Enc Vitals Group     BP 05/08/15 1257 100/48 mmHg     Pulse Rate 05/08/15 1257 78     Resp 05/08/15 1257 20     Temp 05/08/15 1257 98.6 F (37 C)     Temp Source 05/08/15 1257 Oral     SpO2 05/08/15  1257 97 %     Weight 05/08/15 1257 111 lb (50.349 kg)      Height 05/08/15 1257 5' 5.5" (1.664 m)     Head Cir --      Peak Flow --      Pain Score 05/08/15 1300 8   Constitutional: Alert and oriented. Well appearing and in no distress. Eyes: Conjunctivae are normal. PERRL. Normal extraocular movements. ENT   Head: Normocephalic and atraumatic.   Nose: No congestion/rhinnorhea.   Mouth/Throat: Mucous membranes are moist.   Neck: No stridor. Hematological/Lymphatic/Immunilogical: No cervical lymphadenopathy. Cardiovascular: Normal rate, regular rhythm.  No murmurs, rubs, or gallops. Respiratory: Normal respiratory effort without tachypnea nor retractions. Breath sounds are clear and equal bilaterally. No wheezes/rales/rhonchi. Gastrointestinal: Soft and nontender. No distention.  Genitourinary: Deferred Musculoskeletal: Normal range of motion in all extremities. No joint effusions.  No lower extremity tenderness nor edema. Neurologic:  Normal speech and language. No gross focal neurologic deficits are appreciated.  Skin:  Skin is warm, dry and intact. No rash noted. Psychiatric: Mood and affect are normal. Speech and behavior are normal. Patient exhibits appropriate insight and judgment.  ____________________________________________    LABS (pertinent positives/negatives)  Labs Reviewed  BASIC METABOLIC PANEL - Abnormal; Notable for the following:    Sodium 130 (*)    Chloride 94 (*)    Glucose, Bld 105 (*)    BUN 31 (*)    Creatinine, Ser 1.31 (*)    Calcium 8.7 (*)    GFR calc non Af Amer 36 (*)    GFR calc Af Amer 41 (*)    All other components within normal limits  CBC WITH DIFFERENTIAL/PLATELET - Abnormal; Notable for the following:    WBC 16.3 (*)    RBC 2.97 (*)    Hemoglobin 8.4 (*)    HCT 26.3 (*)    MCHC 31.9 (*)    RDW 19.0 (*)    Platelets 565 (*)    Neutro Abs 15.6 (*)    Lymphs Abs 0.7 (*)    Monocytes Absolute 0.0 (*)    All other components within normal limits  CULTURE, BLOOD (ROUTINE X 2)   CULTURE, BLOOD (ROUTINE X 2)  WOUND CULTURE  CBC WITH DIFFERENTIAL/PLATELET     ____________________________________________   EKG  None  ____________________________________________    RADIOLOGY  None     ____________________________________________   PROCEDURES  Procedure(s) performed: None  Critical Care performed: No  ____________________________________________   INITIAL IMPRESSION / ASSESSMENT AND PLAN / ED COURSE  Pertinent labs & imaging results that were available during my care of the patient were reviewed by me and considered in my medical decision making (see chart for details).  Patient presents with bilateral lower leg ulcers. Patient with leukocytosis. Given appearance will treat with antibiotics. Will plan on admission to the hospital for further antibiotics and work up of etiology of ulcerations.  ____________________________________________   FINAL CLINICAL IMPRESSION(S) / ED DIAGNOSES  Final diagnoses:  Ulcers of both lower extremities, limited to breakdown of skin ([place])     Nance Pear, MD 05/09/15 [phone]

## 2015-05-08 NOTE — H&P (Signed)
[place] [facility] [place] NAME: [name]    MR#:  [id number]  DATE OF BIRTH:  12/18/1926  DATE OF ADMISSION:  05/08/2015  PRIMARY CARE PHYSICIAN: Viviana Simpler, MD   REQUESTING/REFERRING PHYSICIAN: Dr. Archie Balboa  CHIEF COMPLAINT:   Bilateral lower extremity ulcers nonhealing for 3 weeks HISTORY OF PRESENT ILLNESS:  [name]  is a 81 y.o. female with a known history of chronic A. fib on eliquis, chronic venous insufficiency, rheumatoid arthritis on methotrexate and Plaquenil, hypothyroidism, history of GI bleed secondary to AVMs comes to the emergency room with bilateral lower extremity necrotic-looking ulcers. Patient has purplish hue over both her lower extremities significant for the venous insufficiency she developed 2 ulcers on both the lower extremities about 3 weeks ago was followed up at one clinic however it progressed to the point now they are black/necrotic-looking ulcers. She was seen by Dr. Jefm Bryant sent to the emergency room for further evaluation management. The patient is complaining of pain in both lower extremities. She denies any fever or nausea or vomiting chest pain or abdominal pain. She received a dose of IV vancomycin PAST MEDICAL HISTORY:   Past Medical History  Diagnosis Date  . Arthritis   . Hyperlipidemia   . Hypothyroidism   . Atrial fibrillation ([place])     a. s/p prior DCCV's, now persistent-->Xarelto (CHA2DS2VASc = 3).  . Diverticulosis     a. by colonoscopy, h/o colon adenoma ([place])  . Gastritis and duodenitis     a. 01/2014 EGD: Moderate non-erosive gastritis and mild non-erosive duodenitis.    PAST SURGICAL HISTOIRY:   Past Surgical History  Procedure Laterality Date  . Rotator cuff repair    . Intraocular lens implant, secondary  1996/ 2010  . Knee surgery      lateral  . Tonsillectomy    . Colonoscopy  02/2011    mod diverticulosis ([place])  . Total knee arthroplasty Bilateral      SOCIAL HISTORY:   Social History  Substance Use Topics  . Smoking status: Former Smoker    Types: Cigarettes  . Smokeless tobacco: Never Used  . Alcohol Use: Yes    FAMILY HISTORY:   Family History  Problem Relation Age of Onset  . Heart disease Mother     DRUG ALLERGIES:   Allergies  Allergen Reactions  . Dabigatran Etexilate Mesylate Shortness Of Breath  . Coumadin [Warfarin Sodium] Other (See Comments)    hematoma  . Doxycycline Hyclate [Doxycycline] Itching  . Penicillins   . Pradaxa [Dabigatran Etexilate Mesylate] Other (See Comments)    SLEEP  . Sulfonamide Derivatives   . Tape Other (See Comments)    blisters  . Tramadol Hcl     Itching     REVIEW OF SYSTEMS:  Review of Systems  Constitutional: Negative for fever, chills and weight loss.  HENT: Negative for ear discharge, ear pain and nosebleeds.   Eyes: Negative for blurred vision, pain and discharge.  Respiratory: Negative for sputum production, shortness of breath, wheezing and stridor.   Cardiovascular: Negative for chest pain, palpitations, orthopnea and PND.  Gastrointestinal: Negative for nausea, vomiting, abdominal pain and diarrhea.  Genitourinary: Negative for urgency and frequency.  Musculoskeletal: Negative for back pain and joint pain.  Skin:       Bilateral lower extremity necrotic-looking ulcers  Neurological: Negative for sensory change, speech change, focal weakness and weakness.  Psychiatric/Behavioral: Negative for depression and hallucinations. The patient is not nervous/anxious.  All other systems reviewed and are negative.    MEDICATIONS AT HOME:   Prior to Admission medications   Medication Sig Start Date End Date Taking? Authorizing Provider  acetaminophen (TYLENOL) 650 MG CR tablet Take 650 mg by mouth every 8 (eight) hours as needed for pain.   Yes Historical Provider, MD  acyclovir ointment (ZOVIRAX) 5 % Apply 1 application topically every 2 (two) hours as  needed. Patient taking differently: Apply 1 application topically every 2 (two) hours as needed (for infection).  04/28/15  Yes Venia Carbon, MD  apixaban (ELIQUIS) 2.5 MG TABS tablet Take 1 tablet (2.5 mg total) by mouth 2 (two) times daily. 01/20/15  Yes Minna Merritts, MD  atenolol (TENORMIN) 25 MG tablet TAKE ONE TABLET BY MOUTH ONCE DAILY 09/23/14  Yes Venia Carbon, MD  Biotin 5000 MCG CAPS Take 5,000 mcg by mouth daily.    Yes Venia Carbon, MD  Calcium Alginate (ALGICELL CALCIUM DRESSING 4"X4) MISC Apply 1 each topically daily. 04/07/15  Yes Venia Carbon, MD  CALCIUM CARBONATE PO Take 1,200 mg by mouth daily.   Yes Historical Provider, MD  CHONDROITIN SULFATE PO Take 1,200 mg by mouth daily.   Yes Historical Provider, MD  cyanocobalamin 2000 MCG tablet Take 2,000 mcg by mouth daily.   Yes Historical Provider, MD  docusate sodium (COLACE) 100 MG capsule Take 100 mg by mouth 2 (two) times daily.   Yes Historical Provider, MD  Ferrous Sulfate (IRON) 325 (65 FE) MG TABS Take 325 mg by mouth daily.    Yes Historical Provider, MD  folic acid (FOLVITE) 1 MG tablet Take 2 mg by mouth daily.  11/12/14 11/12/15 Yes Historical Provider, MD  furosemide (LASIX) 40 MG tablet TAKE ONE TABLET BY MOUTH ONCE DAILY AS NEEDED Patient taking differently: TAKE ONE TABLET BY MOUTH ONCE DAILY AS NEEDED FOR SWELLING 09/06/14  Yes Venia Carbon, MD  Glucosamine HCl (GLUCOSAMINE PO) Take 1,500 mg by mouth daily.   Yes Historical Provider, MD  hydroxychloroquine (PLAQUENIL) 200 MG tablet Take 400 mg by mouth at bedtime.    Yes Historical Provider, MD  levothyroxine (SYNTHROID, LEVOTHROID) 75 MCG tablet TAKE ONE TABLET BY MOUTH ONCE DAILY BEFORE BREAKFAST 09/19/14  Yes Venia Carbon, MD  magnesium hydroxide (MILK OF MAGNESIA) 400 MG/5ML suspension Take 15 mLs by mouth daily as needed for mild constipation or moderate constipation.    Yes Historical Provider, MD  methotrexate (RHEUMATREX) 2.5 MG tablet  Take 12.5 mg by mouth once a week. On Wednesday   Yes Historical Provider, MD  Multiple Vitamins-Minerals (ONE-A-DAY 50 PLUS PO) Take 1 tablet by mouth daily.    Yes Historical Provider, MD  omeprazole (PRILOSEC) 40 MG capsule Take 40 mg by mouth daily.   Yes Historical Provider, MD  OxyCODONE (OXYCONTIN) 10 mg T12A 12 hr tablet Take 10 mg by mouth every 12 (twelve) hours.  03/13/15  Yes Historical Provider, MD  oxyCODONE (ROXICODONE) 15 MG immediate release tablet Take 15 mg by mouth 4 (four) times daily as needed for pain.   Yes Historical Provider, MD  polyethylene glycol (MIRALAX / GLYCOLAX) packet Take 17 g by mouth daily as needed for mild constipation, moderate constipation or severe constipation.   Yes Historical Provider, MD      VITAL SIGNS:  Blood pressure 100/65, pulse 78, temperature 98.3 F (36.8 C), temperature source Oral, resp. rate 13, height 5' 5.5" (1.664 m), weight 50.349 kg (111 lb), SpO2 100 %.  PHYSICAL EXAMINATION:  GENERAL:  81 y.o.-year-old patient lying in the bed with no acute distress. Thin EYES: Pupils equal, round, reactive to light and accommodation. No scleral icterus. Extraocular muscles intact.  HEENT: Head atraumatic, normocephalic. Oropharynx and nasopharynx clear.  NECK:  Supple, no jugular venous distention. No thyroid enlargement, no tenderness.  LUNGS: Normal breath sounds bilaterally, no wheezing, rales,rhonchi or crepitation. No use of accessory muscles of respiration.  CARDIOVASCULAR: S1, S2 normal. No murmurs, rubs, or gallops.  ABDOMEN: Soft, nontender, nondistended. Bowel sounds present. No organomegaly or mass.  EXTREMITIES: Bilateral lower extremity purplish discoloration suggestive of chronic venous insufficiency with large necrotic ulcers both lower extremities with serosanguineous discharge. Good pedal pulses. NEUROLOGIC: Cranial nerves II through XII are intact. Muscle strength 5/5 in all extremities. Sensation intact. Gait not checked.   PSYCHIATRIC: The patient is alert and oriented x 3.  SKIN: As above  LABORATORY PANEL:   CBC  Recent Labs Lab 05/08/15 1437  WBC 16.3*  HGB 8.4*  HCT 26.3*  PLT 565*   ------------------------------------------------------------------------------------------------------------------  Chemistries   Recent Labs Lab 05/08/15 1326  NA 130*  K 4.6  CL 94*  CO2 25  GLUCOSE 105*  BUN 31*  CREATININE 1.31*  CALCIUM 8.7*    IMPRESSION AND PLAN:   81 y.o. female with a known history of chronic A. fib on eliquis, chronic venous insufficiency, rheumatoid arthritis on methotrexate and Plaquenil, hypothyroidism, history of GI bleed secondary to AVMs comes to the emergency room with bilateral lower extremity necrotic-looking ulcers.  1. Bilateral necrotizing ulcers etiology unclear. Venous versus vascular versus vasculitis. Admit patient to surgical floor IV Vanco  Follow-up wound culture and blood cultures Spoke with surgery about possible biopsy recommended given severity of ulcers and will aggravate the problem and at this time does not recommend biopsy. Consult vascular surgery. Consult rheumatology.   2. History of chronic A. fib on eliquis Spoke with Dr. a R IDA and will hold off on [place]'s at present. According to his knowledge not on aware if ELIQUIS would cause such necrotizing ulcers. He recommends hold off on the medication at present.  3. History of rheumatoid arthritis Continue methotrexate and Plaquenil   4. Leukocytosis suspected due to #1   5. Hypothyroidism continue Synthroid   All the records are reviewed and case discussed with ED provider. Management plans discussed with the patient, family and they are in agreement.  CODE STATUS: Full  TOTAL TIME TAKING CARE OF THIS PATIENT: 50  minutes.    Cathi Hazan M.D on 05/08/2015 at 4:31 PM  Between 7am to 6pm - Pager - [phone]  After 6pm go to [email or web address] - password [email or web address]  Office  [phone]  CC: Primary care physician; Viviana Simpler, MD

## 2015-05-08 NOTE — Progress Notes (Signed)
[name], [name] ([id number]) Visit Report for 05/07/2015 Chief Complaint Document Details Patient Name: [name], [name]. Date of Service: 05/07/2015 2:45 PM Medical Record Number: [id number] Patient Account Number: [id number] Date of Birth/Sex: 1926/12/01 (81 y.o. Female) Treating RN: Cornell Barman Primary Care Physician: Viviana Simpler Other Clinician: Referring Physician: Viviana Simpler Treating Physician/Extender: BURNS III, Charlean Sanfilippo in Treatment: 3 Information Obtained from: Patient Chief Complaint Bilateral calf ulcerations since early October 2016. Electronic Signature(s) Signed: 05/07/2015 4:23:42 PM By: Loletha Grayer MD Entered By: Loletha Grayer on 05/07/2015 16:11:15 [name], [name] ([id number]) -------------------------------------------------------------------------------- HPI Details Patient Name: [name]. Date of Service: 05/07/2015 2:45 PM Medical Record Number: [id number] Patient Account Number: [id number] Date of Birth/Sex: Jun 10, 1927 (81 y.o. Female) Treating RN: Cornell Barman Primary Care Physician: Viviana Simpler Other Clinician: Referring Physician: Viviana Simpler Treating Physician/Extender: BURNS III, Charlean Sanfilippo in Treatment: 3 History of Present Illness HPI Description: 81 year old with history of chronic venous insufficiency, atrial fibrillation (on Eliquis), rheumatoid arthritis (on methotrexate), COPD, and chronic kidney disease. No h/o DM or PAD. She developed bilateral calf ulcerations in early October 2016. Denies any trauma. No new medications. Usually wears compression stockings but has not been wearing them since she developed the ulcerations. She reports significant pain with pressure. No ischemic rest pain or claudication. Pain improved with leg elevation. Culture 04/16/2015 grew MRSA sensitive to tetracycline, clindamycin, and Cipro. She completed a one- week course of doxycycline and experienced worsening itching. Switched to  clindamycin, which she has completed (caused nausea). Venous ultrasound 04/25/2015 showed no evidence for DVT or significant venous reflux bilaterally. She has been applying mupirocin cream and hydrogel and using a juxtalite for edema control. Receiving [place] support. Awaiting dermatology consult. She returns to clinic for follow-up and says that her pain persists. Improved with leg elevation. No fever or chills. Minimal drainage. Electronic Signature(s) Signed: 05/07/2015 4:23:42 PM By: Loletha Grayer MD Entered By: Loletha Grayer on 05/07/2015 16:19:39 [name], [name] ([id number]) -------------------------------------------------------------------------------- Physical Exam Details Patient Name: [name], [name]. Date of Service: 05/07/2015 2:45 PM Medical Record Number: [id number] Patient Account Number: [id number] Date of Birth/Sex: [birth year] (81 y.o. Female) Treating RN: Cornell Barman Primary Care Physician: Viviana Simpler Other Clinician: Referring Physician: Viviana Simpler Treating Physician/Extender: BURNS III, Charlean Sanfilippo in Treatment: 3 Constitutional . Pulse regular. Respirations normal and unlabored. Afebrile. Marland Kitchen Respiratory WNL. No retractions.. Cardiovascular Pedal Pulses WNL. Integumentary (Hair, Skin) .Marland Kitchen Neurological . Psychiatric Judgement and insight Intact.. Oriented times 3.. . Notes Bilateral lower extremity ulcerations covered with dry adherent eschar. Minimally improved on the left. Cellulitis resolved. 2+ pitting edema involving calves and feet. Palpable DP and PT bilaterally. Electronic Signature(s) Signed: 05/07/2015 4:23:42 PM By: Loletha Grayer MD Entered By: Loletha Grayer on 05/07/2015 16:21:14 [name] ([id number]) -------------------------------------------------------------------------------- Physician Orders Details Patient Name: [name], [name]. Date of Service: 05/07/2015 2:45 PM Medical Record Number:  [id number] Patient Account Number: [id number] Date of Birth/Sex: May 09, 1927 (81 y.o. Female) Treating RN: Cornell Barman Primary Care Physician: Viviana Simpler Other Clinician: Referring Physician: Viviana Simpler Treating Physician/Extender: BURNS III, Charlean Sanfilippo in Treatment: 3 Verbal / Phone Orders: Yes Clinician: Cornell Barman Read Back and Verified: Yes Diagnosis Coding Wound Cleansing Wound #6 Left,Circumferential Lower Leg o Clean wound with Normal Saline. Wound #7 Right,Medial Lower Leg o Clean wound with Normal Saline. Anesthetic Wound #6 Left,Circumferential Lower Leg o Topical Lidocaine 4% cream applied to wound bed prior to debridement Wound #7 Right,Medial Lower Leg o Topical Lidocaine 4%  cream applied to wound bed prior to debridement Primary Wound Dressing Wound #6 Left,Circumferential Lower Leg o Other: - betadine paint to black eschar; Mupiricin cream to reddened areas Wound #7 Right,Medial Lower Leg o Other: - betadine paint to black eschar; Mupiricin cream to reddened areas Secondary Dressing Wound #6 Left,Circumferential Lower Leg o Conform/Kerlix - Kerlix and Coban WRAPPED LIGHTLY from base of toes to 3cm below knee. o Non-adherent pad Wound #7 Right,Medial Lower Leg o Conform/Kerlix - Kerlix and Coban WRAPPED LIGHTLY from base of toes to 3cm below knee. o Non-adherent pad Dressing Change Frequency Wound #6 Left,Circumferential Lower Leg o Other: - Dressing to be changed on Monday, Thursday and Friday by [facility] Wound #7 Right,Medial Lower Leg o Other: - Dressing to be changed on Monday, Thursday and Friday by [facility] [name], [name] ([id number]) Follow-up Appointments Wound #6 Left,Circumferential Lower Leg o Return Appointment in 1 week. Wound #7 Right,Medial Lower Leg o Return Appointment in 1 week. Home Health Wound #6 Left,Circumferential Lower Leg o Continue Home Health Visits - [place] Nurse may visit PRN  to address patientos wound care needs. o FACE TO FACE ENCOUNTER: MEDICARE and MEDICAID PATIENTS: I certify that this patient is under my care and that I had a face-to-face encounter that meets the physician face-to-face encounter requirements with this patient on this date. The encounter with the patient was in whole or in part for the following MEDICAL CONDITION: (primary reason for [place]) MEDICAL NECESSITY: I certify, that based on my findings, NURSING services are a medically necessary home health service. HOME BOUND STATUS: I certify that my clinical findings support that this patient is homebound (i.e., Due to illness or injury, pt requires aid of supportive devices such as crutches, cane, wheelchairs, walkers, the use of special transportation or the assistance of another person to leave their place of residence. There is a normal inability to leave the home and doing so requires considerable and taxing effort. Other absences are for medical reasons / religious services and are infrequent or of short duration when for other reasons). o If current dressing causes regression in wound condition, may D/C ordered dressing product/s and apply Normal Saline Moist Dressing daily until next [place] / Other MD appointment. [place] of regression in wound condition at [phone]. o Please direct any NON-WOUND related issues/requests for orders to patient's Primary Care Physician Wound #7 Right,Medial Lower Leg o [place] Visits - [place] Nurse may visit PRN to address patientos wound care needs. o FACE TO FACE ENCOUNTER: MEDICARE and MEDICAID PATIENTS: I certify that this patient is under my care and that I had a face-to-face encounter that meets the physician face-to-face encounter requirements with this patient on this date. The encounter with the patient was in whole or in part for the following MEDICAL  CONDITION: (primary reason for [place]) MEDICAL NECESSITY: I certify, that based on my findings, NURSING services are a medically necessary home health service. HOME BOUND STATUS: I certify that my clinical findings support that this patient is homebound (i.e., Due to illness or injury, pt requires aid of supportive devices such as crutches, cane, wheelchairs, walkers, the use of special transportation or the assistance of another person to leave their place of residence. There is a normal inability to leave the home and doing so requires considerable and taxing effort. Other absences are for medical reasons / religious services and are infrequent or of short duration when  for other reasons). o If current dressing causes regression in wound condition, may D/C ordered dressing product/s and apply Normal Saline Moist Dressing daily until next [place] / Other MD appointment. [place] of regression in wound condition at [phone]. [name], [name] ([id number]) o Please direct any NON-WOUND related issues/requests for orders to patient's Primary Care Physician Notes Patient to make appointments with Cardiologist Rockey Situ), Dermatology and Rheumatologist Precious Reel). Electronic Signature(s) Signed: 05/07/2015 5:27:52 PM By: Gretta Cool RN, BSN, Kim RN, BSN Signed: 05/08/2015 7:43:00 AM By: Loletha Grayer MD Previous Signature: 05/07/2015 4:23:42 PM Version By: Loletha Grayer MD Entered By: Gretta Cool RN, BSN, Kim on 05/07/2015 16:24:40 [place], [name] ([id number]) -------------------------------------------------------------------------------- Problem List Details Patient Name: [name], [name]. Date of Service: 05/07/2015 2:45 PM Medical Record Number: [id number] Patient Account Number: [id number] Date of Birth/Sex: [birth year] (81 y.o. 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 04/16/2015 Yes calf and inflammation I83.222 Varicose veins of left lower extremity with both ulcer of 04/16/2015 Yes calf and inflammation I48.2 Chronic atrial fibrillation 04/16/2015 Yes I87.2 Venous insufficiency (chronic) (peripheral) 04/16/2015 Yes J44.9 Chronic obstructive pulmonary disease, unspecified 04/16/2015 Yes N18.9 Chronic kidney disease, unspecified 04/16/2015 Yes M79.604 Pain in right leg 04/30/2015 Yes M79.605 Pain in left leg 04/30/2015 Yes Inactive Problems Resolved Problems ICD-10 Code Description Active Date Resolved Date L03.115 Cellulitis of right lower limb 04/16/2015 04/16/2015 [name], [name] ([id number]) L03.116 Cellulitis of left lower limb 04/16/2015 04/16/2015 Electronic Signature(s) Signed: 05/07/2015 4:23:42 PM By: Loletha Grayer MD Entered By: Loletha Grayer on 05/07/2015 16:11:04 [place], [name] ([id number]) -------------------------------------------------------------------------------- Progress Note Details Patient Name: [name] Date of Service: 05/07/2015 2:45 PM Medical Record Number: [id number] Patient Account Number: [id number] Date of Birth/Sex: [birth year] (81 y.o. Female) Treating RN: Cornell Barman Primary Care Physician: Viviana Simpler Other Clinician: Referring Physician: Viviana Simpler Treating Physician/Extender: BURNS III, Charlean Sanfilippo in Treatment: 3 Subjective Chief Complaint Information obtained from Patient Bilateral calf ulcerations since early October 2016. History of Present Illness (HPI) 81 year old with history of chronic venous insufficiency, atrial fibrillation (on Eliquis), rheumatoid arthritis (on methotrexate), COPD, and chronic kidney disease. No h/o DM or PAD. She  developed bilateral calf ulcerations in early October 2016. Denies any trauma. No new medications. Usually wears compression stockings but has not been wearing them since she developed the ulcerations. She reports significant pain with pressure. No ischemic rest pain or claudication. Pain improved with leg elevation. Culture 04/16/2015 grew MRSA sensitive to tetracycline, clindamycin, and Cipro. She completed a one- week course of doxycycline and experienced worsening itching. Switched to clindamycin, which she has completed (caused nausea). Venous ultrasound 04/25/2015 showed no evidence for DVT or significant venous reflux bilaterally. She has been applying mupirocin cream and hydrogel and using a juxtalite for edema control. Receiving [place] support. Awaiting dermatology consult. She returns to clinic for follow-up and says that her pain persists. Improved with leg elevation. No fever or chills. Minimal drainage. Objective Constitutional Pulse regular. Respirations normal and unlabored. Afebrile. Vitals Time Taken: 3:43 PM, Height: 62 in, Weight: 115 lbs, BMI: 21, Temperature: 98.4 F, Pulse: 93 bpm, Respiratory Rate: 20 breaths/min, Blood Pressure: 103/54 mmHg. Respiratory WNL. No retractions.Marland Kitchen MOSSIE, GILDER ([id number]) Cardiovascular Pedal Pulses WNL. Psychiatric Judgement and insight Intact.. Oriented  times 3.. General Notes: Bilateral lower extremity ulcerations covered with dry adherent eschar. Minimally improved on the left. Cellulitis resolved. 2+ pitting edema involving calves and feet. Palpable DP and PT bilaterally. Integumentary (Hair, Skin) Wound #6 status is Open. Original cause of wound was Gradually Appeared. The wound is located on the Left,Circumferential Lower Leg. The wound measures 13cm length x 20cm width x 0.1cm depth; 204.204cm^2 area and 20.42cm^3 volume. Wound #7 status is Open. Original cause of wound was Gradually Appeared. The wound is located on  the Right,Medial Lower Leg. The wound measures 16.5cm length x 16cm width x 0.2cm depth; 207.345cm^2 area and 41.469cm^3 volume. The wound is limited to skin breakdown. There is a medium amount of serosanguineous drainage noted. The wound margin is indistinct and nonvisible. There is small (1-33%) red granulation within the wound bed. There is a large (67-100%) amount of necrotic tissue within the wound bed including Eschar. The periwound skin appearance exhibited: Localized Edema, Moist, Mottled, Pallor. The periwound skin appearance did not exhibit: Callus, Crepitus, Excoriation, Fluctuance, Friable, Induration, Rash, Scarring, Dry/Scaly, Maceration, Atrophie Blanche, Cyanosis, Ecchymosis, Hemosiderin Staining, Rubor, Erythema. Periwound temperature was noted as No Abnormality. The periwound has tenderness on palpation. 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 ([id number]) BLE ulcers of unclear etiology. BLE edema. Plan Wound Cleansing: Wound #6 Left,Circumferential Lower Leg: Clean wound with Normal Saline. Wound #7 Right,Medial Lower Leg: Clean wound with Normal Saline. Anesthetic: Wound #6 Left,Circumferential Lower Leg: Topical Lidocaine 4% cream applied to wound bed prior to debridement Wound #7 Right,Medial Lower Leg: Topical Lidocaine 4% cream applied to wound bed prior to debridement Primary Wound Dressing: Wound #6 Left,Circumferential Lower Leg: Other: - betadine paint to black eschar; Mupiricin cream to reddened areas Wound #7  Right,Medial Lower Leg: Other: - betadine paint to black eschar; Mupiricin cream to reddened areas Secondary Dressing: Wound #6 Left,Circumferential Lower Leg: Non-adherent pad Conform/Kerlix - Kerlix and Coban WRAPPED LIGHTLY from base of toes to 3cm below knee. Wound #7 Right,Medial Lower Leg: Non-adherent pad Conform/Kerlix - Kerlix and Coban WRAPPED LIGHTLY from base of toes to 3cm below knee. Dressing Change Frequency: Wound #6 Left,Circumferential Lower Leg: Other: - Dressing to be changed on Monday, Thursday and Friday by [facility] Wound #7 Right,Medial Lower Leg: Other: - Dressing to be changed on Monday, Thursday and Friday by [facility] Follow-up Appointments: Wound #6 Left,Circumferential Lower Leg: Return Appointment in 1 week. Wound #7 Right,Medial Lower Leg: Return Appointment in 1 week. Home Health: Wound #6 Left,Circumferential Lower Leg: Continue Home Health Visits - [place] Nurse may visit PRN to address patient s wound care needs. FACE TO FACE ENCOUNTER: MEDICARE and MEDICAID PATIENTS: I certify that this patient is under my care and that I had a face-to-face encounter that meets the physician face-to-face encounter requirements with this patient on this date. The encounter with the patient was in whole or in part for the following MEDICAL CONDITION: (primary reason for [place]) MEDICAL NECESSITY: I certify, that based on my findings, NURSING services are a medically necessary home health service. HOME BOUND STATUS: I certify that my clinical findings support  that this patient is homebound (i.e., Due to illness or injury, pt requires aid of supportive devices such as crutches, cane, wheelchairs, walkers, the use of special transportation or the assistance of another person to leave their place of residence. There is a normal inability to leave the home and doing so requires considerable and taxing effort. Other absences are CASHAY, MANGANELLI  ([id number]) for medical reasons / religious services and are infrequent or of short duration when for other reasons). If current dressing causes regression in wound condition, may D/C ordered dressing product/s and apply Normal Saline Moist Dressing daily until next [place] / Other MD appointment. [place] of regression in wound condition at [phone]. Please direct any NON-WOUND related issues/requests for orders to patient's Primary Care Physician Wound #7 Right,Medial Lower Leg: [place] Visits - [place] Nurse may visit PRN to address patient s wound care needs. FACE TO FACE ENCOUNTER: MEDICARE and MEDICAID PATIENTS: I certify that this patient is under my care and that I had a face-to-face encounter that meets the physician face-to-face encounter requirements with this patient on this date. The encounter with the patient was in whole or in part for the following MEDICAL CONDITION: (primary reason for [place]) MEDICAL NECESSITY: I certify, that based on my findings, NURSING services are a medically necessary home health service. HOME BOUND STATUS: I certify that my clinical findings support that this patient is homebound (i.e., Due to illness or injury, pt requires aid of supportive devices such as crutches, cane, wheelchairs, walkers, the use of special transportation or the assistance of another person to leave their place of residence. There is a normal inability to leave the home and doing so requires considerable and taxing effort. Other absences are for medical reasons / religious services and are infrequent or of short duration when for other reasons). If current dressing causes regression in wound condition, may D/C ordered dressing product/s and apply Normal Saline Moist Dressing daily until next [place] / Other MD appointment. [place] of regression in wound condition at  [phone]. Please direct any NON-WOUND related issues/requests for orders to patient's Primary Care Physician General Notes: Patient to make appointments with Cardiologist and Dermatology. Mupirocin cream to ulcers. Betadine paint to eshcar. Tubigrip or Juxtalite for edema control. Frequent leg elevation. DC clindamycin. Dermatology consult. Rheumatology consult. Electronic Signature(s) Signed: 05/07/2015 4:23:42 PM By: Loletha Grayer MD Entered By: Loletha Grayer on 05/07/2015 16:22:49 [name] ([id number]) -------------------------------------------------------------------------------- SuperBill Details Patient Name: [name] Date of Service: 05/07/2015 Medical Record Number: [id number] Patient Account Number: [id number] Date of Birth/Sex: Feb 08, 1927 (81 y.o. 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 calf and inflammation I83.222 Varicose veins of left lower extremity with both ulcer of calf and inflammation I48.2 Chronic atrial fibrillation I87.2 Venous insufficiency (chronic) (peripheral) J44.9 Chronic obstructive pulmonary disease, unspecified N18.9 Chronic kidney disease, unspecified M79.604 Pain in right leg M79.605 Pain in left leg Facility Procedures CPT4 Code: [id number] Description: 99213 - WOUND CARE VISIT-LEV 3 EST PT Modifier: Quantity: 1 Physician Procedures CPT4: Description Modifier Quantity Code [id number] 99214 - WC PHYS LEVEL 4 - EST PT 1 ICD-10 Description Diagnosis I83.212 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

## 2015-05-08 NOTE — Progress Notes (Signed)
PHARMACIST - PHYSICIAN ORDER COMMUNICATION  CONCERNING: P&T Medication Policy on Herbal Medications  DESCRIPTION:  This patient's order for:  Biotin, Chondroitin, Glucocasmine  has been noted.  This product(s) is classified as an "herbal" or natural product. Due to a lack of definitive safety studies or FDA approval, nonstandard manufacturing practices, plus the potential risk of unknown drug-drug interactions while on inpatient medications, the Pharmacy and Therapeutics Committee does not permit the use of "herbal" or natural products of this type within [facility].   ACTION TAKEN: The pharmacy department is unable to verify this order at this time and your patient has been informed of this safety policy. Please reevaluate patient's clinical condition at discharge and address if the herbal or natural product(s) should be resumed at that time.

## 2015-05-08 NOTE — Progress Notes (Signed)
ANTIBIOTIC CONSULT NOTE - INITIAL  Pharmacy Consult for Vancomycin and Aztreonam Indication: Bilateral nonhealing ulcers  Allergies  Allergen Reactions  . Dabigatran Etexilate Mesylate Shortness Of Breath  . Coumadin [Warfarin Sodium] Other (See Comments)    hematoma  . Doxycycline Hyclate [Doxycycline] Itching  . Penicillins     Per pt anaphylactic reaction, years ago at age 81 (in college)  . Pradaxa [Dabigatran Etexilate Mesylate] Other (See Comments)    SLEEP  . Sulfonamide Derivatives   . Tape Other (See Comments)    blisters  . Tramadol Hcl     Itching     Patient Measurements: Height: 5' 5.5" (166.4 cm) Weight: 111 lb (50.349 kg) IBW/kg (Calculated) : 58.15   Vital Signs: Temp: 98.3 F (36.8 C) (11/03 1313) Temp Source: Oral (11/03 1257) BP: 129/112 mmHg (11/03 1615) Pulse Rate: 74 (11/03 1615) Intake/Output from previous day:   Intake/Output from this shift:    Labs:  Recent Labs  05/08/15 1326 05/08/15 1437  WBC  --  16.3*  HGB  --  8.4*  PLT  --  565*  CREATININE 1.31*  --    Estimated Creatinine Clearance: 24.5 mL/min (by C-G formula based on Cr of 1.31). No results for input(s): VANCOTROUGH, VANCOPEAK, VANCORANDOM, GENTTROUGH, GENTPEAK, GENTRANDOM, TOBRATROUGH, TOBRAPEAK, TOBRARND, AMIKACINPEAK, AMIKACINTROU, AMIKACIN in the last 72 hours.   Microbiology: Recent Results (from the past 720 hour(s))  Wound culture     Status: None   Collection Time: 04/16/15 10:56 AM  Result Value Ref Range Status   Specimen Description ABSCESS  Final   Special Requests NONE  Final   Gram Stain   Final    RARE WBC SEEN MODERATE GRAM POSITIVE COCCI MODERATE GRAM POSITIVE RODS    Culture   Final    HEAVY GROWTH METHICILLIN RESISTANT STAPHYLOCOCCUS AUREUS HEAVY GROWTH COMAMONAS ACIDOVORANS CRITICAL RESULT CALLED TO, READ BACK BY AND VERIFIED WITH: RITA AFFUL AT 2751 04/23/15 DV    Report Status 04/23/2015 FINAL  Final   Organism ID, Bacteria METHICILLIN  RESISTANT STAPHYLOCOCCUS AUREUS  Final   Organism ID, Bacteria COMAMONAS ACIDOVORANS  Final      Susceptibility   Comamonas acidovorans - MIC*    CEFTRIAXONE 2 SENSITIVE Sensitive     CIPROFLOXACIN <=0.25 SENSITIVE Sensitive     GENTAMICIN >=16 RESISTANT Resistant     IMIPENEM <=0.25 SENSITIVE Sensitive     TRIMETH/SULFA <=20 SENSITIVE Sensitive     * HEAVY GROWTH COMAMONAS ACIDOVORANS   Methicillin resistant staphylococcus aureus - MIC*    CIPROFLOXACIN <=0.5 SENSITIVE Sensitive     GENTAMICIN <=0.5 SENSITIVE Sensitive     OXACILLIN >=4 RESISTANT Resistant     VANCOMYCIN 1 SENSITIVE Sensitive     TRIMETH/SULFA <=10 SENSITIVE Sensitive     CEFOXITIN SCREEN Value in next row Resistant      POSITIVECEFOXITIN SCREEN - This test may be used to predict mecA-mediated oxacillin resistance, and it is based on the cefoxitin disk screen test.  The cefoxitin screen and oxacillin work in combination to determine the final interpretation reported for oxacillin.     Inducible Clindamycin Value in next row Sensitive      POSITIVECEFOXITIN SCREEN - This test may be used to predict mecA-mediated oxacillin resistance, and it is based on the cefoxitin disk screen test.  The cefoxitin screen and oxacillin work in combination to determine the final interpretation reported for oxacillin.     TETRACYCLINE Value in next row Sensitive      SENSITIVE<=1    *  HEAVY GROWTH METHICILLIN RESISTANT STAPHYLOCOCCUS AUREUS    Medical History: Past Medical History  Diagnosis Date  . Arthritis   . Hyperlipidemia   . Hypothyroidism   . Atrial fibrillation ([place])     a. s/p prior DCCV's, now persistent-->Xarelto (CHA2DS2VASc = 3).  . Diverticulosis     a. by colonoscopy, h/o colon adenoma ([facility])  . Gastritis and duodenitis     a. 01/2014 EGD: Moderate non-erosive gastritis and mild non-erosive duodenitis.    Assessment: 81 yo female here with bilateral necrotizing, nonhealing ulcers. Pt received a dose of  vancomycin 750 mg IV x1 in the ED at 1443.   Ke 0.025, half life 27.7 h, Vd 35.2 L BCx x2 sent  Goal of Therapy:  Vancomycin trough level 15-20 mcg/ml  Plan:  Will order Vancomycin 750 mg IV q36h to start 15 h after initial dose for stacked dosing. Vanc trough before third dose - 10/7 at 0530 Will need to continue to follow renal function - anticipate renal function will improve, which may require change in dose.  Will need to continue to follow culture results.   Will order aztreonam 500 mg IV q8h.   Pharmacy will continue to follow.   Rayna Sexton L 05/08/2015,4:48 PM

## 2015-05-08 NOTE — Telephone Encounter (Signed)
Okay 

## 2015-05-08 NOTE — ED Notes (Signed)
Attempted to call report to floor. Nurse unable to take report at this time and will return our call.

## 2015-05-08 NOTE — Telephone Encounter (Signed)
Patient called to update Dr.Letvak. Patient has appointment with Alphonsa Gin today at 11:15.  Patient is on a wait list for Dr.Gollan.

## 2015-05-09 ENCOUNTER — Encounter
Admission: EM | Disposition: A | Discharge status: Skilled Nursing Facility | Payer: Self-pay | Source: Home / Self Care | Attending: Internal Medicine

## 2015-05-09 DIAGNOSIS — I482 Chronic atrial fibrillation: Secondary | ICD-10-CM

## 2015-05-09 DIAGNOSIS — L97919 Non-pressure chronic ulcer of unspecified part of right lower leg with unspecified severity: Secondary | ICD-10-CM

## 2015-05-09 DIAGNOSIS — L97929 Non-pressure chronic ulcer of unspecified part of left lower leg with unspecified severity: Secondary | ICD-10-CM

## 2015-05-09 HISTORY — PX: PERIPHERAL VASCULAR CATHETERIZATION: SHX172C

## 2015-05-09 LAB — URINALYSIS COMPLETE WITH MICROSCOPIC (ARMC ONLY)
BACTERIA UA: NONE SEEN
Bilirubin Urine: NEGATIVE
Glucose, UA: NEGATIVE mg/dL
HGB URINE DIPSTICK: NEGATIVE
KETONES UR: NEGATIVE mg/dL
Leukocytes, UA: NEGATIVE
NITRITE: NEGATIVE
PH: 5 (ref 5.0–8.0)
PROTEIN: NEGATIVE mg/dL
SPECIFIC GRAVITY, URINE: 1.015 (ref 1.005–1.030)

## 2015-05-09 LAB — SEDIMENTATION RATE: Sed Rate: 51 mm/hr — ABNORMAL HIGH (ref 0–30)

## 2015-05-09 LAB — MRSA PCR SCREENING: MRSA by PCR: NEGATIVE

## 2015-05-09 SURGERY — LOWER EXTREMITY ANGIOGRAPHY
Anesthesia: Moderate Sedation | Laterality: Left

## 2015-05-09 SURGERY — LOWER EXTREMITY INTERVENTION
Wound class: Clean

## 2015-05-09 MED ORDER — IOHEXOL 300 MG/ML  SOLN
INTRAMUSCULAR | Status: DC | PRN
  Administered 2015-05-09: 65 mL via INTRA_ARTERIAL

## 2015-05-09 MED ORDER — CHLORHEXIDINE GLUCONATE CLOTH 2 % EX PADS
6.0000 | MEDICATED_PAD | Freq: Once | CUTANEOUS | Status: AC
  Administered 2015-05-09: 6 via TOPICAL

## 2015-05-09 MED ORDER — MIDAZOLAM HCL 2 MG/2ML IJ SOLN
INTRAMUSCULAR | Status: DC | PRN
  Administered 2015-05-09: 2 mg via INTRAVENOUS

## 2015-05-09 MED ORDER — LIDOCAINE-EPINEPHRINE (PF) 1 %-1:200000 IJ SOLN
INTRAMUSCULAR | Status: DC | PRN
  Administered 2015-05-09: 10 mL via INTRADERMAL

## 2015-05-09 MED ORDER — FENTANYL CITRATE (PF) 100 MCG/2ML IJ SOLN
INTRAMUSCULAR | Status: DC | PRN
  Administered 2015-05-09 (×2): 50 ug via INTRAVENOUS

## 2015-05-09 MED ORDER — ENSURE ENLIVE PO LIQD
237.0000 mL | Freq: Two times a day (BID) | ORAL | Status: DC
  Administered 2015-05-09 – 2015-05-13 (×6): 237 mL via ORAL

## 2015-05-09 MED ORDER — DEXTROSE 5 % IV SOLN
INTRAVENOUS | Status: AC
  Administered 2015-05-09: 15:00:00
  Filled 2015-05-09: qty 1.5

## 2015-05-09 MED ORDER — HEPARIN (PORCINE) IN NACL 2-0.9 UNIT/ML-% IJ SOLN
INTRAMUSCULAR | Status: AC
  Filled 2015-05-09: qty 1000

## 2015-05-09 MED ORDER — SODIUM CHLORIDE 0.9 % IV SOLN: INTRAVENOUS | Status: DC

## 2015-05-09 MED ORDER — FENTANYL CITRATE (PF) 100 MCG/2ML IJ SOLN
INTRAMUSCULAR | Status: AC
  Filled 2015-05-09: qty 2

## 2015-05-09 MED ORDER — LIDOCAINE-EPINEPHRINE (PF) 1 %-1:200000 IJ SOLN
INTRAMUSCULAR | Status: AC
  Filled 2015-05-09: qty 30

## 2015-05-09 MED ORDER — HEPARIN SODIUM (PORCINE) 1000 UNIT/ML IJ SOLN
INTRAMUSCULAR | Status: AC
  Filled 2015-05-09: qty 1

## 2015-05-09 MED ORDER — MIDAZOLAM HCL 5 MG/5ML IJ SOLN
INTRAMUSCULAR | Status: AC
Start: 2015-05-09 — End: 2015-05-09
  Filled 2015-05-09: qty 5

## 2015-05-09 MED ORDER — SODIUM CHLORIDE 0.9 % IV SOLN
INTRAVENOUS | Status: DC
  Administered 2015-05-09 – 2015-05-11 (×3): via INTRAVENOUS

## 2015-05-09 SURGICAL SUPPLY — 9 items
CATH ROYAL FLUSH PIG 5F 70CM (CATHETERS) ×3 IMPLANT
DEVICE STARCLOSE SE CLOSURE (Vascular Products) ×3 IMPLANT
DEVICE TORQUE (MISCELLANEOUS) ×3 IMPLANT
GLIDEWIRE ADV .035X260CM (WIRE) ×3 IMPLANT
PACK ANGIOGRAPHY (CUSTOM PROCEDURE TRAY) ×3 IMPLANT
SHEATH BRITE TIP 5FRX11 (SHEATH) ×3 IMPLANT
SYR MEDRAD MARK V 150ML (SYRINGE) ×3 IMPLANT
TUBING CONTRAST HIGH PRESS 72 (TUBING) ×3 IMPLANT
WIRE J 3MM .035X145CM (WIRE) ×3 IMPLANT

## 2015-05-09 NOTE — Consult Note (Signed)
[facility] Vascular Consult Note  MRN : [id number]  [name] is a 81 y.o. (January 22, 1927) female who presents with chief complaint of  Chief Complaint  Patient presents with  . Wound Infection  .  History of Present Illness: Patient admitted to the hospital with worsening lower extremity ulcerations and pain. These now been present for several months are not improving. She is undergone a previous venous evaluation the office which was unrevealing. She has multiple medical comorbidities including cardiac arrhythmias and heart disease as well as chronic kidney disease which are likely exacerbating her swelling. [facility] consult for evaluation of her arterial perfusion as well as consideration for debridement. She is still having pain although it is well controlled. She has no other complaints today.  Current Facility-Administered Medications  Medication Dose Route Frequency Provider Last Rate Last Dose  . 0.9 %  sodium chloride infusion   Intravenous Continuous Algernon Huxley, MD      . 0.9 %  sodium chloride infusion   Intravenous Continuous Epifanio Lesches, MD 75 mL/hr at 05/09/15 1049    . acetaminophen (TYLENOL) tablet 650 mg  650 mg Oral Q6H PRN Fritzi Mandes, MD       Or  . acetaminophen (TYLENOL) suppository 650 mg  650 mg Rectal Q6H PRN Fritzi Mandes, MD      . atenolol (TENORMIN) tablet 25 mg  25 mg Oral Daily Fritzi Mandes, MD   25 mg at 05/09/15 0851  . aztreonam (AZACTAM) 500 mg in dextrose 5 % 50 mL IVPB  500 mg Intravenous 3 times per day Fritzi Mandes, MD   500 mg at 05/09/15 0453  . calcium carbonate (TUMS - dosed in mg elemental calcium) chewable tablet 1,250 mg  1,250 mg Oral Daily Fritzi Mandes, MD   1,250 mg at 05/09/15 0849  . Chlorhexidine Gluconate Cloth 2 % PADS 6 each  6 each Topical Once Algernon Huxley, MD      . docusate sodium (COLACE) capsule 100 mg  100 mg Oral BID Fritzi Mandes, MD   100 mg at 05/09/15 0851  . ferrous sulfate tablet 325 mg  325 mg Oral  Daily Fritzi Mandes, MD   325 mg at 05/09/15 0852  . folic acid (FOLVITE) tablet 2 mg  2 mg Oral Daily Fritzi Mandes, MD   2 mg at 05/09/15 0850  . furosemide (LASIX) tablet 40 mg  40 mg Oral Daily Fritzi Mandes, MD   40 mg at 05/09/15 0851  . hydroxychloroquine (PLAQUENIL) tablet 400 mg  400 mg Oral QHS Fritzi Mandes, MD   400 mg at 05/08/15 2154  . [START ON 05/10/2015] levothyroxine (SYNTHROID, LEVOTHROID) tablet 75 mcg  75 mcg Oral QAC breakfast Fritzi Mandes, MD      . magnesium hydroxide (MILK OF MAGNESIA) suspension 15 mL  15 mL Oral Daily PRN Fritzi Mandes, MD   15 mL at 05/08/15 1813  . ondansetron (ZOFRAN) tablet 4 mg  4 mg Oral Q6H PRN Fritzi Mandes, MD       Or  . ondansetron (ZOFRAN) injection 4 mg  4 mg Intravenous Q6H PRN Fritzi Mandes, MD      . oxyCODONE (Oxy IR/ROXICODONE) immediate release tablet 15 mg  15 mg Oral QID PRN Fritzi Mandes, MD   15 mg at 05/08/15 1813  . oxyCODONE (OXYCONTIN) 12 hr tablet 10 mg  10 mg Oral Q12H Fritzi Mandes, MD   10 mg at 05/09/15 0854  . polyethylene glycol (MIRALAX /  GLYCOLAX) packet 17 g  17 g Oral Daily PRN Fritzi Mandes, MD   17 g at 05/09/15 1610  . vancomycin (VANCOCIN) IVPB 750 mg/150 ml premix  750 mg Intravenous Q36H Fritzi Mandes, MD   750 mg at 05/09/15 0619  . vitamin B-12 (CYANOCOBALAMIN) tablet 2,000 mcg  2,000 mcg Oral Daily Fritzi Mandes, MD   2,000 mcg at 05/09/15 9604    Past Medical History  Diagnosis Date  . Arthritis   . Hyperlipidemia   . Hypothyroidism   . Atrial fibrillation ([place])     a. s/p prior DCCV's, now persistent-->Xarelto (CHA2DS2VASc = 3).  . Diverticulosis     a. by colonoscopy, h/o colon adenoma ([facility])  . Gastritis and duodenitis     a. 01/2014 EGD: Moderate non-erosive gastritis and mild non-erosive duodenitis.    Past Surgical History  Procedure Laterality Date  . Rotator cuff repair    . Intraocular lens implant, secondary  1996/ 2010  . Knee surgery      lateral  . Tonsillectomy    . Colonoscopy  02/2011    mod diverticulosis  ([facility])  . Total knee arthroplasty Bilateral     Social History Social History  Substance Use Topics  . Smoking status: Former Smoker    Types: Cigarettes  . Smokeless tobacco: Never Used  . Alcohol Use: Yes  No IVDU  Family History Family History  Problem Relation Age of Onset  . Heart disease Mother   no bleeding or clotting disorders  Allergies  Allergen Reactions  . Dabigatran Etexilate Mesylate Shortness Of Breath  . Coumadin [Warfarin Sodium] Other (See Comments)    hematoma  . Doxycycline Hyclate [Doxycycline] Itching  . Penicillins     Per pt anaphylactic reaction, years ago at age 6 (in college)  . Pradaxa [Dabigatran Etexilate Mesylate] Other (See Comments)    SLEEP  . Sulfonamide Derivatives   . Tape Other (See Comments)    blisters  . Tramadol Hcl     Itching      REVIEW OF SYSTEMS (Negative unless checked)  Constitutional: [x] Weight loss  [] Fever  [] Chills Cardiac: [] Chest pain   [] Chest pressure   [] Palpitations   [] Shortness of breath when laying flat   [] Shortness of breath at rest   [] Shortness of breath with exertion. Vascular:  [] Pain in legs with walking   [] Pain in legs at rest   [] Pain in legs when laying flat   [] Claudication   [] Pain in feet when walking  [] Pain in feet at rest  [] Pain in feet when laying flat   [] History of DVT   [] Phlebitis   [x] Swelling in legs   [] Varicose veins   [x] Non-healing ulcers Pulmonary:   [] Uses home oxygen   [] Productive cough   [] Hemoptysis   [] Wheeze  [] COPD   [] Asthma Neurologic:  [] Dizziness  [] Blackouts   [] Seizures   [] History of stroke   [] History of TIA  [] Aphasia   [] Temporary blindness   [] Dysphagia   [] Weakness or numbness in arms   [] Weakness or numbness in legs Musculoskeletal:  [] Arthritis   [] Joint swelling   [] Joint pain   [] Low back pain Hematologic:  [] Easy bruising  [] Easy bleeding   [] Hypercoagulable state   [] Anemic  [] Hepatitis Gastrointestinal:  [] Blood in stool   [] Vomiting blood   [] Gastroesophageal reflux/heartburn   [] Difficulty swallowing. Genitourinary:  [x] Chronic kidney disease   [] Difficult urination  [] Frequent urination  [] Burning with urination   [] Blood in urine Skin:  [] Rashes   [x] Ulcers   [  x]Wounds Psychological:  [] History of anxiety   []  History of major depression.  Physical Examination  Filed Vitals:   05/08/15 1715 05/08/15 1757 05/08/15 2007 05/09/15 0427  BP: 109/62 102/61 101/54 118/52  Pulse:  86 79 80  Temp:  98.4 F (36.9 C) 98.8 F (37.1 C) 98.9 F (37.2 C)  TempSrc:  Oral Oral Oral  Resp: 15 18 18 20   Height:      Weight:      SpO2:  100% 98% 95%   Body mass index is 18.18 kg/(m^2). Gen:  Thin, almost cachectic. Lying quietly in the bed Head: [place]/AT, temporalis wasting. Prominent temp pulse not noted. Ear/Nose/Throat: Hearing grossly intact, nares w/o erythema or drainage, oropharynx w/o Erythema/Exudate Eyes: PERRLA, EOMI.  Neck: Supple, no nuchal rigidity.  No bruit or JVD.  Pulmonary:  Good air movement, clear to auscultation bilaterally.  Cardiac: Irregular Vascular:  Vessel Right Left  Radial Palpable Palpable  Ulnar Palpable Palpable  Brachial Palpable Palpable  Carotid Palpable, without bruit Palpable, without bruit  Aorta Not palpable N/A  Femoral Palpable Palpable  Popliteal Palpable Palpable  PT Palpable  not Palpable  DP Palpable Palpable   Gastrointestinal: soft, non-tender/non-distended. No guarding/reflex. No masses, surgical incisions, or scars. Musculoskeletal: Diffusely weak.    No deformity or atrophy. Moderate lower extremity edema Neurologic: CN 2-12 intact. Pain and light touch intact in extremities.  Symmetrical.  Speech is fluent. Motor exam as listed above. Psychiatric: Judgment intact, Mood & affect appropriate for pt's clinical situation. Dermatologic: Severe stasis dermatitis in both lower extremities. Ulcers with necrotic eschar present throughout both extremities just above the ankle and in the  lower calf area area Lymph : No Cervical, Axillary, or Inguinal lymphadenopathy.      CBC Lab Results  Component Value Date   WBC 16.3* 05/08/2015   HGB 8.4* 05/08/2015   HCT 26.3* 05/08/2015   MCV 88.4 05/08/2015   PLT 565* 05/08/2015    BMET    Component Value Date/Time   NA 130* 05/08/2015 1326   NA 140 03/07/2014 1430   K 4.6 05/08/2015 1326   CL 94* 05/08/2015 1326   CO2 25 05/08/2015 1326   GLUCOSE 105* 05/08/2015 1326   GLUCOSE 95 03/07/2014 1430   BUN 31* 05/08/2015 1326   BUN 27 03/07/2014 1430   CREATININE 1.31* 05/08/2015 1326   CALCIUM 8.7* 05/08/2015 1326   GFRNONAA 36* 05/08/2015 1326   GFRAA 41* 05/08/2015 1326   Estimated Creatinine Clearance: 24.5 mL/min (by C-G formula based on Cr of 1.31).  COAG Lab Results  Component Value Date   INR 1.9 08/16/2013   INR 3.4 07/16/2013   INR 2.4 06/18/2013    Radiology No results found.    Assessment/Plan 1. Bilateral lower extremity ulcers with necrotic eschar. Extensive and nonhealing. Previous venous workup has been unrevealing. No arterial evaluation has been performed. Even know her distal perfusion appears intact, I think given the extensive nature of the wounds and the clearly limb threatening nature of the situation would warrant that these be evaluated more thoroughly with angiography. Risks and benefits are discussed and if significant disease is encountered, concomitant treatment would be planned. Surgical debridement and potential biopsy may also be of benefit and can be performed after her arterial status is evaluated 2. Chronic kidney disease. Pretty significant. Likely contributing to her lower extremity swelling.   Jacqueline Elliott, MD  05/09/2015 1:35 PM

## 2015-05-09 NOTE — Progress Notes (Addendum)
Pt resting in bed with family visiting. Tolerating IV abx with no adverse effect noted. Consent obtained for Angiogram this afternoon. Also, pt requested cardiology consult today. Notified MD. Consult set-up by Dr. Letitia Libra.  Pt expressed interest in Rehab at [facility] due to weakness, decreased activity tolerance/mobility. Pt would prefer to go to [facility], stated she has been there before. Notified SW.

## 2015-05-09 NOTE — Op Note (Signed)
[facility] Percutaneous Study/Intervention Procedural Note   Date of Surgery: 05/09/2015  Surgeon(s):DEW,JASON   Assistants:none  Pre-operative Diagnosis: Ulcerations BLE  Post-operative diagnosis: Same  Procedure(s) Performed: 1. Ultrasound guidance for vascular access right femoral artery 2. Catheter placement into left SFA from right femoral approach 3. Aortogram and selective bilateral lower extremity angiograms 4. StarClose closure device right femoral artery  EBL: Minimal  Indications: Patient is a 81 year old female with nonhealing ulcerations of both legs with no improvement despite optimal wound care. Previous venous workup was negative. The patient is brought in for angiography for further evaluation and potential treatment. Risks and benefits are discussed and informed consent is obtained  Procedure: The patient was identified and appropriate procedural time out was performed. The patient was then placed supine on the table and prepped and draped in the usual sterile fashion. Ultrasound was used to evaluate the right common femoral artery. It was patent . A digital ultrasound image was acquired. A Seldinger needle was used to access the right common femoral artery under direct ultrasound guidance and a permanent image was performed. A 0.035 J wire was advanced without resistance and a 5Fr sheath was placed. Pigtail catheter was placed into the aorta and an AP aortogram was performed. This demonstrated normal renal arteries and normal aorta and iliac segments without significant stenosis. I then crossed the aortic bifurcation and advanced to the left femoral head and then into the proximal to mid SFA to help opacify distally for full evaluation. Selective left lower extremity angiogram was then performed. This demonstrated widely patent arteries with no significant stenosis  identified. Her peroneal artery was small but her anterior tibial and posterior tibial arteries were large and continuous into the foot consistent with two-vessel runoff. I then performed imaging through the right femoral sheath to evaluate the right lower extremity. Again, no significant stenosis was identified in the right lower extremity with three-vessel runoff seen to the right foot. There was no role for arterial intervention with no significant arterial insufficiency identified. I elected to terminate the procedure. The sheath was removed and StarClose closure device was deployed in the left femoral artery with excellent hemostatic result. The patient was taken to the recovery room in stable condition having tolerated the procedure well.  Findings:  Aortogram: Aorta and iliac arteries without significant stenosis. Renal arteries patent. Bilateral Lower Extremities: no significant stenosis.  Two vessel runoff on the left leg, three vessel runoff on the right leg   Disposition: Patient was taken to the recovery room in stable condition having tolerated the procedure well.  Complications: None  DEW,JASON 05/09/2015 3:45 PM

## 2015-05-09 NOTE — Clinical Documentation Improvement (Signed)
Internal Medicine  Abnormal Lab/Test Results:  05/08/15: Na= 130  Possible Clinical Conditions associated with below indicators  Hyponatremia  Other Condition  Cannot Clinically Determine  Treatment Provided: IVF's   Please exercise your independent, professional judgment when responding. A specific answer is not anticipated or expected.   Thank You,  Rolm Gala, RN, [place] [phone]

## 2015-05-09 NOTE — Progress Notes (Signed)
Lab drew numerous tubes of blood equalling about 50-60 mls.  Patient is NPO.  Requested increase influids for volume and due to blood loss.  NS @ 75 added by Dr. Vianne Bulls.

## 2015-05-09 NOTE — Progress Notes (Signed)
ANTIBIOTIC CONSULT NOTE - FOLLOW  Pharmacy Consult for Vancomycin and Aztreonam Indication: Bilateral nonhealing ulcers  Allergies  Allergen Reactions  . Dabigatran Etexilate Mesylate Shortness Of Breath  . Coumadin [Warfarin Sodium] Other (See Comments)    hematoma  . Doxycycline Hyclate [Doxycycline] Itching  . Penicillins     Per pt anaphylactic reaction, years ago at age 81 (in college)  . Pradaxa [Dabigatran Etexilate Mesylate] Other (See Comments)    SLEEP  . Sulfonamide Derivatives   . Tape Other (See Comments)    blisters  . Tramadol Hcl     Itching     Patient Measurements: Height: 5' 5.5" (166.4 cm) Weight: 111 lb (50.349 kg) IBW/kg (Calculated) : 58.15   Vital Signs: Temp: 98.9 F (37.2 C) (11/04 0427) Temp Source: Oral (11/04 0427) BP: 118/52 mmHg (11/04 0427) Pulse Rate: 80 (11/04 0427) Intake/Output from previous day: 11/03 0701 - 11/04 0700 In: 200 [IV Piggyback:200] Out: 100 [Urine:100] Intake/Output from this shift:    Labs:  Recent Labs  05/08/15 1326 05/08/15 1437  WBC  --  16.3*  HGB  --  8.4*  PLT  --  565*  CREATININE 1.31*  --    Estimated Creatinine Clearance: 24.5 mL/min (by C-G formula based on Cr of 1.31). No results for input(s): VANCOTROUGH, VANCOPEAK, VANCORANDOM, GENTTROUGH, GENTPEAK, GENTRANDOM, TOBRATROUGH, TOBRAPEAK, TOBRARND, AMIKACINPEAK, AMIKACINTROU, AMIKACIN in the last 72 hours.   Microbiology: Recent Results (from the past 720 hour(s))  Wound culture     Status: None   Collection Time: 04/16/15 10:56 AM  Result Value Ref Range Status   Specimen Description ABSCESS  Final   Special Requests NONE  Final   Gram Stain   Final    RARE WBC SEEN MODERATE GRAM POSITIVE COCCI MODERATE GRAM POSITIVE RODS    Culture   Final    HEAVY GROWTH METHICILLIN RESISTANT STAPHYLOCOCCUS AUREUS HEAVY GROWTH COMAMONAS ACIDOVORANS CRITICAL RESULT CALLED TO, READ BACK BY AND VERIFIED WITH: RITA AFFUL AT 2202 04/23/15 DV    Report  Status 04/23/2015 FINAL  Final   Organism ID, Bacteria METHICILLIN RESISTANT STAPHYLOCOCCUS AUREUS  Final   Organism ID, Bacteria COMAMONAS ACIDOVORANS  Final      Susceptibility   Comamonas acidovorans - MIC*    CEFTRIAXONE 2 SENSITIVE Sensitive     CIPROFLOXACIN <=0.25 SENSITIVE Sensitive     GENTAMICIN >=16 RESISTANT Resistant     IMIPENEM <=0.25 SENSITIVE Sensitive     TRIMETH/SULFA <=20 SENSITIVE Sensitive     * HEAVY GROWTH COMAMONAS ACIDOVORANS   Methicillin resistant staphylococcus aureus - MIC*    CIPROFLOXACIN <=0.5 SENSITIVE Sensitive     GENTAMICIN <=0.5 SENSITIVE Sensitive     OXACILLIN >=4 RESISTANT Resistant     VANCOMYCIN 1 SENSITIVE Sensitive     TRIMETH/SULFA <=10 SENSITIVE Sensitive     CEFOXITIN SCREEN Value in next row Resistant      POSITIVECEFOXITIN SCREEN - This test may be used to predict mecA-mediated oxacillin resistance, and it is based on the cefoxitin disk screen test.  The cefoxitin screen and oxacillin work in combination to determine the final interpretation reported for oxacillin.     Inducible Clindamycin Value in next row Sensitive      POSITIVECEFOXITIN SCREEN - This test may be used to predict mecA-mediated oxacillin resistance, and it is based on the cefoxitin disk screen test.  The cefoxitin screen and oxacillin work in combination to determine the final interpretation reported for oxacillin.     TETRACYCLINE Value in next  row Sensitive      SENSITIVE<=1    * HEAVY GROWTH METHICILLIN RESISTANT STAPHYLOCOCCUS AUREUS  Blood culture (routine x 2)     Status: None (Preliminary result)   Collection Time: 05/08/15  1:26 PM  Result Value Ref Range Status   Specimen Description BLOOD LEFT ASSIST CONTROL  Final   Special Requests BOTTLES DRAWN AEROBIC AND ANAEROBIC 6CC  Final   Culture NO GROWTH < 24 HOURS  Final   Report Status PENDING  Incomplete  Blood culture (routine x 2)     Status: None (Preliminary result)   Collection Time: 05/08/15  2:21 PM   Result Value Ref Range Status   Specimen Description BLOOD RIGHT ASSIST CONTROL  Final   Special Requests BOTTLES DRAWN AEROBIC AND ANAEROBIC 5CC  Final   Culture NO GROWTH < 24 HOURS  Final   Report Status PENDING  Incomplete    Medical History: Past Medical History  Diagnosis Date  . Arthritis   . Hyperlipidemia   . Hypothyroidism   . Atrial fibrillation ([place])     a. s/p prior DCCV's, now persistent-->Xarelto (CHA2DS2VASc = 3).  . Diverticulosis     a. by colonoscopy, h/o colon adenoma ([facility])  . Gastritis and duodenitis     a. 01/2014 EGD: Moderate non-erosive gastritis and mild non-erosive duodenitis.    Assessment: 81 yo female here with bilateral necrotizing, nonhealing ulcers. Pt received a dose of vancomycin 750 mg IV x1 in the ED at 1443.   Ke 0.025, half life 27.7 h, Vd 35.2 L BCx x2 sent  Goal of Therapy:  Vancomycin trough level 15-20 mcg/ml  Plan:  Will order Vancomycin 750 mg IV q36h to start 15 h after initial dose for stacked dosing. Vanc trough before third dose - 10/7 at 0530 Will need to continue to follow renal function - anticipate renal function will improve, which may require change in dose.  Will need to continue to follow culture results.   Will order aztreonam 500 mg IV q8h.   Pharmacy will continue to follow.   Anastacio Bua D 05/09/2015,8:56 AM

## 2015-05-09 NOTE — NC FL2 (Signed)
[place] LEVEL OF CARE SCREENING TOOL     IDENTIFICATION  Patient Name: [name] Birthdate: [birth year] Sex: female Admission Date (Current Location): 05/08/2015  [place] and [place] Number:     Facility and Address:  [facility], [address], [place], [place] [address]      Provider Number: [id number]  Attending Physician Name and Address:  Epifanio Lesches, MD  Relative Name and Phone Number:       Current Level of Care: Hospital Recommended Level of Care: [place] Prior Approval Number:    Date Approved/Denied:   PASRR Number: [id number]  Discharge Plan: SNF    Current Diagnoses: Patient Active Problem List   Diagnosis Date Noted  . Bilateral leg ulcer ([place]) 05/08/2015  . Venous stasis ulcer of right lower extremity ([place]) 03/31/2015  . Abrasion of head 12/04/2014  . Gastric AVM 11/25/2014  . Mild malnutrition ([place]) 10/11/2014  . COPD (chronic obstructive pulmonary disease) with emphysema ([place]) 10/11/2014  . Chronic kidney disease, stage III (moderate) 10/11/2014  . Rheumatoid arthritis ([place]) 07/29/2014  . Stress due to illness of family member 01/11/2014  . Iron deficiency anemia due to chronic blood loss 01/11/2014  . Gastritis and duodenitis 01/11/2014  . Chronic venous insufficiency 02/02/2013  . Routine general medical examination at a health care facility 05/15/2012  . Other and unspecified hyperlipidemia 09/30/2010  . Osteoarthrosis involving more than one site 09/30/2010  . UNSPECIFIED HYPOTHYROIDISM 04/28/2009  . Atrial fibrillation ([place]) 04/28/2009    Orientation ACTIVITIES/SOCIAL BLADDER RESPIRATION    Self, Time, Situation, Place  Active Continent Normal  BEHAVIORAL SYMPTOMS/MOOD NEUROLOGICAL BOWEL NUTRITION STATUS   (none)  (none) Continent Diet  PHYSICIAN VISITS COMMUNICATION OF NEEDS Height & Weight Skin  30 days Verbally   111 lbs.  (venous stasis ulcers on lower  extremities)          AMBULATORY STATUS RESPIRATION    Assist extensive Normal      Personal Care Assistance Level of Assistance  Bathing, Feeding, Dressing Bathing Assistance: Limited assistance Feeding assistance: Independent Dressing Assistance: Limited assistance      Functional Limitations [place]  PT (By licensed PT)                   Additional Factors Info  Code Status Code Status Info: full  MRSA isolation             Current Medications (05/09/2015): Current Facility-Administered Medications  Medication Dose Route Frequency Provider Last Rate Last Dose  . 0.9 %  sodium chloride infusion   Intravenous Continuous Algernon Huxley, MD      . 0.9 %  sodium chloride infusion   Intravenous Continuous Epifanio Lesches, MD 75 mL/hr at 05/09/15 1403    . acetaminophen (TYLENOL) tablet 650 mg  650 mg Oral Q6H PRN Fritzi Mandes, MD       Or  . acetaminophen (TYLENOL) suppository 650 mg  650 mg Rectal Q6H PRN Fritzi Mandes, MD      . atenolol (TENORMIN) tablet 25 mg  25 mg Oral Daily Fritzi Mandes, MD   25 mg at 05/09/15 0851  . aztreonam (AZACTAM) 500 mg in dextrose 5 % 50 mL IVPB  500 mg Intravenous 3 times per day Fritzi Mandes, MD   500 mg at 05/09/15 1405  . calcium carbonate (TUMS - dosed in mg elemental  calcium) chewable tablet 1,250 mg  1,250 mg Oral Daily Fritzi Mandes, MD   1,250 mg at 05/09/15 0849  . docusate sodium (COLACE) capsule 100 mg  100 mg Oral BID Fritzi Mandes, MD   100 mg at 05/09/15 0851  . fentaNYL (SUBLIMAZE) injection    PRN Algernon Huxley, MD   50 mcg at 05/09/15 1520  . ferrous sulfate tablet 325 mg  325 mg Oral Daily Fritzi Mandes, MD   325 mg at 05/09/15 0852  . folic acid (FOLVITE) tablet 2 mg  2 mg Oral Daily Fritzi Mandes, MD   2 mg at 05/09/15 0850  . furosemide (LASIX) tablet 40 mg  40 mg Oral Daily Fritzi Mandes, MD   40 mg at 05/09/15 0851  . hydroxychloroquine (PLAQUENIL) tablet 400 mg  400 mg Oral QHS Fritzi Mandes, MD   400 mg at 05/08/15 2154  . [START ON 05/10/2015] levothyroxine (SYNTHROID, LEVOTHROID) tablet 75 mcg  75 mcg Oral QAC breakfast Fritzi Mandes, MD      . lidocaine-EPINEPHrine (XYLOCAINE-EPINEPHrine) 1 %-1:200000 (PF) injection    PRN Algernon Huxley, MD   10 mL at 05/09/15 1526  . magnesium hydroxide (MILK OF MAGNESIA) suspension 15 mL  15 mL Oral Daily PRN Fritzi Mandes, MD   15 mL at 05/08/15 1813  . midazolam (VERSED) injection    PRN Algernon Huxley, MD   2 mg at 05/09/15 1520  . ondansetron (ZOFRAN) tablet 4 mg  4 mg Oral Q6H PRN Fritzi Mandes, MD       Or  . ondansetron (ZOFRAN) injection 4 mg  4 mg Intravenous Q6H PRN Fritzi Mandes, MD      . oxyCODONE (Oxy IR/ROXICODONE) immediate release tablet 15 mg  15 mg Oral QID PRN Fritzi Mandes, MD   15 mg at 05/08/15 1813  . oxyCODONE (OXYCONTIN) 12 hr tablet 10 mg  10 mg Oral Q12H Fritzi Mandes, MD   10 mg at 05/09/15 0854  . polyethylene glycol (MIRALAX / GLYCOLAX) packet 17 g  17 g Oral Daily PRN Fritzi Mandes, MD   17 g at 05/09/15 4401  . vancomycin (VANCOCIN) IVPB 750 mg/150 ml premix  750 mg Intravenous Q36H Fritzi Mandes, MD   750 mg at 05/09/15 0619  . vitamin B-12 (CYANOCOBALAMIN) tablet 2,000 mcg  2,000 mcg Oral Daily Fritzi Mandes, MD   2,000 mcg at 05/09/15 0272   Do not use this list as official medication orders. Please verify with discharge summary.  Discharge Medications:   Medication List    ASK your doctor about these medications        acetaminophen 650 MG CR tablet  Commonly known as:  TYLENOL  Take 650 mg by mouth every 8 (eight) hours as needed for pain.     acyclovir ointment 5 %  Commonly known as:  ZOVIRAX  Apply 1 application topically every 2 (two) hours as needed.     ALGICELL CALCIUM DRESSING 4"X4 Misc  Apply 1 each topically daily.     apixaban 2.5 MG Tabs tablet  Commonly known as:  ELIQUIS  Take 1 tablet (2.5 mg total) by mouth 2 (two) times daily.     atenolol 25 MG tablet  Commonly known as:  TENORMIN  TAKE ONE TABLET  BY MOUTH ONCE DAILY     Biotin 5000 MCG Caps  Take 5,000 mcg by mouth daily.     CALCIUM CARBONATE PO  Take 1,200 mg by mouth daily.  CHONDROITIN SULFATE PO  Take 1,200 mg by mouth daily.     cyanocobalamin 2000 MCG tablet  Take 2,000 mcg by mouth daily.     docusate sodium 100 MG capsule  Commonly known as:  COLACE  Take 100 mg by mouth 2 (two) times daily.     folic acid 1 MG tablet  Commonly known as:  FOLVITE  Take 2 mg by mouth daily.     furosemide 40 MG tablet  Commonly known as:  LASIX  TAKE ONE TABLET BY MOUTH ONCE DAILY AS NEEDED     GLUCOSAMINE PO  Take 1,500 mg by mouth daily.     hydroxychloroquine 200 MG tablet  Commonly known as:  PLAQUENIL  Take 400 mg by mouth at bedtime.     Iron 325 (65 FE) MG Tabs  Take 325 mg by mouth daily.     levothyroxine 75 MCG tablet  Commonly known as:  SYNTHROID, LEVOTHROID  TAKE ONE TABLET BY MOUTH ONCE DAILY BEFORE BREAKFAST     magnesium hydroxide 400 MG/5ML suspension  Commonly known as:  MILK OF MAGNESIA  Take 15 mLs by mouth daily as needed for mild constipation or moderate constipation.     methotrexate 2.5 MG tablet  Commonly known as:  RHEUMATREX  Take 12.5 mg by mouth once a week. On Wednesday     omeprazole 40 MG capsule  Commonly known as:  PRILOSEC  Take 40 mg by mouth daily.     ONE-A-DAY 50 PLUS PO  Take 1 tablet by mouth daily.     oxyCODONE 15 MG immediate release tablet  Commonly known as:  ROXICODONE  Take 15 mg by mouth 4 (four) times daily as needed for pain.     oxyCODONE 10 mg 12 hr tablet  Commonly known as:  OXYCONTIN  Take 10 mg by mouth every 12 (twelve) hours.     polyethylene glycol packet  Commonly known as:  MIRALAX / GLYCOLAX  Take 17 g by mouth daily as needed for mild constipation, moderate constipation or severe constipation.        Relevant Imaging Results:  Relevant Lab Results:  Recent Labs    Additional Information SS: [id number]  Shela Leff,  LCSW

## 2015-05-09 NOTE — Progress Notes (Signed)
Initial Nutrition Assessment   INTERVENTION:   Meals and Snacks: Cater to patient preferences Medical Food Supplement Therapy: recommend Ensure Enlive po BID, each supplement provides 350 kcal and 20 grams of protein   NUTRITION DIAGNOSIS:   Increased nutrient needs related to wound healing as evidenced by estimated needs.  GOAL:   Patient will meet greater than or equal to 90% of their needs  MONITOR:    (Energy Intake, Electrolyte and renal Profile, Skin)  REASON FOR ASSESSMENT:   Malnutrition Screening Tool    ASSESSMENT:   Pt admitted with necrotizing b/l leg ulcers. Pt on isolation for MRSA. Pt out of room on visit for angiogram of both legs with vascular this afternoon.  Past Medical History  Diagnosis Date  . Arthritis   . Hyperlipidemia   . Hypothyroidism   . Atrial fibrillation ([place])     a. s/p prior DCCV's, now persistent-->Xarelto (CHA2DS2VASc = 3).  . Diverticulosis     a. by colonoscopy, h/o colon adenoma ([place])  . Gastritis and duodenitis     a. 01/2014 EGD: Moderate non-erosive gastritis and mild non-erosive duodenitis.     Diet Order:  Diet regular Room service appropriate?: Yes; Fluid consistency:: Thin    Current Nutrition: Pt NPO today for procedure  Food/Nutrition-Related History: Per MST decreased appetite PTA   Scheduled Medications:  . atenolol  25 mg Oral Daily  . aztreonam  500 mg Intravenous 3 times per day  . calcium carbonate  1,250 mg Oral Daily  . docusate sodium  100 mg Oral BID  . ferrous sulfate  325 mg Oral Daily  . folic acid  2 mg Oral Daily  . furosemide  40 mg Oral Daily  . hydroxychloroquine  400 mg Oral QHS  . [START ON 05/10/2015] levothyroxine  75 mcg Oral QAC breakfast  . oxyCODONE  10 mg Oral Q12H  . vancomycin  750 mg Intravenous Q36H  . cyanocobalamin  2,000 mcg Oral Daily    Continuous Medications:  . sodium chloride    . sodium chloride 75 mL/hr at 05/09/15 1403     Electrolyte/Renal Profile and  Glucose Profile:   Recent Labs Lab 05/08/15 1326  NA 130*  K 4.6  CL 94*  CO2 25  BUN 31*  CREATININE 1.31*  CALCIUM 8.7*  GLUCOSE 105*   Protein Profile: No results for input(s): ALBUMIN in the last 168 hours.  Gastrointestinal Profile: Last BM:  05/09/2015   Nutrition-Focused Physical Exam Findings:  Unable to complete Nutrition-Focused physical exam at this time.    Weight Change: Per [facility] pt weight relatively stable since April 2016.   Skin:   necrotizing b/l leg ulcers, vascular following   Height:   Ht Readings from Last 1 Encounters:  05/08/15 5' 5.5" (1.664 m)    Weight:   Wt Readings from Last 1 Encounters:  05/08/15 111 lb (50.349 kg)    Wt Readings from Last 10 Encounters:  05/08/15 111 lb (50.349 kg)  04/15/15 117 lb (53.071 kg)  04/11/15 114 lb (51.71 kg)  03/31/15 113 lb (51.256 kg)  02/02/15 115 lb (52.164 kg)  12/26/14 114 lb (51.71 kg)  12/04/14 117 lb (53.071 kg)  11/25/14 121 lb (54.885 kg)  10/11/14 111 lb 8 oz (50.576 kg)  10/09/14 113 lb (51.256 kg)     BMI:  Body mass index is 18.18 kg/(m^2).  Estimated Nutritional Needs:   Kcal:  BEE: 941kcals, TEE: (IF 1.1-1.3)(AF 1.2) 1242-1647kcals  Protein:  60-75g protein (1.2-1.5g/kg)  Fluid:  1250-15100mL of fluid (25-60mL/kg)  EDUCATION NEEDS:   No education needs identified at this time   [place], [place], LDN Pager [phone]

## 2015-05-09 NOTE — Clinical Social Work Note (Signed)
Clinical Social Work Assessment  Patient Details  Name: [name] MRN: [id number] Date of Birth: 03-07-1927  Date of referral:  05/09/15               Reason for consult:  Facility Placement                Permission sought to share information with:  Family Supports, Chartered certified accountant granted to share information::  Yes, Verbal Permission Granted  Name::        Agency::     Relationship::     Contact Information:     Housing/Transportation Living arrangements for the past 2 months:  Single Family Home Source of Information:  Spouse Patient Interpreter Needed:  None Criminal Activity/Legal Involvement Pertinent to Current Situation/Hospitalization:  No - Comment as needed Significant Relationships:  Spouse Lives with:  Spouse Do you feel safe going back to the place where you live?  Yes Need for family participation in patient care:  Yes (Comment)  Care giving concerns: Patient resides with her husband at home.   Social Worker assessment / plan:  CSW received consult to speak with patient regarding the possibility of rehab at discharge.  PT has not yet been ordered due to patient having a procedure today (an angiogram). Patient also has wounds on her legs. CSW attempted to see patient this afternoon but she was already down for her angiogram. CSW met with patient's husband: [name] ([phone]) in patient's hospital room. Patient's nurse informed CSW that patient had already mentioned to her that she would want to go to STR at [facility] if it was necessary. Patient's husband shared that patient had quickly gone downhill over the past few weeks with her legs and the wounds and her mobility. Patient's husband reports that typically she does not need an assistive device to ambulate but that lately she has been using a cane at home. Patient's husband states that both he and patient have been to [facility] for rehab in the past and that he believes she  will want [facility] but that we can follow up with her regarding her preference. Bedsearch initiated.  Employment status:  Retired Forensic scientist:  Medicare PT Recommendations:  Not assessed at this time Information / Referral to community resources:     Patient/Family's Response to care:  Patient's husband expressed appreciation for CSW assistance.   Patient/Family's Understanding of and Emotional Response to Diagnosis, Current Treatment, and Prognosis:  Patient's husband is concerned about the rapid decline of patient's wounds and mobility and is hoping that the hospital can assist with getting her better. CSW provided emotional support.   Emotional Assessment Appearance:   (patient off floor at time of visit with husband) Attitude/Demeanor/Rapport:  Unable to Assess Affect (typically observed):    Orientation:  Oriented to Self, Oriented to Place, Oriented to  Time, Oriented to Situation (dcoumentation reports patient is oriented X4) Alcohol / Substance use:  Not Applicable Psych involvement (Current and /or in the community):  No (Comment)  Discharge Needs  Concerns to be addressed:  Care Coordination Readmission within the last 30 days:  No Current discharge risk:  None Barriers to Discharge:  No Barriers Identified   Shela Leff, LCSW 05/09/2015, 3:09 PM

## 2015-05-09 NOTE — Progress Notes (Signed)
Gave belonging (jewelry and brown small purse to husband) to take home.  Husband Clair Gulling agreed to take with him.

## 2015-05-09 NOTE — Progress Notes (Signed)
[place] [facility] [place] NAME: [name]    MR#:  [id number]  DATE OF BIRTH:  02/20/1927  SUBJECTIVE: 81 year old female patient with chronic atrial fibrillation on eliquis, rheumatoid  arthritis on methotrexate, Plaquenil, history of GI bleed with av malformations comes to the emergency room secondary to bilateral lower extremity necrotic ulcers.. Patient is followed by wound care, was sent to rheumatology office yesterday for emergency evaluation. Dr. Myna Hidalgo send her to the emergency room for evaluation. noted pain in both legs 3 weeks ago , developed ulcers which continued to enlarge. Patient was seen by wound care started on topical. Noted to have very painful ulcers.in Both legs Did not have any blisters.   CHIEF COMPLAINT:   Chief Complaint  Patient presents with  . Wound Infection    REVIEW OF SYSTEMS:    Review of Systems  Constitutional: Negative for fever and chills.  HENT: Negative for hearing loss.   Eyes: Negative for blurred vision, double vision and photophobia.  Respiratory: Negative for cough, hemoptysis and shortness of breath.   Cardiovascular: Negative for palpitations, orthopnea and leg swelling.  Gastrointestinal: Negative for vomiting, abdominal pain and diarrhea.  Genitourinary: Negative for dysuria and urgency.  Musculoskeletal: Negative for myalgias and neck pain.       Bilateral legs black necrotic ulcers with eschar, black discoloration of both legs.  Skin: Negative for rash.  Neurological: Negative for dizziness, focal weakness, seizures, weakness and headaches.  Psychiatric/Behavioral: Negative for memory loss. The patient does not have insomnia.     Nutrition:  Tolerating Diet: Tolerating PT:      DRUG ALLERGIES:   Allergies  Allergen Reactions  . Dabigatran Etexilate Mesylate Shortness Of Breath  . Coumadin [Warfarin Sodium] Other (See Comments)    hematoma  . Doxycycline Hyclate  [Doxycycline] Itching  . Penicillins     Per pt anaphylactic reaction, years ago at age 47 (in college)  . Pradaxa [Dabigatran Etexilate Mesylate] Other (See Comments)    SLEEP  . Sulfonamide Derivatives   . Tape Other (See Comments)    blisters  . Tramadol Hcl     Itching     VITALS:  Blood pressure 118/52, pulse 80, temperature 98.9 F (37.2 C), temperature source Oral, resp. rate 20, height 5' 5.5" (1.664 m), weight 50.349 kg (111 lb), SpO2 95 %.  PHYSICAL EXAMINATION:   Physical Exam  GENERAL:  81 y.o.-year-old patient lying in the bed with no acute distress.  EYES: Pupils equal, round, reactive to light and accommodation. No scleral icterus. Extraocular muscles intact.  HEENT: Head atraumatic, normocephalic. Oropharynx and nasopharynx clear.  NECK:  Supple, no jugular venous distention. No thyroid enlargement, no tenderness.  LUNGS: Normal breath sounds bilaterally, no wheezing, rales,rhonchi or crepitation. No use of accessory muscles of respiration.  CARDIOVASCULAR: S1, S2 normal. No murmurs, rubs, or gallops.  ABDOMEN: Soft, nontender, nondistended. Bowel sounds present. No organomegaly or mass.  EXTREMITIES: noted to have large ulcerating necrotic lesions in both legs, diffuse ecchymotic, NEUROLOGIC: Cranial nerves II through XII are intact. Muscle strength 5/5 in all extremities. Sensation intact. Gait not checked.  PSYCHIATRIC: The patient is alert and oriented x 3.  SKIN: No obvious rash, lesion, or ulcer.    LABORATORY PANEL:   CBC  Recent Labs Lab 05/08/15 1437  WBC 16.3*  HGB 8.4*  HCT 26.3*  PLT 565*   ------------------------------------------------------------------------------------------------------------------  Chemistries   Recent Labs Lab 05/08/15 1326  NA 130*  K  4.6  CL 94*  CO2 25  GLUCOSE 105*  BUN 31*  CREATININE 1.31*  CALCIUM 8.7*    ------------------------------------------------------------------------------------------------------------------  Cardiac Enzymes No results for input(s): TROPONINI in the last 168 hours. ------------------------------------------------------------------------------------------------------------------  RADIOLOGY:  No results found.   ASSESSMENT AND PLAN:   Active Problems:   Bilateral leg ulcer ([place])   #1 ;; progressively necrotizing painful ulcers in both legs; rule out infection, vasculitis, drug reaction. Patient is a right now on IV Azactam, vancomycin., consult ID, wound  cultures are showing gram-negative rods ,follow   full final results. *vascular surgery consulted for angiogram of both legs to evaluate for arterial insufficiency  2. History of rheumatoid arthritis: Seen by Dr. Jefm Bryant,, ordered the labs for  ANA, cryoglobulins, anticardiolipin antibodies, lupus anticoagulant, ANCA, rheumatoid factor. Consult surgery for biopsy to rule out vasculitis. Held  methotrexate due to infection risk. Continue Plaquenel.. #3. Chronic atrial fibrillation right now patient says that she is on Eliquis,for the past 1 year, patient is requesting to see Dr. Rockey Situ. Continue atenolol 25 mg daily. Restart Eliquis. #4 GERD continue PPIs #5. Hypothyroid;continue Synthyroid #6 history of AV malformations; continue PPIs. #7 acute renal failure with ATN ;continue IV hydration..    All the records are reviewed and case discussed with Care Management/Social Workerr. Management plans discussed with the patient, family and they are in agreement.  CODE STATUS: full  TOTAL TIME TAKING CARE OF THIS PATIENT: 35 minutes.   POSSIBLE D/C IN 1-2  DAYS, DEPENDING ON CLINICAL CONDITION.   Epifanio Lesches M.D on 05/09/2015 at 12:17 PM  Between 7am to 6pm - Pager - [phone]  After 6pm go to [email or web address] - password [email or web address]  Office   [phone]  CC: Primary care physician; Viviana Simpler, MD

## 2015-05-09 NOTE — Consult Note (Signed)
Meda Klinefelter., MD - 05/08/2015 11:15 AM EDT Formatting of this note may be different from the original. [facility] Up   MRN: [id number]  Chief Complaint:  Chief Complaint  Patient presents with  . was seen in at the wound center for ankles  referred back to see [place] - need to know if we need to change anything with medications.   HPI Acute office visit. Seen emergently at request of wound clinic History of rheumatoid arthritis. On methotrexate. Reasonably controlled. No nodules. History of GI bleed in AVMs. Chronic anti coagulation because of atrial fibrillation. Mild renal insufficiency. Three weeks ago she developed pain in both lower extremities. Developed ulcerations which have continued to enlarge. She has seen primary physician as well as wound clinic. She has been on topicals. Had home health address her wounds last night. Very painful. She has had prior antibiotics including antibiotics for MRSA. No blistering. She has not had any lesions in her hands. By her report she had normal venous studies. She has had good pulses by her report. Aches all over. Generally does not feel well. There has been no fever. No new shortness of breath. Some mild confusion  Past Medical History  Diagnosis Date  . Arthritis  . Atrial fibrillation  . Chronic venous insufficiency  . Hematoma of leg  . Hyperlipemia  . Hypertension  . Hypothyroid  . IDA (iron deficiency anemia)  . Rheumatoid arthritis  Plaquenil. Methotrexate.   Past Surgical History  Procedure Laterality Date  . Arthroscopic rotator cuff repair  . Eye surgery  . Knee surgery  . Tonsillectomy   Medications:  Current Outpatient Prescriptions  Medication Sig Dispense Refill  . acetaminophen (TYLENOL) 650 MG ER tablet Take 650 mg by mouth every 8 (eight) hours as needed for Pain.  Marland Kitchen apixaban (ELIQUIS) 2.5 mg tablet Take 2.5 mg by mouth 2 (two) times daily with breakfast and lunch.  Marland Kitchen atenolol  (TENORMIN) 25 MG tablet Take 25 mg by mouth once daily.  Marland Kitchen docusate (COLACE) 100 MG capsule Take by mouth.  . doxycycline (VIBRAMYCIN) 100 MG capsule Take 1 capsule (100 mg total) by mouth 2 (two) times daily. 14 capsule 0  . folic acid (FOLVITE) 1 MG tablet Take 2 tablets (2 mg total) by mouth once daily. 60 tablet 11  . FUROsemide (LASIX) 40 MG tablet Take 40 mg by mouth once daily.  . hydroxychloroquine (PLAQUENIL) 200 mg tablet TAKE TWO TABLETS BY MOUTH ONCE DAILY 60 tablet 3  . levothyroxine (SYNTHROID, LEVOTHROID) 75 MCG tablet Take 75 mcg by mouth once daily. Take on an empty stomach with a glass of water at least 30-60 minutes before breakfast.  . methocarbamol (ROBAXIN) 500 MG tablet Take 500 mg by mouth as needed.  . methotrexate (RHEUMATREX) 2.5 MG tablet TAKE EIGHT TABLETS BY MOUTH ONCE A WEEK (Patient taking differently: TAKE 5 TABLETS BY MOUTH ONCE A WEEK) 32 tablet 3  . mupirocin (BACTROBAN) 2 % ointment Apply topically 3 (three) times daily. 22 g 0  . omeprazole (PRILOSEC) 40 MG DR capsule Take 40 mg by mouth once daily.  Marland Kitchen oxyCODONE (ROXICODONE) 15 MG immediate release tablet  . triamcinolone 0.1 % cream Apply topically 2 (two) times daily.   No current facility-administered medications for this visit.   Allergies: Dabigatran etexilate mesylate; Coumadin [warfarin]; Doxycycline hyclate; Other; Penicillamine; Sulfa (sulfonamide antibiotics); and Tramadol  Physical Exam   Visit Vitals  . BP 102/58 (BP Location: Right upper arm, Patient Position:  Sitting, BP Cuff Size: Small Adult)  . Temp 36.9 C (98.4 F) (Oral)   Pleasant female. In a wheelchair. Needs help to ambulate to the b.i.d.. Sclerae clear. Clear pharynx. Clear chest. Irregular rhythm. No significant prominent murmur. No visceromegaly. Distal pulses appreciated. Legs are heard diffusely ecchymotic. Large ulcerating necrotic ulcers both legs. Extensive several cm. Continued contiguous. There no vasculitic changes of  the fingers or nails or the toes. Neck moves well. Shoulders move well. No significant wrist or MCP synovitis. Hypertrophic change DIPs PIP. No definite knee effusions. Difficulty to ambulate without assistance because of pain in the legs  Assessment: Progressive necrotizing painful ulcers of the lower extremities. Rule out infectious, vasculitis(not typical setting for rheumatoid vasculitis), ischemic, drug reaction (does eliquis cause skin necrosis?) Rheumatoid arthritis reasonably well controlled on methotrexate atrial fibrillation on chronic anti coagulation History of GI bleed. History of AVMs. Iron deficiency anemia Mild confusion. New. New renal insufficiency  Plan:  Recommend surgical consult for wound treatment but also for biopsy to rule out vasculitis.  Recommend ID consult. ?hyperbaric O2 Recommend rechecking serologies. Rheumatoid factor, ANA, ANCA reflex, cryoglobulins,esr, anticardiolipin antibodies, lupus anticoagulant Urinalysis Consider stopping anti coagulation Analgesia Hold Methoterxate given infection risk  Portions of this note were created with voice recognition software. I apologize for any typographical errors which were not recognized and corrected

## 2015-05-09 NOTE — Consult Note (Signed)
CARDIOLOGY CONSULT NOTE  Patient ID: [name] MRN: [id number] DOB/AGE: 79-30-1928 79 y.o.  Admit date: 05/08/2015  Primary Cardiologist : Dr. Rockey Situ Reason for Consultation : Is the skin necrosis due to Eliquis ?  HPI:  This is an 81 year old female with history of chronic atrial fibrillation, rheumatoid arthritis, hypothyroidism and diastolic heart failure. She presented with extensive bilateral leg ulceration and necrosis of unclear etiology. This has been worsening over the last 6 weeks in spite of wound care. The patient has been on Eliquis for chronic atrial fibrillation. This is not a new medication. She denies any chest pain or shortness of breath. She underwent lower extremity angiogram which showed no significant peripheral arterial disease.  Review of systems complete and found to be negative unless listed above   Past Medical History  Diagnosis Date  . Arthritis   . Hyperlipidemia   . Hypothyroidism   . Atrial fibrillation ([place])     a. s/p prior DCCV's, now persistent-->Xarelto (CHA2DS2VASc = 3).  . Diverticulosis     a. by colonoscopy, h/o colon adenoma ([place])  . Gastritis and duodenitis     a. 01/2014 EGD: Moderate non-erosive gastritis and mild non-erosive duodenitis.    Family History  Problem Relation Age of Onset  . Heart disease Mother     Social History   Social History  . Marital Status: Married    Spouse Name: N/A  . Number of Children: N/A  . Years of Education: N/A   Occupational History  . retired    Social History Main Topics  . Smoking status: Former Smoker    Types: Cigarettes  . Smokeless tobacco: Never Used  . Alcohol Use: Yes  . Drug Use: No  . Sexual Activity: Not on file   Other Topics Concern  . Not on file   Social History Narrative   Regular exercise   Remains active with church and community activities      Has living will   Husband, then Barton Fanny (friend), is health care POA   Has DNR order---form done  again 05/15/12   No tube feeds if cognitively unaware    Past Surgical History  Procedure Laterality Date  . Rotator cuff repair    . Intraocular lens implant, secondary  1996/ 2010  . Knee surgery      lateral  . Tonsillectomy    . Colonoscopy  02/2011    mod diverticulosis ([place])  . Total knee arthroplasty Bilateral      Prescriptions prior to admission  Medication Sig Dispense Refill Last Dose  . acetaminophen (TYLENOL) 650 MG CR tablet Take 650 mg by mouth every 8 (eight) hours as needed for pain.   PRN  . acyclovir ointment (ZOVIRAX) 5 % Apply 1 application topically every 2 (two) hours as needed. (Patient taking differently: Apply 1 application topically every 2 (two) hours as needed (for infection). ) 30 g 2 PRN  . apixaban (ELIQUIS) 2.5 MG TABS tablet Take 1 tablet (2.5 mg total) by mouth 2 (two) times daily. 60 tablet 3 05/08/2015 at am  . atenolol (TENORMIN) 25 MG tablet TAKE ONE TABLET BY MOUTH ONCE DAILY 90 tablet 3 05/08/2015 at 0800  . Biotin 5000 MCG CAPS Take 5,000 mcg by mouth daily.    05/08/2015 at am  . Calcium Alginate (ALGICELL CALCIUM DRESSING 4"X4) MISC Apply 1 each topically daily. 10 each 2 Taking  . CALCIUM CARBONATE PO Take 1,200 mg by mouth daily.   05/08/2015 at am  .  CHONDROITIN SULFATE PO Take 1,200 mg by mouth daily.   05/08/2015 at am  . cyanocobalamin 2000 MCG tablet Take 2,000 mcg by mouth daily.   05/08/2015 at am  . docusate sodium (COLACE) 100 MG capsule Take 100 mg by mouth 2 (two) times daily.   05/08/2015 at am  . Ferrous Sulfate (IRON) 325 (65 FE) MG TABS Take 325 mg by mouth daily.    76/02/1156 at am  . folic acid (FOLVITE) 1 MG tablet Take 2 mg by mouth daily.    05/08/2015 at am  . furosemide (LASIX) 40 MG tablet TAKE ONE TABLET BY MOUTH ONCE DAILY AS NEEDED (Patient taking differently: TAKE ONE TABLET BY MOUTH ONCE DAILY AS NEEDED FOR SWELLING) 90 tablet 3 PRN  . Glucosamine HCl (GLUCOSAMINE PO) Take 1,500 mg by mouth daily.   05/08/2015 at am  .  hydroxychloroquine (PLAQUENIL) 200 MG tablet Take 400 mg by mouth at bedtime.    05/07/2015 at pm  . levothyroxine (SYNTHROID, LEVOTHROID) 75 MCG tablet TAKE ONE TABLET BY MOUTH ONCE DAILY BEFORE BREAKFAST 90 tablet 3 05/08/2015 at am  . magnesium hydroxide (MILK OF MAGNESIA) 400 MG/5ML suspension Take 15 mLs by mouth daily as needed for mild constipation or moderate constipation.    PRN  . methotrexate (RHEUMATREX) 2.5 MG tablet Take 12.5 mg by mouth once a week. On Wednesday   05/07/2015 at am  . Multiple Vitamins-Minerals (ONE-A-DAY 50 PLUS PO) Take 1 tablet by mouth daily.    05/08/2015 at am  . omeprazole (PRILOSEC) 40 MG capsule Take 40 mg by mouth daily.   05/08/2015 at am  . OxyCODONE (OXYCONTIN) 10 mg T12A 12 hr tablet Take 10 mg by mouth every 12 (twelve) hours.    05/08/2015 at am  . oxyCODONE (ROXICODONE) 15 MG immediate release tablet Take 15 mg by mouth 4 (four) times daily as needed for pain.   PRN  . polyethylene glycol (MIRALAX / GLYCOLAX) packet Take 17 g by mouth daily as needed for mild constipation, moderate constipation or severe constipation.   PRN    Physical Exam: Blood pressure 106/44, pulse 84, temperature 97.2 F (36.2 C), temperature source Oral, resp. rate 20, height 5' 5.5" (1.664 m), weight 111 lb (50.349 kg), SpO2 98 %.  Constitutional: She is oriented to person, place, and time. She appears well-developed and well-nourished. No distress.  HENT: No nasal discharge.  Head: Normocephalic and atraumatic.  Eyes: Pupils are equal and round. No discharge.  Neck: Normal range of motion. Neck supple. No JVD present. No thyromegaly present.  Cardiovascular: Normal rate, irregular rhythm, normal heart sounds. Exam reveals no gallop and no friction rub. No murmur heard.  Pulmonary/Chest: Effort normal and breath sounds normal. No stridor. No respiratory distress. She has no wheezes. She has no rales. She exhibits no tenderness.  Abdominal: Soft. Bowel sounds are normal. She  exhibits no distension. There is no tenderness. There is no rebound and no guarding.  Musculoskeletal: Normal range of motion. She exhibits no edema and no tenderness.  Neurological: She is alert and oriented to person, place, and time. Coordination normal.  Skin:  There is extensive necrotic tissue on bilateral shin area.  Psychiatric: She has a normal mood and affect. Her behavior is normal. Judgment and thought content normal.      Labs:   Lab Results  Component Value Date   WBC 16.3* 05/08/2015   HGB 8.4* 05/08/2015   HCT 26.3* 05/08/2015   MCV 88.4 05/08/2015  PLT 565* 05/08/2015    Recent Labs Lab 05/08/15 1326  NA 130*  K 4.6  CL 94*  CO2 25  BUN 31*  CREATININE 1.31*  CALCIUM 8.7*  GLUCOSE 105*   No results found for: CKTOTAL, CKMB, CKMBINDEX, TROPONINI     ASSESSMENT AND PLAN:    1. Bilateral necrotizing ulceration on both legs. The exact etiology is not entirely clear. Peripheral arterial disease has been excluded with an angiogram. Vasculitis or infectious etiology are possibilities.   I am not aware that Eliquis can cause skin necrosis like this especially that it is not a vitamin K inhibitor but a factor 10 inhibitor. The patient reports that this started as superficial bleeding that progressed to necrotic tissue. I think it is reasonable to hold anticoagulation for now especially that she might require debridement and biopsies. I think Eliquis can be resumed once no procedures or surgeries are planned.  2. Chronic atrial fibrillation: Ventricular rate is controlled.   Signed: Kathlyn Sacramento MD, [facility] 05/09/2015, 5:50 PM

## 2015-05-10 LAB — CBC
HEMATOCRIT: 22 % — AB (ref 35.0–47.0)
HEMOGLOBIN: 7.1 g/dL — AB (ref 12.0–16.0)
MCH: 28.2 pg (ref 26.0–34.0)
MCHC: 32.3 g/dL (ref 32.0–36.0)
MCV: 87.4 fL (ref 80.0–100.0)
Platelets: 459 10*3/uL — ABNORMAL HIGH (ref 150–440)
RBC: 2.52 MIL/uL — ABNORMAL LOW (ref 3.80–5.20)
RDW: 19 % — ABNORMAL HIGH (ref 11.5–14.5)
WBC: 15.3 10*3/uL — AB (ref 3.6–11.0)

## 2015-05-10 LAB — TSH: TSH: 5.774 u[IU]/mL — ABNORMAL HIGH (ref 0.350–4.500)

## 2015-05-10 LAB — BASIC METABOLIC PANEL
ANION GAP: 5 (ref 5–15)
BUN: 28 mg/dL — ABNORMAL HIGH (ref 6–20)
CO2: 26 mmol/L (ref 22–32)
Calcium: 7.7 mg/dL — ABNORMAL LOW (ref 8.9–10.3)
Chloride: 102 mmol/L (ref 101–111)
Creatinine, Ser: 0.96 mg/dL (ref 0.44–1.00)
GFR calc Af Amer: >60 mL/min (ref >60)
GFR, EST NON AFRICAN AMERICAN: 52 mL/min — AB (ref >60)
GLUCOSE: 105 mg/dL — AB (ref 65–99)
POTASSIUM: 4.4 mmol/L (ref 3.5–5.1)
SODIUM: 133 mmol/L — AB (ref 135–145)

## 2015-05-10 LAB — VITAMIN B12: VITAMIN B 12: 3524 pg/mL — AB (ref 180–914)

## 2015-05-10 LAB — LACTATE DEHYDROGENASE: LDH: 161 U/L (ref 98–192)

## 2015-05-10 LAB — FOLATE: Folate: 13.1 ng/mL (ref >5.9)

## 2015-05-10 MED ORDER — CIPROFLOXACIN IN D5W 400 MG/200ML IV SOLN
400.0000 mg | Freq: Two times a day (BID) | INTRAVENOUS | Status: DC
  Administered 2015-05-10 – 2015-05-13 (×6): 400 mg via INTRAVENOUS
  Filled 2015-05-10 (×8): qty 200

## 2015-05-10 MED ORDER — VANCOMYCIN HCL IN DEXTROSE 750-5 MG/150ML-% IV SOLN
750.0000 mg | INTRAVENOUS | Status: DC
  Filled 2015-05-10: qty 150

## 2015-05-10 MED ORDER — DEXTROSE 5 % IV SOLN
1.0000 g | Freq: Three times a day (TID) | INTRAVENOUS | Status: DC
  Filled 2015-05-10 (×2): qty 1

## 2015-05-10 NOTE — Progress Notes (Signed)
[facility] [place] NAME: [name]    MR#:  [id number]  DATE OF BIRTH:  02/24/1927  SUBJECTIVE:  CHIEF COMPLAINT:   Chief Complaint  Patient presents with  . Wound Infection   Reports that swelling in both legs is decreasing. Still has burning pain with any ambulation. Is able to move the ankle joints freely without pain.  REVIEW OF SYSTEMS:   Review of Systems  Constitutional: Negative for fever and chills.  Respiratory: Negative for shortness of breath.   Cardiovascular: Negative for chest pain and palpitations.  Gastrointestinal: Negative for nausea, vomiting and abdominal pain.  Genitourinary: Negative for dysuria.  Skin: Positive for rash.    DRUG ALLERGIES:   Allergies  Allergen Reactions  . Dabigatran Etexilate Mesylate Shortness Of Breath  . Coumadin [Warfarin Sodium] Other (See Comments)    hematoma  . Doxycycline Hyclate [Doxycycline] Itching  . Penicillins     Per pt anaphylactic reaction, years ago at age 50 (in college)  . Pradaxa [Dabigatran Etexilate Mesylate] Other (See Comments)    SLEEP  . Sulfonamide Derivatives   . Tape Other (See Comments)    blisters  . Tramadol Hcl     Itching     VITALS:  Blood pressure 104/41, pulse 78, temperature 98.3 F (36.8 C), temperature source Oral, resp. rate 18, height 5' 5.5" (1.664 m), weight 50.349 kg (111 lb), SpO2 96 %.  PHYSICAL EXAMINATION:  GENERAL:  81 y.o.-year-old patient sitting up eating breakfast with no acute distress.  LUNGS: Normal breath sounds bilaterally, no wheezing, rales,rhonchi or crepitation. No use of accessory muscles of respiration.  CARDIOVASCULAR: S1, S2 normal. No murmurs, rubs, or gallops.  ABDOMEN: Soft, nontender, nondistended. Bowel sounds present. No organomegaly or mass.  EXTREMITIES: No edema, bluish discoloration bilateral lower extremities, black eschar over anterior lower extremity ulcers, no drainage, no erythema   NEUROLOGIC: Cranial nerves II through XII are intact. Muscle strength 5/5 in all extremities. Sensation intact. PSYCHIATRIC: The patient is alert and oriented x 3.  SKIN: See above description of extremities   LABORATORY PANEL:   CBC  Recent Labs Lab 05/10/15 0511  WBC 15.3*  HGB 7.1*  HCT 22.0*  PLT 459*   ------------------------------------------------------------------------------------------------------------------  Chemistries   Recent Labs Lab 05/10/15 0313  NA 133*  K 4.4  CL 102  CO2 26  GLUCOSE 105*  BUN 28*  CREATININE 0.96  CALCIUM 7.7*   ------------------------------------------------------------------------------------------------------------------  Cardiac Enzymes No results for input(s): TROPONINI in the last 168 hours. ------------------------------------------------------------------------------------------------------------------  RADIOLOGY:  No results found.  EKG:   Orders placed or performed in visit on 07/29/14  . EKG 12-Lead    ASSESSMENT AND PLAN:  81 year old female patient with chronic atrial fibrillation on eliquis, rheumatoid arthritis on methotrexate, Plaquenil, history of GI bleed with av malformations comes to the emergency room secondary to bilateral lower extremity necrotic ulcers.. Patient is followed by wound care, was sent to rheumatology office yesterday for emergency evaluation. Dr. Myna Hidalgo send her to the emergency room for evaluation. noted pain in both legs 3 weeks ago , developed ulcers which continued to enlarge. Patient was seen by wound care started on topical. Noted to have very painful ulcers.in Both legs Did not have any blisters.    #1 bilateral lower extremity ulcerations and necrosis - Rheumatologic labs pending - Culture showing Pseudomonas sensitive to ciprofloxacin I will change antibiotics accordingly - Aortogram showing no significant stenoses of either leg - Eliquis not  known to cause ulceration or  tissue necrosis - I do not see that this area has been biopsied, this would potentially provide a diagnosis  #2 chronic atrial fibrillation - Appreciate cardiology consultation - Rate is controlled, continue atenolol and hold eliquis  #3 GERD and history of gastrointestinal AVM - Continue PPI  #4 hypothyroidism - Check TSH, continue Synthroid  #5 acute renal failure - Resolved  #6 leg pain and weakness - We'll obtain physical therapy consultation. She lives at home with her husband who is ill. She is his primary caregiver. May need home health services on discharge.  #7 anemia - Has had progressive anemia during this hospitalization. Check LDH heptoglobin and stool guaiac. -Anemia panel suggestive of iron deficiency, check B12 and folate  All the records are reviewed and case discussed with Care Management/Social Workerr. Management plans discussed with the patient, and they are in agreement.  CODE STATUS: Full  TOTAL TIME TAKING CARE OF THIS PATIENT: 30 minutes.  Greater than 50% of time spent in care coordination and counseling. POSSIBLE D/C IN 1-2 DAYS, DEPENDING ON CLINICAL CONDITION.   [name] M.D on 05/10/2015 at 1:40 PM  Between 7am to 6pm - Pager - [phone]  After 6pm go to [email or web address] - password [email or web address] [facility]  Office  [phone]  CC: Primary care physician; Viviana Simpler, MD

## 2015-05-10 NOTE — Progress Notes (Signed)
Pateint a&o, vss. Resting in bed. Eating breakfast. Tolerating diet fine. Contact precautions maintained.

## 2015-05-10 NOTE — Progress Notes (Signed)
[facility]  Daily Progress Note   Subjective  - 1 Day Post-Op  No major events overnight.  No problems after procedure Pain about the same  Objective Filed Vitals:   05/09/15 1737 05/09/15 1951 05/10/15 0438 05/10/15 0852  BP: 106/44 82/23 109/50 104/41  Pulse: 84 76 71 78  Temp:  98.1 F (36.7 C) 98.2 F (36.8 C) 98.3 F (36.8 C)  TempSrc:  Oral Oral Oral  Resp: 20 18 18 18   Height:      Weight:      SpO2: 98% 96% 95% 96%    Intake/Output Summary (Last 24 hours) at 05/10/15 1403 Last data filed at 05/10/15 1120  Gross per 24 hour  Intake 1938.75 ml  Output      0 ml  Net 1938.75 ml    PULM  CTAB CV  RRR VASC  Access site C/D/I.  Wounds about the same  Laboratory CBC    Component Value Date/Time   WBC 15.3* 05/10/2015 0511   WBC 5.8 03/07/2014 1430   HGB 7.1* 05/10/2015 0511   HGB 9.5* 12/26/2014 1439   HCT 22.0* 05/10/2015 0511   PLT 459* 05/10/2015 0511    BMET    Component Value Date/Time   NA 133* 05/10/2015 0313   NA 140 03/07/2014 1430   K 4.4 05/10/2015 0313   CL 102 05/10/2015 0313   CO2 26 05/10/2015 0313   GLUCOSE 105* 05/10/2015 0313   GLUCOSE 95 03/07/2014 1430   BUN 28* 05/10/2015 0313   BUN 27 03/07/2014 1430   CREATININE 0.96 05/10/2015 0313   CALCIUM 7.7* 05/10/2015 0313   GFRNONAA 52* 05/10/2015 0313   GFRAA >60 05/10/2015 0313    Assessment/Planning: POD # 1 s/p BLE angiograms.    No arterial insufficiency.  Previously noted no significant venous disease.  Has bilateral calf/ankle wounds with necrotic eschar  Have discussed with primary service and biopsy is required  I think she would benefit from debridement of these wounds as well with VAC dressing placement that would be continued at discharge for several weeks.  Primary service in agreement.  I have discussed with patient and she desires to proceed.  Will try to schedule for Monday morning pending OR  availability    [name]  05/10/2015, 2:03 PM

## 2015-05-10 NOTE — Progress Notes (Signed)
ANTIBIOTIC CONSULT NOTE - FOLLOW  Pharmacy Consult for Vancomycin and Aztreonam Indication: Bilateral nonhealing ulcers  Allergies  Allergen Reactions  . Dabigatran Etexilate Mesylate Shortness Of Breath  . Coumadin [Warfarin Sodium] Other (See Comments)    hematoma  . Doxycycline Hyclate [Doxycycline] Itching  . Penicillins     Per pt anaphylactic reaction, years ago at age 81 (in college)  . Pradaxa [Dabigatran Etexilate Mesylate] Other (See Comments)    SLEEP  . Sulfonamide Derivatives   . Tape Other (See Comments)    blisters  . Tramadol Hcl     Itching     Patient Measurements: Height: 5' 5.5" (166.4 cm) Weight: 111 lb (50.349 kg) IBW/kg (Calculated) : 58.15   Vital Signs: Temp: 98.3 F (36.8 C) (11/05 0852) Temp Source: Oral (11/05 0852) BP: 104/41 mmHg (11/05 0852) Pulse Rate: 78 (11/05 0852) Intake/Output from previous day: 11/04 0701 - 11/05 0700 In: 1388.8 [I.V.:1288.8; IV Piggyback:100] Out: -  Intake/Output from this shift: Total I/O In: 550 [I.V.:550] Out: 0   Labs:  Recent Labs  05/08/15 1326 05/08/15 1437 05/10/15 0313 05/10/15 0511  WBC  --  16.3*  --  15.3*  HGB  --  8.4*  --  7.1*  PLT  --  565*  --  459*  CREATININE 1.31*  --  0.96  --    Estimated Creatinine Clearance: 33.4 mL/min (by C-G formula based on Cr of 0.96). No results for input(s): VANCOTROUGH, VANCOPEAK, VANCORANDOM, GENTTROUGH, GENTPEAK, GENTRANDOM, TOBRATROUGH, TOBRAPEAK, TOBRARND, AMIKACINPEAK, AMIKACINTROU, AMIKACIN in the last 72 hours.   Microbiology: Recent Results (from the past 720 hour(s))  Wound culture     Status: None   Collection Time: 04/16/15 10:56 AM  Result Value Ref Range Status   Specimen Description ABSCESS  Final   Special Requests NONE  Final   Gram Stain   Final    RARE WBC SEEN MODERATE GRAM POSITIVE COCCI MODERATE GRAM POSITIVE RODS    Culture   Final    HEAVY GROWTH METHICILLIN RESISTANT STAPHYLOCOCCUS AUREUS HEAVY GROWTH COMAMONAS  ACIDOVORANS CRITICAL RESULT CALLED TO, READ BACK BY AND VERIFIED WITH: RITA AFFUL AT 3614 04/23/15 DV    Report Status 04/23/2015 FINAL  Final   Organism ID, Bacteria METHICILLIN RESISTANT STAPHYLOCOCCUS AUREUS  Final   Organism ID, Bacteria COMAMONAS ACIDOVORANS  Final      Susceptibility   Comamonas acidovorans - MIC*    CEFTRIAXONE 2 SENSITIVE Sensitive     CIPROFLOXACIN <=0.25 SENSITIVE Sensitive     GENTAMICIN >=16 RESISTANT Resistant     IMIPENEM <=0.25 SENSITIVE Sensitive     TRIMETH/SULFA <=20 SENSITIVE Sensitive     * HEAVY GROWTH COMAMONAS ACIDOVORANS   Methicillin resistant staphylococcus aureus - MIC*    CIPROFLOXACIN <=0.5 SENSITIVE Sensitive     GENTAMICIN <=0.5 SENSITIVE Sensitive     OXACILLIN >=4 RESISTANT Resistant     VANCOMYCIN 1 SENSITIVE Sensitive     TRIMETH/SULFA <=10 SENSITIVE Sensitive     CEFOXITIN SCREEN Value in next row Resistant      POSITIVECEFOXITIN SCREEN - This test may be used to predict mecA-mediated oxacillin resistance, and it is based on the cefoxitin disk screen test.  The cefoxitin screen and oxacillin work in combination to determine the final interpretation reported for oxacillin.     Inducible Clindamycin Value in next row Sensitive      POSITIVECEFOXITIN SCREEN - This test may be used to predict mecA-mediated oxacillin resistance, and it is based on the cefoxitin disk  screen test.  The cefoxitin screen and oxacillin work in combination to determine the final interpretation reported for oxacillin.     TETRACYCLINE Value in next row Sensitive      SENSITIVE<=1    * HEAVY GROWTH METHICILLIN RESISTANT STAPHYLOCOCCUS AUREUS  Blood culture (routine x 2)     Status: None (Preliminary result)   Collection Time: 05/08/15  1:26 PM  Result Value Ref Range Status   Specimen Description BLOOD LEFT ASSIST CONTROL  Final   Special Requests BOTTLES DRAWN AEROBIC AND ANAEROBIC 6CC  Final   Culture NO GROWTH 2 DAYS  Final   Report Status PENDING   Incomplete  Blood culture (routine x 2)     Status: None (Preliminary result)   Collection Time: 05/08/15  2:21 PM  Result Value Ref Range Status   Specimen Description BLOOD RIGHT ASSIST CONTROL  Final   Special Requests BOTTLES DRAWN AEROBIC AND ANAEROBIC 5CC  Final   Culture NO GROWTH 2 DAYS  Final   Report Status PENDING  Incomplete  Wound culture     Status: None (Preliminary result)   Collection Time: 05/08/15  6:02 PM  Result Value Ref Range Status   Specimen Description LEG  Final   Special Requests NONE  Final   Gram Stain RARE WBC SEEN FEW GRAM NEGATIVE RODS   Final   Culture MODERATE PSEUDOMONAS AERUGINOSA  Final   Report Status PENDING  Incomplete   Organism ID, Bacteria PSEUDOMONAS AERUGINOSA  Final      Susceptibility   Pseudomonas aeruginosa - MIC*    CEFTAZIDIME 4 SENSITIVE Sensitive     CIPROFLOXACIN <=0.25 SENSITIVE Sensitive     GENTAMICIN <=1 SENSITIVE Sensitive     IMIPENEM >=16 RESISTANT Resistant     PIP/TAZO Value in next row Sensitive      SENSITIVE8    * MODERATE PSEUDOMONAS AERUGINOSA  MRSA PCR Screening     Status: None   Collection Time: 05/09/15 12:40 PM  Result Value Ref Range Status   MRSA by PCR NEGATIVE NEGATIVE Final    Comment:        The GeneXpert MRSA Assay (FDA approved for NASAL specimens only), is one component of a comprehensive MRSA colonization surveillance program. It is not intended to diagnose MRSA infection nor to guide or monitor treatment for MRSA infections.     Medical History: Past Medical History  Diagnosis Date  . Arthritis   . Hyperlipidemia   . Hypothyroidism   . Atrial fibrillation ([place])     a. s/p prior DCCV's, now persistent-->Xarelto (CHA2DS2VASc = 3).  . Diverticulosis     a. by colonoscopy, h/o colon adenoma ([place])  . Gastritis and duodenitis     a. 01/2014 EGD: Moderate non-erosive gastritis and mild non-erosive duodenitis.    Assessment: 81 yo female here with bilateral necrotizing,  nonhealing ulcers.   Ke 0.032, half life 22 h, Vd 35 L   Goal of Therapy:  Vancomycin trough level 15-20 mcg/ml  Plan:  Current orders for vancomycin 750mg  IV Q36H. Renal funciton improved, will change to vancomycin 750mg  IV Q24H to target trough of 15-22mcg/ml. Trough ordered prior to third dose of this regimen on 11/7 as this will be day 4 of vancomycin. This will not be at steady state. Would recommend checking true trough at steady state before adjusting dose.  Will also increase aztreonam from 500mg  Q8H to 1gm Q8H due to CrCl > 50ml/min.   Pharmacy will continue to follow.   [address],  PharmD Clinical Pharmacist  05/10/2015 1:32 PM

## 2015-05-11 ENCOUNTER — Encounter: Payer: Self-pay | Admitting: Infectious Diseases

## 2015-05-11 DIAGNOSIS — L97921 Non-pressure chronic ulcer of unspecified part of left lower leg limited to breakdown of skin: Secondary | ICD-10-CM

## 2015-05-11 DIAGNOSIS — L97911 Non-pressure chronic ulcer of unspecified part of right lower leg limited to breakdown of skin: Secondary | ICD-10-CM

## 2015-05-11 LAB — CBC
HCT: 21.7 % — ABNORMAL LOW (ref 35.0–47.0)
Hemoglobin: 7 g/dL — ABNORMAL LOW (ref 12.0–16.0)
MCH: 28.5 pg (ref 26.0–34.0)
MCHC: 32.4 g/dL (ref 32.0–36.0)
MCV: 88 fL (ref 80.0–100.0)
PLATELETS: 409 10*3/uL (ref 150–440)
RBC: 2.47 MIL/uL — ABNORMAL LOW (ref 3.80–5.20)
RDW: 18.8 % — AB (ref 11.5–14.5)
WBC: 16.8 10*3/uL — AB (ref 3.6–11.0)

## 2015-05-11 LAB — BASIC METABOLIC PANEL
Anion gap: 5 (ref 5–15)
BUN: 26 mg/dL — ABNORMAL HIGH (ref 6–20)
CALCIUM: 7.9 mg/dL — AB (ref 8.9–10.3)
CO2: 26 mmol/L (ref 22–32)
CREATININE: 0.92 mg/dL (ref 0.44–1.00)
Chloride: 104 mmol/L (ref 101–111)
GFR, EST NON AFRICAN AMERICAN: 55 mL/min — AB (ref >60)
Glucose, Bld: 97 mg/dL (ref 65–99)
Potassium: 4.3 mmol/L (ref 3.5–5.1)
SODIUM: 135 mmol/L (ref 135–145)

## 2015-05-11 LAB — WOUND CULTURE

## 2015-05-11 LAB — HAPTOGLOBIN: Haptoglobin: 352 mg/dL — ABNORMAL HIGH (ref 34–200)

## 2015-05-11 MED ORDER — FERROUS SULFATE 325 (65 FE) MG PO TABS
325.0000 mg | ORAL_TABLET | Freq: Two times a day (BID) | ORAL | Status: DC
  Administered 2015-05-11 – 2015-05-13 (×3): 325 mg via ORAL
  Filled 2015-05-11 (×3): qty 1

## 2015-05-11 NOTE — Evaluation (Signed)
Physical Therapy Evaluation Patient Details Name: [name] MRN: [id number] DOB: 01/26/1927 Today's Date: 05/11/2015   History of Present Illness  Pt is here with b/l LE ulcerations  Clinical Impression  Pt is able to ambulate well with walker and appears to have good static standing balance w/o AD.  Initially she reports that she felt she would need rehab, but ultimately she does better than she expected and showed safety and confidence with mobility acts and ambulation.     Follow Up Recommendations Home health PT    Equipment Recommendations  Rolling walker with 5" wheels (per progress)    Recommendations for Other Services       Precautions / Restrictions Precautions Precautions: Fall Restrictions Weight Bearing Restrictions: No      Mobility  Bed Mobility Overal bed mobility: Independent             General bed mobility comments: Pt able to get to EOB w/o assist, c/o some LE discomfort  Transfers Overall transfer level: Independent Equipment used: Rolling walker (2 wheeled) (pt also stands one time w/o AD)             General transfer comment: Pt is able to rise to standing with good confidence and though she needs UE use to rise she has no safety issues  Ambulation/Gait Ambulation/Gait assistance: Supervision Ambulation Distance (Feet): 40 Feet Assistive device: Rolling walker (2 wheeled)       General Gait Details: Pt did well with ambulation using walker and was actually able to take a few steps w/o RW when getting to the recliner after ambulation.  She reports not being as confident or steady as her baseline.  Stairs            Wheelchair Mobility    Modified Rankin (Stroke Patients Only)       Balance                                             Pertinent Vitals/Pain Pain Assessment:  (unrated general b/l LE pain/soreness)    Home Living Family/patient expects to be discharged to:: Private  residence Living Arrangements: Spouse/significant other Available Help at Discharge: Family Type of Home: House         Home Equipment: [name] - single point      Prior Function Level of Independence: Independent         Comments: Pt reports that normally she is able to be active and care for her husband, has been using a SPC recently     Hand Dominance        Extremity/Trunk Assessment   Upper Extremity Assessment: Overall WFL for tasks assessed           Lower Extremity Assessment: Overall WFL for tasks assessed (though pt is sensitive with b/l lower leg palpations)         Communication   Communication: No difficulties  Cognition Arousal/Alertness: Awake/alert Behavior During Therapy: Impulsive Overall Cognitive Status: Within Functional Limits for tasks assessed                      General Comments      Exercises        Assessment/Plan    PT Assessment Patient needs continued PT services  PT Diagnosis Difficulty walking;Generalized weakness   PT Problem List Decreased strength;Decreased range of motion;Decreased  activity tolerance;Decreased balance;Decreased mobility;Decreased coordination;Decreased knowledge of use of DME;Decreased safety awareness  PT Treatment Interventions Gait training;Stair training;Functional mobility training;Therapeutic activities;Therapeutic exercise;Balance training;Neuromuscular re-education   PT Goals (Current goals can be found in the Care Plan section) Acute Rehab PT Goals Patient Stated Goal: I need to get stronger PT Goal Formulation: With patient Time For Goal Achievement: 05/25/15 Potential to Achieve Goals: Good    Frequency Min 2X/week   Barriers to discharge        Co-evaluation               End of Session Equipment Utilized During Treatment: Gait belt Activity Tolerance: Patient tolerated treatment well Patient left: with chair alarm set Nurse Communication: Mobility status          Time: 6950-7225 PT Time Calculation (min) (ACUTE ONLY): 22 min   Charges:   PT Evaluation $Initial PT Evaluation Tier I: 1 Procedure     PT G Codes:       Wayne Both, PT, DPT [phone]  Kreg Shropshire 05/11/2015, 4:45 PM

## 2015-05-11 NOTE — Progress Notes (Signed)
Patient is sitting up in bed eating lunch. CSW presented three bed offers to patient [name] and explained SNF process for STR. Patient states "this is all overwhelming to me".  Wants to discuss the options with her husband, however states she does not like any of the offers and wants to discharge to [name].  Informed patient she may have other offers on Monday but at this time she only has three.     PT consult pending. CSW will follow-up with additional SNF offers on Monday  [name]. [name], MSW Clinical Social Work Department [phone] 12:11 PM

## 2015-05-11 NOTE — Progress Notes (Signed)
Patient a&o, VSS. Pain medications given as needed for pain. Dressings dry and intact on lower legs. Husband at bedside. Continue to monitor.

## 2015-05-11 NOTE — Progress Notes (Signed)
[place] [facility] [place] NAME: [name]    MR#:  [id number]  DATE OF BIRTH:  1926-07-24  SUBJECTIVE:  CHIEF COMPLAINT:   Chief Complaint  Patient presents with  . Wound Infection   Complains of bilateral leg pain. Concerned about lack of mobility.  REVIEW OF SYSTEMS:   Review of Systems  Constitutional: Negative for fever and chills.  Respiratory: Negative for shortness of breath.   Cardiovascular: Negative for chest pain and palpitations.  Gastrointestinal: Negative for nausea, vomiting and abdominal pain.  Genitourinary: Negative for dysuria.  Skin: Positive for rash.    DRUG ALLERGIES:   Allergies  Allergen Reactions  . Dabigatran Etexilate Mesylate Shortness Of Breath  . Coumadin [Warfarin Sodium] Other (See Comments)    hematoma  . Doxycycline Hyclate [Doxycycline] Itching  . Penicillins     Per pt anaphylactic reaction, years ago at age 23 (in college)  . Pradaxa [Dabigatran Etexilate Mesylate] Other (See Comments)    SLEEP  . Sulfonamide Derivatives   . Tape Other (See Comments)    blisters  . Tramadol Hcl     Itching     VITALS:  Blood pressure 121/47, pulse 94, temperature 98.4 F (36.9 C), temperature source Oral, resp. rate 18, height 5' 5.5" (1.664 m), weight 50.349 kg (111 lb), SpO2 96 %.  PHYSICAL EXAMINATION:  GENERAL:  81 y.o.-year-old patient sitting up eating breakfast with no acute distress.  LUNGS: Normal breath sounds bilaterally, no wheezing, rales,rhonchi or crepitation. No use of accessory muscles of respiration.  CARDIOVASCULAR: S1, S2 normal. No murmurs, rubs, or gallops.  ABDOMEN: Soft, nontender, nondistended. Bowel sounds present. No organomegaly or mass.  EXTREMITIES: No edema, bluish discoloration bilateral lower extremities, black eschar over anterior lower extremity ulcers, no drainage, no erythema  NEUROLOGIC: Cranial nerves II through XII are intact. Muscle strength 5/5 in all  extremities. Sensation intact. PSYCHIATRIC: The patient is alert and oriented x 3.  SKIN: See above description of extremities   LABORATORY PANEL:   CBC  Recent Labs Lab 05/11/15 0215  WBC 16.8*  HGB 7.0*  HCT 21.7*  PLT 409   ------------------------------------------------------------------------------------------------------------------  Chemistries   Recent Labs Lab 05/11/15 0215  NA 135  K 4.3  CL 104  CO2 26  GLUCOSE 97  BUN 26*  CREATININE 0.92  CALCIUM 7.9*   ------------------------------------------------------------------------------------------------------------------  Cardiac Enzymes No results for input(s): TROPONINI in the last 168 hours. ------------------------------------------------------------------------------------------------------------------  RADIOLOGY:  No results found.  EKG:   Orders placed or performed in visit on 07/29/14  . EKG 12-Lead    ASSESSMENT AND PLAN:  81 year old female patient with chronic atrial fibrillation on eliquis, rheumatoid arthritis on methotrexate, Plaquenil, history of GI bleed with av malformations comes to the emergency room secondary to bilateral lower extremity necrotic ulcers.. Patient is followed by wound care, was sent to rheumatology office yesterday for emergency evaluation. Dr. Myna Hidalgo send her to the emergency room for evaluation. noted pain in both legs 3 weeks ago , developed ulcers which continued to enlarge. Patient was seen by wound care started on topical. Noted to have very painful ulcers.in Both legs Did not have any blisters.    #1 bilateral lower extremity ulcerations and necrosis - Rheumatologic labs pending - Culture showing Pseudomonas sensitive to ciprofloxacin  - Aortogram showing no significant stenoses of either leg - Eliquis not known to cause ulceration or tissue necrosis - Plan is for biopsy and debridement tomorrow by Dr. dew  #  2 chronic atrial fibrillation - Appreciate  cardiology consultation - Rate is controlled, continue atenolol and resume eliquis  #3 GERD and history of gastrointestinal AVM - Continue PPI  #4 hypothyroidism -  TSH is slightly high, continue Synthroid  #5 acute renal failure - Resolved  #6 leg pain and weakness - We'll awaiting therapy consultation. She lives at home with her husband who is ill. She is his primary caregiver. May need home health services on discharge versus skilled nursing.  #7 anemia - Has had progressive anemia during this hospitalization.  stool guaiac. -Anemia panel suggestive of AOCD. B12 and folate are normal.  All the records are reviewed and case discussed with Care Management/Social Workerr. Management plans discussed with the patient, and they are in agreement.   The patient would very much like to be discharged in time to vote on Tuesday  CODE STATUS: Full  TOTAL TIME TAKING CARE OF THIS PATIENT: 30 minutes.  Greater than 50% of time spent in care coordination and counseling. POSSIBLE D/C IN 1-2 DAYS, DEPENDING ON CLINICAL CONDITION.   [name] M.D on 05/11/2015 at 1:24 PM  Between 7am to 6pm - Pager - [phone]  After 6pm go to [email or web address] - password [email or web address] [facility]  Office  [phone]  CC: Primary care physician; Viviana Simpler, MD

## 2015-05-11 NOTE — Care Management Important Message (Signed)
Important Message  Patient Details  Name: [name] MRN: [id number] Date of Birth: Nov 09, 1926   Medicare Important Message Given:  Yes-second notification given    Shepherd Finnan A, RN 05/11/2015, 4:01 PM

## 2015-05-11 NOTE — Consult Note (Signed)
[facility] Infectious Disease     Reason for Consult: LE ulcers Referring Physician: Merita Norton Date of Admission:  05/08/2015   Active Problems:   Bilateral leg ulcer ([place])   Ulcers of both lower extremities, limited to breakdown of skin ([place])   HPI: [name] is a 81 y.o. female with hx of A fib, CKD, RA, Htn, Hypothyroidism, HDL who approx 3 weeks ago developed pain and ulceration bil LE.  This has since progessed despite topical and oral abx and vascular evaluation.  Cultures have grown MRSA and Comamonas acidovorans from the wound.  She has a hx of bil skin hematomas from coumadin and reports having to have skin grafts in the past for this.  She reports in July a bite from a woodchuck which her dog had brought into the house. The animal was tested for and ruled out for rabies  Past Medical History  Diagnosis Date  . Arthritis   . Hyperlipidemia   . Hypothyroidism   . Atrial fibrillation ([place])     a. s/p prior DCCV's, now persistent-->Xarelto (CHA2DS2VASc = 3).  . Diverticulosis     a. by colonoscopy, h/o colon adenoma ([place])  . Gastritis and duodenitis     a. 01/2014 EGD: Moderate non-erosive gastritis and mild non-erosive duodenitis.   Past Surgical History  Procedure Laterality Date  . Rotator cuff repair    . Intraocular lens implant, secondary  1996/ 2010  . Knee surgery      lateral  . Tonsillectomy    . Colonoscopy  02/2011    mod diverticulosis ([place])  . Total knee arthroplasty Bilateral    Social History  Substance Use Topics  . Smoking status: Former Smoker    Types: Cigarettes  . Smokeless tobacco: Never Used  . Alcohol Use: Yes   Family History  Problem Relation Age of Onset  . Heart disease Mother     Allergies:  Allergies  Allergen Reactions  . Dabigatran Etexilate Mesylate Shortness Of Breath  . Coumadin [Warfarin Sodium] Other (See Comments)    hematoma  . Doxycycline Hyclate [Doxycycline] Itching  . Penicillins     Per pt  anaphylactic reaction, years ago at age 69 (in college)  . Pradaxa [Dabigatran Etexilate Mesylate] Other (See Comments)    SLEEP  . Sulfonamide Derivatives   . Tape Other (See Comments)    blisters  . Tramadol Hcl     Itching     Current antibiotics: Antibiotics Given (last 72 hours)    Date/Time Action Medication Dose Rate   05/08/15 1443 Given   vancomycin (VANCOCIN) IVPB 750 mg/150 ml premix 750 mg 150 mL/hr   05/08/15 1814 Given   aztreonam (AZACTAM) 500 mg in dextrose 5 % 50 mL IVPB 500 mg 100 mL/hr   05/08/15 2154 Given   hydroxychloroquine (PLAQUENIL) tablet 400 mg 400 mg    05/09/15 0453 Given   aztreonam (AZACTAM) 500 mg in dextrose 5 % 50 mL IVPB 500 mg 100 mL/hr   05/09/15 5631 Given   vancomycin (VANCOCIN) IVPB 750 mg/150 ml premix 750 mg 150 mL/hr   05/09/15 1405 Given   aztreonam (AZACTAM) 500 mg in dextrose 5 % 50 mL IVPB 500 mg 100 mL/hr   05/09/15 1520 Given   dextrose 5 % with cefUROXime (ZINACEF) ADS Med     05/09/15 2111 Given   hydroxychloroquine (PLAQUENIL) tablet 400 mg 400 mg    05/09/15 2112 Given   aztreonam (AZACTAM) 500 mg in dextrose 5 %  50 mL IVPB 500 mg 100 mL/hr   05/10/15 6295 Given   aztreonam (AZACTAM) 500 mg in dextrose 5 % 50 mL IVPB 500 mg 100 mL/hr   05/10/15 1518 Given   ciprofloxacin (CIPRO) IVPB 400 mg 400 mg 200 mL/hr   05/11/15 0147 Given   ciprofloxacin (CIPRO) IVPB 400 mg 400 mg 200 mL/hr   05/11/15 1321 Given   ciprofloxacin (CIPRO) IVPB 400 mg 400 mg 200 mL/hr      MEDICATIONS: . atenolol  25 mg Oral Daily  . calcium carbonate  1,250 mg Oral Daily  . ciprofloxacin  400 mg Intravenous Q12H  . docusate sodium  100 mg Oral BID  . feeding supplement (ENSURE ENLIVE)  237 mL Oral BID BM  . ferrous sulfate  325 mg Oral BID WC  . folic acid  2 mg Oral Daily  . furosemide  40 mg Oral Daily  . hydroxychloroquine  400 mg Oral QHS  . levothyroxine  75 mcg Oral QAC breakfast  . oxyCODONE  10 mg Oral Q12H    Review of Systems -  11 systems reviewed and negative per HPI   OBJECTIVE: Temp:  [98 F (36.7 C)-99.6 F (37.6 C)] 98.4 F (36.9 C) (11/06 0759) Pulse Rate:  [67-94] 94 (11/06 0759) Resp:  [18] 18 (11/06 0759) BP: (95-124)/(42-49) 121/47 mmHg (11/06 0759) SpO2:  [94 %-96 %] 96 % (11/06 0759) Physical Exam  Constitutional:  oriented to person, place, and time. appears well-developed and well-nourished. No distress. Thin HENT: [place]/AT, PERRLA, no scleral icterus Mouth/Throat: Oropharynx is clear and moist. No oropharyngeal exudate.  Cardiovascular: Normal rate, regular rhythm and normal heart sounds.  Pulmonary/Chest: Effort normal and breath sounds normal. No respiratory distress.  has no wheezes.  Neck supple, no nuchal rigidity Abdominal: Soft. Bowel sounds are normal.  Lymphadenopathy: no cervical adenopathy. No axillary adenopathy Neurological: alert and oriented to person, place, and time.  Skin: Skin is warm and dry. BIl LE with impressive large dry black eschar over ant shins.  The entire legs below knees are discolored Psychiatric: a normal mood and affect.  behavior is normal.    LABS: Results for orders placed or performed during the hospital encounter of 05/08/15 (from the past 48 hour(s))  Basic metabolic panel     Status: Abnormal   Collection Time: 05/10/15  3:13 AM  Result Value Ref Range   Sodium 133 (L) 135 - 145 mmol/L   Potassium 4.4 3.5 - 5.1 mmol/L   Chloride 102 101 - 111 mmol/L   CO2 26 22 - 32 mmol/L   Glucose, Bld 105 (H) 65 - 99 mg/dL   BUN 28 (H) 6 - 20 mg/dL   Creatinine, Ser 0.96 0.44 - 1.00 mg/dL   Calcium 7.7 (L) 8.9 - 10.3 mg/dL   GFR calc non Af Amer 52 (L) >60 mL/min   GFR calc Af Amer >60 >60 mL/min    Comment: (NOTE) The eGFR has been calculated using the CKD EPI equation. This calculation has not been validated in all clinical situations. eGFR's persistently <60 mL/min signify possible Chronic Kidney Disease.    Anion gap 5 5 - 15  Vitamin B12     Status:  Abnormal   Collection Time: 05/10/15  3:13 AM  Result Value Ref Range   Vitamin B-12 3524 (H) 180 - 914 pg/mL    Comment: (NOTE) This assay is not validated for testing neonatal or myeloproliferative syndrome specimens for Vitamin B12 levels. Performed at [facility]  CBC     Status: Abnormal   Collection Time: 05/10/15  5:11 AM  Result Value Ref Range   WBC 15.3 (H) 3.6 - 11.0 K/uL    Comment: WHITE COUNT CONFIRMED ON SMEAR UNKNOWN INTERFERENCE SEEN IN ALL SAMPLES    RBC 2.52 (L) 3.80 - 5.20 MIL/uL   Hemoglobin 7.1 (L) 12.0 - 16.0 g/dL   HCT 22.0 (L) 35.0 - 47.0 %   MCV 87.4 80.0 - 100.0 fL   MCH 28.2 26.0 - 34.0 pg   MCHC 32.3 32.0 - 36.0 g/dL   RDW 19.0 (H) 11.5 - 14.5 %   Platelets 459 (H) 150 - 440 K/uL    Comment: PLATELET COUNT CONFIRMED BY SMEAR  Lactate dehydrogenase     Status: None   Collection Time: 05/10/15  5:11 AM  Result Value Ref Range   LDH 161 98 - 192 U/L  Folate     Status: None   Collection Time: 05/10/15  5:11 AM  Result Value Ref Range   Folate 13.1 >5.9 ng/mL  TSH     Status: Abnormal   Collection Time: 05/10/15  5:11 AM  Result Value Ref Range   TSH 5.774 (H) 0.350 - 4.500 uIU/mL  CBC     Status: Abnormal   Collection Time: 05/11/15  2:15 AM  Result Value Ref Range   WBC 16.8 (H) 3.6 - 11.0 K/uL    Comment: WHITE COUNT CONFIRMED ON SMEAR UNKNOWN INTERFERENCE POSSIBLY DUE TO MEDICATION    RBC 2.47 (L) 3.80 - 5.20 MIL/uL   Hemoglobin 7.0 (L) 12.0 - 16.0 g/dL   HCT 21.7 (L) 35.0 - 47.0 %   MCV 88.0 80.0 - 100.0 fL   MCH 28.5 26.0 - 34.0 pg   MCHC 32.4 32.0 - 36.0 g/dL   RDW 18.8 (H) 11.5 - 14.5 %   Platelets 409 150 - 440 K/uL    Comment: PLATELET COUNT CONFIRMED BY SMEAR  Basic metabolic panel     Status: Abnormal   Collection Time: 05/11/15  2:15 AM  Result Value Ref Range   Sodium 135 135 - 145 mmol/L   Potassium 4.3 3.5 - 5.1 mmol/L   Chloride 104 101 - 111 mmol/L   CO2 26 22 - 32 mmol/L   Glucose, Bld 97 65 - 99 mg/dL    BUN 26 (H) 6 - 20 mg/dL   Creatinine, Ser 0.92 0.44 - 1.00 mg/dL   Calcium 7.9 (L) 8.9 - 10.3 mg/dL   GFR calc non Af Amer 55 (L) >60 mL/min   GFR calc Af Amer >60 >60 mL/min    Comment: (NOTE) The eGFR has been calculated using the CKD EPI equation. This calculation has not been validated in all clinical situations. eGFR's persistently <60 mL/min signify possible Chronic Kidney Disease.    Anion gap 5 5 - 15   No components found for: ESR, C REACTIVE PROTEIN MICRO: Recent Results (from the past 720 hour(s))  Wound culture     Status: None   Collection Time: 04/16/15 10:56 AM  Result Value Ref Range Status   Specimen Description ABSCESS  Final   Special Requests NONE  Final   Gram Stain   Final    RARE WBC SEEN MODERATE GRAM POSITIVE COCCI MODERATE GRAM POSITIVE RODS    Culture   Final    HEAVY GROWTH METHICILLIN RESISTANT STAPHYLOCOCCUS AUREUS HEAVY GROWTH COMAMONAS ACIDOVORANS CRITICAL RESULT CALLED TO, READ BACK BY AND VERIFIED WITH: RITA AFFUL AT 1322 04/23/15 DV  Report Status 04/23/2015 FINAL  Final   Organism ID, Bacteria METHICILLIN RESISTANT STAPHYLOCOCCUS AUREUS  Final   Organism ID, Bacteria COMAMONAS ACIDOVORANS  Final      Susceptibility   Comamonas acidovorans - MIC*    CEFTRIAXONE 2 SENSITIVE Sensitive     CIPROFLOXACIN <=0.25 SENSITIVE Sensitive     GENTAMICIN >=16 RESISTANT Resistant     IMIPENEM <=0.25 SENSITIVE Sensitive     TRIMETH/SULFA <=20 SENSITIVE Sensitive     * HEAVY GROWTH COMAMONAS ACIDOVORANS   Methicillin resistant staphylococcus aureus - MIC*    CIPROFLOXACIN <=0.5 SENSITIVE Sensitive     GENTAMICIN <=0.5 SENSITIVE Sensitive     OXACILLIN >=4 RESISTANT Resistant     VANCOMYCIN 1 SENSITIVE Sensitive     TRIMETH/SULFA <=10 SENSITIVE Sensitive     CEFOXITIN SCREEN Value in next row Resistant      POSITIVECEFOXITIN SCREEN - This test may be used to predict mecA-mediated oxacillin resistance, and it is based on the cefoxitin disk screen  test.  The cefoxitin screen and oxacillin work in combination to determine the final interpretation reported for oxacillin.     Inducible Clindamycin Value in next row Sensitive      POSITIVECEFOXITIN SCREEN - This test may be used to predict mecA-mediated oxacillin resistance, and it is based on the cefoxitin disk screen test.  The cefoxitin screen and oxacillin work in combination to determine the final interpretation reported for oxacillin.     TETRACYCLINE Value in next row Sensitive      SENSITIVE<=1    * HEAVY GROWTH METHICILLIN RESISTANT STAPHYLOCOCCUS AUREUS  Blood culture (routine x 2)     Status: None (Preliminary result)   Collection Time: 05/08/15  1:26 PM  Result Value Ref Range Status   Specimen Description BLOOD LEFT ASSIST CONTROL  Final   Special Requests BOTTLES DRAWN AEROBIC AND ANAEROBIC 6CC  Final   Culture NO GROWTH 3 DAYS  Final   Report Status PENDING  Incomplete  Blood culture (routine x 2)     Status: None (Preliminary result)   Collection Time: 05/08/15  2:21 PM  Result Value Ref Range Status   Specimen Description BLOOD RIGHT ASSIST CONTROL  Final   Special Requests BOTTLES DRAWN AEROBIC AND ANAEROBIC 5CC  Final   Culture NO GROWTH 3 DAYS  Final   Report Status PENDING  Incomplete  Wound culture     Status: None   Collection Time: 05/08/15  6:02 PM  Result Value Ref Range Status   Specimen Description LEG  Final   Special Requests NONE  Final   Gram Stain RARE WBC SEEN FEW GRAM NEGATIVE RODS   Final   Culture HEAVY GROWTH PSEUDOMONAS AERUGINOSA  Final   Report Status 05/11/2015 FINAL  Final   Organism ID, Bacteria PSEUDOMONAS AERUGINOSA  Final      Susceptibility   Pseudomonas aeruginosa - MIC*    CEFTAZIDIME 4 SENSITIVE Sensitive     CIPROFLOXACIN <=0.25 SENSITIVE Sensitive     GENTAMICIN <=1 SENSITIVE Sensitive     IMIPENEM >=16 RESISTANT Resistant     PIP/TAZO Value in next row Sensitive      SENSITIVE8    * HEAVY GROWTH PSEUDOMONAS AERUGINOSA   MRSA PCR Screening     Status: None   Collection Time: 05/09/15 12:40 PM  Result Value Ref Range Status   MRSA by PCR NEGATIVE NEGATIVE Final    Comment:        The GeneXpert MRSA Assay (FDA approved for NASAL specimens  only), is one component of a comprehensive MRSA colonization surveillance program. It is not intended to diagnose MRSA infection nor to guide or monitor treatment for MRSA infections.     IMAGING: No results found.  Assessment:   [name] is a 81 y.o. female with MMP including RA on immunosuppressants admitted with unusual extensive bil LE ulceration rapidly progressive over 3 weeks.  Her arterial supply seems intact with very good bil DP and PT pulses. Had a groudhog bite 3 months ago but this seemed to heal up. She has a hx of requiring skin grafts to LE from Coumdain related hematomas. Cxs with MRSA and Comamonas (a gram negative rod formerly in the Pseudomonas species).    Recommendations Continue current antibiotics Would suggest bxp with path, routine cx, fungal and afb culture   Thank you very much for allowing me to participate in the care of this patient. Please call with questions.   Cheral Marker. Ola Spurr, MD

## 2015-05-11 NOTE — Care Management Note (Signed)
Case Management Note  Patient Details  Name: [name] MRN: [id number] Date of Birth: 11-20-1926  Subjective/Objective:       Pending PT evaluation. Anticipate discharge to SNF. Social work is following for SNF placement.              Action/Plan:   Expected Discharge Date:                  Expected Discharge Plan:     In-House Referral:     Discharge planning Services     Post Acute Care Choice:    Choice offered to:     DME Arranged:    DME Agency:     HH Arranged:    [place] Agency:     Status of Service:     Medicare Important Message Given:  Yes-second notification given Date Medicare IM Given:    Medicare IM give by:    Date Additional Medicare IM Given:    Additional Medicare Important Message give by:     If discussed at [place] of Stay Meetings, dates discussed:    Additional Comments:  Japji Kok A, RN 05/11/2015, 9:51 AM

## 2015-05-12 ENCOUNTER — Telehealth: Payer: Self-pay | Admitting: Internal Medicine

## 2015-05-12 ENCOUNTER — Encounter
Admission: EM | Disposition: A | Discharge status: Skilled Nursing Facility | Payer: Self-pay | Source: Home / Self Care | Attending: Internal Medicine

## 2015-05-12 ENCOUNTER — Inpatient Hospital Stay: Payer: Medicare Other | Admitting: Registered Nurse

## 2015-05-12 ENCOUNTER — Encounter: Payer: Self-pay | Admitting: Anesthesiology

## 2015-05-12 HISTORY — PX: I & D EXTREMITY: SHX5045

## 2015-05-12 LAB — CBC
HEMATOCRIT: 21.7 % — AB (ref 35.0–47.0)
HEMATOCRIT: 24.5 % — AB (ref 35.0–47.0)
HEMOGLOBIN: 7.6 g/dL — AB (ref 12.0–16.0)
Hemoglobin: 7 g/dL — ABNORMAL LOW (ref 12.0–16.0)
MCH: 28.3 pg (ref 26.0–34.0)
MCH: 27.4 pg (ref 26.0–34.0)
MCHC: 32.2 g/dL (ref 32.0–36.0)
MCHC: 31.1 g/dL — AB (ref 32.0–36.0)
MCV: 87.9 fL (ref 80.0–100.0)
MCV: 88 fL (ref 80.0–100.0)
Platelets: 414 10*3/uL (ref 150–440)
Platelets: 494 10*3/uL — ABNORMAL HIGH (ref 150–440)
RBC: 2.47 MIL/uL — AB (ref 3.80–5.20)
RBC: 2.79 MIL/uL — ABNORMAL LOW (ref 3.80–5.20)
RDW: 19.2 % — ABNORMAL HIGH (ref 11.5–14.5)
RDW: 20.1 % — ABNORMAL HIGH (ref 11.5–14.5)
WBC: 30.8 10*3/uL — ABNORMAL HIGH (ref 3.6–11.0)
WBC: 18.6 10*3/uL — ABNORMAL HIGH (ref 3.6–11.0)

## 2015-05-12 LAB — BASIC METABOLIC PANEL
ANION GAP: 4 — AB (ref 5–15)
BUN: 22 mg/dL — ABNORMAL HIGH (ref 6–20)
CO2: 27 mmol/L (ref 22–32)
Calcium: 8.1 mg/dL — ABNORMAL LOW (ref 8.9–10.3)
Chloride: 103 mmol/L (ref 101–111)
Creatinine, Ser: 0.92 mg/dL (ref 0.44–1.00)
GFR calc Af Amer: >60 mL/min (ref >60)
GFR calc non Af Amer: 55 mL/min — ABNORMAL LOW (ref >60)
GLUCOSE: 95 mg/dL (ref 65–99)
POTASSIUM: 4.5 mmol/L (ref 3.5–5.1)
Sodium: 134 mmol/L — ABNORMAL LOW (ref 135–145)

## 2015-05-12 LAB — MPO/PR-3 (ANCA) ANTIBODIES: Myeloperoxidase Abs: <9 U/mL (ref 0.0–9.0)

## 2015-05-12 LAB — ANA W/REFLEX IF POSITIVE: ANA: NEGATIVE

## 2015-05-12 LAB — RHEUMATOID FACTOR: RHEUMATOID FACTOR: 558.1 [IU]/mL — AB (ref 0.0–13.9)

## 2015-05-12 LAB — ANCA TITERS
Atypical P-ANCA titer: <1:20 {titer}
P-ANCA: <1:20 {titer}

## 2015-05-12 SURGERY — IRRIGATION AND DEBRIDEMENT EXTREMITY
Anesthesia: General | Laterality: Bilateral

## 2015-05-12 MED ORDER — PROPOFOL 10 MG/ML IV BOLUS
INTRAVENOUS | Status: DC | PRN
  Administered 2015-05-12: 100 mg via INTRAVENOUS
  Administered 2015-05-12 (×2): 50 mg via INTRAVENOUS

## 2015-05-12 MED ORDER — CHLORHEXIDINE GLUCONATE CLOTH 2 % EX PADS
6.0000 | MEDICATED_PAD | Freq: Once | CUTANEOUS | Status: AC
  Administered 2015-05-12: 6 via TOPICAL

## 2015-05-12 MED ORDER — FENTANYL CITRATE (PF) 100 MCG/2ML IJ SOLN
25.0000 ug | Freq: Once | INTRAMUSCULAR | Status: AC
  Administered 2015-05-12: 25 ug via INTRAVENOUS

## 2015-05-12 MED ORDER — PHENYLEPHRINE HCL 10 MG/ML IJ SOLN
INTRAMUSCULAR | Status: DC | PRN
  Administered 2015-05-12 (×4): 100 ug via INTRAVENOUS

## 2015-05-12 MED ORDER — SODIUM CHLORIDE 0.9 % IV SOLN
INTRAVENOUS | Status: DC
  Administered 2015-05-12: 12:00:00 via INTRAVENOUS

## 2015-05-12 MED ORDER — LIDOCAINE HCL (CARDIAC) 20 MG/ML IV SOLN
INTRAVENOUS | Status: DC | PRN
  Administered 2015-05-12: 100 mg via INTRAVENOUS

## 2015-05-12 MED ORDER — HYDROMORPHONE HCL 1 MG/ML IJ SOLN
0.2500 mg | INTRAMUSCULAR | Status: DC | PRN
  Administered 2015-05-12 (×2): 0.25 mg via INTRAVENOUS
  Administered 2015-05-12 (×2): 0.5 mg via INTRAVENOUS
  Administered 2015-05-12 (×2): 0.25 mg via INTRAVENOUS

## 2015-05-12 MED ORDER — ONDANSETRON HCL 4 MG/2ML IJ SOLN
INTRAMUSCULAR | Status: DC | PRN
  Administered 2015-05-12: 4 mg via INTRAVENOUS

## 2015-05-12 MED ORDER — SODIUM CHLORIDE 0.9 % IV SOLN
INTRAVENOUS | Status: DC | PRN
  Administered 2015-05-12: 15:00:00 via INTRAVENOUS

## 2015-05-12 MED ORDER — DEXAMETHASONE SODIUM PHOSPHATE 4 MG/ML IJ SOLN
8.0000 mg | Freq: Once | INTRAMUSCULAR | Status: DC | PRN
  Filled 2015-05-12: qty 2

## 2015-05-12 MED ORDER — FENTANYL CITRATE (PF) 100 MCG/2ML IJ SOLN
INTRAMUSCULAR | Status: DC | PRN
  Administered 2015-05-12: 50 ug via INTRAVENOUS
  Administered 2015-05-12: 100 ug via INTRAVENOUS
  Administered 2015-05-12: 50 ug via INTRAVENOUS

## 2015-05-12 SURGICAL SUPPLY — 33 items
BRUSH SCRUB 4% CHG (MISCELLANEOUS) ×1 IMPLANT
CANISTER SUCT 1200ML W/VALVE (MISCELLANEOUS) ×3 IMPLANT
CHLORAPREP W/TINT 26ML (MISCELLANEOUS) ×1 IMPLANT
DRAPE IMP U-DRAPE 54X76 (DRAPES) ×2 IMPLANT
DRAPE INCISE IOBAN 66X45 STRL (DRAPES) ×3 IMPLANT
DRAPE SPLIT 77X100IN (DRAPES) ×2 IMPLANT
DRSG VAC ATS MED SENSATRAC (GAUZE/BANDAGES/DRESSINGS) ×3 IMPLANT
ELECT CAUTERY BLADE 6.4 (BLADE) ×3 IMPLANT
GLOVE BIO SURGEON STRL SZ7 (GLOVE) ×7 IMPLANT
GOWN STRL REUS W/ TWL LRG LVL3 (GOWN DISPOSABLE) ×1 IMPLANT
GOWN STRL REUS W/ TWL XL LVL3 (GOWN DISPOSABLE) ×1 IMPLANT
GOWN STRL REUS W/TWL LRG LVL3 (GOWN DISPOSABLE) ×6
GOWN STRL REUS W/TWL XL LVL3 (GOWN DISPOSABLE) ×3
HANDPIECE SUCTION TUBG SURGILV (MISCELLANEOUS) ×1 IMPLANT
IV NS 1000ML (IV SOLUTION)
IV NS 1000ML BAXH (IV SOLUTION) ×1 IMPLANT
KIT RM TURNOVER STRD PROC AR (KITS) ×3 IMPLANT
LABEL OR SOLS (LABEL) ×1 IMPLANT
NS IRRIG 500ML POUR BTL (IV SOLUTION) ×3 IMPLANT
PACK EXTREMITY ARMC (MISCELLANEOUS) ×3 IMPLANT
PAD GROUND ADULT SPLIT (MISCELLANEOUS) ×3 IMPLANT
PAD PREP 24X41 OB/GYN DISP (PERSONAL CARE ITEMS) ×3 IMPLANT
SOL PREP PVP 2OZ (MISCELLANEOUS) ×3
SOLUTION PREP PVP 2OZ (MISCELLANEOUS) ×1 IMPLANT
SPONGE LAP 18X18 5 PK (GAUZE/BANDAGES/DRESSINGS) ×3 IMPLANT
SUT ETHILON 4-0 (SUTURE)
SUT ETHILON 4-0 FS2 18XMFL BLK (SUTURE)
SUT VIC AB 3-0 SH 27 (SUTURE)
SUT VIC AB 3-0 SH 27X BRD (SUTURE) ×2 IMPLANT
SUTURE ETHLN 4-0 FS2 18XMF BLK (SUTURE) ×1 IMPLANT
SWAB CULTURE AMIES ANAERIB BLU (MISCELLANEOUS) ×1 IMPLANT
SYR BULB EAR ULCER 3OZ GRN STR (SYRINGE) ×1 IMPLANT
WND VAC CANISTER 500ML (MISCELLANEOUS) ×3 IMPLANT

## 2015-05-12 NOTE — Clinical Social Work Note (Signed)
CSW has noted that PT is recommending home health however, patient is to have a procedure today that will result in the placement of a wound vac more than likely. Patient is agreeable to go to a facility for wound vac management along with PT and is aware that [facility] is able to offer. CSW will follow up with [facility] after patient's procedure today regarding the wound vac. Shela Leff MSW,LCSW [phone]

## 2015-05-12 NOTE — Op Note (Signed)
    OPERATIVE NOTE   PROCEDURE: 1. Irrigation and debridement of skin, soft tissue, and muscle BLE for about 200 cm2 with biopsy and VAC dressing placement  PRE-OPERATIVE DIAGNOSIS: Nonviable tissue and infection of BLE wounds  POST-OPERATIVE DIAGNOSIS: Same as above  SURGEON: Leotis Pain, MD  ASSISTANT(S): none  ANESTHESIA: general  ESTIMATED BLOOD LOSS: 25 cc  FINDING(S): None  SPECIMEN(S):  Biopsy sent from each side  INDICATIONS:   [name] is a 81 y.o. female who presents with necrotic wounds of both lower extremities.  These have not healed despite appropriate wound care.  They are very painful.  Arterial and venous disease has been ruled out.  She needs these debrided and primary service requested a biopsy as well.  Risks and benefits were discussed and informed consent was obtained.  DESCRIPTION: After obtaining full informed written consent, the patient was brought back to the operating room and placed supine upon the operating table.  The patient received IV antibiotics prior to induction.  After obtaining adequate anesthesia, the patient was prepped and draped in the standard fashion.  The wounds were then opened and excisional debridement was performed to the skin, soft tissue and muscle on both legs to remove all clearly non-viable tissue. There was thick dead eschar over most of the wound and a layer of unhealthy tissue below this. The tissue was taken back to bleeding tissue that appeared viable.  The debridement was performed with Metzenbaum scissors and encompassed an area of approximately 200 cm2. The right leg wound was slightly larger with about 15 cm in length and 12 cm in width at its largest dimensions.  The left leg had two wounds that were in close proximity.  The larger was about 12 cm in length and about 7 cm in width at its largest dimension and the smaller one was about 10 cm in length and about 4 cm in width in its largest dimension.  The wound was  irrigated copiously with pulse lavage saline.  After all clearly non-viable tissue was removed, I used the entirety of two medium VAC sponges cut in configurations to fit both leg's wounds and used strips of Ioban for an occlusive seal. The patient was then awakened from anesthesia and taken to the recovery room in stable condition having tolerated the procedure well.  COMPLICATIONS: none  CONDITION: stable  [name]  05/12/2015, 3:29 PM

## 2015-05-12 NOTE — Anesthesia Postprocedure Evaluation (Signed)
  Anesthesia Post-op Note  Patient: [name]  Procedure(s) Performed: Procedure(s): IRRIGATION AND DEBRIDEMENT EXTREMITY (Bilateral)  Anesthesia type:General  Patient location: PACU  Post pain: Pain level controlled  Post assessment: Post-op Vital signs reviewed, Patient's Cardiovascular Status Stable, Respiratory Function Stable, Patent Airway and No signs of Nausea or vomiting  Post vital signs: Reviewed and stable  Last Vitals:  Filed Vitals:   05/12/15 1700  BP: 123/66  Pulse: 90  Temp: 36.9 C  Resp: 20    Level of consciousness: awake, alert  and patient cooperative  Complications: No apparent anesthesia complications

## 2015-05-12 NOTE — Telephone Encounter (Signed)
Pt is at [facility] and is due to have a test done at 1230 and would like a call before then. [name] states [name] has been waiting for Dr Silvio Pate to get in touch with her. Please call her room at [phone] Thank you

## 2015-05-12 NOTE — Progress Notes (Signed)
PT Cancellation Note  Patient Details Name: [name] MRN: [id number] DOB: Aug 28, 1926   Cancelled Treatment:    Reason Eval/Treat Not Completed: Patient at procedure or test/unavailable. Treatment attempted; pt just leaving for a procedure. Anticipate needing to see pt tomorrow.    Erline Levine Bishop 05/12/2015, 1:22 PM

## 2015-05-12 NOTE — Care Management (Signed)
RNCM will continue to follow this patient for home health needs. Surgery today per RN.

## 2015-05-12 NOTE — Anesthesia Procedure Notes (Signed)
Procedure Name: LMA Insertion Date/Time: 05/12/2015 2:43 PM Performed by: Aline Brochure Pre-anesthesia Checklist: Patient identified, Emergency Drugs available, Suction available and Patient being monitored Patient Re-evaluated:Patient Re-evaluated prior to inductionOxygen Delivery Method: Circle system utilized Preoxygenation: Pre-oxygenation with 100% oxygen Intubation Type: IV induction Ventilation: Mask ventilation without difficulty LMA: LMA inserted LMA Size: 3.5 Number of attempts: 1 Airway Equipment and Method: Patient positioned with wedge pillow Placement Confirmation: positive ETCO2 and breath sounds checked- equal and bilateral Tube secured with: Tape Dental Injury: Teeth and Oropharynx as per pre-operative assessment

## 2015-05-12 NOTE — Progress Notes (Signed)
Patient scheduled for biopsy and debridement of both lower extremities today just as my note stated Saturday and the internal medicine notes stated yesterday and TODAY. No changes in plan of care.

## 2015-05-12 NOTE — Anesthesia Preprocedure Evaluation (Addendum)
Anesthesia Evaluation  Patient identified by MRN, date of birth, ID band Patient awake    Reviewed: Allergy & Precautions, NPO status , Patient's Chart, lab work & pertinent test results, reviewed documented beta blocker date and time   Airway Mallampati: II  TM Distance: >3 FB Neck ROM: Limited    Dental  (+) Partial Upper   Pulmonary COPD, former smoker,    Pulmonary exam normal        Cardiovascular Exercise Tolerance: Poor + Peripheral Vascular Disease  + dysrhythmias Atrial Fibrillation  Rhythm:Irregular Rate:Normal  Afib, and did not do well with a number of blood thinners (coumadin among them). Stable now on Eliquis.   Neuro/Psych    GI/Hepatic Prior GI bleed from an AVM while anticoagulated.   Endo/Other  Hypothyroidism On thyroid replacement but still has an elevated TSH.  Renal/GU Renal InsufficiencyRenal disease     Musculoskeletal  (+) Arthritis , Rheumatoid disorders,    Abdominal (+)  Abdomen: soft.    Peds  Hematology  (+) anemia , Hb 7, WBC 30.8, plts 414.   Anesthesia Other Findings MRSA +.  Reproductive/Obstetrics                           Anesthesia Physical Anesthesia Plan  ASA: IV and emergent  Anesthesia Plan: General   Post-op Pain Management:    Induction: Intravenous  Airway Management Planned: LMA  Additional Equipment:   Intra-op Plan:   Post-operative Plan: Extubation in OR  Informed Consent: I have reviewed the patients History and Physical, chart, labs and discussed the procedure including the risks, benefits and alternatives for the proposed anesthesia with the patient or authorized representative who has indicated his/her understanding and acceptance.     Plan Discussed with: CRNA  Anesthesia Plan Comments:        Anesthesia Quick Evaluation

## 2015-05-12 NOTE — Telephone Encounter (Signed)
Going for biopsy and debridement.] Concerned about follow up wound care---husband can't manage. Will need home health nursing for this\  Discussed with her as they are prepping her for the procedure

## 2015-05-12 NOTE — Transfer of Care (Signed)
Immediate Anesthesia Transfer of Care Note  Patient: [name]  Procedure(s) Performed: Procedure(s): IRRIGATION AND DEBRIDEMENT EXTREMITY (Bilateral)  Patient Location: PACU  Anesthesia Type:General  Level of Consciousness: awake and alert   Airway & Oxygen Therapy: Patient Spontanous Breathing and Patient connected to face mask oxygen  Post-op Assessment: Report given to RN and Post -op Vital signs reviewed and stable  Post vital signs: stable  Last Vitals:  Filed Vitals:   05/12/15 1541  BP: 102/71  Pulse: 76  Temp: 37.3 C  Resp: 18    Complications: No apparent anesthesia complications

## 2015-05-12 NOTE — Progress Notes (Addendum)
[place] [facility] [place] NAME: [name]    MR#:  [id number]  DATE OF BIRTH:  08-14-1926  SUBJECTIVE:  CHIEF COMPLAINT:   Chief Complaint  Patient presents with  . Wound Infection   Complains of bilateral leg pain. Concerned about lack of mobility. Anxious about voting tomorrow  REVIEW OF SYSTEMS:   Review of Systems  Constitutional: Negative for fever and chills.  Respiratory: Negative for shortness of breath.   Cardiovascular: Negative for chest pain and palpitations.  Gastrointestinal: Negative for nausea, vomiting and abdominal pain.  Genitourinary: Negative for dysuria.  Skin: Positive for rash.   DRUG ALLERGIES:   Allergies  Allergen Reactions  . Dabigatran Etexilate Mesylate Shortness Of Breath  . Coumadin [Warfarin Sodium] Other (See Comments)    hematoma  . Doxycycline Hyclate [Doxycycline] Itching  . Penicillins     Per pt anaphylactic reaction, years ago at age 20 (in college)  . Pradaxa [Dabigatran Etexilate Mesylate] Other (See Comments)    SLEEP  . Sulfonamide Derivatives   . Tape Other (See Comments)    blisters  . Tramadol Hcl     Itching     VITALS:  Blood pressure 115/49, pulse 96, temperature 98.4 F (36.9 C), temperature source Oral, resp. rate 18, height 5' 5.5" (1.664 m), weight 50.349 kg (111 lb), SpO2 96 %.  PHYSICAL EXAMINATION:  GENERAL:  81 y.o.-year-old patient sitting up eating breakfast with no acute distress.  LUNGS: Normal breath sounds bilaterally, no wheezing, rales,rhonchi or crepitation. No use of accessory muscles of respiration.  CARDIOVASCULAR: S1, S2 normal. No murmurs, rubs, or gallops.  ABDOMEN: Soft, nontender, nondistended. Bowel sounds present. No organomegaly or mass.  EXTREMITIES: No edema, bluish discoloration bilateral lower extremities, black eschar over anterior lower extremity ulcers, no drainage, no erythema  NEUROLOGIC: Cranial nerves II through XII are intact.  Muscle strength 5/5 in all extremities. Sensation intact. PSYCHIATRIC: The patient is alert and oriented x 3.  SKIN: See above description of extremities  LABORATORY PANEL:   CBC  Recent Labs Lab 05/12/15 0309  WBC 30.8*  HGB 7.0*  HCT 21.7*  PLT 414   ------------------------------------------------------------------------------------------------------------------  Chemistries   Recent Labs Lab 05/12/15 0309  NA 134*  K 4.5  CL 103  CO2 27  GLUCOSE 95  BUN 22*  CREATININE 0.92  CALCIUM 8.1*   ------------------------------------------------------------------------------------------------------------------  Cardiac Enzymes No results for input(s): TROPONINI in the last 168 hours. ------------------------------------------------------------------------------------------------------------------  RADIOLOGY:  No results found.  EKG:   Orders placed or performed in visit on 07/29/14  . EKG 12-Lead    ASSESSMENT AND PLAN:  81 year old female patient with chronic atrial fibrillation on eliquis, rheumatoid arthritis on methotrexate, Plaquenil, history of GI bleed with av malformations comes to the emergency room secondary to bilateral lower extremity necrotic ulcers.. Patient is followed by wound care, was sent to rheumatology office yesterday for emergency evaluation. Dr. Myna Hidalgo send her to the emergency room for evaluation. noted pain in both legs 3 weeks ago , developed ulcers which continued to enlarge. Patient was seen by wound care started on topical. Noted to have very painful ulcers.in Both legs Did not have any blisters.    #1 bilateral lower extremity ulcerations and necrosis - Rheumatologic labs pending, have discussed with Dr. Jeanie Sewer - Culture showing Pseudomonas sensitive to ciprofloxacin  - Aortogram showing no significant stenoses of either leg - Eliquis not known to cause ulceration or tissue necrosis, this will be resumed on  dc - Plan is for  biopsy and debridement his afternoon by Dr. dew  #2 chronic atrial fibrillation - Appreciate cardiology consultation - Rate is controlled, continue atenolol and resume eliquis on dc  #3 GERD and history of gastrointestinal AVM - Continue PPI  #4 hypothyroidism -  TSH is slightly high, continue Synthroid  #5 acute renal failure - Resolved  #6 leg pain and weakness - Physical therapy recommendation is for home health physical therapy versus skilled nursing. As after this surgery she will have a wound VAC in place and will need additional support I will discharge her to skilled nursing tomorrow.  #7 anemia - Has had progressive anemia during this hospitalization.  stool guaiac. -Anemia panel suggestive of AOCD. B12 and folate are normal. - Will recheck CBC after surgery and transfuse if needed  #8 leukocytosis - Large increase in white blood cell count this morning no fever or new symptoms - get UA and chest x-ray as well as blood cultures  All the records are reviewed and case discussed with Care Management/Social Workerr. Management plans discussed with the patient, and they are in agreement.   The patient would very much like to be discharged in time to vote on Tuesday  CODE STATUS: Full  TOTAL TIME TAKING CARE OF THIS PATIENT: 30 minutes.  Greater than 50% of time spent in care coordination and counseling. POSSIBLE D/C IN 1-2 DAYS, DEPENDING ON CLINICAL CONDITION.   [name] M.D on 05/12/2015 at 1:59 PM  Between 7am to 6pm - Pager - [phone]  After 6pm go to [email or web address] - password [email or web address] [facility]  Office  [phone]  CC: Primary care physician; Viviana Simpler, MD

## 2015-05-13 ENCOUNTER — Encounter: Payer: Self-pay | Admitting: Vascular Surgery

## 2015-05-13 ENCOUNTER — Inpatient Hospital Stay: Payer: Medicare Other

## 2015-05-13 DIAGNOSIS — I482 Chronic atrial fibrillation: Secondary | ICD-10-CM

## 2015-05-13 DIAGNOSIS — L03115 Cellulitis of right lower limb: Secondary | ICD-10-CM | POA: Diagnosis not present

## 2015-05-13 DIAGNOSIS — L03116 Cellulitis of left lower limb: Secondary | ICD-10-CM

## 2015-05-13 DIAGNOSIS — M549 Dorsalgia, unspecified: Secondary | ICD-10-CM

## 2015-05-13 DIAGNOSIS — L97911 Non-pressure chronic ulcer of unspecified part of right lower leg limited to breakdown of skin: Secondary | ICD-10-CM

## 2015-05-13 DIAGNOSIS — L97921 Non-pressure chronic ulcer of unspecified part of left lower leg limited to breakdown of skin: Secondary | ICD-10-CM | POA: Diagnosis not present

## 2015-05-13 DIAGNOSIS — M069 Rheumatoid arthritis, unspecified: Secondary | ICD-10-CM

## 2015-05-13 LAB — BASIC METABOLIC PANEL
ANION GAP: 5 (ref 5–15)
BUN: 24 mg/dL — ABNORMAL HIGH (ref 6–20)
CALCIUM: 8 mg/dL — AB (ref 8.9–10.3)
CHLORIDE: 105 mmol/L (ref 101–111)
CO2: 25 mmol/L (ref 22–32)
Creatinine, Ser: 0.99 mg/dL (ref 0.44–1.00)
GFR calc non Af Amer: 50 mL/min — ABNORMAL LOW (ref >60)
GFR, EST AFRICAN AMERICAN: 58 mL/min — AB (ref >60)
Glucose, Bld: 93 mg/dL (ref 65–99)
POTASSIUM: 4.7 mmol/L (ref 3.5–5.1)
Sodium: 135 mmol/L (ref 135–145)

## 2015-05-13 LAB — CULTURE, BLOOD (ROUTINE X 2)
CULTURE: NO GROWTH
Culture: NO GROWTH

## 2015-05-13 LAB — CBC
HCT: 22.4 % — ABNORMAL LOW (ref 35.0–47.0)
Hemoglobin: 7.1 g/dL — ABNORMAL LOW (ref 12.0–16.0)
MCH: 27 pg (ref 26.0–34.0)
MCHC: 31.6 g/dL — ABNORMAL LOW (ref 32.0–36.0)
MCV: 85.5 fL (ref 80.0–100.0)
PLATELETS: 442 10*3/uL — AB (ref 150–440)
RBC: 2.62 MIL/uL — AB (ref 3.80–5.20)
RDW: 18.8 % — ABNORMAL HIGH (ref 11.5–14.5)
WBC: 35.5 10*3/uL — AB (ref 3.6–11.0)

## 2015-05-13 MED ORDER — DOXYCYCLINE HYCLATE 100 MG PO CAPS: 100.0000 mg | ORAL_CAPSULE | Freq: Two times a day (BID) | ORAL | Status: DC

## 2015-05-13 MED ORDER — OXYCODONE HCL 15 MG PO TABS: 15.0000 mg | ORAL_TABLET | Freq: Four times a day (QID) | ORAL | Status: DC | PRN

## 2015-05-13 MED ORDER — ENSURE ENLIVE PO LIQD: 237.0000 mL | Freq: Two times a day (BID) | ORAL | Status: DC

## 2015-05-13 MED ORDER — CIPROFLOXACIN HCL 500 MG PO TABS: 500.0000 mg | ORAL_TABLET | Freq: Two times a day (BID) | ORAL | Status: DC

## 2015-05-13 MED ORDER — OXYCODONE HCL ER 10 MG PO T12A: 10.0000 mg | EXTENDED_RELEASE_TABLET | Freq: Two times a day (BID) | ORAL | Status: DC

## 2015-05-13 MED ORDER — DEXTROSE 5 % IV SOLN
500.0000 mg | Freq: Three times a day (TID) | INTRAVENOUS | Status: DC
  Administered 2015-05-13: 500 mg via INTRAVENOUS
  Filled 2015-05-13 (×3): qty 0.5

## 2015-05-13 MED ORDER — SODIUM CHLORIDE 0.9 % IV SOLN: INTRAVENOUS | Status: DC

## 2015-05-13 NOTE — Clinical Social Work Note (Signed)
Patient cleared for discharge to go to [facility] today for wound vac therapy and physical therapy. Patient is in agreement and is happy about getting to vote beforehand. Family is here to transport patient so she can do curbside voting and then go to [facility] to complete her course of wound therapy and physical therapy. Discharge information sent to Seth Bake at [facility]. Nurse to call report. Shela Leff MSW,LCSW [phone]

## 2015-05-13 NOTE — Discharge Summary (Signed)
[place] at [place] NAME: [name]    MR#:  [id number]  DATE OF BIRTH:  1927-02-05  DATE OF ADMISSION:  05/08/2015 ADMITTING PHYSICIAN: Fritzi Mandes, MD  DATE OF DISCHARGE: 05/13/2015  PRIMARY CARE PHYSICIAN: Viviana Simpler, MD    ADMISSION DIAGNOSIS:  Ulcers of both lower extremities, limited to breakdown of skin ([place]) [L97.911, L97.921]  DISCHARGE DIAGNOSIS:  Active Problems:   Bilateral leg ulcer ([place])   Ulcers of both lower extremities, limited to breakdown of skin ([place])   SECONDARY DIAGNOSIS:   Past Medical History  Diagnosis Date  . Arthritis   . Hyperlipidemia   . Hypothyroidism   . Atrial fibrillation ([place])     a. s/p prior DCCV's, now persistent-->Xarelto (CHA2DS2VASc = 3).  . Diverticulosis     a. by colonoscopy, h/o colon adenoma ([place])  . Gastritis and duodenitis     a. 01/2014 EGD: Moderate non-erosive gastritis and mild non-erosive duodenitis.    HOSPITAL COURSE:   81 year old female patient with chronic atrial fibrillation on eliquis, rheumatoid arthritis on methotrexate, Plaquenil, history of GI bleed with av malformations comes to the emergency room secondary to bilateral lower extremity necrotic ulcers.. Patient is followed by wound care, was sent to rheumatology office yesterday for emergency evaluation. Dr. Myna Hidalgo send her to the emergency room for evaluation. noted pain in both legs 3 weeks ago , developed ulcers which continued to enlarge. Patient was seen by wound care started on topical. Noted to have very painful ulcers.in Both legs Did not have any blisters.   #1 bilateral lower extremity ulcerations and necrosis: Rheumatologic labs pending, have discussed with Dr. Jeanie Sewer. Ulcers are not typical of rheumatoid arthritis. She will follow up with rheumatology in 1 week to review lab results. Wound cultures have grown MRSA, Comamonas acidovorans, and pseudomonas  aeruginosa. She has been on Azactam and ciprofloxacin during this hospitalization, which was simplified to ciprofloxacin only when all cultures resulted sensitive to ciprofloxacin. However her white blood cell count has continued to escalate so I am adding doxycycline today. She will need to continue an additional 10 days of antibiotics. Blood cultures are negative, repeat cultures are pending. Aortogram showing no significant stenoses of either leg. Eliquis not known to cause ulceration or tissue necrosis, this will be resumed. She had debridement of both lower leg ulcers and biopsy taken on 11/7 by Dr. dew. She reports significant pain relief after this procedure. Surgical report is of necrotic tissue overlying fairly healthy tissue. No report of purulent material or abscess. She will follow up with Dr. dew in clinic in 2 weeks. She will have a wound VAC in place at skilled nursing to help with healing. Biopsy report pending.  #2 chronic atrial fibrillation: Rate is controlled, continue atenolol and resume eliquis on dc  #3 GERD and history of gastrointestinal AVM: Continue PPI  #4 hypothyroidism: TSH is slightly high possibly due to acute illness, continue Synthroid. Recheck in 1 month  #5 acute renal failure: Resolved. Renal function back to baseline  #6 leg pain and weakness: She continues on narcotic pain medications to control this pain. She has been working with physical therapy. Due to her weakness and addition of food VAC she will need skilled nursing upon discharge.  #7 anemia: Has had progressive anemia during this hospitalization. Anemia panel suggestive of AOCD. B12 and folate are normal. She has not required transfusion but CBC should be checked to ensure that hemoglobin does  not get below 7.  #8 leukocytosis: Likely due to wound inflammation and infection. White blood cell count is trending downwards prior to surgery and then increasing likely due to surgery. No signs of other  infection: Urine has been clear. No cough congestion or shortness of breath. Blood cultures pending from 11/7. She will need to continue on doxycycline and ciprofloxacin. White count will need to be monitored closely.  DISCHARGE CONDITIONS:   Fair  CONSULTS OBTAINED:  Treatment Team:  Wellington Hampshire, MD Algernon Huxley, MD Adrian Prows, MD Minna Merritts, MD  DRUG ALLERGIES:   Allergies  Allergen Reactions  . Dabigatran Etexilate Mesylate Shortness Of Breath  . Coumadin [Warfarin Sodium] Other (See Comments)    hematoma  . Doxycycline Hyclate [Doxycycline] Itching  . Penicillins     Per pt anaphylactic reaction, years ago at age 74 (in college)  . Pradaxa [Dabigatran Etexilate Mesylate] Other (See Comments)    SLEEP  . Sulfonamide Derivatives   . Tape Other (See Comments)    blisters  . Tramadol Hcl     Itching     DISCHARGE MEDICATIONS:   Current Discharge Medication List    START taking these medications   Details  ciprofloxacin (CIPRO) 500 MG tablet Take 1 tablet (500 mg total) by mouth 2 (two) times daily. Qty: 20 tablet, Refills: 0    feeding supplement, ENSURE ENLIVE, (ENSURE ENLIVE) LIQD Take 237 mLs by mouth 2 (two) times daily between meals. Qty: 237 mL, Refills: 12      CONTINUE these medications which have CHANGED   Details  oxyCODONE (OXYCONTIN) 10 mg 12 hr tablet Take 1 tablet (10 mg total) by mouth every 12 (twelve) hours. Qty: 60 tablet, Refills: 0    oxyCODONE (ROXICODONE) 15 MG immediate release tablet Take 1 tablet (15 mg total) by mouth 4 (four) times daily as needed for pain. Qty: 30 tablet, Refills: 0      CONTINUE these medications which have NOT CHANGED   Details  acetaminophen (TYLENOL) 650 MG CR tablet Take 650 mg by mouth every 8 (eight) hours as needed for pain.    acyclovir ointment (ZOVIRAX) 5 % Apply 1 application topically every 2 (two) hours as needed. Qty: 30 g, Refills: 2    apixaban (ELIQUIS) 2.5 MG TABS tablet Take 1  tablet (2.5 mg total) by mouth 2 (two) times daily. Qty: 60 tablet, Refills: 3    atenolol (TENORMIN) 25 MG tablet TAKE ONE TABLET BY MOUTH ONCE DAILY Qty: 90 tablet, Refills: 3    Biotin 5000 MCG CAPS Take 5,000 mcg by mouth daily.     Calcium Alginate (ALGICELL CALCIUM DRESSING 4"X4) MISC Apply 1 each topically daily. Qty: 10 each, Refills: 2    CALCIUM CARBONATE PO Take 1,200 mg by mouth daily.    CHONDROITIN SULFATE PO Take 1,200 mg by mouth daily.    cyanocobalamin 2000 MCG tablet Take 2,000 mcg by mouth daily.    docusate sodium (COLACE) 100 MG capsule Take 100 mg by mouth 2 (two) times daily.    Ferrous Sulfate (IRON) 325 (65 FE) MG TABS Take 325 mg by mouth daily.     folic acid (FOLVITE) 1 MG tablet Take 2 mg by mouth daily.     furosemide (LASIX) 40 MG tablet TAKE ONE TABLET BY MOUTH ONCE DAILY AS NEEDED Qty: 90 tablet, Refills: 3    Glucosamine HCl (GLUCOSAMINE PO) Take 1,500 mg by mouth daily.    hydroxychloroquine (PLAQUENIL) 200 MG tablet  Take 400 mg by mouth at bedtime.     levothyroxine (SYNTHROID, LEVOTHROID) 75 MCG tablet TAKE ONE TABLET BY MOUTH ONCE DAILY BEFORE BREAKFAST Qty: 90 tablet, Refills: 3    magnesium hydroxide (MILK OF MAGNESIA) 400 MG/5ML suspension Take 15 mLs by mouth daily as needed for mild constipation or moderate constipation.     methotrexate (RHEUMATREX) 2.5 MG tablet Take 12.5 mg by mouth once a week. On Wednesday    Multiple Vitamins-Minerals (ONE-A-DAY 50 PLUS PO) Take 1 tablet by mouth daily.     omeprazole (PRILOSEC) 40 MG capsule Take 40 mg by mouth daily.    polyethylene glycol (MIRALAX / GLYCOLAX) packet Take 17 g by mouth daily as needed for mild constipation, moderate constipation or severe constipation.         DISCHARGE INSTRUCTIONS:    #1 check CBC on 11/9 and daily afterwards to ensure white blood cell count trends downward and hemoglobin remained stable.  #2 heart healthy diet  #3 activity per physical  therapy recommendations at skilled nursing facility  #4 no supplemental oxygen need   If you experience worsening of your admission symptoms, develop shortness of breath, life threatening emergency, suicidal or homicidal thoughts you must seek medical attention immediately by calling 911 or calling your MD immediately  if symptoms less severe.  You Must read complete instructions/literature along with all the possible adverse reactions/side effects for all the Medicines you take and that have been prescribed to you. Take any new Medicines after you have completely understood and accept all the possible adverse reactions/side effects.   Please note  You were cared for by a hospitalist during your hospital stay. If you have any questions about your discharge medications or the care you received while you were in the hospital after you are discharged, you can call the unit and asked to speak with the hospitalist on call if the hospitalist that took care of you is not available. Once you are discharged, your primary care physician will handle any further medical issues. Please note that NO REFILLS for any discharge medications will be authorized once you are discharged, as it is imperative that you return to your primary care physician (or establish a relationship with a primary care physician if you do not have one) for your aftercare needs so that they can reassess your need for medications and monitor your lab values.    Today   CHIEF COMPLAINT:   Chief Complaint  Patient presents with  . Wound Infection    HISTORY OF PRESENT ILLNESS:  Crystale Giannattasio is a 81 y.o. female with a known history of chronic A. fib on eliquis, chronic venous insufficiency, rheumatoid arthritis on methotrexate and Plaquenil, hypothyroidism, history of GI bleed secondary to AVMs comes to the emergency room with bilateral lower extremity necrotic-looking ulcers. Patient has purplish hue over both her lower extremities  significant for the venous insufficiency she developed 2 ulcers on both the lower extremities about 3 weeks ago was followed up at one clinic however it progressed to the point now they are black/necrotic-looking ulcers. She was seen by Dr. Jefm Bryant sent to the emergency room for further evaluation management. The patient is complaining of pain in both lower extremities. She denies any fever or nausea or vomiting chest pain or abdominal pain. She received a dose of IV vancomycin  VITAL SIGNS:  Blood pressure 98/44, pulse 68, temperature 98.5 F (36.9 C), temperature source Oral, resp. rate 19, height 5' 5.5" (1.664 m), weight 50.349  kg (111 lb), SpO2 94 %.  I/O:   Intake/Output Summary (Last 24 hours) at 05/13/15 0742 Last data filed at 05/13/15 0600  Gross per 24 hour  Intake   1200 ml  Output    350 ml  Net    850 ml    PHYSICAL EXAMINATION:  GENERAL:  81 y.o.-year-old patient lying in the bed with no acute distress.  LUNGS: Normal breath sounds bilaterally, no wheezing, rales,rhonchi or crepitation. No use of accessory muscles of respiration.  CARDIOVASCULAR: S1, S2 normal. No murmurs, rubs, or gallops.  ABDOMEN: Soft, non-tender, non-distended. Bowel sounds present. No organomegaly or mass.  EXTREMITIES: No pedal edema, cyanosis, or clubbing.  NEUROLOGIC: Cranial nerves II through XII are intact. Muscle strength 5/5 in all extremities. Sensation intact. Gait not checked.  PSYCHIATRIC: The patient is alert and oriented x 3.  SKIN: Bilateral lower extremity wounds with wound VAC in place, dusky discoloration of both lower extremities  DATA REVIEW:   CBC  Recent Labs Lab 05/13/15 0405  WBC 35.5*  HGB 7.1*  HCT 22.4*  PLT 442*    Chemistries   Recent Labs Lab 05/13/15 0405  NA 135  K 4.7  CL 105  CO2 25  GLUCOSE 93  BUN 24*  CREATININE 0.99  CALCIUM 8.0*    Cardiac Enzymes No results for input(s): TROPONINI in the last 168 hours.  Microbiology Results   Results for orders placed or performed during the hospital encounter of 05/08/15  Blood culture (routine x 2)     Status: None (Preliminary result)   Collection Time: 05/08/15  1:26 PM  Result Value Ref Range Status   Specimen Description BLOOD LEFT ASSIST CONTROL  Final   Special Requests BOTTLES DRAWN AEROBIC AND ANAEROBIC 6CC  Final   Culture NO GROWTH 4 DAYS  Final   Report Status PENDING  Incomplete  Blood culture (routine x 2)     Status: None (Preliminary result)   Collection Time: 05/08/15  2:21 PM  Result Value Ref Range Status   Specimen Description BLOOD RIGHT ASSIST CONTROL  Final   Special Requests BOTTLES DRAWN AEROBIC AND ANAEROBIC 5CC  Final   Culture NO GROWTH 4 DAYS  Final   Report Status PENDING  Incomplete  Wound culture     Status: None   Collection Time: 05/08/15  6:02 PM  Result Value Ref Range Status   Specimen Description LEG  Final   Special Requests NONE  Final   Gram Stain RARE WBC SEEN FEW GRAM NEGATIVE RODS   Final   Culture HEAVY GROWTH PSEUDOMONAS AERUGINOSA  Final   Report Status 05/11/2015 FINAL  Final   Organism ID, Bacteria PSEUDOMONAS AERUGINOSA  Final      Susceptibility   Pseudomonas aeruginosa - MIC*    CEFTAZIDIME 4 SENSITIVE Sensitive     CIPROFLOXACIN <=0.25 SENSITIVE Sensitive     GENTAMICIN <=1 SENSITIVE Sensitive     IMIPENEM >=16 RESISTANT Resistant     PIP/TAZO Value in next row Sensitive      SENSITIVE8    * HEAVY GROWTH PSEUDOMONAS AERUGINOSA  MRSA PCR Screening     Status: None   Collection Time: 05/09/15 12:40 PM  Result Value Ref Range Status   MRSA by PCR NEGATIVE NEGATIVE Final    Comment:        The GeneXpert MRSA Assay (FDA approved for NASAL specimens only), is one component of a comprehensive MRSA colonization surveillance program. It is not intended to diagnose MRSA infection  nor to guide or monitor treatment for MRSA infections.     RADIOLOGY:  No results found.  EKG:   Orders placed or performed  in visit on 07/29/14  . EKG 12-Lead      Management plans discussed with the patient, family and they are in agreement.  CODE STATUS:     Code Status Orders        Start     Ordered   05/08/15 1745  Full code   Continuous     05/08/15 1744    Advance Directive Documentation        Most Recent Value   Type of Advance Directive  Living will   Pre-existing out of facility DNR order (yellow form or pink MOST form)     "MOST" Form in Place?        TOTAL TIME TAKING CARE OF THIS PATIENT: 40 minutes.  Greater than 50% of time spent in care coordination and counseling.  [name] M.D on 05/13/2015 at 7:42 AM  Between 7am to 6pm - Pager - [phone]  After 6pm go to [email or web address] - password [email or web address] [facility]  Office  [phone]  CC: Primary care physician; Viviana Simpler, MD

## 2015-05-13 NOTE — Progress Notes (Signed)
[facility]  Daily Progress Note  Subjective: 1 Day Post-Op: Irrigation and debridement of skin, soft tissue, and muscle BLE for about 200 cm2 with biopsy and VAC dressing placement  Patient without complaint this AM. States she is being discharged to vote then to nursing home. Explained how a [facility] works and that she will need changes every three days. Also, discussed follow up in our office.  Objective: Filed Vitals:   05/12/15 1736 05/12/15 1833 05/12/15 2014 05/13/15 0422  BP: 128/46 123/61 103/55 98/44  Pulse: 88 78 75 68  Temp: 97.4 F (36.3 C) 98.3 F (36.8 C) 99.1 F (37.3 C) 98.5 F (36.9 C)  TempSrc: Oral Oral Oral Oral  Resp: 18 18 17 19   Height:      Weight:      SpO2: 100% 97% 95% 94%    Intake/Output Summary (Last 24 hours) at 05/13/15 0756 Last data filed at 05/13/15 0600  Gross per 24 hour  Intake   1200 ml  Output    350 ml  Net    850 ml    Physical Exam: A&Ox3, NAD CV: RRR Pulmonary: CTA Bilaterally Abdomen: Soft, Nontender, Nondistended Vascular:  Right Leg: VAC intact to suction, no leak  Left Leg: VAC intact to suction, no leak   Laboratory: CBC    Component Value Date/Time   WBC 35.5* 05/13/2015 0405   WBC 5.8 03/07/2014 1430   HGB 7.1* 05/13/2015 0405   HGB 9.5* 12/26/2014 1439   HCT 22.4* 05/13/2015 0405   PLT 442* 05/13/2015 0405    BMET    Component Value Date/Time   NA 135 05/13/2015 0405   NA 140 03/07/2014 1430   K 4.7 05/13/2015 0405   CL 105 05/13/2015 0405   CO2 25 05/13/2015 0405   GLUCOSE 93 05/13/2015 0405   GLUCOSE 95 03/07/2014 1430   BUN 24* 05/13/2015 0405   BUN 27 03/07/2014 1430   CREATININE 0.99 05/13/2015 0405   CALCIUM 8.0* 05/13/2015 0405   GFRNONAA 50* 05/13/2015 0405   GFRAA 58* 05/13/2015 0405    Assessment/Planning: 81 year old female s/p 1 Day Post-Op: Irrigation and debridement of skin, soft tissue, and muscle BLE for about 200 cm2 with biopsy and VAC dressing placement -  doing well 1) As per patient being discharged today to nursing home.  2) Patient to follow up in our office in 2-3 weeks. 3) VAC changes every three days.  Marcelle Overlie PA-C 05/13/2015 7:56 AM

## 2015-05-13 NOTE — Discharge Instructions (Signed)
VAC changes with black foam every three days. 125, continuous, high intensity settings.   See discharge summary.

## 2015-05-14 ENCOUNTER — Telehealth: Payer: Self-pay | Admitting: *Deleted

## 2015-05-14 ENCOUNTER — Ambulatory Visit: Payer: Medicare Other | Admitting: Surgery

## 2015-05-14 NOTE — Telephone Encounter (Signed)
Telephone call for transitional care.  Patient was discharged from [facility] 11/8 and admitted to [facility] for rehab.  Per husband, she is still in a lot of pain and not improving well.  Length of stay is unknown at this time.  Will plan to follow up once discharged to home.

## 2015-05-14 NOTE — Telephone Encounter (Signed)
I saw her yesterday at [facility] and will continue to follow her there

## 2015-05-15 LAB — SURGICAL PATHOLOGY

## 2015-05-17 LAB — CULTURE, BLOOD (ROUTINE X 2)
CULTURE: NO GROWTH
Culture: NO GROWTH

## 2015-05-19 DIAGNOSIS — L97221 Non-pressure chronic ulcer of left calf limited to breakdown of skin: Secondary | ICD-10-CM | POA: Diagnosis not present

## 2015-05-19 DIAGNOSIS — L97211 Non-pressure chronic ulcer of right calf limited to breakdown of skin: Secondary | ICD-10-CM | POA: Diagnosis not present

## 2015-05-20 ENCOUNTER — Ambulatory Visit: Payer: Medicare Other | Admitting: Internal Medicine

## 2015-05-22 LAB — CARDIOLIPIN ANTIBODIES, IGM+IGG

## 2015-05-22 LAB — IMMUNOFIXATION ELECT: CRYOGLOBULIN %: 3 % — AB

## 2015-05-22 LAB — CRYOGLOBULIN

## 2015-05-22 LAB — LUPUS ANTICOAGULANT PANEL
DRVVT: 52.7 s — ABNORMAL HIGH (ref 0.0–44.0)
PTT Lupus Anticoagulant: 33.4 s (ref 0.0–40.6)

## 2015-05-22 LAB — DRVVT MIX: DRVVT MIX: 45 s — AB (ref 0.0–44.0)

## 2015-05-22 LAB — DRVVT CONFIRM: dRVVT Confirm: 0.8 ratio (ref 0.8–1.2)

## 2015-05-28 DIAGNOSIS — R609 Edema, unspecified: Secondary | ICD-10-CM | POA: Diagnosis not present

## 2015-06-02 ENCOUNTER — Telehealth: Payer: Self-pay | Admitting: *Deleted

## 2015-06-02 NOTE — Telephone Encounter (Signed)
[facility] Patient Name: DEANDRA BRULL Gender: Female DOB: 06-26-1927 Age: 81 Y 67 M 19 D Return Phone Number: Address: City/State/Zip: [place] Client [place] Night - Client Client Site [place] Physician Viviana Simpler Contact Type Call Call Type Page Only Caller Name [facility] Relationship To Patient Other Is this call to report lab results? No Return Phone Number Please choose phone number Initial Comment Caller Colletta [place] from [facility] states she needs to speak with the OC. The PT has Pitting Edema (worsening) CB# [phone] Nurse Assessment Guidelines Guideline Title Affirmed Question Affirmed Notes Nurse Date/Time (Eastern Time) Disp. Time Eilene Ghazi Time) Disposition Final User 06/01/2015 7:21:52 PM Send to [place], Eric 06/01/2015 7:25:05 PM Paged On Call back to Call [place] Renato Shin 06/01/2015 7:26:04 PM Paged On Call back to Call [place] Renato Shin 06/01/2015 7:35:08 PM Page Completed Yes Renato Shin After Care Instructions Given Call Event Type User Date / Time Description Paging [facility] Phone DateTime Result/Outcome Message Type Notes Colin Benton [id number] 06/01/2015 7:25:05 PM Paged On Call Back to Call Center Doctor Paged Please call Eada with [facility] at [phone]. Colin Benton 06/01/2015 7:35:28 PM Spoke with On Call - General Message Result Connected on call with caller.

## 2015-06-02 NOTE — Telephone Encounter (Signed)
This edema in chronic Did get her prn lasix Seen this morning at [facility]

## 2015-06-03 ENCOUNTER — Telehealth: Payer: Self-pay | Admitting: Internal Medicine

## 2015-06-03 NOTE — Telephone Encounter (Signed)
Pt would like an appt with dr Jefm Bryant and needs to have that coordinated. She is at [facility] and doesnt know how to get there since she is at [facility] and possibly needs a referral  Thank you  Please call cell back [phone]

## 2015-06-04 ENCOUNTER — Telehealth: Payer: Self-pay

## 2015-06-04 NOTE — Telephone Encounter (Signed)
[name] healthcare POA; said pt seems more aggravated, anxious and "mean". [name] request cb by Dr Silvio Pate. [name] said health care POA on file at [facility].[name] has also left message at [facility] for Dr Silvio Pate to call her. [name] understands Dr Silvio Pate is not in office today.

## 2015-06-04 NOTE — Telephone Encounter (Signed)
Discussed with nurse at [facility] will set up the appt

## 2015-06-05 NOTE — Telephone Encounter (Signed)
I spoke to her via phone at length today from [facility]

## 2015-06-09 DIAGNOSIS — L03116 Cellulitis of left lower limb: Secondary | ICD-10-CM | POA: Diagnosis not present

## 2015-06-12 DIAGNOSIS — I83029 Varicose veins of left lower extremity with ulcer of unspecified site: Secondary | ICD-10-CM

## 2015-06-12 DIAGNOSIS — I482 Chronic atrial fibrillation: Secondary | ICD-10-CM | POA: Diagnosis not present

## 2015-06-12 DIAGNOSIS — I83019 Varicose veins of right lower extremity with ulcer of unspecified site: Secondary | ICD-10-CM | POA: Diagnosis not present

## 2015-06-12 DIAGNOSIS — M069 Rheumatoid arthritis, unspecified: Secondary | ICD-10-CM

## 2015-06-12 DIAGNOSIS — F329 Major depressive disorder, single episode, unspecified: Secondary | ICD-10-CM

## 2015-06-12 DIAGNOSIS — L03116 Cellulitis of left lower limb: Secondary | ICD-10-CM | POA: Diagnosis not present

## 2015-06-14 ENCOUNTER — Telehealth: Payer: Self-pay | Admitting: Family Medicine

## 2015-06-14 NOTE — Telephone Encounter (Signed)
Received call from team [facility] regarding this patient. They state that the patient's nursing facility called asking for an order for the patient remained in her Unna boots. [facility] nurse notes that the patient saw the vascular surgeon yesterday for her venous insufficiency and venous stasis ulcers and was placed in Unna boots in the office. He states that the recommendation from the vascular surgeon was for the patient to be in the Unna boots for a week. There is no written order stating this. The nursing facility would like a written order with recommendations guarding this. The team [facility] nurse notes they're not having any issues with Unna boots or the patient's legs. He states they just want an order clarifying what needs to happen with the Unna boots. I advised that since the vascular surgeon recommended she be in the Unna boots for a week that the order should reflect this. I gave a verbal order stating that she should remain in the Unna boots for a week. I will forward the message to the patient's PCP so that on Monday a further course of action can be determined.

## 2015-06-15 ENCOUNTER — Telehealth: Payer: Self-pay | Admitting: Family Medicine

## 2015-06-15 MED ORDER — NYSTATIN 100000 UNIT/ML MT SUSP: 5.0000 mL | Freq: Four times a day (QID) | OROMUCOSAL | Status: DC

## 2015-06-15 NOTE — Telephone Encounter (Signed)
Received call from [facility]. They state that the patient's nursing facility called and reported the patient had thrush. They connected me to speak with the nurse. Spoke with the patient's nurse. She reports the patient complained of sore mouth and throat is morning. The nurse reported she looked in the patient's mouth and there appeared to be a white exudate on her tongue and the back of her throat that she felt was consistent with thrush. She notes the patient is still able to swallow easily though has minimal discomfort. She has not had any fevers. She has been taking her pills with applesauce with this. The nurse reports she is at her baseline. I advised that I would send in a prescription for medication to treat thrush. She asked that I send in a prescription for this medication to [place], it appears that this is [facility] in our system. Nystatin was e-prescribed to this pharmacy. Discussed that the patient should be evaluated tomorrow and the nurse reported that Dr. Silvio Pate would be there tomorrow and she would write a note for him to evaluate the patient. I advised the nurse of reasons for patient to be seen today. She voiced understanding of this. I will forward this to the patient's PCP as an FYI.

## 2015-06-16 DIAGNOSIS — B379 Candidiasis, unspecified: Secondary | ICD-10-CM

## 2015-06-16 NOTE — Telephone Encounter (Signed)
PLEASE NOTE: All timestamps contained within this report are represented as [place] Standard Time. CONFIDENTIALTY NOTICE: This fax transmission is intended only for the addressee. It contains information that is legally privileged, confidential or otherwise protected from use or disclosure. If you are not the intended recipient, you are strictly prohibited from reviewing, disclosing, copying using or disseminating any of this information or taking any action in reliance on or regarding this information. If you have received this fax in error, please notify us immediately by telephone so that we can arrange for its return to [place]. Phone: [phone], Toll-Free: [phone], Fax: [phone] Page: 1 of 1 Call Id: [id number] [place] Patient Name: [name] Gender: Female DOB: February 08, 1927 Age: 81 Y 56 M 2 D Return Phone Number: Address: City/State/Zip: [place] Client [place] Night - Client Client Site [place] Physician Viviana Simpler Contact Type Call Call Type Page Only Caller Name Teressa Senter Relationship To Patient Provider Is this call to report lab results? No Return Phone Number Please choose phone number Initial Comment Brule from [place] at ph [phone], Pt has thrush , wanting meds called in Nurse Assessment Guidelines Guideline Title Affirmed Question Affirmed Notes Nurse Date/Time (Eastern Time) Disp. Time Eilene Ghazi Time) Disposition Final User 06/15/2015 10:54:32 AM Send to [facility] Paging Queue Almon Register 06/15/2015 11:04:56 AM Called On-Call Provider Caryl Comes 06/15/2015 11:05:36 AM Page Completed Yes Caryl Comes After Care Instructions Given Call Event Type User Date / Time Description Paging DoctorName Phone DateTime Result/Outcome Message Type Notes Tommi Rumps  [id number] 06/15/2015 11:04:56 AM Called On Call Provider - Reached Doctor Paged Tommi Rumps 06/15/2015 11:05:10 AM Spoke with On Call - General Message Result

## 2015-06-16 NOTE — Telephone Encounter (Signed)
I took care of this today.

## 2015-06-16 NOTE — Telephone Encounter (Signed)
I took care of this today---will change every 3 days

## 2015-06-16 NOTE — Telephone Encounter (Signed)
PLEASE NOTE: All timestamps contained within this report are represented as [place] Standard Time. CONFIDENTIALTY NOTICE: This fax transmission is intended only for the addressee. It contains information that is legally privileged, confidential or otherwise protected from use or disclosure. If you are not the intended recipient, you are strictly prohibited from reviewing, disclosing, copying using or disseminating any of this information or taking any action in reliance on or regarding this information. If you have received this fax in error, please notify us immediately by telephone so that we can arrange for its return to [place]. Phone: [phone], Toll-Free: [phone], Fax: [phone] Page: 1 of 2 Call Id: [id number] [place] Patient Name: [name] Gender: Female DOB: 22-Apr-1927 Age: 81 Y 64 M 1 D Return Phone Number: [phone] (Primary), [phone] (Secondary) Address[place] [address] Client [place] Day - Client Client Site [place] - [place] Physician Viviana Simpler Contact Type Call Call Type Triage / [place] Name [id number] Relationship To Patient Provider Appointment Disposition EMR Appointment Not Necessary Info pasted into Epic No Return Phone Number [phone] (Secondary) Chief Complaint Paging or Request for Consult Initial Comment Courtesy call - need to speak to on call about the patient 's wound and bandage. Caller states: Colletta Maryland at [facility] term care facility need to speak with md on call about pt dressing change. Nurse Assessment Nurse: Dimas Chyle, RN, Dellis Filbert Date/Time Eilene Ghazi Time): 06/14/2015 3:57:21 PM Confirm and document reason for call. If symptomatic, describe symptoms. ---Colletta Maryland at [facility] term care facility need to speak with md on call about pt  dressing change. Patient has bilateral venastasis ulcers and previously had daily dressing changes ordered but was then seen by vascular surgeon Dr. Lucky Cowboy and he recommended that she have Unaboots placed for 1 week then be rechecked but no actual order. Facility is needing order for unaboots. Has the patient traveled out of the country within the last 30 days? ---No Does the patient have any new or worsening symptoms? ---No Please document clinical information provided and list any resource used. ---Advised caller that I would have to contact on-call for actual orders for dressing changes. Guidelines Guideline Title Affirmed Question Affirmed Notes Nurse Date/Time (Eastern Time) Disp. Time Eilene Ghazi Time) Disposition Final User 06/14/2015 2:34:47 PM Send To Clinical Follow Up Lissa Hoard, [place] 06/14/2015 3:44:25 PM Send To Call Back Waiting For Nurse Bonnita Nasuti 06/14/2015 3:48:12 PM Send To Call Back Waiting For Nurse Donovan Kail, RNBarnetta Chapel 06/14/2015 4:00:44 PM Called On-Call Provider Dimas Chyle, RN, Dellis Filbert 06/14/2015 4:08:54 PM Clinical Call Yes Dimas Chyle, RN, Ammie Ferrier NOTE: All timestamps contained within this report are represented as [place] Standard Time. CONFIDENTIALTY NOTICE: This fax transmission is intended only for the addressee. It contains information that is legally privileged, confidential or otherwise protected from use or disclosure. If you are not the intended recipient, you are strictly prohibited from reviewing, disclosing, copying using or disseminating any of this information or taking any action in reliance on or regarding this information. If you have received this fax in error, please notify us immediately by telephone so that we can arrange for its return to [place]. Phone: [phone], Toll-Free: [phone], Fax: [phone] Page: 2 of 2 Call Id: [id number] After Care Instructions Given Call Event Type User Date / Time Description Comments User:  Georga Bora, RN Date/Time Eilene Ghazi Time): 06/14/2015 4:08:30 PM Called back to give verbal order per  on-call Dr. Caryl Bis: Leave unaboots in place per recommendation of vascular surgeon, Dr. Lucky Cowboy, until able to be reassessed by Dr. Silvio Pate. Paging DoctorName Phone DateTime Result/Outcome Message Type Notes Tommi Rumps [id number] 06/14/2015 4:00:44 PM Called On Call Provider - Reached Doctor Paged Tommi Rumps 06/14/2015 4:05:05 PM Spoke with On Call - General Message Result Spoke with Dr. Josephina Gip about order for unaboots. He gave verbal order to leave unaboots on per vascular surgeon recommendation and that he would send note to PCP to see if orders need to be changed.

## 2015-06-16 NOTE — Telephone Encounter (Signed)
I will check on her today

## 2015-06-18 ENCOUNTER — Encounter: Payer: Self-pay | Admitting: Emergency Medicine

## 2015-06-18 ENCOUNTER — Emergency Department: Payer: Medicare Other

## 2015-06-18 ENCOUNTER — Telehealth: Payer: Self-pay | Admitting: Internal Medicine

## 2015-06-18 ENCOUNTER — Inpatient Hospital Stay
Admission: EM | Admit: 2015-06-18 | Discharge: 2015-06-28 | DRG: 602 | Disposition: A | Discharge status: Hospice | Payer: Medicare Other | Attending: Specialist | Admitting: Specialist

## 2015-06-18 ENCOUNTER — Telehealth: Payer: Self-pay

## 2015-06-18 DIAGNOSIS — N179 Acute kidney failure, unspecified: Secondary | ICD-10-CM | POA: Insufficient documentation

## 2015-06-18 DIAGNOSIS — IMO0001 Reserved for inherently not codable concepts without codable children: Secondary | ICD-10-CM

## 2015-06-18 DIAGNOSIS — G8929 Other chronic pain: Secondary | ICD-10-CM | POA: Diagnosis present

## 2015-06-18 DIAGNOSIS — E43 Unspecified severe protein-calorie malnutrition: Secondary | ICD-10-CM | POA: Diagnosis present

## 2015-06-18 DIAGNOSIS — D473 Essential (hemorrhagic) thrombocythemia: Secondary | ICD-10-CM | POA: Diagnosis present

## 2015-06-18 DIAGNOSIS — J9601 Acute respiratory failure with hypoxia: Secondary | ICD-10-CM | POA: Diagnosis present

## 2015-06-18 DIAGNOSIS — Z515 Encounter for palliative care: Secondary | ICD-10-CM | POA: Diagnosis not present

## 2015-06-18 DIAGNOSIS — G9341 Metabolic encephalopathy: Secondary | ICD-10-CM | POA: Diagnosis present

## 2015-06-18 DIAGNOSIS — L03116 Cellulitis of left lower limb: Secondary | ICD-10-CM | POA: Diagnosis present

## 2015-06-18 DIAGNOSIS — E871 Hypo-osmolality and hyponatremia: Secondary | ICD-10-CM | POA: Diagnosis present

## 2015-06-18 DIAGNOSIS — L02415 Cutaneous abscess of right lower limb: Secondary | ICD-10-CM | POA: Diagnosis present

## 2015-06-18 DIAGNOSIS — L89152 Pressure ulcer of sacral region, stage 2: Secondary | ICD-10-CM | POA: Diagnosis not present

## 2015-06-18 DIAGNOSIS — Z880 Allergy status to penicillin: Secondary | ICD-10-CM

## 2015-06-18 DIAGNOSIS — R339 Retention of urine, unspecified: Secondary | ICD-10-CM | POA: Diagnosis present

## 2015-06-18 DIAGNOSIS — I482 Chronic atrial fibrillation: Secondary | ICD-10-CM | POA: Diagnosis present

## 2015-06-18 DIAGNOSIS — E860 Dehydration: Secondary | ICD-10-CM | POA: Diagnosis present

## 2015-06-18 DIAGNOSIS — Z87891 Personal history of nicotine dependence: Secondary | ICD-10-CM | POA: Diagnosis not present

## 2015-06-18 DIAGNOSIS — I959 Hypotension, unspecified: Secondary | ICD-10-CM | POA: Diagnosis present

## 2015-06-18 DIAGNOSIS — I5022 Chronic systolic (congestive) heart failure: Secondary | ICD-10-CM | POA: Diagnosis present

## 2015-06-18 DIAGNOSIS — L02416 Cutaneous abscess of left lower limb: Secondary | ICD-10-CM | POA: Diagnosis present

## 2015-06-18 DIAGNOSIS — R627 Adult failure to thrive: Secondary | ICD-10-CM | POA: Diagnosis present

## 2015-06-18 DIAGNOSIS — K219 Gastro-esophageal reflux disease without esophagitis: Secondary | ICD-10-CM | POA: Diagnosis present

## 2015-06-18 DIAGNOSIS — Z7901 Long term (current) use of anticoagulants: Secondary | ICD-10-CM

## 2015-06-18 DIAGNOSIS — Z881 Allergy status to other antibiotic agents status: Secondary | ICD-10-CM | POA: Diagnosis not present

## 2015-06-18 DIAGNOSIS — B9562 Methicillin resistant Staphylococcus aureus infection as the cause of diseases classified elsewhere: Secondary | ICD-10-CM | POA: Diagnosis present

## 2015-06-18 DIAGNOSIS — I429 Cardiomyopathy, unspecified: Secondary | ICD-10-CM | POA: Diagnosis present

## 2015-06-18 DIAGNOSIS — E039 Hypothyroidism, unspecified: Secondary | ICD-10-CM | POA: Diagnosis present

## 2015-06-18 DIAGNOSIS — Z882 Allergy status to sulfonamides status: Secondary | ICD-10-CM | POA: Diagnosis not present

## 2015-06-18 DIAGNOSIS — A4902 Methicillin resistant Staphylococcus aureus infection, unspecified site: Secondary | ICD-10-CM | POA: Diagnosis not present

## 2015-06-18 DIAGNOSIS — Z888 Allergy status to other drugs, medicaments and biological substances status: Secondary | ICD-10-CM

## 2015-06-18 DIAGNOSIS — I739 Peripheral vascular disease, unspecified: Secondary | ICD-10-CM | POA: Diagnosis present

## 2015-06-18 DIAGNOSIS — M069 Rheumatoid arthritis, unspecified: Secondary | ICD-10-CM | POA: Diagnosis present

## 2015-06-18 DIAGNOSIS — Z96653 Presence of artificial knee joint, bilateral: Secondary | ICD-10-CM | POA: Diagnosis present

## 2015-06-18 DIAGNOSIS — L03115 Cellulitis of right lower limb: Secondary | ICD-10-CM | POA: Diagnosis present

## 2015-06-18 DIAGNOSIS — J690 Pneumonitis due to inhalation of food and vomit: Secondary | ICD-10-CM | POA: Diagnosis present

## 2015-06-18 DIAGNOSIS — I5031 Acute diastolic (congestive) heart failure: Secondary | ICD-10-CM | POA: Diagnosis not present

## 2015-06-18 DIAGNOSIS — R4182 Altered mental status, unspecified: Secondary | ICD-10-CM

## 2015-06-18 DIAGNOSIS — Z8249 Family history of ischemic heart disease and other diseases of the circulatory system: Secondary | ICD-10-CM | POA: Diagnosis not present

## 2015-06-18 DIAGNOSIS — L8990 Pressure ulcer of unspecified site, unspecified stage: Secondary | ICD-10-CM | POA: Insufficient documentation

## 2015-06-18 DIAGNOSIS — D638 Anemia in other chronic diseases classified elsewhere: Secondary | ICD-10-CM | POA: Diagnosis present

## 2015-06-18 DIAGNOSIS — L02419 Cutaneous abscess of limb, unspecified: Secondary | ICD-10-CM | POA: Diagnosis present

## 2015-06-18 DIAGNOSIS — D509 Iron deficiency anemia, unspecified: Secondary | ICD-10-CM | POA: Diagnosis present

## 2015-06-18 DIAGNOSIS — F329 Major depressive disorder, single episode, unspecified: Secondary | ICD-10-CM | POA: Diagnosis present

## 2015-06-18 DIAGNOSIS — E785 Hyperlipidemia, unspecified: Secondary | ICD-10-CM | POA: Diagnosis present

## 2015-06-18 DIAGNOSIS — Z66 Do not resuscitate: Secondary | ICD-10-CM | POA: Diagnosis not present

## 2015-06-18 DIAGNOSIS — T798XXA Other early complications of trauma, initial encounter: Secondary | ICD-10-CM

## 2015-06-18 DIAGNOSIS — L89151 Pressure ulcer of sacral region, stage 1: Secondary | ICD-10-CM | POA: Diagnosis present

## 2015-06-18 DIAGNOSIS — L03119 Cellulitis of unspecified part of limb: Secondary | ICD-10-CM | POA: Diagnosis present

## 2015-06-18 LAB — COMPREHENSIVE METABOLIC PANEL
ALK PHOS: 136 U/L — AB (ref 38–126)
ALT: 29 U/L (ref 14–54)
ANION GAP: 9 (ref 5–15)
AST: 49 U/L — ABNORMAL HIGH (ref 15–41)
Albumin: 2.2 g/dL — ABNORMAL LOW (ref 3.5–5.0)
BUN: 71 mg/dL — ABNORMAL HIGH (ref 6–20)
CALCIUM: 8.1 mg/dL — AB (ref 8.9–10.3)
CO2: 25 mmol/L (ref 22–32)
CREATININE: 2.29 mg/dL — AB (ref 0.44–1.00)
Chloride: 95 mmol/L — ABNORMAL LOW (ref 101–111)
GFR, EST AFRICAN AMERICAN: 21 mL/min — AB (ref >60)
GFR, EST NON AFRICAN AMERICAN: 18 mL/min — AB (ref >60)
Glucose, Bld: 98 mg/dL (ref 65–99)
Potassium: 5.1 mmol/L (ref 3.5–5.1)
SODIUM: 129 mmol/L — AB (ref 135–145)
Total Bilirubin: 0.5 mg/dL (ref 0.3–1.2)
Total Protein: 5.8 g/dL — ABNORMAL LOW (ref 6.5–8.1)

## 2015-06-18 LAB — PROTIME-INR
INR: 1.23
PROTHROMBIN TIME: 15.7 s — AB (ref 11.4–15.0)

## 2015-06-18 LAB — CBC
HEMATOCRIT: 24.8 % — AB (ref 35.0–47.0)
HEMATOCRIT: 25.3 % — AB (ref 35.0–47.0)
HEMOGLOBIN: 8 g/dL — AB (ref 12.0–16.0)
Hemoglobin: 8.2 g/dL — ABNORMAL LOW (ref 12.0–16.0)
MCH: 25.7 pg — ABNORMAL LOW (ref 26.0–34.0)
MCH: 26.3 pg (ref 26.0–34.0)
MCHC: 32.3 g/dL (ref 32.0–36.0)
MCHC: 32.3 g/dL (ref 32.0–36.0)
MCV: 79.8 fL — ABNORMAL LOW (ref 80.0–100.0)
MCV: 81.3 fL (ref 80.0–100.0)
Platelets: 441 10*3/uL — ABNORMAL HIGH (ref 150–440)
Platelets: 375 10*3/uL (ref 150–440)
RBC: 3.11 MIL/uL — AB (ref 3.80–5.20)
RBC: 3.12 MIL/uL — ABNORMAL LOW (ref 3.80–5.20)
RDW: 20.4 % — ABNORMAL HIGH (ref 11.5–14.5)
RDW: 20.4 % — AB (ref 11.5–14.5)
WBC: 9.8 10*3/uL (ref 3.6–11.0)

## 2015-06-18 LAB — URINALYSIS COMPLETE WITH MICROSCOPIC (ARMC ONLY)
BILIRUBIN URINE: NEGATIVE
Glucose, UA: NEGATIVE mg/dL
Hgb urine dipstick: NEGATIVE
KETONES UR: NEGATIVE mg/dL
Leukocytes, UA: NEGATIVE
Nitrite: NEGATIVE
PROTEIN: NEGATIVE mg/dL
SPECIFIC GRAVITY, URINE: 1.013 (ref 1.005–1.030)
pH: 5 (ref 5.0–8.0)

## 2015-06-18 LAB — APTT: APTT: 31 s (ref 24–36)

## 2015-06-18 LAB — BRAIN NATRIURETIC PEPTIDE: B NATRIURETIC PEPTIDE 5: 910 pg/mL — AB (ref 0.0–100.0)

## 2015-06-18 LAB — BLOOD GAS, VENOUS
ACID-BASE EXCESS: 1.7 mmol/L (ref 0.0–3.0)
BICARBONATE: 28.2 meq/L — AB (ref 21.0–28.0)
Patient temperature: 37
pCO2, Ven: 51 mmHg (ref 44.0–60.0)
pH, Ven: 7.35 (ref 7.320–7.430)
pO2, Ven: <31 mmHg (ref 30.0–45.0)

## 2015-06-18 LAB — LACTIC ACID, PLASMA
Lactic Acid, Venous: 1.2 mmol/L (ref 0.5–2.0)
Lactic Acid, Venous: 1.8 mmol/L (ref 0.5–2.0)

## 2015-06-18 LAB — SEDIMENTATION RATE: SED RATE: 81 mm/h — AB (ref 0–30)

## 2015-06-18 MED ORDER — VANCOMYCIN HCL IN DEXTROSE 1-5 GM/200ML-% IV SOLN: 1000.0000 mg | INTRAVENOUS | Status: DC

## 2015-06-18 MED ORDER — ACETAMINOPHEN 325 MG PO TABS: 650.0000 mg | ORAL_TABLET | Freq: Four times a day (QID) | ORAL | Status: DC | PRN

## 2015-06-18 MED ORDER — VITAMIN B-12 1000 MCG PO TABS
2000.0000 ug | ORAL_TABLET | Freq: Every day | ORAL | Status: DC
  Administered 2015-06-19 – 2015-06-24 (×6): 2000 ug via ORAL
  Filled 2015-06-18 (×6): qty 2

## 2015-06-18 MED ORDER — MAGNESIUM HYDROXIDE 400 MG/5ML PO SUSP
15.0000 mL | Freq: Every day | ORAL | Status: DC | PRN
  Filled 2015-06-18: qty 30

## 2015-06-18 MED ORDER — APIXABAN 2.5 MG PO TABS
2.5000 mg | ORAL_TABLET | Freq: Two times a day (BID) | ORAL | Status: DC
  Administered 2015-06-19 – 2015-06-26 (×15): 2.5 mg via ORAL
  Filled 2015-06-18 (×17): qty 1

## 2015-06-18 MED ORDER — DULOXETINE HCL 30 MG PO CPEP
30.0000 mg | ORAL_CAPSULE | Freq: Two times a day (BID) | ORAL | Status: DC
  Administered 2015-06-19 – 2015-06-24 (×11): 30 mg via ORAL
  Filled 2015-06-18 (×13): qty 1

## 2015-06-18 MED ORDER — SODIUM CHLORIDE 0.9 % IV BOLUS (SEPSIS)
1000.0000 mL | Freq: Once | INTRAVENOUS | Status: AC
  Administered 2015-06-18: 1000 mL via INTRAVENOUS

## 2015-06-18 MED ORDER — VANCOMYCIN HCL IN DEXTROSE 1-5 GM/200ML-% IV SOLN
1000.0000 mg | Freq: Once | INTRAVENOUS | Status: AC
  Administered 2015-06-18: 1000 mg via INTRAVENOUS
  Filled 2015-06-18: qty 200

## 2015-06-18 MED ORDER — METRONIDAZOLE IN NACL 5-0.79 MG/ML-% IV SOLN
500.0000 mg | Freq: Three times a day (TID) | INTRAVENOUS | Status: DC
  Administered 2015-06-18 – 2015-06-19 (×3): 500 mg via INTRAVENOUS
  Filled 2015-06-18 (×6): qty 100

## 2015-06-18 MED ORDER — ONDANSETRON HCL 4 MG PO TABS
4.0000 mg | ORAL_TABLET | ORAL | Status: DC | PRN
  Administered 2015-06-20 – 2015-06-22 (×3): 4 mg via ORAL
  Filled 2015-06-18 (×3): qty 1

## 2015-06-18 MED ORDER — OXYCODONE HCL ER 20 MG PO T12A
20.0000 mg | EXTENDED_RELEASE_TABLET | Freq: Two times a day (BID) | ORAL | Status: DC
  Administered 2015-06-19 – 2015-06-26 (×15): 20 mg via ORAL
  Filled 2015-06-18 (×15): qty 1

## 2015-06-18 MED ORDER — VANCOMYCIN HCL 10 G IV SOLR: 900.0000 mg | Freq: Once | INTRAVENOUS | Status: DC

## 2015-06-18 MED ORDER — FENTANYL CITRATE (PF) 100 MCG/2ML IJ SOLN
25.0000 ug | Freq: Once | INTRAMUSCULAR | Status: AC
  Administered 2015-06-18: 25 ug via INTRAVENOUS
  Filled 2015-06-18: qty 2

## 2015-06-18 MED ORDER — ALBUMIN HUMAN 25 % IV SOLN
12.5000 g | Freq: Three times a day (TID) | INTRAVENOUS | Status: DC
  Administered 2015-06-18 – 2015-06-23 (×14): 12.5 g via INTRAVENOUS
  Filled 2015-06-18 (×21): qty 50

## 2015-06-18 MED ORDER — OXYCODONE HCL 5 MG PO TABS
15.0000 mg | ORAL_TABLET | Freq: Four times a day (QID) | ORAL | Status: DC | PRN
  Administered 2015-06-21 – 2015-06-25 (×3): 15 mg via ORAL
  Filled 2015-06-18 (×3): qty 3

## 2015-06-18 MED ORDER — LEVOTHYROXINE SODIUM 75 MCG PO TABS
75.0000 ug | ORAL_TABLET | Freq: Every day | ORAL | Status: DC
  Administered 2015-06-19 – 2015-06-26 (×8): 75 ug via ORAL
  Filled 2015-06-18 (×8): qty 1

## 2015-06-18 MED ORDER — FERROUS SULFATE 325 (65 FE) MG PO TABS
325.0000 mg | ORAL_TABLET | Freq: Every day | ORAL | Status: DC
  Administered 2015-06-19 – 2015-06-24 (×6): 325 mg via ORAL
  Filled 2015-06-18 (×6): qty 1

## 2015-06-18 MED ORDER — POLYETHYLENE GLYCOL 3350 17 G PO PACK: 17.0000 g | PACK | Freq: Every day | ORAL | Status: DC | PRN

## 2015-06-18 MED ORDER — CIPROFLOXACIN IN D5W 400 MG/200ML IV SOLN
400.0000 mg | Freq: Two times a day (BID) | INTRAVENOUS | Status: DC
  Administered 2015-06-18 – 2015-06-19 (×2): 400 mg via INTRAVENOUS
  Filled 2015-06-18 (×3): qty 200

## 2015-06-18 MED ORDER — MORPHINE SULFATE (PF) 4 MG/ML IV SOLN
4.0000 mg | INTRAVENOUS | Status: DC | PRN
  Administered 2015-06-18 – 2015-06-19 (×2): 4 mg via INTRAVENOUS
  Filled 2015-06-18 (×3): qty 1

## 2015-06-18 MED ORDER — ATENOLOL 25 MG PO TABS
12.5000 mg | ORAL_TABLET | Freq: Every day | ORAL | Status: DC
  Filled 2015-06-18: qty 1

## 2015-06-18 MED ORDER — DOCUSATE SODIUM 100 MG PO CAPS
100.0000 mg | ORAL_CAPSULE | Freq: Two times a day (BID) | ORAL | Status: DC
  Administered 2015-06-19 – 2015-06-24 (×11): 100 mg via ORAL
  Filled 2015-06-18 (×12): qty 1

## 2015-06-18 MED ORDER — PANTOPRAZOLE SODIUM 40 MG PO TBEC
40.0000 mg | DELAYED_RELEASE_TABLET | Freq: Every day | ORAL | Status: DC
  Administered 2015-06-19 – 2015-06-27 (×9): 40 mg via ORAL
  Filled 2015-06-18 (×9): qty 1

## 2015-06-18 MED ORDER — HYDROXYCHLOROQUINE SULFATE 200 MG PO TABS
400.0000 mg | ORAL_TABLET | Freq: Every day | ORAL | Status: DC
  Administered 2015-06-19 – 2015-06-25 (×7): 400 mg via ORAL
  Filled 2015-06-18 (×7): qty 2

## 2015-06-18 MED ORDER — ACETAMINOPHEN 650 MG RE SUPP: 650.0000 mg | Freq: Four times a day (QID) | RECTAL | Status: DC | PRN

## 2015-06-18 MED ORDER — NYSTATIN 100000 UNIT/ML MT SUSP
5.0000 mL | Freq: Four times a day (QID) | OROMUCOSAL | Status: DC
  Administered 2015-06-19: 500000 [IU] via ORAL
  Filled 2015-06-18 (×2): qty 5

## 2015-06-18 MED ORDER — SENNA 8.6 MG PO TABS
1.0000 | ORAL_TABLET | Freq: Two times a day (BID) | ORAL | Status: DC
  Administered 2015-06-19 – 2015-06-26 (×14): 8.6 mg via ORAL
  Filled 2015-06-18 (×16): qty 1

## 2015-06-18 MED ORDER — ALPRAZOLAM 0.25 MG PO TABS
0.1250 mg | ORAL_TABLET | Freq: Three times a day (TID) | ORAL | Status: DC | PRN
Start: 2015-06-18 — End: 2015-06-19

## 2015-06-18 MED ORDER — BISMUTH SUBSALICYLATE 262 MG/15ML PO SUSP: 30.0000 mL | ORAL | Status: DC | PRN

## 2015-06-18 MED ORDER — VANCOMYCIN HCL IN DEXTROSE 1-5 GM/200ML-% IV SOLN
1000.0000 mg | Freq: Once | INTRAVENOUS | Status: AC
  Administered 2015-06-19: 21:00:00 1000 mg via INTRAVENOUS
  Filled 2015-06-18: qty 200

## 2015-06-18 MED ORDER — METHOTREXATE 2.5 MG PO TABS
12.5000 mg | ORAL_TABLET | ORAL | Status: DC
  Administered 2015-06-19: 09:00:00 12.5 mg via ORAL
  Filled 2015-06-18 (×2): qty 5

## 2015-06-18 MED ORDER — FOLIC ACID 1 MG PO TABS
2.0000 mg | ORAL_TABLET | Freq: Every day | ORAL | Status: DC
  Administered 2015-06-19 – 2015-06-24 (×6): 2 mg via ORAL
  Filled 2015-06-18 (×6): qty 2

## 2015-06-18 MED ORDER — ACYCLOVIR 5 % EX OINT
1.0000 "application " | TOPICAL_OINTMENT | CUTANEOUS | Status: DC | PRN
  Filled 2015-06-18: qty 15

## 2015-06-18 MED ORDER — SODIUM CHLORIDE 0.9 % IV SOLN
INTRAVENOUS | Status: DC
  Administered 2015-06-18: 22:00:00 via INTRAVENOUS

## 2015-06-18 MED ORDER — ENSURE ENLIVE PO LIQD: 237.0000 mL | Freq: Two times a day (BID) | ORAL | Status: DC

## 2015-06-18 NOTE — ED Provider Notes (Signed)
[facility] Emergency Department Provider Note  ____________________________________________  Time seen: Approximately 1:06 PM  I have reviewed the triage vital signs and the nursing notes.   HISTORY  Chief Complaint Altered Mental Status    HPI [name] is a 81 y.o. female the history of A. fib on Eliquis, tonic nonhealing bilateral lower extremity wounds status post debridement 05/12/15 presenting with altered mental status, purulence from leg wounds with increased pain.  Patient states "I feel terrible" but is unable to give any specific symptoms about what brought her in. Her husband states that over the past several weeks, the patient has been increasingly more somnolent, and decreased responsiveness throughout the day. He also reports that she is no longer eating or drinking. She states that she has also been having diarrhea. There is no known history of fever, chills, nausea or vomiting, abdominal pain, chest pain, palpitations.   Past Medical History  Diagnosis Date  . Arthritis   . Hyperlipidemia   . Hypothyroidism   . Atrial fibrillation ([place])     a. s/p prior DCCV's, now persistent-->Xarelto (CHA2DS2VASc = 3).  . Diverticulosis     a. by colonoscopy, h/o colon adenoma ([place])  . Gastritis and duodenitis     a. 01/2014 EGD: Moderate non-erosive gastritis and mild non-erosive duodenitis.    Patient Active Problem List   Diagnosis Date Noted  . Ulcers of both lower extremities, limited to breakdown of skin ([place])   . Bilateral leg ulcer ([place]) 05/08/2015  . Venous stasis ulcer of right lower extremity ([place]) 03/31/2015  . Abrasion of head 12/04/2014  . Gastric AVM 11/25/2014  . Mild malnutrition ([place]) 10/11/2014  . COPD (chronic obstructive pulmonary disease) with emphysema ([place]) 10/11/2014  . Chronic kidney disease, stage III (moderate) 10/11/2014  . Rheumatoid arthritis ([place]) 07/29/2014  . Stress due to illness of family member  01/11/2014  . Iron deficiency anemia due to chronic blood loss 01/11/2014  . Gastritis and duodenitis 01/11/2014  . Chronic venous insufficiency 02/02/2013  . Routine general medical examination at a health care facility 05/15/2012  . Other and unspecified hyperlipidemia 09/30/2010  . Osteoarthrosis involving more than one site 09/30/2010  . UNSPECIFIED HYPOTHYROIDISM 04/28/2009  . Atrial fibrillation ([place]) 04/28/2009    Past Surgical History  Procedure Laterality Date  . Rotator cuff repair    . Intraocular lens implant, secondary  1996/ 2010  . Knee surgery      lateral  . Tonsillectomy    . Colonoscopy  02/2011    mod diverticulosis ([place])  . Total knee arthroplasty Bilateral   . I&d extremity Bilateral 05/12/2015    Procedure: IRRIGATION AND DEBRIDEMENT EXTREMITY;  Surgeon: Algernon Huxley, MD;  Location: [facility] ORS;  Service: Vascular;  Laterality: Bilateral;  . Peripheral vascular catheterization  05/09/2015    Procedure: Lower Extremity Intervention;  Surgeon: Algernon Huxley, MD;  Location: [facility];  Service: Cardiovascular;;  . Peripheral vascular catheterization N/A 05/09/2015    Procedure: Abdominal Aortogram w/Lower Extremity;  Surgeon: Algernon Huxley, MD;  Location: [facility];  Service: Cardiovascular;  Laterality: N/A;    Current Outpatient Rx  Name  Route  Sig  Dispense  Refill  . ALPRAZolam (XANAX) 0.25 MG tablet   Oral   Take 0.125-0.25 mg by mouth 3 (three) times daily as needed for anxiety.         Marland Kitchen apixaban (ELIQUIS) 2.5 MG TABS tablet   Oral   Take 1  tablet (2.5 mg total) by mouth 2 (two) times daily.   60 tablet   3     Pt would like loose tablets.   Marland Kitchen atenolol (TENORMIN) 25 MG tablet   Oral   Take 12.5 mg by mouth daily.         . Bismuth Subsalicylate (KAOPECTATE PO)   Oral   Take 30 mLs by mouth every 4 (four) hours as needed (for diarrhea).         . DULoxetine (CYMBALTA) 30 MG capsule   Oral   Take 30 mg by mouth 2 (two)  times daily.         . ferrous sulfate 325 (65 FE) MG tablet   Oral   Take 325 mg by mouth daily.         . folic acid (FOLVITE) 1 MG tablet   Oral   Take 2 mg by mouth daily.          . furosemide (LASIX) 40 MG tablet   Oral   Take 40 mg by mouth daily.         . hydroxychloroquine (PLAQUENIL) 200 MG tablet   Oral   Take 400 mg by mouth at bedtime.          Marland Kitchen levothyroxine (SYNTHROID, LEVOTHROID) 75 MCG tablet   Oral   Take 75 mcg by mouth daily.         . magnesium hydroxide (MILK OF MAGNESIA) 400 MG/5ML suspension   Oral   Take 15 mLs by mouth daily as needed for mild constipation.          . methotrexate (RHEUMATREX) 2.5 MG tablet   Oral   Take 12.5 mg by mouth once a week. Pt takes on Wednesday.    Caution:Chemotherapy. Protect from light.         . Multiple Vitamin (MULTIVITAMIN WITH MINERALS) TABS tablet   Oral   Take 1 tablet by mouth daily.         Marland Kitchen omeprazole (PRILOSEC) 40 MG capsule   Oral   Take 40 mg by mouth daily.         . ondansetron (ZOFRAN) 4 MG tablet   Oral   Take 4 mg by mouth every 4 (four) hours as needed for nausea or vomiting.         Marland Kitchen oxyCODONE (OXYCONTIN) 20 mg 12 hr tablet   Oral   Take 20 mg by mouth 2 (two) times daily.         Marland Kitchen oxyCODONE (ROXICODONE) 15 MG immediate release tablet   Oral   Take 1 tablet (15 mg total) by mouth 4 (four) times daily as needed for pain.   30 tablet   0   . polyethylene glycol (MIRALAX / GLYCOLAX) packet   Oral   Take 17 g by mouth daily as needed for mild constipation.          . vitamin B-12 (CYANOCOBALAMIN) 1000 MCG tablet   Oral   Take 2,000 mcg by mouth daily.         Marland Kitchen acyclovir ointment (ZOVIRAX) 5 %   Topical   Apply 1 application topically every 2 (two) hours as needed. Patient not taking: Reported on 06/18/2015   30 g   2   . ciprofloxacin (CIPRO) 500 MG tablet   Oral   Take 1 tablet (500 mg total) by mouth 2 (two) times daily. Patient not  taking: Reported on 06/18/2015   20 tablet  0   . doxycycline (VIBRAMYCIN) 100 MG capsule   Oral   Take 1 capsule (100 mg total) by mouth 2 (two) times daily. Patient not taking: Reported on 06/18/2015   20 capsule   0   . feeding supplement, ENSURE ENLIVE, (ENSURE ENLIVE) LIQD   Oral   Take 237 mLs by mouth 2 (two) times daily between meals. Patient not taking: Reported on 06/18/2015   237 mL   12   . nystatin (MYCOSTATIN) 100000 UNIT/ML suspension   Oral   Take 5 mLs (500,000 Units total) by mouth 4 (four) times daily. swish in the mouth and retain for several minutes before swallowing Patient not taking: Reported on 06/18/2015   60 mL   0     Allergies Dabigatran etexilate mesylate; Penicillins; Coumadin; Doxycycline hyclate; Sulfonamide derivatives; Tape; and Tramadol hcl  Family History  Problem Relation Age of Onset  . Heart disease Mother     Social History Social History  Substance Use Topics  . Smoking status: Former Smoker    Types: Cigarettes  . Smokeless tobacco: Never Used  . Alcohol Use: 0.6 oz/week    1 Glasses of wine per week    Review of Systems Constitutional: No fever/chills. Positive generalized fatigue and malaise. Eyes: No visual changes. ENT: No sore throat. Cardiovascular: Denies chest pain, palpitations. Respiratory: Denies shortness of breath.  No cough. Gastrointestinal: No abdominal pain.  No nausea, no vomiting.  Positive diarrhea.  No constipation. Genitourinary: Negative for dysuria. Musculoskeletal: Negative for back pain. Skin: Positive for nonhealing wounds in the bilateral lower extremities and left foot which appear to be worsening and are now having purulent discharge. Neurological: Negative for headaches, focal weakness or numbness.  10-point ROS otherwise negative.  ____________________________________________   PHYSICAL EXAM:  VITAL SIGNS: ED Triage Vitals  Enc Vitals Group     BP 06/18/15 1229 111/69 mmHg      Pulse Rate 06/18/15 1229 78     Resp 06/18/15 1229 18     Temp 06/18/15 1229 97.5 F (36.4 C)     Temp Source 06/18/15 1229 Oral     SpO2 06/18/15 1229 97 %     Weight 06/18/15 1229 136 lb 3.9 oz (61.8 kg)     Height 06/18/15 1229 5\' 5"  (1.651 m)     Head Cir --      Peak Flow --      Pain Score --      Pain Loc --      Pain Edu? --      Excl. in [place]? --     Constitutional: Patient is alert and oriented 4, answering questions appropriately. She is chronically ill-appearing and uncomfortable appearing.  Eyes: Conjunctivae are normal.  EOMI. no scleral icterus Head: Atraumatic. Nose: No congestion/rhinnorhea. Mouth/Throat: Mucous membranes are dry.  Neck: No stridor.  Supple.  No meningismus. Cardiovascular: Normal rate, regular rhythm. No murmurs, rubs or gallops.  Respiratory: Normal respiratory effort.  No retractions. Lungs CTAB.  No wheezes, rales or ronchi. Gastrointestinal: Soft and nontender. No distention. No peritoneal signs. Musculoskeletal: Bilateral lower extremities have thickened skin with purplish discoloration to the knees. Both distal tibia is have enlarged areas of nonhealing wounds that are approximately 12 x 6" with overlying purulence. The left foot on the dorsum of the foot also has another wound with a central eschar that is approximately 4 x 5". Patient has distal swelling of bilateral feet with cap refill greater than 3 seconds. She has no  palpable DP and PT pulses. She is able to wiggle her toes. She states that she does have sensation in both feet. Neurologic:  Normal speech and language. Face and smile are symmetric. Moves all extremity's well. Skin:  Skin is warm, dry and intact. No rash noted. Psychiatric: Mood and affect are normal. Speech and behavior are normal.  Normal judgement.  ____________________________________________   LABS (all labs ordered are listed, but only abnormal results are displayed)  Labs Reviewed  COMPREHENSIVE METABOLIC PANEL  - Abnormal; Notable for the following:    Sodium 129 (*)    Chloride 95 (*)    BUN 71 (*)    Creatinine, Ser 2.29 (*)    Calcium 8.1 (*)    Total Protein 5.8 (*)    Albumin 2.2 (*)    AST 49 (*)    Alkaline Phosphatase 136 (*)    GFR calc non Af Amer 18 (*)    GFR calc Af Amer 21 (*)    All other components within normal limits  CBC - Abnormal; Notable for the following:    RBC 3.11 (*)    Hemoglobin 8.0 (*)    HCT 24.8 (*)    MCV 79.8 (*)    MCH 25.7 (*)    RDW 20.4 (*)    Platelets 441 (*)    All other components within normal limits  PROTIME-INR - Abnormal; Notable for the following:    Prothrombin Time 15.7 (*)    All other components within normal limits  CULTURE, BLOOD (ROUTINE X 2)  CULTURE, BLOOD (ROUTINE X 2)  URINE CULTURE  APTT  SEDIMENTATION RATE  C-REACTIVE PROTEIN  BLOOD GAS, VENOUS  LACTIC ACID, PLASMA  LACTIC ACID, PLASMA  BRAIN NATRIURETIC PEPTIDE  TYPE AND SCREEN   ____________________________________________  EKG  ED ECG REPORT I, Eula Listen, the attending physician, personally viewed and interpreted this ECG.   Date: 06/18/2015  EKG Time: 1229  Rate: 87  Rhythm: Atrial fibrillation  Axis: [place]  Intervals:none  ST&T Change: No ST elevation  ____________________________________________  RADIOLOGY  Dg Chest 2 View  06/18/2015  CLINICAL DATA:  Decreased appetite activity.  Lower extremity edema. EXAM: CHEST  2 VIEW COMPARISON:  05/13/2015 . FINDINGS: Mediastinum hilar structures normal. Cardiomegaly with bilateral pulmonary interstitial prominence consistent with congestive heart failure. Right lower lobe infiltrate consistent with focal pulmonary edema and/or pneumonia. Small bilateral pleural effusions. IMPRESSION: 1. Congestive heart failure bilateral from interstitial edema and small pleural effusions. 2. Right lower lobe infiltrate consistent with asymmetric pulmonary edema and/or pneumonia. Electronically Signed   By: Marcello Moores   Register   On: 06/18/2015 14:10   Dg Tibia/fibula Left  06/18/2015  CLINICAL DATA:  Lower extremity edema and ulcerations EXAM: LEFT TIBIA AND FIBULA - 2 VIEW COMPARISON:  None. FINDINGS: Frontal and lateral views were obtained. Patient is status post total knee replacement with the prosthetic components appearing well-seated. No acute fracture or dislocation. No blastic or lytic bone lesions. No abnormal periosteal reaction. Several defects in the soft tissues potentially may represent areas of ulceration. There are multiple areas of arterial vascular calcification. IMPRESSION: Soft tissue defects which may represent areas of ulceration. There is arterial vascular calcification at multiple sites. No erosive change or bony destruction is appreciable. No blastic or lytic bone lesions. No fracture or dislocation. Total knee replacement with prosthetic components appearing well-seated. Electronically Signed   By: Lowella Grip III M.D.   On: 06/18/2015 14:08   Dg Tibia/fibula Right  06/18/2015  CLINICAL DATA:  Lower extremity edema and ulcers. EXAM: RIGHT TIBIA AND FIBULA - 2 VIEW COMPARISON:  No prior. FINDINGS: Total right knee replacement. No evidence of fracture dislocation. No focal bony abnormality. No acute bony abnormality. Soft tissue deformity noted may be related to the patient's history of edema and ulcerations. Peripheral vascular calcification. IMPRESSION: 1. Total right knee replacement. No acute or focal bony abnormality. 2. Soft tissue deformity consistent with patient's history of edema and ulcerations. Peripheral vascular disease. Electronically Signed   By: Marcello Moores  Register   On: 06/18/2015 14:09   Dg Foot Complete Left  06/18/2015  CLINICAL DATA:  Edema and ulcers to bilateral legs EXAM: LEFT FOOT - COMPLETE 3+ VIEW COMPARISON:  None. FINDINGS: No fracture or dislocation is seen. The joint spaces are preserved. Mild soft tissue swelling along the dorsal forefoot. No radiographic  findings of acute osteomyelitis. IMPRESSION: Mild soft tissue swelling along the dorsal forefoot. No radiographic findings of acute osteomyelitis. Electronically Signed   By: Julian Hy M.D.   On: 06/18/2015 14:10   Dg Foot Complete Right  06/18/2015  CLINICAL DATA:  Lower extremity ulceration and infection. EXAM: RIGHT FOOT COMPLETE - 3+ VIEW COMPARISON:  None. FINDINGS: Moderate osteopenia is present. Ulcerations are noted along the plantar surface of the foot near the MTP joints. There is no underlying bone erosion or fracture. The joint is located. Mild diffuse edema is present. IMPRESSION: 1. Ulcerations along the plantar surface the foot without underlying osseous change. 2. Moderate osteopenia. Electronically Signed   By: San Morelle M.D.   On: 06/18/2015 14:10    ____________________________________________   PROCEDURES  Procedure(s) performed: None  Critical Care performed: Yes, see critical care note(s) ____________________________________________   INITIAL IMPRESSION / ASSESSMENT AND PLAN / ED COURSE  Pertinent labs & imaging results that were available during my care of the patient were reviewed by me and considered in my medical decision making (see chart for details).  81 y.o. female with chronic nonhealing lower extremity wounds presenting with purulent discharge, increased pain, and altered mental status. The likely cause of her somnolence and altered mental status is worsening infection, consider also bacteremia. I will also check for urinary tract infection. The patient will need admission.  ----------------------------------------- 2:14 PM on 06/18/2015 -----------------------------------------  The patient remains hemodynamically stable. She does not have any evidence of gas on her x-rays or any other likely acute findings. She is hyponatremic with acute renal insufficiency. Her chest x-ray is also concerning for pneumonia and interstitial edema  consistent with pulmonary edema from congestive heart failure. I have added a BNP to her lab work. I am awaiting the remainder of her studies but anticipate admission. I will also talk to Dr. Lucky Cowboy to get any further recommendations but anticipate admission to the hospitalist.  ----------------------------------------- 2:41 PM on 06/18/2015 -----------------------------------------  Have contacted Dr. Halina Maidens, who is on-call for Dr. dew and is aware that the patient is here. He is currently in surgery and will contact the hospitalist after he has finished. The patient has a well blood cell count which is still pending and is likely too high to count. She has hyponatremia with acute renal insufficiency. I then given her vancomycin for her infection. The hospitalist will also add treatment for her pneumonia. She is admitted to the hospital.  CRITICAL CARE Performed by: Eula Listen   Total critical care time: 50 minutes  Critical care time was exclusive of separately billable procedures and treating other patients.  Critical care  was necessary to treat or prevent imminent or life-threatening deterioration.  Critical care was time spent personally by me on the following activities: development of treatment plan with patient and/or surrogate as well as nursing, discussions with consultants, evaluation of patient's response to treatment, examination of patient, obtaining history from patient or surrogate, ordering and performing treatments and interventions, ordering and review of laboratory studies, ordering and review of radiographic studies, pulse oximetry and re-evaluation of patient's condition.  ____________________________________________  FINAL CLINICAL IMPRESSION(S) / ED DIAGNOSES  Final diagnoses:  Acute renal failure, unspecified acute renal failure type ([place])  Hyponatremia  Complicated wound infection, initial encounter ([place])  Altered mental status, unspecified altered mental  status type      NEW MEDICATIONS STARTED DURING THIS VISIT:  New Prescriptions   No medications on file     Eula Listen, MD 06/18/15 1443

## 2015-06-18 NOTE — Telephone Encounter (Signed)
[name] healthcare POA (paperwork on file at [facility]) said pt has had significant change, more agitated and very confused. Margaret request cb from Dr Silvio Pate. Pt is at [facility] ED now.

## 2015-06-18 NOTE — Telephone Encounter (Signed)
Discussed with her ER eval ongoing Told her they should consider my recent increase of the duloxetine as the cause (especially if her ulcers don't look infected and no other cause is evident

## 2015-06-18 NOTE — Telephone Encounter (Signed)
Deana @ [facility] called  Ms [name] wanted her to go to [facility] ER Low BP, very confused weak,

## 2015-06-18 NOTE — Progress Notes (Signed)
ANTIBIOTIC CONSULT NOTE - INITIAL  Pharmacy Consult for Vancomycin Day 1 Indication: Cellulitis-purulent, abscess, hx of MRSA cellulitis  Allergies  Allergen Reactions  . Dabigatran Etexilate Mesylate Shortness Of Breath  . Penicillins Anaphylaxis and Other (See Comments)    Has patient had a PCN reaction causing immediate rash, facial/tongue/throat swelling, SOB or lightheadedness with hypotension: Yes Has patient had a PCN reaction causing severe rash involving mucus membranes or skin necrosis: No Has patient had a PCN reaction that required hospitalization No Has patient had a PCN reaction occurring within the last 10 years: Yes If all of the above answers are "NO", then may proceed with Cephalosporin use.  Marland Kitchen Coumadin [Warfarin Sodium] Other (See Comments)    Reaction:  Hematoma   . Doxycycline Hyclate [Doxycycline] Itching  . Sulfonamide Derivatives Other (See Comments)    Reaction:  Unknown   . Tape Other (See Comments)    Reaction:  Blisters   . Tramadol Hcl Itching    Patient Measurements: Height: 5\' 5"  (165.1 cm) Weight: 136 lb 3.9 oz (61.8 kg) IBW/kg (Calculated) : 57 Adjusted Body Weight:   Vital Signs: Temp: 97.7 F (36.5 C) (12/14 1549) Temp Source: Oral (12/14 1549) BP: 103/58 mmHg (12/14 1549) Pulse Rate: 88 (12/14 1549) Intake/Output from previous day:   Intake/Output from this shift:    Labs:  Recent Labs  06/18/15 1249  HGB 8.0*  PLT 441*  CREATININE 2.29*   Estimated Creatinine Clearance: 15.9 mL/min (by C-G formula based on Cr of 2.29).   Microbiology: No results found for this or any previous visit (from the past 720 hour(s)).  Medical History: Past Medical History  Diagnosis Date  . Arthritis   . Hyperlipidemia   . Hypothyroidism   . Atrial fibrillation ([place])     a. s/p prior DCCV's, now persistent-->Xarelto (CHA2DS2VASc = 3).  . Diverticulosis     a. by colonoscopy, h/o colon adenoma ([place])  . Gastritis and duodenitis     a.  01/2014 EGD: Moderate non-erosive gastritis and mild non-erosive duodenitis.    Medications:  Scheduled:   Anti-infectives    Start     Dose/Rate Route Frequency Ordered Stop   06/21/15 1800  vancomycin (VANCOCIN) IVPB 1000 mg/200 mL premix     1,000 mg 200 mL/hr over 60 Minutes Intravenous Every 48 hours 06/18/15 1554     06/19/15 1800  vancomycin (VANCOCIN) IVPB 1000 mg/200 mL premix     1,000 mg 200 mL/hr over 60 Minutes Intravenous  Once 06/18/15 1554     06/18/15 1545  vancomycin (VANCOCIN) IVPB 1000 mg/200 mL premix     1,000 mg 200 mL/hr over 60 Minutes Intravenous  Once 06/18/15 1531     06/18/15 1500  ciprofloxacin (CIPRO) IVPB 400 mg     400 mg 200 mL/hr over 60 Minutes Intravenous Every 12 hours 06/18/15 1450     06/18/15 1500  metroNIDAZOLE (FLAGYL) IVPB 500 mg     500 mg 100 mL/hr over 60 Minutes Intravenous Every 8 hours 06/18/15 1450     06/18/15 1315  vancomycin (VANCOCIN) injection 900 mg  Status:  Discontinued     900 mg Intravenous  Once 06/18/15 1309 06/18/15 1531     Assessment: 81 yo Female from SNF with hx of chronic Bilateral LE wounds with AMS,increasing purulence.   Patient on Hydroxychloroquine and Methotrexate at facility. Allergies: PCN(anaphylaxis) Sulfa(unknown), Doxycycline (itching)  04/16/15: Wound cx= MRSA 05/08/15: Wound cx= Pseudomonas (resistant to Imipenem)  06/18/15 Wound cx pending, UA,  Ucx, Bcx pending.  Ke 0.018  T1/2 38.5  Vd 43.  Scr 2.29  Goal of Therapy:  Vancomycin trough level 15-20 mcg/ml  Plan:  Measure antibiotic drug levels at steady state Follow up culture results  Will order Vancomycin 1 gram IV x 1 in ER. Will order 2nd Vancomycin dose of 1 gram ~26 hrs after 1st dose, for stacked dosing. Will then continue with Vancomycin 1 gram IV Q48h.  Will order trough prior to 3rd dose on 12/17 at 1730 (4th day of therapy- not at steady state).  (Note: patient also ordered Cipro/Metronidazole)  Chinita Greenland  PharmD Clinical Pharmacist 06/18/2015 4:12 PM

## 2015-06-18 NOTE — Plan of Care (Signed)
Problem: Education: Goal: Knowledge of [facility] Education information/materials will improve Outcome: Progressing Ed to spouse at bedside. Pt unable to understand at this time  Problem: Safety: Goal: Ability to remain free from injury will improve Outcome: Progressing  Bed alarm in use

## 2015-06-18 NOTE — Progress Notes (Signed)
Pt admitted from ed . Resides at  [facility].   Skin cold to touch.  bil upper ext  2-3 plus edema at legs. Rt upper arm  Edematous  And cool. Moans out at times.  bil lower ext cellulitis.abx  And fluids being given.

## 2015-06-18 NOTE — ED Notes (Signed)
Pt to ED via EMS from [facility] c/o AMS for a couple days.  Per EMS patient typically A&O, ambulatory but had decline in appetite and activity for past couple of days, edema and ulcers to bilateral legs.  Pt states "I just do not feel good".  Pt is A&Ox4, denies pain, denies n/v, states having diarrhea.

## 2015-06-18 NOTE — ED Notes (Signed)
Unable to obtain blood for lactic acid, Receiving RN Darliss Ridgel made aware and to coordinate with lab.

## 2015-06-18 NOTE — ED Notes (Signed)
Report attempted 

## 2015-06-18 NOTE — Telephone Encounter (Signed)
Mr Cristofaro left v/m; concerned that pt is not eating and pt is sleeping a lot. Mr Poertner requested cb. I called back and spoke with Joaquim Lai and she said Mr Barten is at ED with pt and Joaquim Lai said is going to have pt not eating evaluated at hospital. Joaquim Lai will tell Mr Bieschke if has any further concerns to cb.

## 2015-06-18 NOTE — H&P (Addendum)
[facility] NAME: [name]    MR#:  [id number]  DATE OF BIRTH:  May 26, 1927  DATE OF ADMISSION:  06/18/2015  PRIMARY CARE PHYSICIAN: Viviana Simpler, MD   REQUESTING/REFERRING PHYSICIAN:   CHIEF COMPLAINT:   Chief Complaint  Patient presents with  . Altered Mental Status    HISTORY OF PRESENT ILLNESS: [name]  is a 81 y.o. female with a known history of healing bilateral lower extremity wounds , this post vascular intervention really no significant peripheral vascular disease by Dr. dew approximately 1 month ago, after which patient was sent to [facility] for rehabilitation and wound care, comes back to the hospital with altered mental status, increasing somnolence increasing pain in lower extremities as well as increasing purulence of her chronic wounds. Patient is unable to provide review of systems  PAST MEDICAL HISTORY:   Past Medical History  Diagnosis Date  . Arthritis   . Hyperlipidemia   . Hypothyroidism   . Atrial fibrillation ([place])     a. s/p prior DCCV's, now persistent-->Xarelto (CHA2DS2VASc = 3).  . Diverticulosis     a. by colonoscopy, h/o colon adenoma ([place])  . Gastritis and duodenitis     a. 01/2014 EGD: Moderate non-erosive gastritis and mild non-erosive duodenitis.    PAST SURGICAL HISTORY:  Past Surgical History  Procedure Laterality Date  . Rotator cuff repair    . Intraocular lens implant, secondary  1996/ 2010  . Knee surgery      lateral  . Tonsillectomy    . Colonoscopy  02/2011    mod diverticulosis ([place])  . Total knee arthroplasty Bilateral   . I&d extremity Bilateral 05/12/2015    Procedure: IRRIGATION AND DEBRIDEMENT EXTREMITY;  Surgeon: Algernon Huxley, MD;  Location: [facility] ORS;  Service: Vascular;  Laterality: Bilateral;  . Peripheral vascular catheterization  05/09/2015    Procedure: Lower Extremity Intervention;  Surgeon: Algernon Huxley, MD;  Location: [facility]  CV LAB;  Service: Cardiovascular;;  . Peripheral vascular catheterization N/A 05/09/2015    Procedure: Abdominal Aortogram w/Lower Extremity;  Surgeon: Algernon Huxley, MD;  Location: [place] [facility];  Service: Cardiovascular;  Laterality: N/A;    SOCIAL HISTORY:  Social History  Substance Use Topics  . Smoking status: Former Smoker    Types: Cigarettes  . Smokeless tobacco: Never Used  . Alcohol Use: 0.6 oz/week    1 Glasses of wine per week    FAMILY HISTORY:  Family History  Problem Relation Age of Onset  . Heart disease Mother     DRUG ALLERGIES:  Allergies  Allergen Reactions  . Dabigatran Etexilate Mesylate Shortness Of Breath  . Penicillins Anaphylaxis and Other (See Comments)    Has patient had a PCN reaction causing immediate rash, facial/tongue/throat swelling, SOB or lightheadedness with hypotension: Yes Has patient had a PCN reaction causing severe rash involving mucus membranes or skin necrosis: No Has patient had a PCN reaction that required hospitalization No Has patient had a PCN reaction occurring within the last 10 years: Yes If all of the above answers are "NO", then may proceed with Cephalosporin use.  Marland Kitchen Coumadin [Warfarin Sodium] Other (See Comments)    Reaction:  Hematoma   . Doxycycline Hyclate [Doxycycline] Itching  . Sulfonamide Derivatives Other (See Comments)    Reaction:  Unknown   . Tape Other (See Comments)    Reaction:  Blisters   . Tramadol Hcl Itching  Review of Systems  Unable to perform ROS: medical condition    MEDICATIONS AT HOME:  Prior to Admission medications   Medication Sig Start Date End Date Taking? Authorizing Provider  ALPRAZolam (XANAX) 0.25 MG tablet Take 0.125-0.25 mg by mouth 3 (three) times daily as needed for anxiety.   Yes Historical Provider, MD  apixaban (ELIQUIS) 2.5 MG TABS tablet Take 1 tablet (2.5 mg total) by mouth 2 (two) times daily. 01/20/15  Yes Minna Merritts, MD  atenolol (TENORMIN) 25 MG tablet  Take 12.5 mg by mouth daily.   Yes Historical Provider, MD  Bismuth Subsalicylate (KAOPECTATE PO) Take 30 mLs by mouth every 4 (four) hours as needed (for diarrhea).   Yes Historical Provider, MD  DULoxetine (CYMBALTA) 30 MG capsule Take 30 mg by mouth 2 (two) times daily.   Yes Historical Provider, MD  ferrous sulfate 325 (65 FE) MG tablet Take 325 mg by mouth daily.   Yes Historical Provider, MD  folic acid (FOLVITE) 1 MG tablet Take 2 mg by mouth daily.    Yes Historical Provider, MD  furosemide (LASIX) 40 MG tablet Take 40 mg by mouth daily.   Yes Historical Provider, MD  hydroxychloroquine (PLAQUENIL) 200 MG tablet Take 400 mg by mouth at bedtime.    Yes Historical Provider, MD  levothyroxine (SYNTHROID, LEVOTHROID) 75 MCG tablet Take 75 mcg by mouth daily.   Yes Historical Provider, MD  magnesium hydroxide (MILK OF MAGNESIA) 400 MG/5ML suspension Take 15 mLs by mouth daily as needed for mild constipation.    Yes Historical Provider, MD  methotrexate (RHEUMATREX) 2.5 MG tablet Take 12.5 mg by mouth once a week. Pt takes on Wednesday.    Caution:Chemotherapy. Protect from light.   Yes Historical Provider, MD  Multiple Vitamin (MULTIVITAMIN WITH MINERALS) TABS tablet Take 1 tablet by mouth daily.   Yes Historical Provider, MD  omeprazole (PRILOSEC) 40 MG capsule Take 40 mg by mouth daily.   Yes Historical Provider, MD  ondansetron (ZOFRAN) 4 MG tablet Take 4 mg by mouth every 4 (four) hours as needed for nausea or vomiting.   Yes Historical Provider, MD  oxyCODONE (OXYCONTIN) 20 mg 12 hr tablet Take 20 mg by mouth 2 (two) times daily.   Yes Historical Provider, MD  oxyCODONE (ROXICODONE) 15 MG immediate release tablet Take 1 tablet (15 mg total) by mouth 4 (four) times daily as needed for pain. 05/13/15  Yes Aldean Jewett, MD  polyethylene glycol [facility] Floria Raveling) packet Take 17 g by mouth daily as needed for mild constipation.    Yes Historical Provider, MD  vitamin B-12 (CYANOCOBALAMIN)  1000 MCG tablet Take 2,000 mcg by mouth daily.   Yes Historical Provider, MD  acyclovir ointment (ZOVIRAX) 5 % Apply 1 application topically every 2 (two) hours as needed. Patient not taking: Reported on 06/18/2015 04/28/15   Venia Carbon, MD  ciprofloxacin (CIPRO) 500 MG tablet Take 1 tablet (500 mg total) by mouth 2 (two) times daily. Patient not taking: Reported on 06/18/2015 05/13/15   Aldean Jewett, MD  doxycycline (VIBRAMYCIN) 100 MG capsule Take 1 capsule (100 mg total) by mouth 2 (two) times daily. Patient not taking: Reported on 06/18/2015 05/13/15   Aldean Jewett, MD  feeding supplement, ENSURE ENLIVE, (ENSURE ENLIVE) LIQD Take 237 mLs by mouth 2 (two) times daily between meals. Patient not taking: Reported on 06/18/2015 05/13/15   Aldean Jewett, MD  nystatin (MYCOSTATIN) 100000 UNIT/ML suspension Take 5 mLs (500,000 Units  total) by mouth 4 (four) times daily. swish in the mouth and retain for several minutes before swallowing Patient not taking: Reported on 06/18/2015 06/15/15   Leone Haven, MD      PHYSICAL EXAMINATION:   VITAL SIGNS: Blood pressure 99/56, pulse 78, temperature 97.5 F (36.4 C), temperature source Oral, resp. rate 16, height 5\' 5"  (1.651 m), weight 61.8 kg (136 lb 3.9 oz), SpO2 97 %.  GENERAL:  81 y.o.-year-old patient lying in the bed in moderate distress due to pain. Dry oral mucosa, confused EYES: Pupils equal, round, reactive to light and accommodation. No scleral icterus. Extraocular muscles intact.  HEENT: Head atraumatic, normocephalic. Oropharynx and nasopharynx clear.  NECK:  Supple, no jugular venous distention. No thyroid enlargement, no tenderness.  LUNGS: Some diminished breath sounds bilaterally due to poor effort, no wheezing, rales,rhonchi or crepitation. Intermittent use of accessory muscles of respiration with pain and discomfort.  CARDIOVASCULAR: S1, S2 normal. No murmurs, rubs, or gallops.  ABDOMEN: Soft, nontender,  nondistended. Bowel sounds present. No organomegaly or mass.  EXTREMITIES: No pedal edema, cyanosis, or clubbing.  NEUROLOGIC: Cranial nerves II through XII are intact. Muscle strength 5/5 in all extremities. Sensation intact. Gait not checked.  PSYCHIATRIC: The patient is alert and oriented x 3.  SKIN: Bilateral lower extremity. This revealed ulcerations full skin with some yellow slough at the base of the ulcers, no malodor smell. 1+ lower extremity swelling bilaterally, 1+ pedal pulses bilaterally  LABORATORY PANEL:   CBC  Recent Labs Lab 06/18/15 1249  WBC PENDING  HGB 8.0*  HCT 24.8*  PLT 441*  MCV 79.8*  MCH 25.7*  MCHC 32.3  RDW 20.4*   ------------------------------------------------------------------------------------------------------------------  Chemistries   Recent Labs Lab 06/18/15 1249  NA 129*  K 5.1  CL 95*  CO2 25  GLUCOSE 98  BUN 71*  CREATININE 2.29*  CALCIUM 8.1*  AST 49*  ALT 29  ALKPHOS 136*  BILITOT 0.5   ------------------------------------------------------------------------------------------------------------------  Cardiac Enzymes No results for input(s): TROPONINI in the last 168 hours. ------------------------------------------------------------------------------------------------------------------  RADIOLOGY: Dg Chest 2 View  06/18/2015  CLINICAL DATA:  Decreased appetite activity.  Lower extremity edema. EXAM: CHEST  2 VIEW COMPARISON:  05/13/2015 . FINDINGS: Mediastinum hilar structures normal. Cardiomegaly with bilateral pulmonary interstitial prominence consistent with congestive heart failure. Right lower lobe infiltrate consistent with focal pulmonary edema and/or pneumonia. Small bilateral pleural effusions. IMPRESSION: 1. Congestive heart failure bilateral from interstitial edema and small pleural effusions. 2. Right lower lobe infiltrate consistent with asymmetric pulmonary edema and/or pneumonia. Electronically Signed   By:  Marcello Moores  Register   On: 06/18/2015 14:10   Dg Tibia/fibula Left  06/18/2015  CLINICAL DATA:  Lower extremity edema and ulcerations EXAM: LEFT TIBIA AND FIBULA - 2 VIEW COMPARISON:  None. FINDINGS: Frontal and lateral views were obtained. Patient is status post total knee replacement with the prosthetic components appearing well-seated. No acute fracture or dislocation. No blastic or lytic bone lesions. No abnormal periosteal reaction. Several defects in the soft tissues potentially may represent areas of ulceration. There are multiple areas of arterial vascular calcification. IMPRESSION: Soft tissue defects which may represent areas of ulceration. There is arterial vascular calcification at multiple sites. No erosive change or bony destruction is appreciable. No blastic or lytic bone lesions. No fracture or dislocation. Total knee replacement with prosthetic components appearing well-seated. Electronically Signed   By: Lowella Grip III M.D.   On: 06/18/2015 14:08   Dg Tibia/fibula Right  06/18/2015  CLINICAL DATA:  Lower extremity edema and ulcers. EXAM: RIGHT TIBIA AND FIBULA - 2 VIEW COMPARISON:  No prior. FINDINGS: Total right knee replacement. No evidence of fracture dislocation. No focal bony abnormality. No acute bony abnormality. Soft tissue deformity noted may be related to the patient's history of edema and ulcerations. Peripheral vascular calcification. IMPRESSION: 1. Total right knee replacement. No acute or focal bony abnormality. 2. Soft tissue deformity consistent with patient's history of edema and ulcerations. Peripheral vascular disease. Electronically Signed   By: Marcello Moores  Register   On: 06/18/2015 14:09   Dg Foot Complete Left  06/18/2015  CLINICAL DATA:  Edema and ulcers to bilateral legs EXAM: LEFT FOOT - COMPLETE 3+ VIEW COMPARISON:  None. FINDINGS: No fracture or dislocation is seen. The joint spaces are preserved. Mild soft tissue swelling along the dorsal forefoot. No  radiographic findings of acute osteomyelitis. IMPRESSION: Mild soft tissue swelling along the dorsal forefoot. No radiographic findings of acute osteomyelitis. Electronically Signed   By: Julian Hy M.D.   On: 06/18/2015 14:10   Dg Foot Complete Right  06/18/2015  CLINICAL DATA:  Lower extremity ulceration and infection. EXAM: RIGHT FOOT COMPLETE - 3+ VIEW COMPARISON:  None. FINDINGS: Moderate osteopenia is present. Ulcerations are noted along the plantar surface of the foot near the MTP joints. There is no underlying bone erosion or fracture. The joint is located. Mild diffuse edema is present. IMPRESSION: 1. Ulcerations along the plantar surface the foot without underlying osseous change. 2. Moderate osteopenia. Electronically Signed   By: San Morelle M.D.   On: 06/18/2015 14:10    EKG: Orders placed or performed in visit on 07/29/14  . EKG 12-Lead   EKG in the emergency room revealed A. fib at rate of 87, right axis deviation, nonspecific ST-T changes IMPRESSION AND PLAN:  Active Problems:   Cellulitis and abscess of leg 1. Bilateral lower extremity cellulitis and abscess, white blood cell count is still pending, initiate patient on vancomycin, Cipro and Flagyl, get wound cultures if palliative care involved for further recommendations, as well as wound care and ID 2. Dehydration due to poor by mouth intake, follow oral intake intake, get dietary involved for calorie count 3. Hyponatremia , initiate her on low rate IV fluids following her sodium level closely 4. Acute renal failure, continue IV fluids to get urinalysis to rule out urinary tract infection, get kidney ultrasound to rule out obstruction, follow creatinine in the morning   All the records are reviewed and case discussed with ED provider. Management plans discussed with the patient, family and they are in agreement.  CODE STATUS: Advance Directive Documentation        Most Recent Value   Type of Advance  Directive  Out of facility DNR (pink MOST or yellow form)   Pre-existing out of facility DNR order (yellow form or pink MOST form)     "MOST" Form in Place?         TOTAL TIME TAKING CARE OF THIS PATIENT: 60 minutes.    Theodoro Grist M.D on 06/18/2015 at 3:09 PM  Between 7am to 6pm - Pager - [phone] After 6pm go to [email or web address] - password [email or web address] [facility]  Office  [phone]  CC: Primary care physician; Viviana Simpler, MD

## 2015-06-19 DIAGNOSIS — L8990 Pressure ulcer of unspecified site, unspecified stage: Secondary | ICD-10-CM | POA: Insufficient documentation

## 2015-06-19 LAB — CBC
HCT: 20.8 % — ABNORMAL LOW (ref 35.0–47.0)
Hemoglobin: 6.8 g/dL — ABNORMAL LOW (ref 12.0–16.0)
MCH: 26.9 pg (ref 26.0–34.0)
MCHC: 32.6 g/dL (ref 32.0–36.0)
MCV: 82.6 fL (ref 80.0–100.0)
PLATELETS: 289 10*3/uL (ref 150–440)
RBC: 2.51 MIL/uL — AB (ref 3.80–5.20)
RDW: 20.3 % — ABNORMAL HIGH (ref 11.5–14.5)
WBC: 10.5 10*3/uL (ref 3.6–11.0)

## 2015-06-19 LAB — BASIC METABOLIC PANEL
Anion gap: 6 (ref 5–15)
BUN: 62 mg/dL — ABNORMAL HIGH (ref 6–20)
CHLORIDE: 101 mmol/L (ref 101–111)
CO2: 24 mmol/L (ref 22–32)
CREATININE: 2.11 mg/dL — AB (ref 0.44–1.00)
Calcium: 7.5 mg/dL — ABNORMAL LOW (ref 8.9–10.3)
GFR calc non Af Amer: 20 mL/min — ABNORMAL LOW (ref >60)
GFR, EST AFRICAN AMERICAN: 23 mL/min — AB (ref >60)
Glucose, Bld: 79 mg/dL (ref 65–99)
POTASSIUM: 4.6 mmol/L (ref 3.5–5.1)
SODIUM: 131 mmol/L — AB (ref 135–145)

## 2015-06-19 LAB — C-REACTIVE PROTEIN: CRP: 3.5 mg/dL — AB (ref <1.0)

## 2015-06-19 LAB — MRSA PCR SCREENING: MRSA BY PCR: NEGATIVE

## 2015-06-19 LAB — PREPARE RBC (CROSSMATCH)

## 2015-06-19 LAB — ABO/RH: ABO/RH(D): O POS

## 2015-06-19 LAB — FERRITIN: Ferritin: 72 ng/mL (ref 11–307)

## 2015-06-19 MED ORDER — LEVOFLOXACIN IN D5W 500 MG/100ML IV SOLN: 500.0000 mg | INTRAVENOUS | Status: DC

## 2015-06-19 MED ORDER — SODIUM CHLORIDE 0.9 % IV SOLN
Freq: Once | INTRAVENOUS | Status: AC
  Administered 2015-06-19: 16:00:00 via INTRAVENOUS

## 2015-06-19 MED ORDER — CETYLPYRIDINIUM CHLORIDE 0.05 % MT LIQD
7.0000 mL | Freq: Two times a day (BID) | OROMUCOSAL | Status: DC
  Administered 2015-06-20 – 2015-06-27 (×14): 7 mL via OROMUCOSAL

## 2015-06-19 MED ORDER — LEVOFLOXACIN IN D5W 250 MG/50ML IV SOLN
250.0000 mg | INTRAVENOUS | Status: DC
  Administered 2015-06-20: 250 mg via INTRAVENOUS
  Filled 2015-06-19 (×2): qty 50

## 2015-06-19 MED ORDER — LEVOFLOXACIN IN D5W 500 MG/100ML IV SOLN
500.0000 mg | Freq: Once | INTRAVENOUS | Status: AC
  Administered 2015-06-19: 15:00:00 500 mg via INTRAVENOUS
  Filled 2015-06-19: qty 100

## 2015-06-19 MED ORDER — FUROSEMIDE 10 MG/ML IJ SOLN
40.0000 mg | Freq: Once | INTRAMUSCULAR | Status: DC
Start: 2015-06-19 — End: 2015-06-19

## 2015-06-19 MED ORDER — FUROSEMIDE 10 MG/ML IJ SOLN
40.0000 mg | Freq: Once | INTRAMUSCULAR | Status: AC
  Administered 2015-06-19: 40 mg via INTRAVENOUS
  Filled 2015-06-19: qty 4

## 2015-06-19 MED ORDER — SODIUM CHLORIDE 0.9 % IV BOLUS (SEPSIS)
500.0000 mL | Freq: Once | INTRAVENOUS | Status: AC
  Administered 2015-06-19: 500 mL via INTRAVENOUS

## 2015-06-19 MED ORDER — METHYLNALTREXONE BROMIDE 12 MG/0.6ML ~~LOC~~ SOLN
12.0000 mg | Freq: Once | SUBCUTANEOUS | Status: AC
  Administered 2015-06-19: 12:00:00 12 mg via SUBCUTANEOUS
  Filled 2015-06-19: qty 0.6

## 2015-06-19 MED ORDER — DEXTROSE 5 % IV SOLN
1.0000 g | Freq: Two times a day (BID) | INTRAVENOUS | Status: DC
  Administered 2015-06-19 – 2015-06-20 (×3): 1 g via INTRAVENOUS
  Filled 2015-06-19 (×5): qty 1

## 2015-06-19 MED ORDER — FUROSEMIDE 10 MG/ML IJ SOLN
40.0000 mg | Freq: Once | INTRAMUSCULAR | Status: AC
  Administered 2015-06-19: 15:00:00 40 mg via INTRAVENOUS
  Filled 2015-06-19: qty 4

## 2015-06-19 NOTE — Clinical Documentation Improvement (Signed)
Internal Medicine  Can the diagnosis of "altered mental status" be further specified? Thank You   Encephalopathy - Alcoholic, Anoxic/Hypoxia, Drug Induced/Toxic (specify drug), Hepatic, Hypertensive, Hypoglycemic, Metabolic/Septic, Traumatic/post concussive, Wernicke, Other  Other  Clinically Undetermined  Document any associated diagnoses/conditions.   Supporting Information:  Altered Mental status, Cellulitis , PNA    Please exercise your independent, professional judgment when responding. A specific answer is not anticipated or expected.   Thank You,  [place] [phone]

## 2015-06-19 NOTE — Care Management (Signed)
Admitted to [facility] with the diagnosis of cellulitis and abscess of legs. Discharged from this facility to [facility] 05/13/15. Husband is Jeneen Rinks [phone]). Lived at home with husband prior to being admitted to [facility]. Ambulatory prior to this admission. Sees Dr. Silvio Pate at [facility]. No home oxygen No home oxygen. No other skilled nursing facilities. Did fall in the bathroom prior to this admission while being assisted with putting on pants. Poor appetite. Only eats about 50% of one meal a day. Comes to Dr. Bunnie Domino office every Friday for Hyperbartic pressure chamber procedure. Friend at the bedside.  Hgb 6.8 today.  IV Cipro and Vancomycin continues. Sodium level 131 today. Shelbie Ammons RN MSN CCM Care Management [phone]

## 2015-06-19 NOTE — Plan of Care (Signed)
Problem: Education: Goal: Knowledge of  General Education information/materials will improve Outcome: Progressing Plan of care reviewed with pt and niece. Both verbalized understanding. Educated pt about pain scale and pain medications, pt verbalized understanding.  Problem: Safety: Goal: Ability to remain free from injury will improve Outcome: Progressing High fall precautions maintained. Pt remained free of injury during the shift.   Mental status improved during the shift. Pt a&Ox3. Disoriented to time. This am pt c/o sob, O2 sats 89% on 2L o2. Increased O2 to 4L per [place], O2 sats up to 94% with improvement in sob. Hgb 6.8, 1xunit of blood infusing, no transfusion reaction occurred. Dressing changed to lateral LE per order. No c/o pain. Scheduled oxycontin given.      Problem: Nutrition: Goal: Adequate nutrition will be maintained Outcome: Progressing Poor appetite. Encouraged pt to eat and drink. Pt need help with set up.

## 2015-06-19 NOTE — Clinical Social Work Note (Signed)
Clinical Social Worker consulted as pt was admitted from [facility]. Per facility, pt is able to return when stable. Full assessment to follow. CSW will continue to follow.   Darden Dates, MSW, LCSW Clinical Social Worker [phone]

## 2015-06-19 NOTE — Plan of Care (Signed)
Problem: Nutrition: Goal: Adequate nutrition will be maintained Outcome: Progressing Pt more alert and oriented. Morphine PRN for pain with relief. Continues on albumin iv Q 8 hrs. On isolation for MRSA. AFib on monitor. B/P decreased to 86/46, bolus of 500 ml administered per MD's order, B/P increased to 94/54 post bolus. Wound Consult in place. Continue to monitor.

## 2015-06-19 NOTE — NC FL2 (Signed)
[place] LEVEL OF CARE SCREENING TOOL     IDENTIFICATION  Patient Name: [name] Birthdate: 16-Sep-1926 Sex: female Admission Date (Current Location): 06/18/2015  [facility] and [place] Number: Training and development officer and Address:  [facility], [address], [place], [place] [address]      Provider Number: [id number]  Attending Physician Name and Address:  Loletha Grayer, MD  Relative Name and Phone Number:  Sarynity Burgeson Spouse    Current Level of Care: Hospital Recommended Level of Care: [employer] Prior Approval Number:    Date Approved/Denied:   PASRR Number:    Discharge Plan: SNF    Current Diagnoses: Patient Active Problem List   Diagnosis Date Noted  . Pressure ulcer 06/19/2015  . Cellulitis and abscess of leg 06/18/2015  . Ulcers of both lower extremities, limited to breakdown of skin ([place])   . Bilateral leg ulcer ([place]) 05/08/2015  . Venous stasis ulcer of right lower extremity ([place]) 03/31/2015  . Abrasion of head 12/04/2014  . Gastric AVM 11/25/2014  . Mild malnutrition ([place]) 10/11/2014  . COPD (chronic obstructive pulmonary disease) with emphysema ([place]) 10/11/2014  . Chronic kidney disease, stage III (moderate) 10/11/2014  . Rheumatoid arthritis ([place]) 07/29/2014  . Stress due to illness of family member 01/11/2014  . Iron deficiency anemia due to chronic blood loss 01/11/2014  . Gastritis and duodenitis 01/11/2014  . Chronic venous insufficiency 02/02/2013  . Routine general medical examination at a health care facility 05/15/2012  . Other and unspecified hyperlipidemia 09/30/2010  . Osteoarthrosis involving more than one site 09/30/2010  . UNSPECIFIED HYPOTHYROIDISM 04/28/2009  . Atrial fibrillation ([place]) 04/28/2009    Orientation RESPIRATION BLADDER Height & Weight    Self, Time, Situation, Place  O2 Continent 5\' 6"  (167.6 cm) 132 lbs.  BEHAVIORAL SYMPTOMS/MOOD NEUROLOGICAL BOWEL  NUTRITION STATUS      Continent    AMBULATORY STATUS COMMUNICATION OF NEEDS Skin   Extensive Assist Verbally Other (Comment) (Pressure Ulcer and cellutlitis abcess of leg)                       Personal Care Assistance Level of Assistance  Bathing, Feeding, Dressing Bathing Assistance: Maximum assistance Feeding assistance: Maximum assistance Dressing Assistance: Maximum assistance     Functional Limitations Info             SPECIAL CARE FACTORS FREQUENCY                       Contractures      Additional Factors Info  Code Status               Current Medications (06/19/2015):  This is the current hospital active medication list Current Facility-Administered Medications  Medication Dose Route Frequency Provider Last Rate Last Dose  . acetaminophen (TYLENOL) tablet 650 mg  650 mg Oral Q6H PRN Theodoro Grist, MD       Or  . acetaminophen (TYLENOL) suppository 650 mg  650 mg Rectal Q6H PRN Theodoro Grist, MD      . albumin human 25 % solution 12.5 g  12.5 g Intravenous 3 times per day Theodoro Grist, MD   12.5 g at 06/19/15 1333  . apixaban (ELIQUIS) tablet 2.5 mg  2.5 mg Oral BID Theodoro Grist, MD   2.5 mg at 06/19/15 1113  . aztreonam (AZACTAM) 1 g in dextrose 5 % 50 mL IVPB  1 g Intravenous Q12H  Loletha Grayer, MD   1 g at 06/19/15 1208  . bismuth subsalicylate (PEPTO BISMOL) 262 MG/15ML suspension 30 mL  30 mL Oral Q4H PRN Theodoro Grist, MD      . docusate sodium (COLACE) capsule 100 mg  100 mg Oral BID Theodoro Grist, MD   100 mg at 06/19/15 0836  . DULoxetine (CYMBALTA) DR capsule 30 mg  30 mg Oral BID Theodoro Grist, MD   30 mg at 06/19/15 0836  . ferrous sulfate tablet 325 mg  325 mg Oral Daily Theodoro Grist, MD   325 mg at 06/19/15 0836  . folic acid (FOLVITE) tablet 2 mg  2 mg Oral Daily Theodoro Grist, MD   2 mg at 06/19/15 [id number]  . furosemide (LASIX) injection 40 mg  40 mg Intravenous Once Loletha Grayer, MD      . hydroxychloroquine (PLAQUENIL)  tablet 400 mg  400 mg Oral QHS Theodoro Grist, MD   400 mg at 06/18/15 2338  . [START ON 06/20/2015] Levofloxacin (LEVAQUIN) IVPB 250 mg  250 mg Intravenous Q24H Loletha Grayer, MD      . levothyroxine (SYNTHROID, LEVOTHROID) tablet 75 mcg  75 mcg Oral QAC breakfast Theodoro Grist, MD   75 mcg at 06/19/15 0836  . magnesium hydroxide (MILK OF MAGNESIA) suspension 15 mL  15 mL Oral Daily PRN Theodoro Grist, MD      . methotrexate (RHEUMATREX) tablet 12.5 mg  12.5 mg Oral Weekly Theodoro Grist, MD   12.5 mg at 06/19/15 0837  . ondansetron (ZOFRAN) tablet 4 mg  4 mg Oral Q4H PRN Theodoro Grist, MD      . oxyCODONE (Oxy IR/ROXICODONE) immediate release tablet 15 mg  15 mg Oral QID PRN Theodoro Grist, MD      . oxyCODONE (OXYCONTIN) 12 hr tablet 20 mg  20 mg Oral BID Theodoro Grist, MD   20 mg at 06/19/15 1021  . pantoprazole (PROTONIX) EC tablet 40 mg  40 mg Oral QAC breakfast Theodoro Grist, MD   40 mg at 06/19/15 0836  . polyethylene glycol (MIRALAX / GLYCOLAX) packet 17 g  17 g Oral Daily PRN Theodoro Grist, MD      . senna (SENOKOT) tablet 8.6 mg  1 tablet Oral BID Theodoro Grist, MD   8.6 mg at 06/19/15 0837  . vancomycin (VANCOCIN) IVPB 1000 mg/200 mL premix  1,000 mg Intravenous Once Theodoro Grist, MD      . Derrill Memo ON 06/21/2015] vancomycin (VANCOCIN) IVPB 1000 mg/200 mL premix  1,000 mg Intravenous Q48H Theodoro Grist, MD      . vitamin B-12 (CYANOCOBALAMIN) tablet 2,000 mcg  2,000 mcg Oral Daily Theodoro Grist, MD   2,000 mcg at 06/19/15 [id number]     Discharge Medications: Please see discharge summary for a list of discharge medications.  Relevant Imaging Results:  Relevant Lab Results:   Additional Information SS: [id number]  Joana Reamer, LCSW

## 2015-06-19 NOTE — Progress Notes (Signed)
Patient ID: [name], female   DOB: 09-20-1926, 81 y.o.   MRN: [id number] [facility] PROGRESS NOTE  PCP: Viviana Simpler, MD  HPI/Subjective: Patient seen this morning. She has increasing oxygen requirements from 2 L to 5 L. She does have some shortness of breath and cough. Also had some low blood pressure. She has these chronic lower extremity wounds that the vascular surgeon has been following as outpatient. She is also been very constipated. Family is concerned about anxiety medications that were ordered.  Objective: Filed Vitals:   06/19/15 1508 06/19/15 1510  BP: 91/55 105/54  Pulse:    Temp:    Resp:      Filed Weights   06/18/15 1229 06/18/15 1726  Weight: 61.8 kg (136 lb 3.9 oz) 59.875 kg (132 lb)    ROS: Review of Systems  Constitutional: Negative for fever and chills.  Eyes: Negative for blurred vision.  Respiratory: Positive for cough and shortness of breath.   Cardiovascular: Negative for chest pain.  Gastrointestinal: Positive for constipation. Negative for nausea, vomiting, abdominal pain and diarrhea.  Genitourinary: Negative for dysuria.  Musculoskeletal: Positive for myalgias and joint pain.  Neurological: Negative for dizziness and headaches.   Exam: Physical Exam  HENT:  Nose: No mucosal edema.  Mouth/Throat: No oropharyngeal exudate or posterior oropharyngeal edema.  Eyes: Conjunctivae, EOM and lids are normal. Pupils are equal, round, and reactive to light.  Neck: No JVD present. Carotid bruit is not present. No edema present. No thyroid mass and no thyromegaly present.  Cardiovascular: S1 normal and S2 normal.  Exam reveals no gallop.   No murmur heard. Pulses:      Dorsalis pedis pulses are 2+ on the right side, and 2+ on the left side.  Respiratory: No respiratory distress. She has no wheezes. She has no rhonchi. She has rales in the right lower field and the left lower field.  GI: Soft. Bowel sounds are normal. There is no  tenderness.  Musculoskeletal:       Right ankle: She exhibits swelling.       Left ankle: She exhibits swelling.  Lymphadenopathy:    She has no cervical adenopathy.  Neurological: She is alert. No cranial nerve deficit.  Skin: Skin is warm. Nails show no clubbing.  Severe bilateral large ulcerations anterior shins. She has chronic bilateral discoloration.  Psychiatric: She has a normal mood and affect.    Data Reviewed: Basic Metabolic Panel:  Recent Labs Lab 06/18/15 1249 06/19/15 0457  NA 129* 131*  K 5.1 4.6  CL 95* 101  CO2 25 24  GLUCOSE 98 79  BUN 71* 62*  CREATININE 2.29* 2.11*  CALCIUM 8.1* 7.5*   Liver Function Tests:  Recent Labs Lab 06/18/15 1249  AST 49*  ALT 29  ALKPHOS 136*  BILITOT 0.5  PROT 5.8*  ALBUMIN 2.2*   CBC:  Recent Labs Lab 06/18/15 1249 06/18/15 1436 06/19/15 0457  WBC  --  9.8 10.5  HGB 8.0* 8.2* 6.8*  HCT 24.8* 25.3* 20.8*  MCV 79.8* 81.3 82.6  PLT 441* 375 289   BNP (last 3 results)  Recent Labs  06/18/15 1415  BNP 910.0*    Recent Results (from the past 240 hour(s))  Blood culture (routine x 2)     Status: None (Preliminary result)   Collection Time: 06/18/15  2:38 PM  Result Value Ref Range Status   Specimen Description BLOOD LEFT FATTY CASTS  Final   Special Requests BOTTLES DRAWN AEROBIC  AND ANAEROBIC 1CC  Final   Culture NO GROWTH < 24 HOURS  Final   Report Status PENDING  Incomplete  Blood culture (routine x 2)     Status: None (Preliminary result)   Collection Time: 06/18/15  2:38 PM  Result Value Ref Range Status   Specimen Description BLOOD LEFT ARM  Final   Special Requests   Final    BOTTLES DRAWN AEROBIC AND ANAEROBIC 1CCAERO,2CCANAERO   Culture NO GROWTH < 24 HOURS  Final   Report Status PENDING  Incomplete  Urine culture     Status: None (Preliminary result)   Collection Time: 06/18/15  2:38 PM  Result Value Ref Range Status   Specimen Description URINE, RANDOM  Final   Special Requests NONE   Final   Culture NO GROWTH  Final   Report Status PENDING  Incomplete  Wound culture     Status: None (Preliminary result)   Collection Time: 06/18/15  4:05 PM  Result Value Ref Range Status   Specimen Description WOUND  Final   Special Requests Normal  Final   Gram Stain FEW WBC SEEN RARE GRAM NEGATIVE RODS   Final   Culture HOLDING FOR POSSIBLE PATHOGEN  Final   Report Status PENDING  Incomplete  MRSA PCR Screening     Status: None   Collection Time: 06/19/15 10:25 AM  Result Value Ref Range Status   MRSA by PCR NEGATIVE NEGATIVE Final    Comment:        The GeneXpert MRSA Assay (FDA approved for NASAL specimens only), is one component of a comprehensive MRSA colonization surveillance program. It is not intended to diagnose MRSA infection nor to guide or monitor treatment for MRSA infections.      Studies: Dg Chest 2 View  06/18/2015  CLINICAL DATA:  Decreased appetite activity.  Lower extremity edema. EXAM: CHEST  2 VIEW COMPARISON:  05/13/2015 . FINDINGS: Mediastinum hilar structures normal. Cardiomegaly with bilateral pulmonary interstitial prominence consistent with congestive heart failure. Right lower lobe infiltrate consistent with focal pulmonary edema and/or pneumonia. Small bilateral pleural effusions. IMPRESSION: 1. Congestive heart failure bilateral from interstitial edema and small pleural effusions. 2. Right lower lobe infiltrate consistent with asymmetric pulmonary edema and/or pneumonia. Electronically Signed   By: Marcello Moores  Register   On: 06/18/2015 14:10   Dg Tibia/fibula Left  06/18/2015  CLINICAL DATA:  Lower extremity edema and ulcerations EXAM: LEFT TIBIA AND FIBULA - 2 VIEW COMPARISON:  None. FINDINGS: Frontal and lateral views were obtained. Patient is status post total knee replacement with the prosthetic components appearing well-seated. No acute fracture or dislocation. No blastic or lytic bone lesions. No abnormal periosteal reaction. Several defects in  the soft tissues potentially may represent areas of ulceration. There are multiple areas of arterial vascular calcification. IMPRESSION: Soft tissue defects which may represent areas of ulceration. There is arterial vascular calcification at multiple sites. No erosive change or bony destruction is appreciable. No blastic or lytic bone lesions. No fracture or dislocation. Total knee replacement with prosthetic components appearing well-seated. Electronically Signed   By: Lowella Grip III M.D.   On: 06/18/2015 14:08   Dg Tibia/fibula Right  06/18/2015  CLINICAL DATA:  Lower extremity edema and ulcers. EXAM: RIGHT TIBIA AND FIBULA - 2 VIEW COMPARISON:  No prior. FINDINGS: Total right knee replacement. No evidence of fracture dislocation. No focal bony abnormality. No acute bony abnormality. Soft tissue deformity noted may be related to the patient's history of edema and ulcerations. Peripheral  vascular calcification. IMPRESSION: 1. Total right knee replacement. No acute or focal bony abnormality. 2. Soft tissue deformity consistent with patient's history of edema and ulcerations. Peripheral vascular disease. Electronically Signed   By: Marcello Moores  Register   On: 06/18/2015 14:09   Dg Foot Complete Left  06/18/2015  CLINICAL DATA:  Edema and ulcers to bilateral legs EXAM: LEFT FOOT - COMPLETE 3+ VIEW COMPARISON:  None. FINDINGS: No fracture or dislocation is seen. The joint spaces are preserved. Mild soft tissue swelling along the dorsal forefoot. No radiographic findings of acute osteomyelitis. IMPRESSION: Mild soft tissue swelling along the dorsal forefoot. No radiographic findings of acute osteomyelitis. Electronically Signed   By: Julian Hy M.D.   On: 06/18/2015 14:10   Dg Foot Complete Right  06/18/2015  CLINICAL DATA:  Lower extremity ulceration and infection. EXAM: RIGHT FOOT COMPLETE - 3+ VIEW COMPARISON:  None. FINDINGS: Moderate osteopenia is present. Ulcerations are noted along the  plantar surface of the foot near the MTP joints. There is no underlying bone erosion or fracture. The joint is located. Mild diffuse edema is present. IMPRESSION: 1. Ulcerations along the plantar surface the foot without underlying osseous change. 2. Moderate osteopenia. Electronically Signed   By: San Morelle M.D.   On: 06/18/2015 14:10    Scheduled Meds: . sodium chloride   Intravenous Once  . albumin human  12.5 g Intravenous 3 times per day  . apixaban  2.5 mg Oral BID  . aztreonam  1 g Intravenous Q12H  . docusate sodium  100 mg Oral BID  . DULoxetine  30 mg Oral BID  . ferrous sulfate  325 mg Oral Daily  . folic acid  2 mg Oral Daily  . furosemide  40 mg Intravenous Once  . hydroxychloroquine  400 mg Oral QHS  . [START ON 06/20/2015] levofloxacin (LEVAQUIN) IV  250 mg Intravenous Q24H  . levothyroxine  75 mcg Oral QAC breakfast  . methotrexate  12.5 mg Oral Weekly  . oxyCODONE  20 mg Oral BID  . pantoprazole  40 mg Oral QAC breakfast  . senna  1 tablet Oral BID  . vancomycin  1,000 mg Intravenous Once  . [START ON 06/21/2015] vancomycin  1,000 mg Intravenous Q48H  . vitamin B-12  2,000 mcg Oral Daily    Assessment/Plan:  1. Acute hypoxic respiratory failure. Patient with increasing oxygen requirements to 2 L to 5 L this morning. Chest x-ray on admission shows fluid versus pneumonia. Continue oxygen supplementation. 2. Right lower lobe pneumonia, bilateral lower extremity ulcerations and infection.- Aggressive antibiotics with vancomycin and Levaquin and aztreonam 3. Possible congestive heart failure- give a dose of IV Lasix today and re-dose after blood transfusion today. Echocardiogram for tomorrow.  4. Acute renal failure, hypotension- patient was given IV fluid hydration. Continue to monitor creatinine with Lasix at this point. 5. Anemia of chronic disease- transfuse 1 unit of packed red blood cells 6. History of atrial fibrillation- on anticoagulation with Eliquis.  May need to stop this medication if blood counts stay low. 7. Chronic pain of the lower extremities continue OxyContin and oxycodone 8. Constipation, likely opioid induced- will give a dose of Relistor 9. Hypothyroidism unspecified continue levothyroxin 10. Gastroesophageal reflux disease without esophagitis continue Protonix 11. History rheumatoid arthritis  Code Status:     Code Status Orders        Start     Ordered   06/19/15 1119  Do not attempt resuscitation (DNR)   Continuous  Question Answer Comment  In the event of cardiac or respiratory ARREST Do not call a "code blue"   In the event of cardiac or respiratory ARREST Do not perform Intubation, CPR, defibrillation or ACLS   In the event of cardiac or respiratory ARREST Use medication by any route, position, wound care, and other measures to relive pain and suffering. May use oxygen, suction and manual treatment of airway obstruction as needed for comfort.   Comments nurse may pronounce      06/19/15 1118    Advance Directive Documentation        Most Recent Value   Type of Advance Directive  Out of facility DNR (pink MOST or yellow form)   Pre-existing out of facility DNR order (yellow form or pink MOST form)     "MOST" Form in Place?       Family Communication: Husband and niece at the bedside  Disposition Plan: To be determined  Antibiotics:  Vancomycin  Levaquin  Aztreonam  Time spent: 25 minutes  Loletha Grayer  [facility]

## 2015-06-19 NOTE — Consult Note (Signed)
[facility] Vascular Consult Note  MRN : [id number]  [name] is a 81 y.o. ([birth year]) female who presents with chief complaint of  Chief Complaint  Patient presents with  . Altered Mental Status   History of Present Illness:  Jacqueline Drummondis a 81 year old female known to our practice with a past medical history of non-healing bilateral lower extremity ulcerations.  05/12/15: Patient s/p bilateral lower extremity irrigation and debridement of skin, soft tissue, and muscle with biopsy and VAC dressing placement.  05/09/15: Aortogram and selective bilateral lower extremity angiograms which was notable for aorta and iliac arteries without significant stenosis. Renal arteries patent. No significant stenosis.Two vessel runoff on the left leg, three vessel runoff on the right leg.  Bilateral venous duplex completed in our office was negative for venous reflux.  Vascular surgery was consulted by primary team (Dr. Ether Griffins) in regard to any further vascular recommendations.   Current Facility-Administered Medications  Medication Dose Route Frequency Provider Last Rate Last Dose  . 0.9 %  sodium chloride infusion   Intravenous Once Loletha Grayer, MD      . acetaminophen (TYLENOL) tablet 650 mg  650 mg Oral Q6H PRN Theodoro Grist, MD       Or  . acetaminophen (TYLENOL) suppository 650 mg  650 mg Rectal Q6H PRN Theodoro Grist, MD      . albumin human 25 % solution 12.5 g  12.5 g Intravenous 3 times per day Theodoro Grist, MD   12.5 g at 06/19/15 1333  . apixaban (ELIQUIS) tablet 2.5 mg  2.5 mg Oral BID Theodoro Grist, MD   2.5 mg at 06/19/15 1113  . aztreonam (AZACTAM) 1 g in dextrose 5 % 50 mL IVPB  1 g Intravenous Q12H Loletha Grayer, MD   1 g at 06/19/15 1208  . bismuth subsalicylate (PEPTO BISMOL) 262 MG/15ML suspension 30 mL  30 mL Oral Q4H PRN Theodoro Grist, MD      . docusate sodium (COLACE) capsule 100 mg  100 mg Oral BID Theodoro Grist, MD   100 mg at  06/19/15 0836  . DULoxetine (CYMBALTA) DR capsule 30 mg  30 mg Oral BID Theodoro Grist, MD   30 mg at 06/19/15 0836  . ferrous sulfate tablet 325 mg  325 mg Oral Daily Theodoro Grist, MD   325 mg at 06/19/15 0836  . folic acid (FOLVITE) tablet 2 mg  2 mg Oral Daily Theodoro Grist, MD   2 mg at 06/19/15 [id number]  . furosemide (LASIX) injection 40 mg  40 mg Intravenous Once Loletha Grayer, MD      . furosemide (LASIX) injection 40 mg  40 mg Intravenous Once Loletha Grayer, MD   40 mg at 06/19/15 1513  . hydroxychloroquine (PLAQUENIL) tablet 400 mg  400 mg Oral QHS Theodoro Grist, MD   400 mg at 06/18/15 2338  . [START ON 06/20/2015] Levofloxacin (LEVAQUIN) IVPB 250 mg  250 mg Intravenous Q24H Loletha Grayer, MD      . levofloxacin [facility]) IVPB 500 mg  500 mg Intravenous Once Loletha Grayer, MD   500 mg at 06/19/15 1458  . levothyroxine (SYNTHROID, LEVOTHROID) tablet 75 mcg  75 mcg Oral QAC breakfast Theodoro Grist, MD   75 mcg at 06/19/15 0836  . magnesium hydroxide (MILK OF MAGNESIA) suspension 15 mL  15 mL Oral Daily PRN Theodoro Grist, MD      . methotrexate (RHEUMATREX) tablet 12.5 mg  12.5 mg Oral Weekly Theodoro Grist, MD  12.5 mg at 06/19/15 0837  . ondansetron (ZOFRAN) tablet 4 mg  4 mg Oral Q4H PRN Theodoro Grist, MD      . oxyCODONE (Oxy IR/ROXICODONE) immediate release tablet 15 mg  15 mg Oral QID PRN Theodoro Grist, MD      . oxyCODONE (OXYCONTIN) 12 hr tablet 20 mg  20 mg Oral BID Theodoro Grist, MD   20 mg at 06/19/15 1021  . pantoprazole (PROTONIX) EC tablet 40 mg  40 mg Oral QAC breakfast Theodoro Grist, MD   40 mg at 06/19/15 0836  . polyethylene glycol (MIRALAX / GLYCOLAX) packet 17 g  17 g Oral Daily PRN Theodoro Grist, MD      . senna (SENOKOT) tablet 8.6 mg  1 tablet Oral BID Theodoro Grist, MD   8.6 mg at 06/19/15 0837  . vancomycin (VANCOCIN) IVPB 1000 mg/200 mL premix  1,000 mg Intravenous Once Theodoro Grist, MD      . Derrill Memo ON 06/21/2015] vancomycin (VANCOCIN) IVPB 1000 mg/200 mL  premix  1,000 mg Intravenous Q48H Theodoro Grist, MD      . vitamin B-12 (CYANOCOBALAMIN) tablet 2,000 mcg  2,000 mcg Oral Daily Theodoro Grist, MD   2,000 mcg at 06/19/15 [id number]    Past Medical History  Diagnosis Date  . Arthritis   . Hyperlipidemia   . Hypothyroidism   . Atrial fibrillation ([place])     a. s/p prior DCCV's, now persistent-->Xarelto (CHA2DS2VASc = 3).  . Diverticulosis     a. by colonoscopy, h/o colon adenoma ([place])  . Gastritis and duodenitis     a. 01/2014 EGD: Moderate non-erosive gastritis and mild non-erosive duodenitis.    Past Surgical History  Procedure Laterality Date  . Rotator cuff repair    . Intraocular lens implant, secondary  1996/ 2010  . Knee surgery      lateral  . Tonsillectomy    . Colonoscopy  02/2011    mod diverticulosis ([place])  . Total knee arthroplasty Bilateral   . I&d extremity Bilateral 05/12/2015    Procedure: IRRIGATION AND DEBRIDEMENT EXTREMITY;  Surgeon: Algernon Huxley, MD;  Location: [facility] ORS;  Service: Vascular;  Laterality: Bilateral;  . Peripheral vascular catheterization  05/09/2015    Procedure: Lower Extremity Intervention;  Surgeon: Algernon Huxley, MD;  Location: [place] [facility];  Service: Cardiovascular;;  . Peripheral vascular catheterization N/A 05/09/2015    Procedure: Abdominal Aortogram w/Lower Extremity;  Surgeon: Algernon Huxley, MD;  Location: [place] [facility];  Service: Cardiovascular;  Laterality: N/A;    Social History Social History  Substance Use Topics  . Smoking status: Former Smoker    Types: Cigarettes  . Smokeless tobacco: Never Used  . Alcohol Use: 0.6 oz/week    1 Glasses of wine per week    Family History Family History  Problem Relation Age of Onset  . Heart disease Mother     No family history of non-healing wounds, venous disease or PAD.  Allergies  Allergen Reactions  . Dabigatran Etexilate Mesylate Shortness Of Breath  . Penicillins Anaphylaxis and Other (See Comments)    Has patient  had a PCN reaction causing immediate rash, facial/tongue/throat swelling, SOB or lightheadedness with hypotension: Yes Has patient had a PCN reaction causing severe rash involving mucus membranes or skin necrosis: No Has patient had a PCN reaction that required hospitalization No Has patient had a PCN reaction occurring within the last 10 years: Yes If all of the above answers are "NO", then may proceed  with Cephalosporin use.  Marland Kitchen Coumadin [Warfarin Sodium] Other (See Comments)    Reaction:  Hematoma   . Doxycycline Hyclate [Doxycycline] Itching  . Sulfonamide Derivatives Other (See Comments)    Reaction:  Unknown   . Tape Other (See Comments)    Reaction:  Blisters   . Tramadol Hcl Itching     REVIEW OF SYSTEMS (Negative unless checked)  Constitutional: [] Weight loss  [] Fever  [] Chills Cardiac: [] Chest pain   [] Chest pressure   [] Palpitations   [] Shortness of breath when laying flat   [] Shortness of breath at rest   [] Shortness of breath with exertion. Vascular:  [] Pain in legs with walking   [x] Pain in legs at rest   [x] Pain in legs when laying flat   [] Claudication   [] Pain in feet when walking  [x] Pain in feet at rest  [x] Pain in feet when laying flat   [] History of DVT   [] Phlebitis   [x] Swelling in legs   [] Varicose veins   [x] Non-healing ulcers Pulmonary:   [] Uses home oxygen   [] Productive cough   [] Hemoptysis   [] Wheeze  [] COPD   [] Asthma Neurologic:  [] Dizziness  [] Blackouts   [] Seizures   [] History of stroke   [] History of TIA  [] Aphasia   [] Temporary blindness   [] Dysphagia   [] Weakness or numbness in arms   [] Weakness or numbness in legs Musculoskeletal:  [] Arthritis   [] Joint swelling   [] Joint pain   [] Low back pain Hematologic:  [] Easy bruising  [] Easy bleeding   [] Hypercoagulable state   [] Anemic  [] Hepatitis Gastrointestinal:  [] Blood in stool   [] Vomiting blood  [] Gastroesophageal reflux/heartburn   [] Difficulty swallowing. Genitourinary:  [x] Chronic kidney disease    [] Difficult urination  [] Frequent urination  [] Burning with urination   [] Blood in urine Skin:  [] Rashes   [x] Ulcers   [x] Wounds Psychological:  [] History of anxiety   []  History of major depression.  Physical Examination  Filed Vitals:   06/19/15 0910 06/19/15 0914 06/19/15 1508 06/19/15 1510  BP:   91/55 105/54  Pulse:      Temp:      TempSrc:      Resp:      Height:      Weight:      SpO2: 89% 94%     Body mass index is 21.32 kg/(m^2).   Gen:  WD/WN, NAD Head: [place]/AT, No temporalis wasting. Prominent temp pulse not noted. Ear/Nose/Throat: Hearing grossly intact, nares w/o erythema or drainage, oropharynx w/o Erythema/Exudate Eyes: PERRLA, EOMI.  Neck: Supple, no nuchal rigidity.  No bruit or JVD.  Pulmonary:  Good air movement, clear to auscultation bilaterally.  Cardiac: RRR, normal S1, S2, no Murmurs, rubs or gallops. Vascular:  Vessel Right Left  Radial Palpable Palpable  Ulnar Palpable Palpable  Brachial Palpable Palpable  Carotid Palpable, without bruit Palpable, without bruit  Aorta Not palpable N/A  Femoral Palpable Palpable  Popliteal Palpable Palpable  PT Palpable Palpable  DP Palpable Palpable   Lower Left Extremity: ulceration present circumferentially to distal anterior and posterior leg, above malleolus.8cm x 20 cm with a depth of 0.5 cm and a thin layer of adherent fibrin slough to wound bed.  Left dorsal foot with scabbed lesion 7.2 cm x 0.5 cm 100% dry and scabbed. Lower Right Extremity: ulceration circumferentially 7.4 cm x 18 cm x 0.3 cm 100% fibrin slough in wound bed.   Gastrointestinal: soft, non-tender/non-distended. No guarding/reflex. No masses, surgical incisions, or scars. Musculoskeletal: M/S 5/5 throughout.  No deformity or atrophy.  Neurologic: CN 2-12 intact. Pain and light touch intact in extremities.  Symmetrical.  Speech is fluent. Motor exam as listed above. Psychiatric: Judgment intact, Mood & affect appropriate for pt's clinical  situation. Dermatologic: See Above Lymph : No Cervical, Axillary, or Inguinal lymphadenopathy.  CBC Lab Results  Component Value Date   WBC 10.5 06/19/2015   HGB 6.8* 06/19/2015   HCT 20.8* 06/19/2015   MCV 82.6 06/19/2015   PLT 289 06/19/2015   BMET    Component Value Date/Time   NA 131* 06/19/2015 0457   NA 140 03/07/2014 1430   K 4.6 06/19/2015 0457   CL 101 06/19/2015 0457   CO2 24 06/19/2015 0457   GLUCOSE 79 06/19/2015 0457   GLUCOSE 95 03/07/2014 1430   BUN 62* 06/19/2015 0457   BUN 27 03/07/2014 1430   CREATININE 2.11* 06/19/2015 0457   CALCIUM 7.5* 06/19/2015 0457   GFRNONAA 20* 06/19/2015 0457   GFRAA 23* 06/19/2015 0457   Estimated Creatinine Clearance: 17.9 mL/min (by C-G formula based on Cr of 2.11).  COAG Lab Results  Component Value Date   INR 1.23 06/18/2015   INR 1.9 08/16/2013   INR 3.4 07/16/2013   Radiology Dg Chest 2 View  06/18/2015  CLINICAL DATA:  Decreased appetite activity.  Lower extremity edema. EXAM: CHEST  2 VIEW COMPARISON:  05/13/2015 . FINDINGS: Mediastinum hilar structures normal. Cardiomegaly with bilateral pulmonary interstitial prominence consistent with congestive heart failure. Right lower lobe infiltrate consistent with focal pulmonary edema and/or pneumonia. Small bilateral pleural effusions. IMPRESSION: 1. Congestive heart failure bilateral from interstitial edema and small pleural effusions. 2. Right lower lobe infiltrate consistent with asymmetric pulmonary edema and/or pneumonia. Electronically Signed   By: Marcello Moores  Register   On: 06/18/2015 14:10   Dg Tibia/fibula Left  06/18/2015  CLINICAL DATA:  Lower extremity edema and ulcerations EXAM: LEFT TIBIA AND FIBULA - 2 VIEW COMPARISON:  None. FINDINGS: Frontal and lateral views were obtained. Patient is status post total knee replacement with the prosthetic components appearing well-seated. No acute fracture or dislocation. No blastic or lytic bone lesions. No abnormal periosteal  reaction. Several defects in the soft tissues potentially may represent areas of ulceration. There are multiple areas of arterial vascular calcification. IMPRESSION: Soft tissue defects which may represent areas of ulceration. There is arterial vascular calcification at multiple sites. No erosive change or bony destruction is appreciable. No blastic or lytic bone lesions. No fracture or dislocation. Total knee replacement with prosthetic components appearing well-seated. Electronically Signed   By: Lowella Grip III M.D.   On: 06/18/2015 14:08   Dg Tibia/fibula Right  06/18/2015  CLINICAL DATA:  Lower extremity edema and ulcers. EXAM: RIGHT TIBIA AND FIBULA - 2 VIEW COMPARISON:  No prior. FINDINGS: Total right knee replacement. No evidence of fracture dislocation. No focal bony abnormality. No acute bony abnormality. Soft tissue deformity noted may be related to the patient's history of edema and ulcerations. Peripheral vascular calcification. IMPRESSION: 1. Total right knee replacement. No acute or focal bony abnormality. 2. Soft tissue deformity consistent with patient's history of edema and ulcerations. Peripheral vascular disease. Electronically Signed   By: Marcello Moores  Register   On: 06/18/2015 14:09   Dg Foot Complete Left  06/18/2015  CLINICAL DATA:  Edema and ulcers to bilateral legs EXAM: LEFT FOOT - COMPLETE 3+ VIEW COMPARISON:  None. FINDINGS: No fracture or dislocation is seen. The joint spaces are preserved. Mild soft tissue swelling along the dorsal forefoot. No radiographic findings of acute  osteomyelitis. IMPRESSION: Mild soft tissue swelling along the dorsal forefoot. No radiographic findings of acute osteomyelitis. Electronically Signed   By: Julian Hy M.D.   On: 06/18/2015 14:10   Dg Foot Complete Right  06/18/2015  CLINICAL DATA:  Lower extremity ulceration and infection. EXAM: RIGHT FOOT COMPLETE - 3+ VIEW COMPARISON:  None. FINDINGS: Moderate osteopenia is present.  Ulcerations are noted along the plantar surface of the foot near the MTP joints. There is no underlying bone erosion or fracture. The joint is located. Mild diffuse edema is present. IMPRESSION: 1. Ulcerations along the plantar surface the foot without underlying osseous change. 2. Moderate osteopenia. Electronically Signed   By: San Morelle M.D.   On: 06/18/2015 14:10   Assessment/Plan The patient is an 81 year old female known to our practice with a history of non-healing bilateral lower extremity ulcerations.  05/12/15: Patient s/p bilateral lower extremity irrigation and debridement of skin, soft tissue, and muscle with biopsy and VAC dressing placement.  05/09/15: Aortogram and selective bilateral lower extremity angiograms which was notable for aorta and iliac arteries without significant stenosis. Renal arteries patent. No significant stenosis.Two vessel runoff on the left leg, three vessel runoff on the right leg.  Bilateral venous duplex completed in our office was negative for venous reflux.  1) No indication for vascular intervention at this time as the patient has recently undergone a diagnostic bilateral lower extremity angiogram without any hemodynamic significant stenosis. 2) Would continue local wound care. 3) Agree with palliative care consult. 4) Care as per primary team. 5) Discussed with Dr. Francene Castle, PA-C  06/19/2015 3:14 PM

## 2015-06-19 NOTE — Progress Notes (Signed)
Initial Nutrition Assessment   INTERVENTION:   Meals and Snacks: Cater to patient preferences Medical Food Supplement Therapy: will discontinue Ensure at this time as pt on Nectar thick liquids and Ensure Enlive not compliant. Will send Nectar thick Mighty Shakes TID as well as Magic Cup BID. Coordination of Care: will continue to assess for malnutrition   NUTRITION DIAGNOSIS:   Inadequate oral intake related to acute illness as evidenced by meal completion < 25%, per patient/family report.  GOAL:   Patient will meet greater than or equal to 90% of their needs  MONITOR:    (Energy Intake, Electrolyte and Renal Profile, Anthropometrics, Digestive system)  REASON FOR ASSESSMENT:   Malnutrition Screening Tool, Consult  (Diet Order)  ASSESSMENT:   Pt admitted with weakness and Bilateral lower extremity cellulitis and abscess with dehydration secondary to poor po intake. Pt on isolation for MRSA.  Past Medical History  Diagnosis Date  . Arthritis   . Hyperlipidemia   . Hypothyroidism   . Atrial fibrillation ([place])     a. s/p prior DCCV's, now persistent-->Xarelto (CHA2DS2VASc = 3).  . Diverticulosis     a. by colonoscopy, h/o colon adenoma ([place])  . Gastritis and duodenitis     a. 01/2014 EGD: Moderate non-erosive gastritis and mild non-erosive duodenitis.    Diet Order:  DIET - DYS 1 Room service appropriate?: Yes; Fluid consistency:: Nectar Thick    Current Nutrition: Unable to clarify with pt on visit. No tray in room. No family in room on visit. Pt on isolation.  Food/Nutrition-Related History: Unable to clarify with pt on visit. Per MD note, poor po intake. Also note per Case Management pt eats only 50% of one meal a day. Last admission 11/4 RD notes pt drinking Ensure at that time. RD also notes pt recently discharged from [facility] on 05/13/15.   Scheduled Medications:  . sodium chloride   Intravenous Once  . albumin human  12.5 g Intravenous 3 times per day   . apixaban  2.5 mg Oral BID  . aztreonam  1 g Intravenous Q12H  . docusate sodium  100 mg Oral BID  . DULoxetine  30 mg Oral BID  . ferrous sulfate  325 mg Oral Daily  . folic acid  2 mg Oral Daily  . furosemide  40 mg Intravenous Once  . furosemide  40 mg Intravenous Once  . hydroxychloroquine  400 mg Oral QHS  . [START ON 06/20/2015] levofloxacin (LEVAQUIN) IV  250 mg Intravenous Q24H  . levofloxacin (LEVAQUIN) IV  500 mg Intravenous Once  . levothyroxine  75 mcg Oral QAC breakfast  . methotrexate  12.5 mg Oral Weekly  . nystatin  5 mL Oral QID  . oxyCODONE  20 mg Oral BID  . pantoprazole  40 mg Oral QAC breakfast  . senna  1 tablet Oral BID  . vancomycin  1,000 mg Intravenous Once  . [START ON 06/21/2015] vancomycin  1,000 mg Intravenous Q48H  . vitamin B-12  2,000 mcg Oral Daily     Electrolyte/Renal Profile and Glucose Profile:   Recent Labs Lab 06/18/15 1249 06/19/15 0457  NA 129* 131*  K 5.1 4.6  CL 95* 101  CO2 25 24  BUN 71* 62*  CREATININE 2.29* 2.11*  CALCIUM 8.1* 7.5*  GLUCOSE 98 79   Protein Profile:   Recent Labs Lab 06/18/15 1249  ALBUMIN 2.2*    Nutrition-Focused Physical Exam Findings:  Unable to complete Nutrition-Focused physical exam at this time.  Weight Change: Per [facility] pt weight higher than last month. RD notes moderate edema of BLLE recorded     Skin:   (Stage II buttocks pressure ulcer)  Last BM:  unknown  Height:   Ht Readings from Last 1 Encounters:  06/18/15 5\' 6"  (1.676 m)    Weight:   Wt Readings from Last 1 Encounters:  06/18/15 132 lb (59.875 kg)    Wt Readings from Last 10 Encounters:  06/18/15 132 lb (59.875 kg)  05/08/15 111 lb (50.349 kg)  04/15/15 117 lb (53.071 kg)  04/11/15 114 lb (51.71 kg)  03/31/15 113 lb (51.256 kg)  02/02/15 115 lb (52.164 kg)  12/26/14 114 lb (51.71 kg)  12/04/14 117 lb (53.071 kg)  11/25/14 121 lb (54.885 kg)  10/11/14 111 lb 8 oz (50.576 kg)    BMI:  Body mass index is  21.32 kg/(m^2).  Estimated Nutritional Needs:   Kcal:  BEE: 1057kcals, TEE: (IF 1.1-1.3)(AF 1.2) 1394-1647kcals  Protein:  72-90g protein (1.2-1.5g/kg)  Fluid:  1497-1788mL of fluid (25-67mL/kg)  EDUCATION NEEDS:   Education needs no appropriate at this time   [place], RD, LDN Pager [phone]

## 2015-06-19 NOTE — Clinical Documentation Improvement (Signed)
Internal Medicine  Abnormal Lab/Test Results:  BNP 910  Possible Clinical Conditions associated with below indicators:   Acuity: Acute/Chronic/Acute on Chronic  Type : Diastolic/Systolic/ Combined Diastolic/Systolic HF  Other Condition  Cannot Clinically Determine   Supporting Information:  Atrial Fibrillation, Acute renal failure   Treatment Provided:  Ordered IV Lasix 40 mg   Please exercise your independent, professional judgment when responding. A specific answer is not anticipated or expected.   Thank You,  [place] [phone]
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
## 2015-06-19 NOTE — Consult Note (Signed)
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  Was inpatient 05/2015, had vascular intervention and was discharged to [facility] with [facility]) therapy after surgical debridement.  It is unclear why this was discontinued.  The patient has silver impregnated wicking fiber to bilateral lower legs today.   Wound type:Vascular ulcers Pressure Ulcer POA: N/A Measurement:Left leg nonintact ulceration present circumferentially to distal anterior and posterior leg, above malleolus.  8 cm x 20 cm with a depth of 0.5 cm and a thin layer of adherent fibrin slough to wound bed.  LEft dorsal foot with scabbed lesion 7.2 cm x 0.5 cm 100% dry and scabbed Right lower leg with ulceration circumferentially 7.4 cm x 18 cm x 0.3 cm 100% fibrin slough in wound bed.  Wound QF:[id number] slough Drainage (amount, consistency, odor) Moderate serosanguinous  Musty odor.  Periwound:Erythema, dark chronic vascular skin changes.  Scattered scabbed lesions, dry to lateral feet Dressing procedure/placement/frequency:Cleanse bilateral lower legs with soap and water and pat gently dry.  Apply Aquacel Ag silver hydrofiber (moisten lightly before applying) to nonintact lesions.  Cover with kerlix and tape.  Change 06/19/15, then Monday, Wednesday and Friday. [facility] team will follow Domenic Moras RN BSN [facility] Pager [phone]

## 2015-06-20 ENCOUNTER — Inpatient Hospital Stay (INDEPENDENT_AMBULATORY_CARE_PROVIDER_SITE_OTHER)
Admit: 2015-06-20 | Discharge: 2015-06-20 | Disposition: A | Discharge status: Home / Self Care | Payer: Medicare Other | Attending: Internal Medicine | Admitting: Internal Medicine

## 2015-06-20 DIAGNOSIS — I5031 Acute diastolic (congestive) heart failure: Secondary | ICD-10-CM

## 2015-06-20 LAB — BASIC METABOLIC PANEL
ANION GAP: 6 (ref 5–15)
BUN: 53 mg/dL — AB (ref 6–20)
CALCIUM: 7.9 mg/dL — AB (ref 8.9–10.3)
CO2: 23 mmol/L (ref 22–32)
Chloride: 105 mmol/L (ref 101–111)
Creatinine, Ser: 1.69 mg/dL — ABNORMAL HIGH (ref 0.44–1.00)
GFR calc Af Amer: 30 mL/min — ABNORMAL LOW (ref >60)
GFR calc non Af Amer: 26 mL/min — ABNORMAL LOW (ref >60)
GLUCOSE: 90 mg/dL (ref 65–99)
Potassium: 3.9 mmol/L (ref 3.5–5.1)
Sodium: 134 mmol/L — ABNORMAL LOW (ref 135–145)

## 2015-06-20 LAB — URINE CULTURE: CULTURE: NO GROWTH

## 2015-06-20 LAB — TYPE AND SCREEN
ABO/RH(D): O POS
Antibody Screen: NEGATIVE
Unit division: 0

## 2015-06-20 LAB — HEMOGLOBIN: HEMOGLOBIN: 7.5 g/dL — AB (ref 12.0–16.0)

## 2015-06-20 MED ORDER — VANCOMYCIN HCL IN DEXTROSE 750-5 MG/150ML-% IV SOLN
750.0000 mg | INTRAVENOUS | Status: DC
  Administered 2015-06-20 – 2015-06-23 (×3): 750 mg via INTRAVENOUS
  Filled 2015-06-20 (×3): qty 150

## 2015-06-20 MED ORDER — METRONIDAZOLE 500 MG PO TABS
500.0000 mg | ORAL_TABLET | Freq: Three times a day (TID) | ORAL | Status: DC
  Administered 2015-06-20 – 2015-06-25 (×14): 500 mg via ORAL
  Filled 2015-06-20 (×15): qty 1

## 2015-06-20 NOTE — Progress Notes (Signed)
ANTIBIOTIC CONSULT NOTE -FOLLOW UP   Pharmacy Consult for Vancomycin Day 3 Indication: Cellulitis-purulent, abscess, hx of MRSA cellulitis  Allergies  Allergen Reactions  . Dabigatran Etexilate Mesylate Shortness Of Breath  . Penicillins Anaphylaxis and Other (See Comments)    Has patient had a PCN reaction causing immediate rash, facial/tongue/throat swelling, SOB or lightheadedness with hypotension: Yes Has patient had a PCN reaction causing severe rash involving mucus membranes or skin necrosis: No Has patient had a PCN reaction that required hospitalization No Has patient had a PCN reaction occurring within the last 10 years: Yes If all of the above answers are "NO", then may proceed with Cephalosporin use.  Marland Kitchen Coumadin [Warfarin Sodium] Other (See Comments)    Reaction:  Hematoma   . Doxycycline Hyclate [Doxycycline] Itching  . Sulfonamide Derivatives Other (See Comments)    Reaction:  Unknown   . Tape Other (See Comments)    Reaction:  Blisters   . Tramadol Hcl Itching    Patient Measurements: Height: 5\' 5"  (165.1 cm) Weight: 130 lb 11.2 oz (59.285 kg) IBW/kg (Calculated) : 57 Adjusted Body Weight:   Vital Signs: Temp: 97.9 F (36.6 C) (12/16 0855) Temp Source: Oral (12/16 0855) BP: 108/49 mmHg (12/16 0855) Pulse Rate: 89 (12/16 0855) Intake/Output from previous day: 12/15 0701 - 12/16 0700 In: [id number] [I.V.:915; Blood:336; IV Piggyback:300] Out: -  Intake/Output from this shift:    Labs:  Recent Labs  06/18/15 1249 06/18/15 1436 06/19/15 0457 06/20/15 0821  WBC  --  9.8 10.5  --   HGB 8.0* 8.2* 6.8* 7.5*  PLT 441* 375 289  --   CREATININE 2.29*  --  2.11* 1.69*   Estimated Creatinine Clearance: 21.5 mL/min (by C-G formula based on Cr of 1.69).   Microbiology: Recent Results (from the past 720 hour(s))  Blood culture (routine x 2)     Status: None (Preliminary result)   Collection Time: 06/18/15  2:38 PM  Result Value Ref Range Status   Specimen  Description BLOOD LEFT FATTY CASTS  Final   Special Requests BOTTLES DRAWN AEROBIC AND ANAEROBIC 1CC  Final   Culture NO GROWTH < 24 HOURS  Final   Report Status PENDING  Incomplete  Blood culture (routine x 2)     Status: None (Preliminary result)   Collection Time: 06/18/15  2:38 PM  Result Value Ref Range Status   Specimen Description BLOOD LEFT ARM  Final   Special Requests   Final    BOTTLES DRAWN AEROBIC AND ANAEROBIC 1CCAERO,2CCANAERO   Culture NO GROWTH < 24 HOURS  Final   Report Status PENDING  Incomplete  Urine culture     Status: None (Preliminary result)   Collection Time: 06/18/15  2:38 PM  Result Value Ref Range Status   Specimen Description URINE, RANDOM  Final   Special Requests NONE  Final   Culture NO GROWTH  Final   Report Status PENDING  Incomplete  Wound culture     Status: None (Preliminary result)   Collection Time: 06/18/15  4:05 PM  Result Value Ref Range Status   Specimen Description WOUND  Final   Special Requests Normal  Final   Gram Stain FEW WBC SEEN RARE GRAM NEGATIVE RODS   Final   Culture HOLDING FOR POSSIBLE PATHOGEN  Final   Report Status PENDING  Incomplete  MRSA PCR Screening     Status: None   Collection Time: 06/19/15 10:25 AM  Result Value Ref Range Status   MRSA by  PCR NEGATIVE NEGATIVE Final    Comment:        The GeneXpert MRSA Assay (FDA approved for NASAL specimens only), is one component of a comprehensive MRSA colonization surveillance program. It is not intended to diagnose MRSA infection nor to guide or monitor treatment for MRSA infections.     Medical History: Past Medical History  Diagnosis Date  . Arthritis   . Hyperlipidemia   . Hypothyroidism   . Atrial fibrillation ([place])     a. s/p prior DCCV's, now persistent-->Xarelto (CHA2DS2VASc = 3).  . Diverticulosis     a. by colonoscopy, h/o colon adenoma ([place])  . Gastritis and duodenitis     a. 01/2014 EGD: Moderate non-erosive gastritis and mild non-erosive  duodenitis.    Medications:  Scheduled:  . albumin human  12.5 g Intravenous 3 times per day  . antiseptic oral rinse  7 mL Mouth Rinse BID  . apixaban  2.5 mg Oral BID  . aztreonam  1 g Intravenous Q12H  . docusate sodium  100 mg Oral BID  . DULoxetine  30 mg Oral BID  . ferrous sulfate  325 mg Oral Daily  . folic acid  2 mg Oral Daily  . hydroxychloroquine  400 mg Oral QHS  . levofloxacin (LEVAQUIN) IV  250 mg Intravenous Q24H  . levothyroxine  75 mcg Oral QAC breakfast  . methotrexate  12.5 mg Oral Weekly  . oxyCODONE  20 mg Oral BID  . pantoprazole  40 mg Oral QAC breakfast  . senna  1 tablet Oral BID  . vancomycin  750 mg Intravenous Q36H  . vitamin B-12  2,000 mcg Oral Daily   Anti-infectives    Start     Dose/Rate Route Frequency Ordered Stop   06/21/15 1800  vancomycin (VANCOCIN) IVPB 1000 mg/200 mL premix  Status:  Discontinued     1,000 mg 200 mL/hr over 60 Minutes Intravenous Every 48 hours 06/18/15 1554 06/20/15 0907   06/20/15 1800  Levofloxacin (LEVAQUIN) IVPB 250 mg     250 mg 50 mL/hr over 60 Minutes Intravenous Every 24 hours 06/19/15 1134     06/20/15 1800  vancomycin (VANCOCIN) IVPB 750 mg/150 ml premix     750 mg 150 mL/hr over 60 Minutes Intravenous Every 36 hours 06/20/15 0907     06/19/15 1800  vancomycin (VANCOCIN) IVPB 1000 mg/200 mL premix     1,000 mg 200 mL/hr over 60 Minutes Intravenous  Once 06/18/15 1554 06/19/15 2142   06/19/15 1200  levofloxacin (LEVAQUIN) IVPB 500 mg     500 mg 100 mL/hr over 60 Minutes Intravenous  Once 06/19/15 1134 06/19/15 1558   06/19/15 1130  levofloxacin (LEVAQUIN) IVPB 500 mg  Status:  Discontinued     500 mg 100 mL/hr over 60 Minutes Intravenous Every 24 hours 06/19/15 1120 06/19/15 1131   06/19/15 1130  aztreonam (AZACTAM) 1 g in dextrose 5 % 50 mL IVPB     1 g 100 mL/hr over 30 Minutes Intravenous Every 12 hours 06/19/15 1120     06/18/15 2200  hydroxychloroquine (PLAQUENIL) tablet 400 mg     400 mg Oral  Daily at bedtime 06/18/15 1716     06/18/15 1545  vancomycin (VANCOCIN) IVPB 1000 mg/200 mL premix     1,000 mg 200 mL/hr over 60 Minutes Intravenous  Once 06/18/15 1531 06/18/15 1800   06/18/15 1500  ciprofloxacin (CIPRO) IVPB 400 mg  Status:  Discontinued     400 mg 200 mL/hr over  60 Minutes Intravenous Every 12 hours 06/18/15 1450 06/19/15 1118   06/18/15 1500  metroNIDAZOLE (FLAGYL) IVPB 500 mg  Status:  Discontinued     500 mg 100 mL/hr over 60 Minutes Intravenous Every 8 hours 06/18/15 1450 06/19/15 1118   06/18/15 1315  vancomycin (VANCOCIN) injection 900 mg  Status:  Discontinued     900 mg Intravenous  Once 06/18/15 1309 06/18/15 1531     Assessment: 81 yo Female from SNF with hx of chronic Bilateral LE wounds with AMS,increasing purulence.   Patient on Hydroxychloroquine and Methotrexate at facility. Allergies: PCN(anaphylaxis) Sulfa(unknown), Doxycycline (itching)  04/16/15: Wound cx= MRSA 05/08/15: Wound cx= Pseudomonas (resistant to Imipenem)  06/18/15 Wound cx pending, UA, Ucx, Bcx pending.  Ke 0.018  T1/2 38.5  Vd 43.  Scr 2.29  12/16: Scr: 1.69 mg/dl;  Kel (hr-1): 0.022 Half-life (hrs): 31.51 Vd (liters): 41.51 (factor used: 0.7 L/kg)  Goal of Therapy:  Vancomycin trough level 15-20 mcg/ml  Plan:  Measure antibiotic drug levels at steady state Follow up culture results  Will order Vancomycin 1 gram IV x 1 in ER. Will order 2nd Vancomycin dose of 1 gram ~26 hrs after 1st dose, for stacked dosing. Will then continue with Vancomycin 1 gram IV Q48h.  Will order trough prior to 3rd dose on 12/17 at 1730 (4th day of therapy- not at steady state).  (Note: patient also ordered Cipro/Metronidazole)  12/16: Patient's serum creatinine has improved; therefore based on kinetics. Patient qualifies for Vancomycin 750 mg IV q36 hours. Will order Vancomycin level @ 1730 on 12/19. Level will not be @ CSS. CSS will be achieve prior to the 06:00 dose on 12/20.    Larene Beach, PharmD Clinical Pharmacist 06/20/2015 9:20 AM

## 2015-06-20 NOTE — Progress Notes (Signed)
Patient had no c/o pain this shift VSS Speciality bed ordered and in place  Family at bedside  Continues on IV  ABX

## 2015-06-20 NOTE — Plan of Care (Signed)
Problem: Nutrition: Goal: Adequate nutrition will be maintained Outcome: Progressing Pt alert and oriented. One unit of blood at shift change, tolerated well. Lasix for post blood transfusion. Continues on albumin Q 8 hrs. zofran for nausea with relief. On isolation for MRSA. Family member at bedside. Continue to monitor.

## 2015-06-20 NOTE — Clinical Social Work Note (Signed)
Clinical Social Work Assessment  Patient Details  Name: [name] MRN: [id number] Date of Birth: Jul 25, 1926  Date of referral:  06/20/15               Reason for consult:  Facility Placement                Permission sought to share information with:  Family Supports Permission granted to share information::  Yes, Verbal Permission Granted  Name::     [name] ([place]) , [name] (neice) and Mr. Cavendish (husband)  HCPOA Contact info: [name] [facility])  Cell:  [phone]. Home:  [phone]  Housing/Transportation Living arrangements for the past 2 months:  [place] of Information:  Patient, Spouse, Other (Comment Required) (family ) Patient Interpreter Needed:  None Criminal Activity/Legal Involvement Pertinent to Current Situation/Hospitalization:  No - Comment as needed Significant Relationships:  Spouse, Other Family Members, Friend Lives with:  Facility Resident Do you feel safe going back to the place where you live?  Yes Need for family participation in patient care:  Yes (Comment)  Care giving concerns:  No concerns identified.    Social Worker assessment / plan:  CSW met with pt and family to address consult. Pt was admitted from [facility]. Pt is able to return at discharge. CSW introduced herself and explained role of social work. CSW also explained the process of discharging and returning to  SNF. Pt stated that she was feeling better and would like to return to [facility] when stable. Pt and husband are in agreement. Pt's niece, who is supportive, lives 3 hours away. CSW will update facility. CSW will continue to follow.   Employment status:  Retired Forensic scientist:  Medicare PT Recommendations:  Not assessed at this time [place] / Referral to community resources:  [place]  Patient/Family's Response to care:  Pt and family were appreciative of CSW support.   Patient/Family's Understanding of  and Emotional Response to Diagnosis, Current Treatment, and Prognosis:  Pt understands that pt needs to return to SNF.   Emotional Assessment Appearance:  Appears older than stated age Attitude/Demeanor/Rapport:  Other (Appropriate) Affect (typically observed):  Accepting, Pleasant Orientation:  Oriented to Self, Oriented to Place, Oriented to  Time, Oriented to Situation Alcohol / Substance use:  Never Used Psych involvement (Current and /or in the community):  No (Comment)  Discharge Needs  Concerns to be addressed:  No discharge needs identified Readmission within the last 30 days:  Yes Current discharge risk:  None Barriers to Discharge:  No Barriers Identified   Darden Dates, LCSW 06/20/2015, 3:32 PM

## 2015-06-20 NOTE — Progress Notes (Signed)
Patient ID: [name], female   DOB: [birth year], 81 y.o.   MRN: [id number] [facility] PROGRESS NOTE  PCP: Viviana Simpler, MD  HPI/Subjective: Patient states that she's feeling a little bit better today. Does not really complain of shortness of breath. Patient had a bowel movement with the Relistor last night. Still with some cough. Has some leg pain only when bending over.  Objective: Filed Vitals:   06/20/15 0855 06/20/15 1348  BP: 108/49 106/55  Pulse: 89 99  Temp: 97.9 F (36.6 C) 98 F (36.7 C)  Resp: 18 20    Filed Weights   06/18/15 1229 06/18/15 1726 06/19/15 1748  Weight: 61.8 kg (136 lb 3.9 oz) 59.875 kg (132 lb) 59.285 kg (130 lb 11.2 oz)    ROS: Review of Systems  Constitutional: Negative for fever and chills.  Eyes: Negative for blurred vision.  Respiratory: Positive for cough. Negative for shortness of breath.   Cardiovascular: Negative for chest pain.  Gastrointestinal: Negative for nausea, vomiting, abdominal pain, diarrhea and constipation.  Genitourinary: Negative for dysuria.  Musculoskeletal: Positive for joint pain. Negative for myalgias.  Neurological: Negative for dizziness and headaches.   Exam: Physical Exam  HENT:  Nose: No mucosal edema.  Mouth/Throat: No oropharyngeal exudate or posterior oropharyngeal edema.  Eyes: Conjunctivae, EOM and lids are normal. Pupils are equal, round, and reactive to light.  Neck: No JVD present. Carotid bruit is not present. No edema present. No thyroid mass and no thyromegaly present.  Cardiovascular: S1 normal and S2 normal.  Exam reveals no gallop.   No murmur heard. Pulses:      Dorsalis pedis pulses are 2+ on the right side, and 2+ on the left side.  Respiratory: No respiratory distress. She has no wheezes. She has no rhonchi. She has rales in the right lower field and the left lower field.  GI: Soft. Bowel sounds are normal. There is no tenderness.  Musculoskeletal:       Right ankle: She  exhibits swelling.       Left ankle: She exhibits swelling.  Lymphadenopathy:    She has no cervical adenopathy.  Neurological: She is alert. No cranial nerve deficit.  Skin: Skin is warm. Nails show no clubbing.  Severe bilateral large ulcerations anterior shins. She has chronic bilateral discoloration.  Psychiatric: She has a normal mood and affect.    Data Reviewed: Basic Metabolic Panel:  Recent Labs Lab 06/18/15 1249 06/19/15 0457 06/20/15 0821  NA 129* 131* 134*  K 5.1 4.6 3.9  CL 95* 101 105  CO2 25 24 23   GLUCOSE 98 79 90  BUN 71* 62* 53*  CREATININE 2.29* 2.11* 1.69*  CALCIUM 8.1* 7.5* 7.9*   Liver Function Tests:  Recent Labs Lab 06/18/15 1249  AST 49*  ALT 29  ALKPHOS 136*  BILITOT 0.5  PROT 5.8*  ALBUMIN 2.2*   CBC:  Recent Labs Lab 06/18/15 1249 06/18/15 1436 06/19/15 0457 06/20/15 0821  WBC  --  9.8 10.5  --   HGB 8.0* 8.2* 6.8* 7.5*  HCT 24.8* 25.3* 20.8*  --   MCV 79.8* 81.3 82.6  --   PLT 441* 375 289  --    BNP (last 3 results)  Recent Labs  06/18/15 1415  BNP 910.0*    Recent Results (from the past 240 hour(s))  Blood culture (routine x 2)     Status: None (Preliminary result)   Collection Time: 06/18/15  2:38 PM  Result Value Ref Range  Status   Specimen Description BLOOD LEFT FATTY CASTS  Final   Special Requests BOTTLES DRAWN AEROBIC AND ANAEROBIC 1CC  Final   Culture NO GROWTH 2 DAYS  Final   Report Status PENDING  Incomplete  Blood culture (routine x 2)     Status: None (Preliminary result)   Collection Time: 06/18/15  2:38 PM  Result Value Ref Range Status   Specimen Description BLOOD LEFT ARM  Final   Special Requests   Final    BOTTLES DRAWN AEROBIC AND ANAEROBIC 1CCAERO,2CCANAERO   Culture NO GROWTH 2 DAYS  Final   Report Status PENDING  Incomplete  Urine culture     Status: None   Collection Time: 06/18/15  2:38 PM  Result Value Ref Range Status   Specimen Description URINE, RANDOM  Final   Special Requests  NONE  Final   Culture NO GROWTH 2 DAYS  Final   Report Status 06/20/2015 FINAL  Final  Wound culture     Status: None (Preliminary result)   Collection Time: 06/18/15  4:05 PM  Result Value Ref Range Status   Specimen Description WOUND  Final   Special Requests Normal  Final   Gram Stain FEW WBC SEEN RARE GRAM NEGATIVE RODS   Final   Culture   Final    RARE GROWTH STAPHYLOCOCCUS AUREUS SUSCEPTIBILITIES TO FOLLOW    Report Status PENDING  Incomplete  MRSA PCR Screening     Status: None   Collection Time: 06/19/15 10:25 AM  Result Value Ref Range Status   MRSA by PCR NEGATIVE NEGATIVE Final    Comment:        The GeneXpert MRSA Assay (FDA approved for NASAL specimens only), is one component of a comprehensive MRSA colonization surveillance program. It is not intended to diagnose MRSA infection nor to guide or monitor treatment for MRSA infections.      Studies: No results found.  Scheduled Meds: . albumin human  12.5 g Intravenous 3 times per day  . antiseptic oral rinse  7 mL Mouth Rinse BID  . apixaban  2.5 mg Oral BID  . docusate sodium  100 mg Oral BID  . DULoxetine  30 mg Oral BID  . ferrous sulfate  325 mg Oral Daily  . folic acid  2 mg Oral Daily  . hydroxychloroquine  400 mg Oral QHS  . levofloxacin (LEVAQUIN) IV  250 mg Intravenous Q24H  . levothyroxine  75 mcg Oral QAC breakfast  . methotrexate  12.5 mg Oral Weekly  . metroNIDAZOLE  500 mg Oral 3 times per day  . oxyCODONE  20 mg Oral BID  . pantoprazole  40 mg Oral QAC breakfast  . senna  1 tablet Oral BID  . vancomycin  750 mg Intravenous Q36H  . vitamin B-12  2,000 mcg Oral Daily    Assessment/Plan:  1. Acute hypoxic respiratory failure. Patient currently on 4 L of oxygen. Chest x-ray on admission shows fluid versus pneumonia. Continue oxygen supplementation. 2. Right lower lobe pneumonia, bilateral lower extremity ulcerations and infection.- Aggressive antibiotics with vancomycin and Levaquin and  aztreonam. Appreciate ID consultation 3. Possible congestive heart failure- since the patient has better air entry today I will hold off on further dosing of Lasix at this point. Echocardiogram today. 4. Acute renal failure, hypotension- patient's creatinine slowly improving 5. Anemia of chronic disease- transfused 1 unit of packed red blood cells yesterday. Hemoglobin only come up to 7.5  6. History of atrial fibrillation- on anticoagulation  with Eliquis. May need to stop this medication if blood counts stay low. 7. Chronic pain of the lower extremities continue OxyContin and oxycodone 8. Constipation, likely opioid induced- patient had a few bowel movements yesterday after dosing Relistor  9. Hypothyroidism unspecified continue levothyroxine 10. Gastroesophageal reflux disease without esophagitis continue Protonix 11. History rheumatoid arthritis  Code Status:     Code Status Orders        Start     Ordered   06/19/15 1119  Do not attempt resuscitation (DNR)   Continuous    Question Answer Comment  In the event of cardiac or respiratory ARREST Do not call a "code blue"   In the event of cardiac or respiratory ARREST Do not perform Intubation, CPR, defibrillation or ACLS   In the event of cardiac or respiratory ARREST Use medication by any route, position, wound care, and other measures to relive pain and suffering. May use oxygen, suction and manual treatment of airway obstruction as needed for comfort.   Comments nurse may pronounce      06/19/15 1118    Advance Directive Documentation        Most Recent Value   Type of Advance Directive  Out of facility DNR (pink MOST or yellow form)   Pre-existing out of facility DNR order (yellow form or pink MOST form)     "MOST" Form in Place?       Family Communication:  niece at the bedside Disposition Plan: likely back to [facility] skilled nursing   Antibiotics:  Vancomycin  Levaquin  Aztreonam  Time spent: 25  minutes  Loletha Grayer  [facility]

## 2015-06-20 NOTE — Progress Notes (Signed)
Palliative Care Update  Full consult not performed as yet as goals of care appear to be aggressive (at least for now).  Pt continuing to get aggressive care and suspect she will still be here Monday am. I have reviewed the chart and note that plans are for her to return to SNF when ready.  She is DNR status. I should be able to see pt/ perform full consult Monday am.     Kirby Funk, MD
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
## 2015-06-20 NOTE — Care Management Important Message (Signed)
Important Message  Patient Details  Name: CAITLINN VINLUAN MRN: [id number] Date of Birth: [birth year]   Medicare Important Message Given:  Yes    Shelbie Ammons, RN 06/20/2015, 1:03 PM

## 2015-06-20 NOTE — Progress Notes (Signed)
*  PRELIMINARY RESULTS* Echocardiogram 2D Echocardiogram has been performed.  [name] Stills 06/20/2015, 2:46 PM

## 2015-06-20 NOTE — Progress Notes (Signed)
Nutrition Follow-up     INTERVENTION:  Meals and snacks: Cater to pt preferences Medical Nutritional Supplement: will continue to encourage mightyshake and magic cup. Spoke with niece at bedside.  Pt sleeping Coordination of care: Will ask nursing to assist and encourage pt at meal time.   NUTRITION DIAGNOSIS:   Inadequate oral intake related to acute illness as evidenced by meal completion < 25%, per patient/family report.    GOAL:   Patient will meet greater than or equal to 90% of their needs  Not meeting needs  MONITOR:    (Energy Intake, Electrolyte and Renal Profile, Anthropometrics, Digestive system)  REASON FOR ASSESSMENT:   Malnutrition Screening Tool, Consult  (Diet Order)  ASSESSMENT:     Pt sleeping this pm.     Current Nutrition: taking bites per niece. Ate few bites of magic cup.  Overall intake poor    Gastrointestinal Profile: Last BM: 12/15   Scheduled Medications:  . albumin human  12.5 g Intravenous 3 times per day  . antiseptic oral rinse  7 mL Mouth Rinse BID  . apixaban  2.5 mg Oral BID  . aztreonam  1 g Intravenous Q12H  . docusate sodium  100 mg Oral BID  . DULoxetine  30 mg Oral BID  . ferrous sulfate  325 mg Oral Daily  . folic acid  2 mg Oral Daily  . hydroxychloroquine  400 mg Oral QHS  . levofloxacin (LEVAQUIN) IV  250 mg Intravenous Q24H  . levothyroxine  75 mcg Oral QAC breakfast  . methotrexate  12.5 mg Oral Weekly  . oxyCODONE  20 mg Oral BID  . pantoprazole  40 mg Oral QAC breakfast  . senna  1 tablet Oral BID  . vancomycin  750 mg Intravenous Q36H  . vitamin B-12  2,000 mcg Oral Daily         Electrolyte/Renal Profile and Glucose Profile:   Recent Labs Lab 06/18/15 1249 06/19/15 0457 06/20/15 0821  NA 129* 131* 134*  K 5.1 4.6 3.9  CL 95* 101 105  CO2 25 24 23   BUN 71* 62* 53*  CREATININE 2.29* 2.11* 1.69*  CALCIUM 8.1* 7.5* 7.9*  GLUCOSE 98 79 90   Protein Profile:  Recent Labs Lab  06/18/15 1249  ALBUMIN 2.2*        Weight Trend since Admission: Filed Weights   06/18/15 1229 06/18/15 1726 06/19/15 1748  Weight: 136 lb 3.9 oz (61.8 kg) 132 lb (59.875 kg) 130 lb 11.2 oz (59.285 kg)      Diet Order:  DIET - DYS 1 Room service appropriate?: Yes; Fluid consistency:: Nectar Thick  Skin:   (Stage II buttocks pressure ulcer)  Last BM:  unknown  Height:   Ht Readings from Last 1 Encounters:  06/19/15 5\' 5"  (1.651 m)    Weight:   Wt Readings from Last 1 Encounters:  06/19/15 130 lb 11.2 oz (59.285 kg)    Ideal Body Weight:     BMI:  Body mass index is 21.75 kg/(m^2).  Estimated Nutritional Needs:   Kcal:  BEE: 1057kcals, TEE: (IF 1.1-1.3)(AF 1.2) 1394-1647kcals  Protein:  72-90g protein (1.2-1.5g/kg)  Fluid:  1497-17106mL of fluid (25-69mL/kg)  EDUCATION NEEDS:   Education needs no appropriate at this time  [place]. Zenia Resides, [place], [place] (pager)

## 2015-06-21 LAB — BASIC METABOLIC PANEL
ANION GAP: 7 (ref 5–15)
BUN: 52 mg/dL — ABNORMAL HIGH (ref 6–20)
CALCIUM: 8.3 mg/dL — AB (ref 8.9–10.3)
CHLORIDE: 105 mmol/L (ref 101–111)
CO2: 24 mmol/L (ref 22–32)
Creatinine, Ser: 1.67 mg/dL — ABNORMAL HIGH (ref 0.44–1.00)
GFR calc Af Amer: 31 mL/min — ABNORMAL LOW (ref >60)
GFR calc non Af Amer: 27 mL/min — ABNORMAL LOW (ref >60)
GLUCOSE: 77 mg/dL (ref 65–99)
Potassium: 3.7 mmol/L (ref 3.5–5.1)
Sodium: 136 mmol/L (ref 135–145)

## 2015-06-21 LAB — CBC
HCT: 25.1 % — ABNORMAL LOW (ref 35.0–47.0)
HEMOGLOBIN: 7.8 g/dL — AB (ref 12.0–16.0)
MCH: 25.3 pg — AB (ref 26.0–34.0)
MCHC: 31.1 g/dL — AB (ref 32.0–36.0)
MCV: 81.2 fL (ref 80.0–100.0)
Platelets: 456 10*3/uL — ABNORMAL HIGH (ref 150–440)
RBC: 3.1 MIL/uL — AB (ref 3.80–5.20)
RDW: 20 % — ABNORMAL HIGH (ref 11.5–14.5)
WBC: 8.7 10*3/uL (ref 3.6–11.0)

## 2015-06-21 MED ORDER — LEVOFLOXACIN 750 MG PO TABS
750.0000 mg | ORAL_TABLET | ORAL | Status: DC
Start: 2015-06-21 — End: 2015-06-23
  Administered 2015-06-21: 750 mg via ORAL
  Filled 2015-06-21: qty 1

## 2015-06-21 NOTE — Progress Notes (Signed)
Physical Therapy Evaluation Patient Details Name: [name] MRN: [id number] DOB: 01/16/1927 Today's Date: 06/21/2015   History of Present Illness  [name] is a 81 y.o. female with a known history of healing bilateral lower extremity wounds , this post vascular intervention really no significant peripheral vascular disease by Dr. dew approximately 1 month ago, after which patient was sent to [facility] for rehabilitation and wound care, comes back to the hospital with altered mental status, increasing somnolence increasing pain in lower extremities as well as increasing purulence of her chronic wounds. Pt also found to have R LL pna.  Clinical Impression  Pt presents with decreased bed mobility, decreased ambulation, and weakness related to the above medical problems.  Pt was admitted from [facility] and was receiving PT services prior to admission.  Pt was able to transfer supine<>sit with Min A this session and sit<>stand with Mod A.  Pt was able to stand and take a couple steps, but was limited by pain and bleeding from LE wounds and was returned to bed.  Recommend acute PT services to address objective findings.    Follow Up Recommendations SNF    Equipment Recommendations  Other (comment) (defer to next setting)    Recommendations for Other Services       Precautions / Restrictions Precautions Precautions: None;Fall Restrictions Weight Bearing Restrictions: No      Mobility  Bed Mobility Overal bed mobility: Needs Assistance Bed Mobility: Supine to Sit;Sit to Supine     Supine to sit: Min assist;HOB elevated Sit to supine: Min assist;HOB elevated   General bed mobility comments: Extra time to lift and move legs off edge of bed; slow rotation of body with Min A to scoot.  Transfers Overall transfer level: Needs assistance Equipment used: Rolling walker (2 wheeled) Transfers: Sit to/from Stand Sit to Stand: Mod assist         General transfer  comment: Mod A to break gravity with both hands on RW; able to elevate and extend at hips using B UE on RW for support.  Ambulation/Gait Ambulation/Gait assistance: Min assist   Assistive device: Rolling walker (2 wheeled)       General Gait Details: 2 steps forward and back; able to perform static marching holding RW with Min A; amb limited by pain and LE bleeding from wounds.  Stairs            Wheelchair Mobility    Modified Rankin (Stroke Patients Only)       Balance Overall balance assessment: Needs assistance Sitting-balance support: Feet supported   Sitting balance - Comments: Maintains midline with supervision only.   Standing balance support: Bilateral upper extremity supported   Standing balance comment: B UE on RW and Min A to maintain midline.                             Pertinent Vitals/Pain Pain Assessment: 0-10 Pain Location: B LE's especially with movement Pain Intervention(s): Limited activity within patient's tolerance;Patient requesting pain meds-RN notified    Home Living Family/patient expects to be discharged to:: Skilled nursing facility                 Additional Comments: Pt at [facility] after last hospitalization and was receiving PT up until admission.    Prior Function Level of Independence: Needs assistance   Gait / Transfers Assistance Needed: "Learning to walk with PT"; pt in rehab, amb  with PT and RW short distances.     Comments: Pt reports that normally she is able to be active and care for her husband, has been using a SPC recently     Hand Dominance        Extremity/Trunk Assessment   Upper Extremity Assessment: Overall WFL for tasks assessed;Generalized weakness           Lower Extremity Assessment: Generalized weakness;RLE deficits/detail;LLE deficits/detail RLE Deficits / Details: Able to SLR against gravity with slight extension lag.  Decreased muscle power LLE Deficits / Details: Able to  SLR against gravity with slight extension lag. Decreased muscle power.     Communication   Communication: No difficulties  Cognition Arousal/Alertness: Awake/alert Behavior During Therapy: WFL for tasks assessed/performed Overall Cognitive Status: Within Functional Limits for tasks assessed                      General Comments General comments (skin integrity, edema, etc.): B LE wounds from knee to ankle, wrapped with kerlix with exposed areas around malleoli; drainage on bed and blood drainage with feet in dependent position from R LE.    Exercises General Exercises - Lower Extremity Ankle Circles/Pumps: AROM;Both;10 reps Quad Sets: AROM;Both;10 reps Gluteal Sets: AROM;Both;10 reps [name] Arc Quad: AROM;Both;10 reps Heel Slides: PROM;Both;10 reps Hip ABduction/ADduction: AROM;Both;10 reps Straight Leg Raises: AROM;Both;5 reps Hip Flexion/Marching: Strengthening;Both;5 reps      Assessment/Plan    PT Assessment Patient needs continued PT services  PT Diagnosis Difficulty walking;Generalized weakness;Acute pain   PT Problem List Decreased strength;Decreased range of motion;Decreased activity tolerance;Decreased balance;Decreased mobility;Pain  PT Treatment Interventions Gait training;Functional mobility training;Therapeutic activities;Therapeutic exercise;Balance training;Patient/family education   PT Goals (Current goals can be found in the Care Plan section) Acute Rehab PT Goals Patient Stated Goal: To walk again. PT Goal Formulation: With patient Time For Goal Achievement: 07/05/15 Potential to Achieve Goals: Good    Frequency Min 2X/week   Barriers to discharge        Co-evaluation               End of Session Equipment Utilized During Treatment: Gait belt Activity Tolerance: Patient limited by pain Patient left: in bed;with call bell/phone within reach Nurse Communication: Mobility status         Time: 1150-1229 PT Time Calculation (min)  (ACUTE ONLY): 39 min   Charges:   PT Evaluation $Initial PT Evaluation Tier I: 1 Procedure PT Treatments $Therapeutic Exercise: 8-22 mins $Therapeutic Activity: 8-22 mins   PT G Codes:        [name] July 16, 2015, 12:52 PM

## 2015-06-21 NOTE — Progress Notes (Signed)
Pharmacy Antibiotic Follow-up Note  [name] is a 81 y.o. year-old female admitted on 06/18/2015.  The patient is currently on day 4 of Vancomycin, Day 4 Metronidazole, Day 3 Levaquin for Cellulitis/PNA.  Assessment/Plan: ID consulted. Wound cx: +MRSA. Patient on Vancomycin 750mg  IV q36h. Vancomycin trough ordered for 12/19 at 0530. Goal 15-20 mcg/ml. Scr1.67. Follow renal fxn.  PNA-?Aspiration. Patient on Levaquin 250mg  IV Q24h renally adjusted and Metronidazole 500mg  po q8h.  Will transition to PO Levaquin 750mg  Q48h per IV to PO protocol.  Temp (24hrs), Avg:97.7 F (36.5 C), Min:97.4 F (36.3 C), Max:97.8 F (36.6 C)   Recent Labs Lab 06/18/15 1436 06/19/15 0457 06/21/15 0715  WBC 9.8 10.5 8.7    Recent Labs Lab 06/18/15 1249 06/19/15 0457 06/20/15 0821 06/21/15 0447  CREATININE 2.29* 2.11* 1.69* 1.67*   Estimated Creatinine Clearance: 21.8 mL/min (by C-G formula based on Cr of 1.67).    Allergies  Allergen Reactions  . Dabigatran Etexilate Mesylate Shortness Of Breath  . Penicillins Anaphylaxis and Other (See Comments)    Has patient had a PCN reaction causing immediate rash, facial/tongue/throat swelling, SOB or lightheadedness with hypotension: Yes Has patient had a PCN reaction causing severe rash involving mucus membranes or skin necrosis: No Has patient had a PCN reaction that required hospitalization No Has patient had a PCN reaction occurring within the last 10 years: Yes If all of the above answers are "NO", then may proceed with Cephalosporin use.  Marland Kitchen Coumadin [Warfarin Sodium] Other (See Comments)    Reaction:  Hematoma   . Doxycycline Hyclate [Doxycycline] Itching  . Sulfonamide Derivatives Other (See Comments)    Reaction:  Unknown   . Tape Other (See Comments)    Reaction:  Blisters   . Tramadol Hcl Itching    Antimicrobials this admission: Ciprofloxacin  12/14 >> 12/15 Aztreonam 12/15 >> 12/16 Vancomycin  12/14 >>   Metronidazole 12/14 >>    Levaquin  12/15 >>   Levels/dose changes this admission: 12/14 Vancomycin 1 gram IV q48.  12/16 Dose change: Vancomycin 750mg  IV q36h   Microbiology results: 12/14 BCx: NGTD 12/14 UCx: NGTD    Sputum:  12/14 Wound cx: MRSA 12/15 MRSA PCR: Negative  Thank you for allowing pharmacy to be a part of this patient's care.  Danikah Budzik A PharmD 06/21/2015 2:04 PM

## 2015-06-21 NOTE — Progress Notes (Signed)
[place] [facility] [place] NAME: [name]    MR#:  [id number]  DATE OF BIRTH:  Mar 21, 1927  SUBJECTIVE:   Shortness of breath improved since yesterday. Afebrile. Still has some pain in the lower extremities. He is to have a cough which is nonproductive.  REVIEW OF SYSTEMS:    Review of Systems  Constitutional: Negative for fever and chills.  HENT: Negative for congestion and tinnitus.   Eyes: Negative for blurred vision and double vision.  Respiratory: Positive for cough and shortness of breath. Negative for wheezing.   Cardiovascular: Negative for chest pain, orthopnea and PND.  Gastrointestinal: Negative for nausea, vomiting, abdominal pain and diarrhea.  Genitourinary: Negative for dysuria and hematuria.  Neurological: Positive for weakness (generalized). Negative for dizziness, sensory change and focal weakness.  All other systems reviewed and are negative.   Nutrition: Dysphagia 1 Tolerating Diet: Yes Tolerating PT: Evaluation noted   DRUG ALLERGIES:   Allergies  Allergen Reactions  . Dabigatran Etexilate Mesylate Shortness Of Breath  . Penicillins Anaphylaxis and Other (See Comments)    Has patient had a PCN reaction causing immediate rash, facial/tongue/throat swelling, SOB or lightheadedness with hypotension: Yes Has patient had a PCN reaction causing severe rash involving mucus membranes or skin necrosis: No Has patient had a PCN reaction that required hospitalization No Has patient had a PCN reaction occurring within the last 10 years: Yes If all of the above answers are "NO", then may proceed with Cephalosporin use.  Marland Kitchen Coumadin [Warfarin Sodium] Other (See Comments)    Reaction:  Hematoma   . Doxycycline Hyclate [Doxycycline] Itching  . Sulfonamide Derivatives Other (See Comments)    Reaction:  Unknown   . Tape Other (See Comments)    Reaction:  Blisters   . Tramadol Hcl Itching    VITALS:  Blood pressure  117/57, pulse 93, temperature 97.8 F (36.6 C), temperature source Oral, resp. rate 20, height 5\' 5"  (1.651 m), weight 58.741 kg (129 lb 8 oz), SpO2 98 %.  PHYSICAL EXAMINATION:   Physical Exam  GENERAL:  81 y.o.-year-old patient lying in the bed with no acute distress.  EYES: Pupils equal, round, reactive to light and accommodation. No scleral icterus. Extraocular muscles intact.  HEENT: Head atraumatic, normocephalic. Oropharynx and nasopharynx clear.  NECK:  Supple, no jugular venous distention. No thyroid enlargement, no tenderness.  LUNGS: Normal breath sounds bilaterally, minimal end expiratory wheezing, no rales, rhonchi. No use of accessory muscles of respiration.  CARDIOVASCULAR: S1, S2 normal. No murmurs, rubs, or gallops.  ABDOMEN: Soft, nontender, nondistended. Bowel sounds present. No organomegaly or mass.  EXTREMITIES: No cyanosis, clubbing or edema b/l.   Bilateral lower extremities wrapped in Kerlix due to lower extremity ulcers with some minimal drainage presently. NEUROLOGIC: Cranial nerves II through XII are intact. No focal Motor or sensory deficits b/l.  Globally weak PSYCHIATRIC: The patient is alert and oriented x 3. Good affect SKIN: No obvious rash, lesion, or ulcer.    LABORATORY PANEL:   CBC  Recent Labs Lab 06/21/15 0715  WBC 8.7  HGB 7.8*  HCT 25.1*  PLT 456*   ------------------------------------------------------------------------------------------------------------------  Chemistries   Recent Labs Lab 06/18/15 1249  06/21/15 0447  NA 129*  < > 136  K 5.1  < > 3.7  CL 95*  < > 105  CO2 25  < > 24  GLUCOSE 98  < > 77  BUN 71*  < > 52*  CREATININE 2.29*  < >  1.67*  CALCIUM 8.1*  < > 8.3*  AST 49*  --   --   ALT 29  --   --   ALKPHOS 136*  --   --   BILITOT 0.5  --   --   < > = values in this interval not  displayed. ------------------------------------------------------------------------------------------------------------------  Cardiac Enzymes No results for input(s): TROPONINI in the last 168 hours. ------------------------------------------------------------------------------------------------------------------  RADIOLOGY:  No results found.   ASSESSMENT AND PLAN:   81 year old female with past medical history of rheumatoid arthritis, hypothyroidism, chronic fibrillation, hyperlipidemia, chronic pain, who presented to the hospital due to altered mental status and also noted to be in acute renal failure with bilateral weeping lower extremity ulcers.  #1 acute respiratory failure with hypoxia-this was likely secondary to pneumonia/CHF. -Patient was given 1 dose of IV Lasix. Also is currently being maintained on broad-spectrum IV antibiotics and has improved. Continue O2 supplementation and wean as tolerated.  #2 pneumonia-likely aspiration pneumonia. -Continue broad-spectrum IV antibiotics with vancomycin, Levaquin. -Follow sputum cultures. Seen by speech and started on dysphagia 1 diet.  #3 acute renal failure-this was likely secondary to dehydration and also underlying infection. -Patient has been hydrated with IV fluids and her creatinine is improving. Follow BUN and creatinine, renal dose meds.  #4 bilateral lower extremity ulcers/cellulitis-continue aggressive IV Abx. therapy with vancomycin, Levaquin, Flagyl. -Appreciate infectious disease input. Wound culture growing MRSA. Continue local wound care.  #5 hyponatremia-also secondary to poor by mouth intake and dehydration. Improved with IV fluid hydration.  #6 history of rheumatoid arthritis-continue Plaquenil, methotrexate.  #7 hypothyroidism-continue Synthroid.  #8 depression-continue Cymbalta.  #9 GERD-continue Protonix.  Appreciate physical therapy and put and recommended short-term rehabilitation. We'll make Research officer, political party where.  All the records are reviewed and case discussed with Care Management/Social Workerr. Management plans discussed with the patient, family and they are in agreement.  CODE STATUS: DO NOT RESUSCITATE  DVT Prophylaxis: Eliquis  TOTAL TIME TAKING CARE OF THIS PATIENT: 35 minutes.   POSSIBLE D/C IN 2-3 DAYS, DEPENDING ON CLINICAL CONDITION.   Henreitta Leber M.D on 06/21/2015 at 2:29 PM  Between 7am to 6pm - Pager - [phone]  After 6pm go to [email or web address] - password [email or web address] [facility]  Office  [phone]  CC: Primary care physician; Viviana Simpler, MD

## 2015-06-22 LAB — BASIC METABOLIC PANEL
Anion gap: 7 (ref 5–15)
BUN: 54 mg/dL — AB (ref 6–20)
CO2: 26 mmol/L (ref 22–32)
Calcium: 8.5 mg/dL — ABNORMAL LOW (ref 8.9–10.3)
Chloride: 105 mmol/L (ref 101–111)
Creatinine, Ser: 1.7 mg/dL — ABNORMAL HIGH (ref 0.44–1.00)
GFR calc Af Amer: 30 mL/min — ABNORMAL LOW (ref >60)
GFR, EST NON AFRICAN AMERICAN: 26 mL/min — AB (ref >60)
GLUCOSE: 98 mg/dL (ref 65–99)
POTASSIUM: 3.8 mmol/L (ref 3.5–5.1)
Sodium: 138 mmol/L (ref 135–145)

## 2015-06-22 LAB — CBC
HEMATOCRIT: 24.3 % — AB (ref 35.0–47.0)
Hemoglobin: 7.8 g/dL — ABNORMAL LOW (ref 12.0–16.0)
MCH: 26.1 pg (ref 26.0–34.0)
MCHC: 32.2 g/dL (ref 32.0–36.0)
MCV: 81 fL (ref 80.0–100.0)
PLATELETS: 382 10*3/uL (ref 150–440)
RBC: 3 MIL/uL — AB (ref 3.80–5.20)
RDW: 19.9 % — ABNORMAL HIGH (ref 11.5–14.5)
WBC: 7.5 10*3/uL (ref 3.6–11.0)

## 2015-06-22 NOTE — Progress Notes (Signed)
[facility] at [place] NAME: [name]    MR#:  [id number]  DATE OF BIRTH:  10/18/1926  SUBJECTIVE:   Having some bleeding from oral sore on the side of her mouth. Shortness of breath much improved. Afebrile. Having some urinary retention today.  REVIEW OF SYSTEMS:    Review of Systems  Constitutional: Negative for fever and chills.  HENT: Negative for congestion and tinnitus.   Eyes: Negative for blurred vision and double vision.  Respiratory: Positive for cough and shortness of breath. Negative for wheezing.   Cardiovascular: Negative for chest pain, orthopnea and PND.  Gastrointestinal: Negative for nausea, vomiting, abdominal pain and diarrhea.  Genitourinary: Negative for dysuria and hematuria.       Urinary retention  Neurological: Positive for weakness (generalized). Negative for dizziness, sensory change and focal weakness.  All other systems reviewed and are negative.   Nutrition: Dysphagia 1 Tolerating Diet: Yes Tolerating PT: Evaluation noted   DRUG ALLERGIES:   Allergies  Allergen Reactions  . Dabigatran Etexilate Mesylate Shortness Of Breath  . Penicillins Anaphylaxis and Other (See Comments)    Has patient had a PCN reaction causing immediate rash, facial/tongue/throat swelling, SOB or lightheadedness with hypotension: Yes Has patient had a PCN reaction causing severe rash involving mucus membranes or skin necrosis: No Has patient had a PCN reaction that required hospitalization No Has patient had a PCN reaction occurring within the last 10 years: Yes If all of the above answers are "NO", then may proceed with Cephalosporin use.  Marland Kitchen Coumadin [Warfarin Sodium] Other (See Comments)    Reaction:  Hematoma   . Doxycycline Hyclate [Doxycycline] Itching  . Sulfonamide Derivatives Other (See Comments)    Reaction:  Unknown   . Tape Other (See Comments)    Reaction:  Blisters   . Tramadol Hcl Itching     VITALS:  Blood pressure 105/49, pulse 113, temperature 98 F (36.7 C), temperature source Oral, resp. rate 17, height 5\' 5"  (1.651 m), weight 60.056 kg (132 lb 6.4 oz), SpO2 93 %.  PHYSICAL EXAMINATION:   Physical Exam  GENERAL:  81 y.o.-year-old patient lying in the bed with no acute distress.  EYES: Pupils equal, round, reactive to light and accommodation. No scleral icterus. Extraocular muscles intact.  HEENT: Head atraumatic, normocephalic. Oropharynx and nasopharynx clear.  NECK:  Supple, no jugular venous distention. No thyroid enlargement, no tenderness.  LUNGS: Prolonged inspiratory and expiratory phase, minimal end expiratory wheezing, no rales, rhonchi. No use of accessory muscles of respiration.  CARDIOVASCULAR: S1, S2 normal. No murmurs, rubs, or gallops.  ABDOMEN: Soft, nontender, nondistended. Bowel sounds present. No organomegaly or mass.  EXTREMITIES: No cyanosis, clubbing or edema b/l.   Bilateral lower extremities wrapped in Kerlix due to lower extremity ulcers with some minimal drainage presently. NEUROLOGIC: Cranial nerves II through XII are intact. No focal Motor or sensory deficits b/l.  Globally weak PSYCHIATRIC: The patient is alert and oriented x 3. Good affect SKIN: No obvious rash, lesion, or ulcer.    LABORATORY PANEL:   CBC  Recent Labs Lab 06/22/15 0412  WBC 7.5  HGB 7.8*  HCT 24.3*  PLT 382   ------------------------------------------------------------------------------------------------------------------  Chemistries   Recent Labs Lab 06/18/15 1249  06/22/15 0412  NA 129*  < > 138  K 5.1  < > 3.8  CL 95*  < > 105  CO2 25  < > 26  GLUCOSE 98  < > 98  BUN  71*  < > 54*  CREATININE 2.29*  < > 1.70*  CALCIUM 8.1*  < > 8.5*  AST 49*  --   --   ALT 29  --   --   ALKPHOS 136*  --   --   BILITOT 0.5  --   --   < > = values in this interval not  displayed. ------------------------------------------------------------------------------------------------------------------  Cardiac Enzymes No results for input(s): TROPONINI in the last 168 hours. ------------------------------------------------------------------------------------------------------------------  RADIOLOGY:  No results found.   ASSESSMENT AND PLAN:   81 year old female with past medical history of rheumatoid arthritis, hypothyroidism, chronic fibrillation, hyperlipidemia, chronic pain, who presented to the hospital due to altered mental status and also noted to be in acute renal failure with bilateral weeping lower extremity ulcers.  #1 acute respiratory failure with hypoxia-this was likely secondary to pneumonia/CHF. -Slowly improving. Continue to wean O2 as tolerated. -Continue vancomycin, Levaquin for pneumonia. Patient is on dysphagia 1 diet. -Continue Lasix PRN given mild acute renal failure. Will resume baseline Lasix dose the next day or 2.  #2 pneumonia-likely aspiration pneumonia. -Continue broad-spectrum IV antibiotics with vancomycin, Levaquin. -Blood cultures negative so far. Seen by speech and started on dysphagia 1 diet.  #3 acute renal failure-this was likely secondary to dehydration and also underlying infection. -Patient has been hydrated with IV fluids and her creatinine is improving. Follow BUN and creatinine, renal dose meds.  #4 bilateral lower extremity ulcers/cellulitis-continue aggressive IV Abx. therapy with vancomycin, Levaquin, Flagyl. -Appreciate infectious disease input. Wound culture growing MRSA. Continue local wound care. -We'll discuss with infectious disease about switching patient to an appropriate oral antibiotic tomorrow.  #5 hyponatremia-also secondary to poor by mouth intake and dehydration. Improved with IV fluid hydration.  #6 history of rheumatoid arthritis-continue Plaquenil, methotrexate.  #7 hypothyroidism-continue  Synthroid.  #8 depression-continue Cymbalta.  #9 GERD-continue Protonix.  #10 urinary retention-we'll do in and out catheterization today. Follow for further urinary retention and if needed will start on Flomax.  Appreciate physical therapy and put and recommended short-term rehabilitation. We'll make Education officer, museum where.  All the records are reviewed and case discussed with Care Management/Social Workerr. Management plans discussed with the patient, family and they are in agreement.  CODE STATUS: DO NOT RESUSCITATE  DVT Prophylaxis: Eliquis  TOTAL TIME TAKING CARE OF THIS PATIENT: 39minutes.   POSSIBLE D/C IN 2-3 DAYS, DEPENDING ON CLINICAL CONDITION.   Henreitta Leber M.D on 06/22/2015 at 12:56 PM  Between 7am to 6pm - Pager - [phone]  After 6pm go to [email or web address] - password [email or web address] [facility]  Office  [phone]  CC: Primary care physician; Viviana Simpler, MD

## 2015-06-22 NOTE — Progress Notes (Signed)
Patient has not voided this shift, bladder scan greater than 454 ml, spoke to Dr Marcille Blanco, pt is currently refusing to try and void although she can, md aware of situation no intervention at this time, patient will void when needed otherwise call back for new interventions.

## 2015-06-23 LAB — BASIC METABOLIC PANEL
ANION GAP: 7 (ref 5–15)
BUN: 56 mg/dL — ABNORMAL HIGH (ref 6–20)
CALCIUM: 8.5 mg/dL — AB (ref 8.9–10.3)
CHLORIDE: 108 mmol/L (ref 101–111)
CO2: 23 mmol/L (ref 22–32)
Creatinine, Ser: 2.09 mg/dL — ABNORMAL HIGH (ref 0.44–1.00)
GFR calc non Af Amer: 20 mL/min — ABNORMAL LOW (ref >60)
GFR, EST AFRICAN AMERICAN: 24 mL/min — AB (ref >60)
Glucose, Bld: 97 mg/dL (ref 65–99)
Potassium: 3.9 mmol/L (ref 3.5–5.1)
Sodium: 138 mmol/L (ref 135–145)

## 2015-06-23 LAB — CULTURE, BLOOD (ROUTINE X 2)
CULTURE: NO GROWTH
CULTURE: NO GROWTH

## 2015-06-23 MED ORDER — TAMSULOSIN HCL 0.4 MG PO CAPS
0.4000 mg | ORAL_CAPSULE | Freq: Every day | ORAL | Status: DC
  Administered 2015-06-23 – 2015-06-24 (×2): 0.4 mg via ORAL
  Filled 2015-06-23 (×2): qty 1

## 2015-06-23 MED ORDER — LEVOFLOXACIN 500 MG PO TABS
500.0000 mg | ORAL_TABLET | ORAL | Status: DC
  Administered 2015-06-23: 500 mg via ORAL
  Filled 2015-06-23: qty 1
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
## 2015-06-23 NOTE — Plan of Care (Signed)
Problem: Nutrition: Goal: Adequate nutrition will be maintained Outcome: Progressing Pain medications given PRN and scheduled with noted relief. Dressing changed to bilateral legs. Bladder scanned and in and out cath performed with 366ml urine out.

## 2015-06-23 NOTE — Progress Notes (Signed)
[name] at [facility] NAME: [name]    MR#:  [id number]  DATE OF BIRTH:  06-27-1927  SUBJECTIVE:   Still having some urinary retention. Shortness of breath much improved. Afebrile.  Not happy with the dysphagia 1 diet.    REVIEW OF SYSTEMS:    Review of Systems  Constitutional: Negative for fever and chills.  HENT: Negative for congestion and tinnitus.   Eyes: Negative for blurred vision and double vision.  Respiratory: Positive for shortness of breath (improved). Negative for cough and wheezing.   Cardiovascular: Negative for chest pain, orthopnea and PND.  Gastrointestinal: Negative for nausea, vomiting, abdominal pain and diarrhea.  Genitourinary: Negative for dysuria and hematuria.       Urinary retention  Neurological: Positive for weakness (generalized). Negative for dizziness, sensory change and focal weakness.  All other systems reviewed and are negative.   Nutrition: Dysphagia 3 Tolerating Diet: Yes Tolerating PT: Evaluation noted   DRUG ALLERGIES:   Allergies  Allergen Reactions  . Dabigatran Etexilate Mesylate Shortness Of Breath  . Penicillins Anaphylaxis and Other (See Comments)    Has patient had a PCN reaction causing immediate rash, facial/tongue/throat swelling, SOB or lightheadedness with hypotension: Yes Has patient had a PCN reaction causing severe rash involving mucus membranes or skin necrosis: No Has patient had a PCN reaction that required hospitalization No Has patient had a PCN reaction occurring within the last 10 years: Yes If all of the above answers are "NO", then may proceed with Cephalosporin use.  Marland Kitchen Coumadin [Warfarin Sodium] Other (See Comments)    Reaction:  Hematoma   . Doxycycline Hyclate [Doxycycline] Itching  . Sulfonamide Derivatives Other (See Comments)    Reaction:  Unknown   . Tape Other (See Comments)    Reaction:  Blisters   . Tramadol Hcl Itching    VITALS:  Blood  pressure 93/53, pulse 118, temperature 98.2 F (36.8 C), temperature source Oral, resp. rate 17, height 5\' 5"  (1.651 m), weight 60.056 kg (132 lb 6.4 oz), SpO2 90 %.  PHYSICAL EXAMINATION:   Physical Exam  GENERAL:  81 y.o.-year-old patient lying in the bed with no acute distress.  EYES: Pupils equal, round, reactive to light and accommodation. No scleral icterus. Extraocular muscles intact.  HEENT: Head atraumatic, normocephalic. Oropharynx and nasopharynx clear.  NECK:  Supple, no jugular venous distention. No thyroid enlargement, no tenderness.  LUNGS: Prolonged inspiratory and expiratory phase, no rales, rhonchi. No use of accessory muscles of respiration.  CARDIOVASCULAR: S1, S2 normal. No murmurs, rubs, or gallops.  ABDOMEN: Soft, nontender, nondistended. Bowel sounds present. No organomegaly or mass.  EXTREMITIES: No cyanosis, clubbing or edema b/l.   Bilateral lower extremities wrapped in Kerlix due to lower extremity ulcers. NEUROLOGIC: Cranial nerves II through XII are intact. No focal Motor or sensory deficits b/l.  Globally weak PSYCHIATRIC: The patient is alert and oriented x 3. Good affect SKIN: No obvious rash, lesion, + LE ulcers and + Stage 1 sacral ulcer.    LABORATORY PANEL:   CBC  Recent Labs Lab 06/22/15 0412  WBC 7.5  HGB 7.8*  HCT 24.3*  PLT 382   ------------------------------------------------------------------------------------------------------------------  Chemistries   Recent Labs Lab 06/18/15 1249  06/23/15 0539  NA 129*  < > 138  K 5.1  < > 3.9  CL 95*  < > 108  CO2 25  < > 23  GLUCOSE 98  < > 97  BUN 71*  < >  56*  CREATININE 2.29*  < > 2.09*  CALCIUM 8.1*  < > 8.5*  AST 49*  --   --   ALT 29  --   --   ALKPHOS 136*  --   --   BILITOT 0.5  --   --   < > = values in this interval not displayed. ------------------------------------------------------------------------------------------------------------------  Cardiac Enzymes No  results for input(s): TROPONINI in the last 168 hours. ------------------------------------------------------------------------------------------------------------------  RADIOLOGY:  No results found.   ASSESSMENT AND PLAN:   81 year old female with past medical history of rheumatoid arthritis, hypothyroidism, chronic fibrillation, hyperlipidemia, chronic pain, who presented to the hospital due to altered mental status and also noted to be in acute renal failure with bilateral weeping lower extremity ulcers.  #1 acute respiratory failure with hypoxia-this was likely secondary to pneumonia/CHF. -Slowly improving. Continue to wean O2 as tolerated. -Continue vancomycin, Levaquin for pneumonia and will narrow down abx coverage but await ID input.   Patient is on dysphagia 3 diet now -Continue Lasix PRN given mild acute renal failure. Will resume baseline Lasix upon discharge.  #2 pneumonia-likely aspiration pneumonia. -Continue broad-spectrum IV antibiotics with vancomycin, Levaquin. -Blood cultures negative so far. Pt. Was on dysphagia 1 diet and is not happy with it and seen by Speech today and diet advanced.    #3 acute renal failure-this was likely secondary to dehydration and also underlying infection. -Cr. A bit more elevated today and will cont. To monitor. Encourage PO intake.  Hold off on IV fluids for now.    #4 bilateral lower extremity ulcers/cellulitis-continue aggressive IV Abx. therapy with vancomycin, Levaquin, Flagyl. -Appreciate infectious disease input. Wound culture growing MRSA. Continue local wound care. -discussed w/ Dr. Ola Spurr and will narrow down abx therapy today.    #5 hyponatremia-also secondary to poor by mouth intake and dehydration. Improved with IV fluid hydration and will monitor.   #6 history of rheumatoid arthritis-continue Plaquenil, methotrexate.  #7 hypothyroidism-continue Synthroid.  #8 depression-continue Cymbalta.  #9 GERD-continue  Protonix.  #10 urinary retention-still having some urinary retention. Will start on Flomax.  Cont. In & out cath PRN for now.   Possible d/c to SNF tomorrow.   All the records are reviewed and case discussed with Care Management/Social Workerr. Management plans discussed with the patient, family and they are in agreement.  CODE STATUS: DO NOT RESUSCITATE  DVT Prophylaxis: Eliquis  TOTAL TIME TAKING CARE OF THIS PATIENT: 30 minutes.   POSSIBLE D/C IN 1-2 DAYS, DEPENDING ON CLINICAL CONDITION.   Henreitta Leber M.D on 06/23/2015 at 1:43 PM  Between 7am to 6pm - Pager - [phone]  After 6pm go to [email or web address] - password [email or web address] [facility]  Office  [phone]  CC: Primary care physician; Viviana Simpler, MD

## 2015-06-23 NOTE — Care Management Important Message (Signed)
Important Message  Patient Details  Name: [name] MRN: [id number] Date of Birth: 1927/01/26   Medicare Important Message Given:  Yes    Marshell Garfinkel, RN 06/23/2015, 11:40 AM

## 2015-06-23 NOTE — Consult Note (Signed)
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 Was inpatient 05/2015, had vascular intervention and was discharged to [facility] with [facility] therapy after surgical debridement. It is unclear why this was discontinued. The patient has silver impregnated wicking fiber to bilateral lower legs today.  Wound type:Vascular ulcers Pressure Ulcer POA: N/A Has new breakdown to sacrum.  Consistent with pressure and moisture associated skin damage (MASD) Patient had disposable brief on this AM.  Is experiencing urinary retention and no loose stool.  Discussed need to eliminate disposable briefs and underpads to allow skin to breathe.  Therapeutic linen will wick moisture and promote healing.  Must turn and reposition every two hours.  WIll use silicone foam to sacrum/coccyx per protocol.  Measurement:Left leg nonintact ulceration present circumferentially to distal anterior and posterior leg, above malleolus. 8 cm x 20 cm with a depth of 0.5 cm and a thin layer of adherent fibrin slough to wound bed.  LEft dorsal foot with scabbed lesion 7.2 cm x 0.5 cm 100% dry, eschar sloughing away.   Right lower leg with ulceration circumferentially 7.4 cm x 18 cm x 0.3 cm 100% fibrin slough in wound bed.  Wound QF:[id number] slough Drainage (amount, consistency, odor) Moderate serosanguinous Musty odor.  Periwound:Erythema, dark chronic vascular skin changes. Scattered scabbed lesions, dry to lateral feet Dressing procedure/placement/frequency:Moisten old bandages and assess need for pain medication prior to wound care. Cleanse bilateral lower legs with soap and water and pat gently dry. Apply Aquacel Ag silver hydrofiber (moisten lightly before applying) to nonintact lesions. Cover with kerlix and tape. Change Monday and Thursday.   [facility] team will follow Domenic Moras RN BSN [facility] Pager [phone]

## 2015-06-23 NOTE — Progress Notes (Signed)
ANTIBIOTIC CONSULT NOTE -FOLLOW UP   Pharmacy Consult for Vancomycin Day 3 Indication: Cellulitis-purulent, abscess, hx of MRSA cellulitis  Allergies  Allergen Reactions  . Dabigatran Etexilate Mesylate Shortness Of Breath  . Penicillins Anaphylaxis and Other (See Comments)    Has patient had a PCN reaction causing immediate rash, facial/tongue/throat swelling, SOB or lightheadedness with hypotension: Yes Has patient had a PCN reaction causing severe rash involving mucus membranes or skin necrosis: No Has patient had a PCN reaction that required hospitalization No Has patient had a PCN reaction occurring within the last 10 years: Yes If all of the above answers are "NO", then may proceed with Cephalosporin use.  Marland Kitchen Coumadin [Warfarin Sodium] Other (See Comments)    Reaction:  Hematoma   . Doxycycline Hyclate [Doxycycline] Itching  . Sulfonamide Derivatives Other (See Comments)    Reaction:  Unknown   . Tape Other (See Comments)    Reaction:  Blisters   . Tramadol Hcl Itching    Patient Measurements: Height: 5\' 5"  (165.1 cm) Weight: 132 lb 6.4 oz (60.056 kg) IBW/kg (Calculated) : 57 Adjusted Body Weight:   Vital Signs: Temp: 97.8 F (36.6 C) (12/19 1412) Temp Source: Oral (12/19 1412) BP: 131/86 mmHg (12/19 1412) Pulse Rate: 109 (12/19 1412) Intake/Output from previous day: 12/18 0701 - 12/19 0700 In: 38 [IV Piggyback:38] Out: 350 [Urine:350] Intake/Output from this shift:    Labs:  Recent Labs  06/21/15 0447 06/21/15 0715 06/22/15 0412 06/23/15 0539  WBC  --  8.7 7.5  --   HGB  --  7.8* 7.8*  --   PLT  --  456* 382  --   CREATININE 1.67*  --  1.70* 2.09*   Estimated Creatinine Clearance: 17.4 mL/min (by C-G formula based on Cr of 2.09).   Microbiology: Recent Results (from the past 720 hour(s))  Blood culture (routine x 2)     Status: None   Collection Time: 06/18/15  2:38 PM  Result Value Ref Range Status   Specimen Description BLOOD LEFT FATTY CASTS   Final   Special Requests BOTTLES DRAWN AEROBIC AND ANAEROBIC 1CC  Final   Culture NO GROWTH 5 DAYS  Final   Report Status 06/23/2015 FINAL  Final  Blood culture (routine x 2)     Status: None   Collection Time: 06/18/15  2:38 PM  Result Value Ref Range Status   Specimen Description BLOOD LEFT ARM  Final   Special Requests   Final    BOTTLES DRAWN AEROBIC AND ANAEROBIC 1CCAERO,2CCANAERO   Culture NO GROWTH 5 DAYS  Final   Report Status 06/23/2015 FINAL  Final  Urine culture     Status: None   Collection Time: 06/18/15  2:38 PM  Result Value Ref Range Status   Specimen Description URINE, RANDOM  Final   Special Requests NONE  Final   Culture NO GROWTH 2 DAYS  Final   Report Status 06/20/2015 FINAL  Final  Wound culture     Status: None   Collection Time: 06/18/15  4:05 PM  Result Value Ref Range Status   Specimen Description WOUND  Final   Special Requests Normal  Final   Gram Stain FEW WBC SEEN RARE GRAM NEGATIVE RODS   Final   Culture   Final    RARE GROWTH METHICILLIN RESISTANT STAPHYLOCOCCUS AUREUS   Report Status 06/21/2015 FINAL  Final   Organism ID, Bacteria METHICILLIN RESISTANT STAPHYLOCOCCUS AUREUS  Final      Susceptibility   Methicillin resistant  staphylococcus aureus - MIC*    CIPROFLOXACIN >=8 RESISTANT Resistant     ERYTHROMYCIN >=8 RESISTANT Resistant     GENTAMICIN <=0.5 SENSITIVE Sensitive     OXACILLIN >=4 RESISTANT Resistant     VANCOMYCIN 1 SENSITIVE Sensitive     TRIMETH/SULFA <=10 SENSITIVE Sensitive     CLINDAMYCIN <=0.25 SENSITIVE Sensitive     CEFOXITIN SCREEN Value in next row Resistant      POSITIVECEFOXITIN SCREEN - This test may be used to predict mecA-mediated oxacillin resistance, and it is based on the cefoxitin disk screen test.  The cefoxitin screen and oxacillin work in combination to determine the final interpretation reported for oxacillin.     Inducible Clindamycin Value in next row Sensitive      POSITIVECEFOXITIN SCREEN - This test may  be used to predict mecA-mediated oxacillin resistance, and it is based on the cefoxitin disk screen test.  The cefoxitin screen and oxacillin work in combination to determine the final interpretation reported for oxacillin.     * RARE GROWTH METHICILLIN RESISTANT STAPHYLOCOCCUS AUREUS  MRSA PCR Screening     Status: None   Collection Time: 06/19/15 10:25 AM  Result Value Ref Range Status   MRSA by PCR NEGATIVE NEGATIVE Final    Comment:        The GeneXpert MRSA Assay (FDA approved for NASAL specimens only), is one component of a comprehensive MRSA colonization surveillance program. It is not intended to diagnose MRSA infection nor to guide or monitor treatment for MRSA infections.     Medical History: Past Medical History  Diagnosis Date  . Arthritis   . Hyperlipidemia   . Hypothyroidism   . Atrial fibrillation ([place])     a. s/p prior DCCV's, now persistent-->Xarelto (CHA2DS2VASc = 3).  . Diverticulosis     a. by colonoscopy, h/o colon adenoma ([facility])  . Gastritis and duodenitis     a. 01/2014 EGD: Moderate non-erosive gastritis and mild non-erosive duodenitis.    Medications:  Scheduled:  . antiseptic oral rinse  7 mL Mouth Rinse BID  . apixaban  2.5 mg Oral BID  . docusate sodium  100 mg Oral BID  . DULoxetine  30 mg Oral BID  . ferrous sulfate  325 mg Oral Daily  . folic acid  2 mg Oral Daily  . hydroxychloroquine  400 mg Oral QHS  . levofloxacin  500 mg Oral Q48H  . levothyroxine  75 mcg Oral QAC breakfast  . methotrexate  12.5 mg Oral Weekly  . metroNIDAZOLE  500 mg Oral 3 times per day  . oxyCODONE  20 mg Oral BID  . pantoprazole  40 mg Oral QAC breakfast  . senna  1 tablet Oral BID  . tamsulosin  0.4 mg Oral Daily  . vancomycin  750 mg Intravenous Q36H  . vitamin B-12  2,000 mcg Oral Daily   Anti-infectives    Start     Dose/Rate Route Frequency Ordered Stop   06/23/15 1800  levofloxacin (LEVAQUIN) tablet 500 mg     500 mg Oral Every 48 hours 06/23/15  0826     06/21/15 1800  vancomycin (VANCOCIN) IVPB 1000 mg/200 mL premix  Status:  Discontinued     1,000 mg 200 mL/hr over 60 Minutes Intravenous Every 48 hours 06/18/15 1554 06/20/15 0907   06/21/15 1800  levofloxacin (LEVAQUIN) tablet 750 mg  Status:  Discontinued     750 mg Oral Every 48 hours 06/21/15 1427 06/23/15 0826   06/20/15 1800  Levofloxacin (LEVAQUIN) IVPB 250 mg  Status:  Discontinued     250 mg 50 mL/hr over 60 Minutes Intravenous Every 24 hours 06/19/15 1134 06/21/15 1427   06/20/15 1800  vancomycin (VANCOCIN) IVPB 750 mg/150 ml premix     750 mg 150 mL/hr over 60 Minutes Intravenous Every 36 hours 06/20/15 0907     06/20/15 1600  metroNIDAZOLE (FLAGYL) tablet 500 mg     500 mg Oral 3 times per day 06/20/15 1547     06/19/15 1800  vancomycin (VANCOCIN) IVPB 1000 mg/200 mL premix     1,000 mg 200 mL/hr over 60 Minutes Intravenous  Once 06/18/15 1554 06/19/15 2142   06/19/15 1200  levofloxacin (LEVAQUIN) IVPB 500 mg     500 mg 100 mL/hr over 60 Minutes Intravenous  Once 06/19/15 1134 06/19/15 1558   06/19/15 1130  levofloxacin (LEVAQUIN) IVPB 500 mg  Status:  Discontinued     500 mg 100 mL/hr over 60 Minutes Intravenous Every 24 hours 06/19/15 1120 06/19/15 1131   06/19/15 1130  aztreonam (AZACTAM) 1 g in dextrose 5 % 50 mL IVPB  Status:  Discontinued     1 g 100 mL/hr over 30 Minutes Intravenous Every 12 hours 06/19/15 1120 06/20/15 1546   06/18/15 2200  hydroxychloroquine (PLAQUENIL) tablet 400 mg     400 mg Oral Daily at bedtime 06/18/15 1716     06/18/15 1545  vancomycin (VANCOCIN) IVPB 1000 mg/200 mL premix     1,000 mg 200 mL/hr over 60 Minutes Intravenous  Once 06/18/15 1531 06/18/15 1800   06/18/15 1500  ciprofloxacin (CIPRO) IVPB 400 mg  Status:  Discontinued     400 mg 200 mL/hr over 60 Minutes Intravenous Every 12 hours 06/18/15 1450 06/19/15 1118   06/18/15 1500  metroNIDAZOLE (FLAGYL) IVPB 500 mg  Status:  Discontinued     500 mg 100 mL/hr over 60  Minutes Intravenous Every 8 hours 06/18/15 1450 06/19/15 1118   06/18/15 1315  vancomycin (VANCOCIN) injection 900 mg  Status:  Discontinued     900 mg Intravenous  Once 06/18/15 1309 06/18/15 1531     Assessment: 81 yo Female from SNF with hx of chronic Bilateral LE wounds with AMS,increasing purulence.   Patient on Hydroxychloroquine and Methotrexate at facility. Allergies: PCN(anaphylaxis) Sulfa(unknown), Doxycycline (itching)  04/16/15: Wound cx= MRSA 05/08/15: Wound cx= Pseudomonas (resistant to Imipenem)  06/18/15 Wound cx pending, UA, Ucx, Bcx pending.  Ke 0.018  T1/2 38.5  Vd 43.  Scr 2.29  12/16: Scr: 1.69 mg/dl;  Kel (hr-1): 0.022 Half-life (hrs): 31.51 Vd (liters): 41.51 (factor used: 0.7 L/kg)  Goal of Therapy:  Vancomycin trough level 15-20 mcg/ml  Plan:  Measure antibiotic drug levels at steady state Follow up culture results  Will order Vancomycin 1 gram IV x 1 in ER. Will order 2nd Vancomycin dose of 1 gram ~26 hrs after 1st dose, for stacked dosing. Will then continue with Vancomycin 1 gram IV Q48h.  Will order trough prior to 3rd dose on 12/17 at 1730 (4th day of therapy- not at steady state).  (Note: patient also ordered Cipro/Metronidazole)  12/16: Patient's serum creatinine has improved; therefore based on kinetics. Patient qualifies for Vancomycin 750 mg IV q36 hours. Will order Vancomycin level @ 1730 on 12/19. Level will not be @ CSS. CSS will be achieve prior to the 06:00 dose on 12/20.   12/19 :   Vanc trough @ 5:44 = 44 mcg/mL.   Vanc 750 mg IV was hung  on 12/19 @ 17:00 so level was apparently drawn as Vanc was infusing.   Will order random Vanc level for 12/20 @ 8:00 to help guide dosing.     Roddie Riegler D Clinical Pharmacist 06/23/2015 7:09 PM

## 2015-06-23 NOTE — Evaluation (Signed)
Clinical/Bedside Swallow Evaluation Patient Details  Name: [name] MRN: [id number] Date of Birth: 1926-11-02  Today's Date: 06/23/2015 Time: SLP Start Time (ACUTE ONLY): 53 SLP Stop Time (ACUTE ONLY): 1150 SLP Time Calculation (min) (ACUTE ONLY): 60 min  Past Medical History:  Past Medical History  Diagnosis Date  . Arthritis   . Hyperlipidemia   . Hypothyroidism   . Atrial fibrillation ([place])     a. s/p prior DCCV's, now persistent-->Xarelto (CHA2DS2VASc = 3).  . Diverticulosis     a. by colonoscopy, h/o colon adenoma ([facility])  . Gastritis and duodenitis     a. 01/2014 EGD: Moderate non-erosive gastritis and mild non-erosive duodenitis.   Past Surgical History:  Past Surgical History  Procedure Laterality Date  . Rotator cuff repair    . Intraocular lens implant, secondary  1996/ 2010  . Knee surgery      lateral  . Tonsillectomy    . Colonoscopy  02/2011    mod diverticulosis ([facility])  . Total knee arthroplasty Bilateral   . I&d extremity Bilateral 05/12/2015    Procedure: IRRIGATION AND DEBRIDEMENT EXTREMITY;  Surgeon: Algernon Huxley, MD;  Location: [facility];  Service: Vascular;  Laterality: Bilateral;  . Peripheral vascular catheterization  05/09/2015    Procedure: Lower Extremity Intervention;  Surgeon: Algernon Huxley, MD;  Location: [facility];  Service: Cardiovascular;;  . Peripheral vascular catheterization N/A 05/09/2015    Procedure: Abdominal Aortogram w/Lower Extremity;  Surgeon: Algernon Huxley, MD;  Location: [facility];  Service: Cardiovascular;  Laterality: N/A;   HPI:  Pt is a 81 y.o. female with a known history of healing bilateral lower extremity wounds , this post vascular intervention really no significant peripheral vascular disease by Dr. dew approximately 1 month ago, after which patient was sent to [facility] [place] facility for rehabilitation and wound care, comes back to the hospital with altered mental status, increasing somnolence increasing  pain in lower extremities as well as increasing purulence of her chronic wounds. Pt has h/o gastritis and duodenitis. Pt was somnolent at admission but is now more awake/alert and interactive w/ others in room. Pt would like to upgrade her diet; she stated she had never been on a dysphagia diet at home or at SNF recently.    Assessment / Plan / Recommendation Clinical Impression  Pt appeared to adequately tolerate few trials of thin liquids and Dys. 2/3 soft, solid trials w/ no overt s/s of aspiration noted; no significant oral phase deficits noted w/ these consistencies. Pt did require feeding assist but she assisted in holding the cup to feed self. Rec. a Dys. 3 diet w/ thin liquids w/ general aspiration precautions; meds in puree. Tray setup and assistance at meals. Rec. ST f/u for toleration of diet/po's as nec. while admited. Of note, pt is at increased risk for aspiration of reflux material if she experiences any N/V. Rec. possible f/u w/ GI if indicated per MD.    Aspiration Risk   (reduced)    Diet Recommendation  Dys. 3 w/ thin liquids; general aspiration precautions; assistance at meals, tray setup  Medication Administration: Whole meds with puree    Other  Recommendations Recommended Consults: Consider GI evaluation (Detician) Oral Care Recommendations: Oral care BID;Staff/trained caregiver to provide oral care   Follow up Recommendations   (TBD)    Frequency and Duration min 2x/week  1 week       Prognosis Prognosis for Safe Diet Advancement:  (fair-good) Barriers to  Reach Goals:  (medical status)      Swallow Study   General Date of Onset: 06/18/15 HPI: Pt is a 81 y.o. female with a known history of healing bilateral lower extremity wounds , this post vascular intervention really no significant peripheral vascular disease by Dr. dew approximately 1 month ago, after which patient was sent to [facility] [place] facility for rehabilitation and wound care, comes back to the hospital  with altered mental status, increasing somnolence increasing pain in lower extremities as well as increasing purulence of her chronic wounds. Pt has h/o gastritis and duodenitis. Pt was somnolent at admission but is now more awake/alert and interactive w/ others in room. Pt would like to upgrade her diet; she stated she had never been on a dysphagia diet at home or at SNF recently.  Type of Study: Bedside Swallow Evaluation Previous Swallow Assessment: none Diet Prior to this Study: Dysphagia 1 (puree);Nectar-thick liquids Temperature Spikes Noted: No (wbc 7.5) Respiratory Status: Room air History of Recent Intubation: No Behavior/Cognition: Alert;Cooperative;Pleasant mood Oral Cavity Assessment: Within Functional Limits Oral Care Completed by SLP: Recent completion by staff Oral Cavity - Dentition: Adequate natural dentition Vision: Functional for self-feeding Self-Feeding Abilities: Able to feed self;Needs assist;Needs set up Patient Positioning: Upright in bed Baseline Vocal Quality: Normal;Low vocal intensity Volitional Cough: Strong Volitional Swallow: Able to elicit    Oral/Motor/Sensory Function Overall Oral Motor/Sensory Function: Within functional limits   Ice Chips Ice chips: Within functional limits Presentation: Spoon;Self Fed (5 trials)   Thin Liquid Thin Liquid: Within functional limits Presentation: Cup;Self Fed (5-6 trials)    Nectar Thick Nectar Thick Liquid: Not tested   Honey Thick Honey Thick Liquid: Not tested   Puree Puree: Within functional limits Presentation: Spoon (2 trials)   Solid Solid: Within functional limits Presentation:  (2 trials)      Orinda Kenner, MS, CCC-SLP  [name] 06/23/2015,2:59 PM

## 2015-06-24 DIAGNOSIS — A4902 Methicillin resistant Staphylococcus aureus infection, unspecified site: Secondary | ICD-10-CM

## 2015-06-24 DIAGNOSIS — E860 Dehydration: Secondary | ICD-10-CM

## 2015-06-24 DIAGNOSIS — L89152 Pressure ulcer of sacral region, stage 2: Secondary | ICD-10-CM

## 2015-06-24 DIAGNOSIS — N179 Acute kidney failure, unspecified: Secondary | ICD-10-CM

## 2015-06-24 DIAGNOSIS — D509 Iron deficiency anemia, unspecified: Secondary | ICD-10-CM

## 2015-06-24 DIAGNOSIS — D473 Essential (hemorrhagic) thrombocythemia: Secondary | ICD-10-CM

## 2015-06-24 DIAGNOSIS — I482 Chronic atrial fibrillation: Secondary | ICD-10-CM

## 2015-06-24 DIAGNOSIS — J690 Pneumonitis due to inhalation of food and vomit: Secondary | ICD-10-CM

## 2015-06-24 DIAGNOSIS — R630 Anorexia: Secondary | ICD-10-CM

## 2015-06-24 DIAGNOSIS — L03116 Cellulitis of left lower limb: Secondary | ICD-10-CM

## 2015-06-24 LAB — CBC
HEMATOCRIT: 26.2 % — AB (ref 35.0–47.0)
HEMOGLOBIN: 8.5 g/dL — AB (ref 12.0–16.0)
MCH: 26.6 pg (ref 26.0–34.0)
MCHC: 32.6 g/dL (ref 32.0–36.0)
MCV: 81.6 fL (ref 80.0–100.0)
Platelets: 304 10*3/uL (ref 150–440)
RBC: 3.21 MIL/uL — ABNORMAL LOW (ref 3.80–5.20)
RDW: 20.3 % — ABNORMAL HIGH (ref 11.5–14.5)
WBC: 13.2 10*3/uL — AB (ref 3.6–11.0)

## 2015-06-24 LAB — BASIC METABOLIC PANEL
ANION GAP: 7 (ref 5–15)
BUN: 60 mg/dL — ABNORMAL HIGH (ref 6–20)
CHLORIDE: 107 mmol/L (ref 101–111)
CO2: 23 mmol/L (ref 22–32)
Calcium: 8.6 mg/dL — ABNORMAL LOW (ref 8.9–10.3)
Creatinine, Ser: 2.44 mg/dL — ABNORMAL HIGH (ref 0.44–1.00)
GFR calc non Af Amer: 17 mL/min — ABNORMAL LOW (ref >60)
GFR, EST AFRICAN AMERICAN: 20 mL/min — AB (ref >60)
GLUCOSE: 100 mg/dL — AB (ref 65–99)
Potassium: 4.4 mmol/L (ref 3.5–5.1)
Sodium: 137 mmol/L (ref 135–145)

## 2015-06-24 LAB — WOUND CULTURE: Special Requests: NORMAL

## 2015-06-24 MED ORDER — SODIUM CHLORIDE 0.9 % IV SOLN
Freq: Once | INTRAVENOUS | Status: AC
  Administered 2015-06-24: 09:00:00 via INTRAVENOUS

## 2015-06-24 MED ORDER — SODIUM CHLORIDE 0.9 % IV SOLN
INTRAVENOUS | Status: DC
  Administered 2015-06-24 – 2015-06-25 (×3): via INTRAVENOUS

## 2015-06-24 MED ORDER — DOXYCYCLINE HYCLATE 100 MG PO TABS
100.0000 mg | ORAL_TABLET | Freq: Two times a day (BID) | ORAL | Status: DC
  Administered 2015-06-24 – 2015-06-26 (×5): 100 mg via ORAL
  Filled 2015-06-24 (×6): qty 1

## 2015-06-24 MED ORDER — ENSURE ENLIVE PO LIQD: 237.0000 mL | Freq: Two times a day (BID) | ORAL | Status: DC

## 2015-06-24 MED ORDER — CIPROFLOXACIN HCL 500 MG PO TABS
500.0000 mg | ORAL_TABLET | Freq: Every day | ORAL | Status: DC
  Administered 2015-06-24 – 2015-06-25 (×2): 500 mg via ORAL
  Filled 2015-06-24 (×3): qty 1

## 2015-06-24 MED ORDER — ONDANSETRON HCL 4 MG/2ML IJ SOLN
4.0000 mg | Freq: Three times a day (TID) | INTRAMUSCULAR | Status: DC
  Administered 2015-06-25 – 2015-06-26 (×5): 4 mg via INTRAVENOUS
  Filled 2015-06-24 (×5): qty 2

## 2015-06-24 MED ORDER — ENSURE ENLIVE PO LIQD
237.0000 mL | Freq: Three times a day (TID) | ORAL | Status: DC
  Administered 2015-06-25 – 2015-06-26 (×6): 237 mL via ORAL

## 2015-06-24 MED ORDER — BISACODYL 10 MG RE SUPP
10.0000 mg | Freq: Every day | RECTAL | Status: DC | PRN
  Administered 2015-06-25: 10 mg via RECTAL
  Filled 2015-06-24: qty 1

## 2015-06-24 NOTE — Progress Notes (Signed)
Speech Language Pathology Treatment: Dysphagia  Patient Details Name: [name] MRN: [id number] DOB: 24-Jul-1926 Today's Date: 06/24/2015 Time: 0815-0900 SLP Time Calculation (min) (ACUTE ONLY): 45 min  Assessment / Plan / Recommendation Clinical Impression  Pt appeared to adequately tolerate trials of thin liquids and soft solids w/ no immediate, overt s/s of aspiration and no significant oral phase deficits. Pt took her time feeding herself and was encouraged to take rest breaks in order to not feel overwhelmed or SOB. Pt appears at reduced risk for aspiration following general aspiration precautions but is extremely weak w/ declined medical status at this time; pt is more bedbound currently as well. Her medical presentation and episodes of belching/burping(reflux?) could increase her risk for aspiration. Rec. continue w/ current diet as ordered; strict aspiration and reflux precautions; assistance in positioning her upright for any oral intake. Rec. Meds in Puree at this time. NSG updated.    HPI HPI: Pt is a 81 y.o. female with a known history of healing bilateral lower extremity wounds , this post vascular intervention really no significant peripheral vascular disease by Dr. dew approximately 1 month ago, after which patient was sent to [facility] [place] facility for rehabilitation and wound care, comes back to the hospital with altered mental status, increasing somnolence increasing pain in lower extremities as well as increasing purulence of her chronic wounds. Pt has h/o gastritis and duodenitis. Pt was somnolent at admission but is now awake/alert and interactive w/ others in room. Pt appears easily anxious w/ increased activity; encouraged to calm and easy breath to relax. NSG reported pt has been drinking some but not eating much since diet upgrade yesterday.       SLP Plan  Continue with current plan of care     Recommendations  Diet recommendations: Dysphagia 3 (mechanical soft);Thin  liquid Liquids provided via: Cup;Straw Medication Administration: Whole meds with puree Supervision: Patient able to self feed (setup assist) Compensations: Minimize environmental distractions;Slow rate;Small sips/bites Postural Changes and/or Swallow Maneuvers: Seated upright 90 degrees              Oral Care Recommendations: Oral care BID;Staff/trained caregiver to provide oral care Follow up Recommendations:  (TBD) Plan: Continue with current plan of care     [name], [place], CCC-SLP  [name] 06/24/2015, 12:13 PM

## 2015-06-24 NOTE — Plan of Care (Signed)
Problem: Nutrition: Goal: Adequate nutrition will be maintained Outcome: Not Progressing Pt with very poor appetite. Supplements ordered. Pt encouraged to eat but declined this shift. She did take sips of water and ginger ale. Scheduled pain meds given. Pt niece [name] called and was given an update on patient condition.

## 2015-06-24 NOTE — Progress Notes (Addendum)
Dr Megan Salon notified verbally that patient was not voiding. Bladder Scanned 263 and 280.

## 2015-06-24 NOTE — Progress Notes (Signed)
PT Cancellation Note  Patient Details Name: [name] MRN: [id number] DOB: 1927-01-05   Cancelled Treatment:    Reason Eval/Treat Not Completed: Patient at procedure or test/unavailable (Treatment session attempted.  Patient currently in meeting with palliative care and unavailable for session.  Will re-attempt at later time/date as patient available and medically appropriate.)   [name] H. Owens Shark, PT, DPT, NCS 06/24/2015, 3:51 PM [phone]

## 2015-06-24 NOTE — Plan of Care (Signed)
Problem: SLP Dysphagia Goals Goal: Misc Dysphagia Goal Pt will safely tolerate po diet of least restrictive consistency w/ no overt s/s of aspiration noted by Staff/pt/family x3 sessions.    

## 2015-06-24 NOTE — Progress Notes (Signed)
[facility] [place] NAME: [name]    MR#:  [id number]  DATE OF BIRTH:  1927/02/21  SUBJECTIVE:   PO intake a bit poor.  Urine output is poor/?? Urinary retention.  Afebrile, shortness of breath improved.    REVIEW OF SYSTEMS:    Review of Systems  Constitutional: Negative for fever and chills.  HENT: Negative for congestion and tinnitus.   Eyes: Negative for blurred vision and double vision.  Respiratory: Positive for shortness of breath (improved). Negative for cough and wheezing.   Cardiovascular: Negative for chest pain, orthopnea and PND.  Gastrointestinal: Negative for nausea, vomiting, abdominal pain and diarrhea.  Genitourinary: Negative for dysuria and hematuria.       Urinary retention  Neurological: Positive for weakness (generalized). Negative for dizziness, sensory change and focal weakness.  All other systems reviewed and are negative.   Nutrition: Dysphagia 3 Tolerating Diet: Yes Tolerating PT: Evaluation noted   DRUG ALLERGIES:   Allergies  Allergen Reactions  . Dabigatran Etexilate Mesylate Shortness Of Breath  . Penicillins Anaphylaxis and Other (See Comments)    Has patient had a PCN reaction causing immediate rash, facial/tongue/throat swelling, SOB or lightheadedness with hypotension: Yes Has patient had a PCN reaction causing severe rash involving mucus membranes or skin necrosis: No Has patient had a PCN reaction that required hospitalization No Has patient had a PCN reaction occurring within the last 10 years: Yes If all of the above answers are "NO", then may proceed with Cephalosporin use.  Marland Kitchen Coumadin [Warfarin Sodium] Other (See Comments)    Reaction:  Hematoma   . Doxycycline Hyclate [Doxycycline] Itching  . Sulfonamide Derivatives Other (See Comments)    Reaction:  Unknown   . Tape Other (See Comments)    Reaction:  Blisters   . Tramadol Hcl Itching    VITALS:  Blood pressure  114/70, pulse 109, temperature 98.4 F (36.9 C), temperature source Oral, resp. rate 16, height 5\' 5"  (1.651 m), weight 67.586 kg (149 lb), SpO2 93 %.  PHYSICAL EXAMINATION:   Physical Exam  GENERAL:  81 y.o.-year-old patient lying in the bed with no acute distress.  EYES: Pupils equal, round, reactive to light and accommodation. No scleral icterus. Extraocular muscles intact.  HEENT: Head atraumatic, normocephalic. Oropharynx and nasopharynx clear. Moist Oral Mucosa.  NECK:  Supple, no jugular venous distention. No thyroid enlargement, no tenderness.  LUNGS: Good A/E bilaterally, no rales, rhonchi. No use of accessory muscles of respiration.  CARDIOVASCULAR: S1, S2 normal. No murmurs, rubs, or gallops.  ABDOMEN: Soft, nontender, nondistended. Bowel sounds present. No organomegaly or mass.  EXTREMITIES: No cyanosis, clubbing, +1-2 edema b/l (dependent edema).   Bilateral lower extremities wrapped in Kerlix due to lower extremity ulcers. NEUROLOGIC: Cranial nerves II through XII are intact. No focal Motor or sensory deficits b/l.  Globally weak PSYCHIATRIC: The patient is alert and oriented x 3. Good affect SKIN: No obvious rash, lesion, + LE ulcers and + Stage 1 sacral ulcer.    LABORATORY PANEL:   CBC  Recent Labs Lab 06/24/15 0759  WBC 13.2*  HGB 8.5*  HCT 26.2*  PLT 304   ------------------------------------------------------------------------------------------------------------------  Chemistries   Recent Labs Lab 06/18/15 1249  06/24/15 0759  NA 129*  < > 137  K 5.1  < > 4.4  CL 95*  < > 107  CO2 25  < > 23  GLUCOSE 98  < > 100*  BUN 71*  < >  60*  CREATININE 2.29*  < > 2.44*  CALCIUM 8.1*  < > 8.6*  AST 49*  --   --   ALT 29  --   --   ALKPHOS 136*  --   --   BILITOT 0.5  --   --   < > = values in this interval not displayed. ------------------------------------------------------------------------------------------------------------------  Cardiac  Enzymes No results for input(s): TROPONINI in the last 168 hours. ------------------------------------------------------------------------------------------------------------------  RADIOLOGY:  No results found.   ASSESSMENT AND PLAN:   81 year old female with past medical history of rheumatoid arthritis, hypothyroidism, chronic fibrillation, hyperlipidemia, chronic pain, who presented to the hospital due to altered mental status and also noted to be in acute renal failure with bilateral weeping lower extremity ulcers.  #1 acute respiratory failure with hypoxia-this was likely secondary to pneumonia/CHF. - improving and continue to wean O2 as tolerated. - switched from Vanco/Levaquin to Oral Doxycycline for pneumonia as per ID.  - Patient is on dysphagia 3 diet now and tolerating it but poor PO intake.  - hold Lasix given ARF and poor PO intake.   #2 hx of chronic systolic CHF - clinically not in CHF.   - hold Lasix due to ARF and poor PO intake.   - Echo showing EF of 35-40%  #3 pneumonia-likely aspiration pneumonia. - Switched from IV vancomycin, Levaquin to oral doxycycline. -Blood cultures negative so far. Cont. Dysphagia 3 diet.   #4 acute renal failure-this was likely secondary to dehydration poor PO intake.   - will start on some gentle IV fluids and follow BUN/Cr.   - encourage PO intake.     #5 bilateral lower extremity ulcers/cellulitis- appreciate ID input and switched from IV Vanco, Levaquin to Oral Cipro, Doxy for 4 weeks w/ local wound care.  - cont. Flagyl for 10 day course until 12/23.     #6 hyponatremia-also secondary to poor by mouth intake and dehydration. Improved with IV fluid hydration and will monitor.   #7 history of rheumatoid arthritis-continue Plaquenil, methotrexate.  #8 hypothyroidism-continue Synthroid.  #9 depression-continue Cymbalta.  #10 GERD-continue Protonix.  #11 urinary retention-?? Urinary retention vs poor Urine output due to Poor Po  intake.  Will cont. IV fluids, In and out cath PRN and cont. Flomax.  If continues to have urinary retention then will place foley.    Await Palliative Care input.  Possible d/c to SNF in next 1-2 days.   All the records are reviewed and case discussed with Care Management/Social Workerr. Management plans discussed with the patient, family and they are in agreement.  CODE STATUS: DO NOT RESUSCITATE  DVT Prophylaxis: Eliquis  TOTAL TIME TAKING CARE OF THIS PATIENT: 30 minutes.   POSSIBLE D/C IN 1-2 DAYS, DEPENDING ON CLINICAL CONDITION.   Henreitta Leber M.D on 06/24/2015 at 2:09 PM  Between 7am to 6pm - Pager - [phone]  After 6pm go to [email or web address] - password [email or web address] [facility]  Office  [phone]  CC: Primary care physician; Viviana Simpler, MD

## 2015-06-24 NOTE — Progress Notes (Signed)
NS bolus 500 ml

## 2015-06-24 NOTE — Progress Notes (Signed)
Soap suds enema given with no results.

## 2015-06-24 NOTE — Progress Notes (Addendum)
Dr Hamilton Capri Ensure BID

## 2015-06-24 NOTE — Progress Notes (Signed)
Pt is a 81 year old female prescribed cipro, doxy, and flagyl for LE wound and PNA. Due to pt renal function, ciprofloxacin requires dose adjustment. Ciprofloxacin will be transitioned to cipro 500mg  daily. Will schedule both cipro and doxy to be separated from iron by 2 hours before or 4 hours after iron.  Ramond Dial, Pharm.D Clinical Pharmacist

## 2015-06-24 NOTE — Consult Note (Addendum)
Palliative Medicine Inpatient Consult Note   Name: [name] Date: 06/24/2015 MRN: [id number]  DOB: [birth year]  Referring Physician: Henreitta Leber, MD  Palliative Care consult requested for this 81 y.o. female for goals of medical therapy in patient with infected legs ulcers.   TODAY'S DISCUSSIONS AND DECISIONS:  I examined pt and sat down and spoke with both pt and her very pleasant husband in the patient's room.  I went over her problems including her lack of appetite and her inability to take in enough fluids to stay out of renal failure --or even to stay alive.  I mentioned hospice and discussed comfort care.  I asked her what she hopes will happen, etc.    The patient is NOT ready for Hospice. She desperately wants to get better and wants to go back to get rehab so she can go home to her husband.  At least for 'some more time'.    But, she is aware she is eating or drinking almost nothing.  She tells me things she has not told the nurses or doctors and they are as follows:  1 ---She is nauseated constantly since she got here.  She gets sick just looking at the food on the tray. 2 ---Pills come back up in her throat 3 ---She has a metallic taste in her mouth 4 ---She wants me to cut her medicines way back --at least for now 5 ---She is wanting to be off of methotrexate for a short time if I agree (I am not sure) 6 ---She hasn't had a BM for FIVE days.  Last BM documented was 5 days ago.  She feels VERY constipated. 7 ---She thinks she can drink something like Ensure better than she can eat actual food right now --until this chronic loss of appetite and nausea abates.   I reviewed meds and note that she is on a number which can be DCD. Doxy does affect appetite and nausea is a big side effect.  This might be something worth talking with ID about (but I have not changed this med order).  I have called and spoken with Dr. Verdell Carmine and he approves of me trimming her meds back  some.  She might need a drug holiday from methotrexate (and hence folate).  She is told that vitamins are nice, but she needs to get her nutrition from food first and foremost.  She wants liquid food like Ensure if possible for a few days.    I see this as a transition phase for patient. She is not ready for Hospice. BUT she is ready to have less aggressive med orders and more comfort focus.  She just had not been telling staff and says she is happy she has now told it to me.    I don't favor adding Megace.  I favor subtracting meds and treating constipation first.  Also adding a supplement with each tray so that if she only consumes that, she is still doing well.  She likes ginger ale and that is now being provided. She wasn't touching the thick liquids ordered at adm due to altered mental status. That has resolved and now she is on thin liquids.      IMPRESSION: 1.  Aspiration Pneumonia 2.  Infected  Ulcers-cellulitis and abscess with MRSA ---s/p post vascular intervention one month ago  ---sent to [facility] for rehab (ALF) ---left thigh wound  (stage 2) present on admission ---sacral wound (stage 2) present on admission ---pain  with dressing changes so orders changed to twice a week (down from 3x/ wk) 3.  Acute Renal Failure ---Cr was 1.67 on 12/17 and it has gone up daily and is now 2.44 4.  Chronic A fib 5.  Malnutrition --moderately severe and present on admission 6.  Anorexia of advanced age ---also pt does not like thick liquids --now changed to dysphagia 3 (chopped meats and thins) ---now will drink milk and gingerail 7.  Cardiomyopathy with EF 35-40% 8.  Metabolic Encephalopathy at admission --now resolved 9.  Dehydration due to poor liquid intake 10. Thrombocytosis (plts 304) 11.  PVD  ---with [employer] that had gotten dry --now [facility] 12.  Iron Deficiency anemia 13.  Depression 14.  Arthritis --must be RA since pt is on PLAQUENIL adn Methotrexate 15.  H/O gastritis and  duodenitis 16.  Hypothyroidism 17.  H/O diverticulosis 18.  A Fib --persistent (Xarelto --now changed to Eliquis) 19.  Dyslipidemia 20.  Polypharmacy  (she has a number of medical conditions being treated by these ---and she is now asking if her meds can be decreased as much as possible0 21.  Former Smoker  REVIEW OF SYSTEMS:  Patient is not able to provide ROS in detail due to weakness.  Nursing reported very poor urine output which has resulted in pt getting IV fluids today. No foley. I and O caths being done occas along with bladder scans.  SPIRITUAL SUPPORT SYSTEM: Yes.  SOCIAL HISTORY:  reports that she has quit smoking. Her smoking use included Cigarettes. She has never used smokeless tobacco. She reports that she drinks about 0.6 oz of alcohol per week. She reports that she does not use illicit drugs.  She is currently getting rehab at [facility] following bilateral leg vascular grafts per Dr. Lucky Cowboy.  Pt's husband is here and he says they both live in [place] and that she will return to their home once rehab is over.    LEGAL DOCUMENTS:  DNR form is already in the paper chart  CODE STATUS: DNR  PAST MEDICAL HISTORY: Past Medical History  Diagnosis Date  . Arthritis   . Hyperlipidemia   . Hypothyroidism   . Atrial fibrillation ([place])     a. s/p prior DCCV's, now persistent-->Xarelto (CHA2DS2VASc = 3).  . Diverticulosis     a. by colonoscopy, h/o colon adenoma ([place])  . Gastritis and duodenitis     a. 01/2014 EGD: Moderate non-erosive gastritis and mild non-erosive duodenitis.    PAST SURGICAL HISTORY:  Past Surgical History  Procedure Laterality Date  . Rotator cuff repair    . Intraocular lens implant, secondary  1996/ 2010  . Knee surgery      lateral  . Tonsillectomy    . Colonoscopy  02/2011    mod diverticulosis ([place])  . Total knee arthroplasty Bilateral   . I&d extremity Bilateral 05/12/2015    Procedure: IRRIGATION AND DEBRIDEMENT EXTREMITY;  Surgeon:  Algernon Huxley, MD;  Location: [facility] ORS;  Service: Vascular;  Laterality: Bilateral;  . Peripheral vascular catheterization  05/09/2015    Procedure: Lower Extremity Intervention;  Surgeon: Algernon Huxley, MD;  Location: [place] CV LAB;  Service: Cardiovascular;;  . Peripheral vascular catheterization N/A 05/09/2015    Procedure: Abdominal Aortogram w/Lower Extremity;  Surgeon: Algernon Huxley, MD;  Location: [place] [facility];  Service: Cardiovascular;  Laterality: N/A;    ALLERGIES:  is allergic to dabigatran etexilate mesylate; penicillins; coumadin; doxycycline hyclate; sulfonamide derivatives; tape; and tramadol hcl.  MEDICATIONS:  Current Facility-Administered Medications  Medication Dose Route Frequency Provider Last Rate Last Dose  . 0.9 %  sodium chloride infusion   Intravenous Continuous Henreitta Leber, MD 75 mL/hr at 06/24/15 1432    . acetaminophen (TYLENOL) tablet 650 mg  650 mg Oral Q6H PRN Theodoro Grist, MD       Or  . acetaminophen (TYLENOL) suppository 650 mg  650 mg Rectal Q6H PRN Theodoro Grist, MD      . antiseptic oral rinse (CPC / CETYLPYRIDINIUM CHLORIDE 0.05%) solution 7 mL  7 mL Mouth Rinse BID Loletha Grayer, MD   7 mL at 06/24/15 1005  . apixaban (ELIQUIS) tablet 2.5 mg  2.5 mg Oral BID Theodoro Grist, MD   2.5 mg at 06/24/15 1005  . bismuth subsalicylate (PEPTO BISMOL) 262 MG/15ML suspension 30 mL  30 mL Oral Q4H PRN Theodoro Grist, MD      . ciprofloxacin (CIPRO) tablet 500 mg  500 mg Oral Daily Adrian Prows, MD   500 mg at 06/24/15 1431  . docusate sodium (COLACE) capsule 100 mg  100 mg Oral BID Theodoro Grist, MD   100 mg at 06/24/15 1005  . doxycycline (VIBRA-TABS) tablet 100 mg  100 mg Oral Q12H Adrian Prows, MD   100 mg at 06/24/15 1431  . DULoxetine (CYMBALTA) DR capsule 30 mg  30 mg Oral BID Theodoro Grist, MD   30 mg at 06/24/15 1005  . ferrous sulfate tablet 325 mg  325 mg Oral Daily Theodoro Grist, MD   325 mg at 06/24/15 1005  . folic acid (FOLVITE)  tablet 2 mg  2 mg Oral Daily Theodoro Grist, MD   2 mg at 06/24/15 1004  . hydroxychloroquine (PLAQUENIL) tablet 400 mg  400 mg Oral QHS Theodoro Grist, MD   400 mg at 06/23/15 2105  . levothyroxine (SYNTHROID, LEVOTHROID) tablet 75 mcg  75 mcg Oral QAC breakfast Theodoro Grist, MD   75 mcg at 06/24/15 1005  . magnesium hydroxide (MILK OF MAGNESIA) suspension 15 mL  15 mL Oral Daily PRN Theodoro Grist, MD      . methotrexate (RHEUMATREX) tablet 12.5 mg  12.5 mg Oral Weekly Theodoro Grist, MD   12.5 mg at 06/19/15 0837  . metroNIDAZOLE (FLAGYL) tablet 500 mg  500 mg Oral 3 times per day Adrian Prows, MD   500 mg at 06/24/15 1431  . ondansetron (ZOFRAN) tablet 4 mg  4 mg Oral Q4H PRN Theodoro Grist, MD   4 mg at 06/22/15 1511  . oxyCODONE (Oxy IR/ROXICODONE) immediate release tablet 15 mg  15 mg Oral QID PRN Theodoro Grist, MD   15 mg at 06/23/15 0921  . oxyCODONE (OXYCONTIN) 12 hr tablet 20 mg  20 mg Oral BID Theodoro Grist, MD   20 mg at 06/24/15 1005  . pantoprazole (PROTONIX) EC tablet 40 mg  40 mg Oral QAC breakfast Theodoro Grist, MD   40 mg at 06/24/15 1004  . polyethylene glycol (MIRALAX / GLYCOLAX) packet 17 g  17 g Oral Daily PRN Theodoro Grist, MD      . senna (SENOKOT) tablet 8.6 mg  1 tablet Oral BID Theodoro Grist, MD   8.6 mg at 06/24/15 1005  . tamsulosin (FLOMAX) capsule 0.4 mg  0.4 mg Oral Daily Henreitta Leber, MD   0.4 mg at 06/24/15 1005  . vitamin B-12 (CYANOCOBALAMIN) tablet 2,000 mcg  2,000 mcg Oral Daily Theodoro Grist, MD   2,000 mcg at 06/24/15 1004    Vital  Signs: BP 111/62 mmHg  Pulse 102  Temp(Src) 98.6 F (37 C) (Oral)  Resp 18  Ht 5\' 5"  (1.651 m)  Wt 67.586 kg (149 lb)  BMI 24.79 kg/m2  SpO2 92% Filed Weights   06/22/15 0500 06/23/15 0500 06/24/15 0500  Weight: 60.056 kg (132 lb 6.4 oz) 60.056 kg (132 lb 6.4 oz) 67.586 kg (149 lb)    Estimated body mass index is 24.79 kg/(m^2) as calculated from the following:   Height as of this encounter: 5\' 5"  (1.651 m).    Weight as of this encounter: 67.586 kg (149 lb).  PERFORMANCE STATUS (ECOG) : 4 - Bedbound  PHYSICAL EXAM: Alert, tiny woman with RA hand changes sitting up in medical bed Fully oriented EOMI OP dry Neck no JVD or TM Heart irreg beats Abd soft and NT Ext she has bilateral gauze dressings on both lower legs  LABS: CBC:    Component Value Date/Time   WBC 13.2* 06/24/2015 0759   WBC 5.8 03/07/2014 1430   HGB 8.5* 06/24/2015 0759   HGB 9.5* 12/26/2014 1439   HCT 26.2* 06/24/2015 0759   PLT 304 06/24/2015 0759   MCV 81.6 06/24/2015 0759   NEUTROABS 15.6* 05/08/2015 1437   NEUTROABS 3.7 03/07/2014 1430   LYMPHSABS 0.7* 05/08/2015 1437   LYMPHSABS 1.4 03/07/2014 1430   MONOABS 0.0* 05/08/2015 1437   EOSABS 0.0 05/08/2015 1437   EOSABS 0.0 03/07/2014 1430   BASOSABS 0.0 05/08/2015 1437   BASOSABS 0.0 03/07/2014 1430   Comprehensive Metabolic Panel:    Component Value Date/Time   NA 137 06/24/2015 0759   NA 140 03/07/2014 1430   K 4.4 06/24/2015 0759   CL 107 06/24/2015 0759   CO2 23 06/24/2015 0759   BUN 60* 06/24/2015 0759   BUN 27 03/07/2014 1430   CREATININE 2.44* 06/24/2015 0759   GLUCOSE 100* 06/24/2015 0759   GLUCOSE 95 03/07/2014 1430   CALCIUM 8.6* 06/24/2015 0759   AST 49* 06/18/2015 1249   ALT 29 06/18/2015 1249   ALKPHOS 136* 06/18/2015 1249   BILITOT 0.5 06/18/2015 1249   PROT 5.8* 06/18/2015 1249   ALBUMIN 2.2* 06/18/2015 1249    TESTS:  Echo 06/20/15: - Left ventricle: The cavity size was normal. Wall thickness was normal. Systolic function was moderately reduced. The estimated ejection fraction was in the range of 35% to 40%. Diffuse hypokinesis. - Aortic valve: There was mild stenosis. Valve area (Vmax): 1.17 cm^2. - Mitral valve: There was moderate regurgitation. - Left atrium: The atrium was severely dilated. - Right ventricle: The cavity size was normal. Wall thickness was increased. - Right atrium: The atrium was severely  dilated. - Tricuspid valve: There was moderate-severe regurgitation. - Pulmonary arteries: Systolic pressure was moderately increased. PA peak pressure: 55 mm Hg (S). - Inferior vena cava: The vessel was severely dilated. Respirophasic changes in dimension were absent, severely elevated central venous pressure. - Pericardium, extracardiac: There was a left pleural effusion.   More than 50% of the visit was spent in counseling/coordination of care: Yes  Time Spent: 80 minutes  Addendum:  I have DCd the following  --Methotrexate--- just off her list as a drug holiday --till she is back to eating or dinking more per pt request) --Folate --B12 --Iron  --Flomax  Have ordered a different bowel regimen.  Given how much oxycodone she gets, she may be enema dependent now (prn enemas ordered).  Hesitate to order more pills and more liquid bowel regimen since she has trouble  taking all this in anyway.  Dulcolax suppositories might work well with her.    I have ordered Ensure WITH her meals --so she knows if she cannot eat her food due to nausea, that she must drink her meal (for now).

## 2015-06-24 NOTE — Plan of Care (Signed)
Problem: Nutrition: Goal: Adequate nutrition will be maintained Outcome: Progressing Patient alert. Scheduled pain medication given. Bladder scanned 241ml reported to MD bladder scan results and that patient did not void last night. IV fluids started. Patient's appetite a little improved. Encouraged to drink plenty fluids.
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
## 2015-06-24 NOTE — Progress Notes (Signed)
Dr Melodie Bouillon notified verbally that patient has not voided, bladders scan results 263. Acknowledged. No orders given

## 2015-06-25 ENCOUNTER — Telehealth: Payer: Self-pay | Admitting: Internal Medicine

## 2015-06-25 LAB — BASIC METABOLIC PANEL
ANION GAP: 7 (ref 5–15)
BUN: 65 mg/dL — ABNORMAL HIGH (ref 6–20)
CALCIUM: 8.4 mg/dL — AB (ref 8.9–10.3)
CO2: 22 mmol/L (ref 22–32)
Chloride: 107 mmol/L (ref 101–111)
Creatinine, Ser: 2.79 mg/dL — ABNORMAL HIGH (ref 0.44–1.00)
GFR calc Af Amer: 17 mL/min — ABNORMAL LOW (ref >60)
GFR, EST NON AFRICAN AMERICAN: 14 mL/min — AB (ref >60)
GLUCOSE: 121 mg/dL — AB (ref 65–99)
Potassium: 4.2 mmol/L (ref 3.5–5.1)
SODIUM: 136 mmol/L (ref 135–145)

## 2015-06-25 LAB — CBC
HCT: 27.1 % — ABNORMAL LOW (ref 35.0–47.0)
Hemoglobin: 8.5 g/dL — ABNORMAL LOW (ref 12.0–16.0)
MCH: 26.1 pg (ref 26.0–34.0)
MCHC: 31.4 g/dL — ABNORMAL LOW (ref 32.0–36.0)
MCV: 83 fL (ref 80.0–100.0)
PLATELETS: 304 10*3/uL (ref 150–440)
RBC: 3.26 MIL/uL — ABNORMAL LOW (ref 3.80–5.20)
RDW: 20.7 % — AB (ref 11.5–14.5)
WBC: 9.4 10*3/uL (ref 3.6–11.0)

## 2015-06-25 MED ORDER — PROMETHAZINE HCL 25 MG/ML IJ SOLN
12.5000 mg | Freq: Four times a day (QID) | INTRAMUSCULAR | Status: DC | PRN
  Administered 2015-06-25: 14:00:00 12.5 mg via INTRAVENOUS
  Filled 2015-06-25: qty 1

## 2015-06-25 MED ORDER — BISACODYL 10 MG RE SUPP
10.0000 mg | Freq: Every day | RECTAL | Status: DC
  Administered 2015-06-26 – 2015-06-27 (×2): 10 mg via RECTAL
  Filled 2015-06-25 (×2): qty 1

## 2015-06-25 NOTE — Care Management Important Message (Signed)
Important Message  Patient Details  Name: [name] MRN: [id number] Date of Birth: 1926/10/01   Medicare Important Message Given:  Yes    Shelbie Ammons, RN 06/25/2015, 11:34 AM

## 2015-06-25 NOTE — Clinical Social Work Note (Signed)
CSW is following for discharge planning needs. Pt will return to [facility] for STR when medically stable. Facility is aware and able to accept pt at discharge. CSW will continue to follow for discharge planning needs.   Darden Dates, MSW, LCSW Clinical Social Worker  [phone]

## 2015-06-25 NOTE — Progress Notes (Signed)
Nutrition Follow-up   INTERVENTION:   Meals and Snacks: Cater to patient preferences, SLP following. RD ordered soft peaches and chicken noodle soup for lunch as pt reports willing to try. Medical Food Supplement Therapy: Agree with Ensure Enlive po TID, each supplement provides 350 kcal and 20 grams of protein. Will continue [employer] as ordered as pt reports liking ice cream and preferring liquids currently.  Coordination of Care: pt may require calorie count on follow as pt with poor po intake and on isolation.   NUTRITION DIAGNOSIS:   Inadequate oral intake related to acute illness as evidenced by meal completion < 25%, per patient/family report.  GOAL:   Patient will meet greater than or equal to 90% of their needs; ongoing  MONITOR:    (Energy Intake, Electrolyte and Renal Profile, Anthropometrics, Digestive system)  REASON FOR ASSESSMENT:   Malnutrition Screening Tool, Consult  (Diet Order)  ASSESSMENT:   Pt admitted with weakness and Bilateral lower extremity cellulitis and abscess with dehydration secondary to poor po intake. Pt on isolation for MRSA.  Palliative Care following; per MD note, pt with nausea, constipation and not wanting to eat, but willing to try supplement on meal trays.   Diet Order:  DIET DYS 3 Room service appropriate?: Yes with Assist; Fluid consistency:: Thin    Current Nutrition: Pt ate bites of eggs, 2 bites of yogurt and had drank 90% of Ensure on visit. Pt reports willing to drink Ensure and ice cream later.  Pt with minimal po intake since admission, RD noted pt ate 25% of dinner last night recorded.   Gastrointestinal Profile: Last BM: 06/19/2015   Scheduled Medications:  . antiseptic oral rinse  7 mL Mouth Rinse BID  . apixaban  2.5 mg Oral BID  . ciprofloxacin  500 mg Oral Daily  . doxycycline  100 mg Oral Q12H  . feeding supplement (ENSURE ENLIVE)  237 mL Oral TID WC  . hydroxychloroquine  400 mg Oral QHS  .  levothyroxine  75 mcg Oral QAC breakfast  . metroNIDAZOLE  500 mg Oral 3 times per day  . ondansetron (ZOFRAN) IV  4 mg Intravenous TID AC  . oxyCODONE  20 mg Oral BID  . pantoprazole  40 mg Oral QAC breakfast  . senna  1 tablet Oral BID    Continuous Medications:  . sodium chloride 75 mL/hr at 06/25/15 0357     Electrolyte/Renal Profile and Glucose Profile:   Recent Labs Lab 06/23/15 0539 06/24/15 0759 06/25/15 0542  NA 138 137 136  K 3.9 4.4 4.2  CL 108 107 107  CO2 23 23 22   BUN 56* 60* 65*  CREATININE 2.09* 2.44* 2.79*  CALCIUM 8.5* 8.6* 8.4*  GLUCOSE 97 100* 121*   Protein Profile:  Recent Labs Lab 06/18/15 1249  ALBUMIN 2.2*     Weight Trend since Admission: Filed Weights   06/23/15 0500 06/24/15 0500 06/25/15 0915  Weight: 132 lb 6.4 oz (60.056 kg) 149 lb (67.586 kg) 149 lb (67.586 kg)     Skin:   (Stage II buttocks pressure ulcer)   BMI:  Body mass index is 24.79 kg/(m^2).  Estimated Nutritional Needs:   Kcal:  BEE: 1057kcals, TEE: (IF 1.1-1.3)(AF 1.2) 1394-1647kcals  Protein:  72-90g protein (1.2-1.5g/kg)  Fluid:  1497-1759mL of fluid (25-36mL/kg)   EDUCATION NEEDS:   Education needs no appropriate at this time   [place], RD, LDN Pager [phone] Weekend/On-Call Pager [phone]

## 2015-06-25 NOTE — Care Management (Signed)
Discharged from [facility] to [facility] 05/13/15. Readmitted to this facility 06/18/15 with the diagnosis of cellulitis of lower legs. Poor po intake per progress notes. Speech therapy has evaluated [name] and she is now on a dysphagia 3 diet. Wound care for lower legs continues. IV antibiotics changed to Cipro po. BUN 65, Creatinine 2.79 this morning. Decreased output per nurse's notes. Will return to [facility] when stable per physician. Shelbie Ammons RN MSN CCM Care Management [phone]

## 2015-06-25 NOTE — Progress Notes (Addendum)
Patient is very concerned about eventual discharge and paying for hospital stay. Case management is already involved in patient's care, but I have spoken with Hassan Rowan (CCM) this evening and she will make sure to speak to patient in depth tomorrow (12/22) about patient's concerns. Patient is aware that case management will be seeing her tomorrow.   UPDATE 1738: CSW has been notified of situation as well. She will work with the case manager and speak to patient tomorrow about her case.

## 2015-06-25 NOTE — Progress Notes (Signed)
[facility] [place] NAME: [name]    MR#:  [id number]  DATE OF BIRTH:  04-Nov-1926  SUBJECTIVE:   Renal function a bit worse today.  PO intake continues to be poor. Taking some Ensure.  A bit tearful today.     REVIEW OF SYSTEMS:    Review of Systems  Constitutional: Negative for fever and chills.  HENT: Negative for congestion and tinnitus.   Eyes: Negative for blurred vision and double vision.  Respiratory: Positive for shortness of breath (improved). Negative for cough and wheezing.   Cardiovascular: Negative for chest pain, orthopnea and PND.  Gastrointestinal: Negative for nausea, vomiting, abdominal pain and diarrhea.  Genitourinary: Negative for dysuria and hematuria.       Urinary retention  Neurological: Positive for weakness (generalized). Negative for dizziness, sensory change and focal weakness.  Psychiatric/Behavioral: Positive for depression.  All other systems reviewed and are negative.   Nutrition: Dysphagia 3 Tolerating Diet: Yes but poor.  Tolerating PT: Evaluation noted   DRUG ALLERGIES:   Allergies  Allergen Reactions  . Dabigatran Etexilate Mesylate Shortness Of Breath  . Penicillins Anaphylaxis and Other (See Comments)    Has patient had a PCN reaction causing immediate rash, facial/tongue/throat swelling, SOB or lightheadedness with hypotension: Yes Has patient had a PCN reaction causing severe rash involving mucus membranes or skin necrosis: No Has patient had a PCN reaction that required hospitalization No Has patient had a PCN reaction occurring within the last 10 years: Yes If all of the above answers are "NO", then may proceed with Cephalosporin use.  Marland Kitchen Coumadin [Warfarin Sodium] Other (See Comments)    Reaction:  Hematoma   . Doxycycline Hyclate [Doxycycline] Itching  . Sulfonamide Derivatives Other (See Comments)    Reaction:  Unknown   . Tape Other (See Comments)    Reaction:   Blisters   . Tramadol Hcl Itching    VITALS:  Blood pressure 117/79, pulse 120, temperature 97.6 F (36.4 C), temperature source Oral, resp. rate 18, height 5\' 5"  (1.651 m), weight 67.586 kg (149 lb), SpO2 93 %.  PHYSICAL EXAMINATION:   Physical Exam  GENERAL:  81 y.o.-year-old cahchectic patient lying in the bed tearful and depressed  EYES: Pupils equal, round, reactive to light and accommodation. No scleral icterus. Extraocular muscles intact.  HEENT: Head atraumatic, normocephalic. Oropharynx and nasopharynx clear. Moist Oral Mucosa.  NECK:  Supple, no jugular venous distention. No thyroid enlargement, no tenderness.  LUNGS: Some end expiratory wheezing and rhonchi. Good air entry bilaterally. No use of accessory muscles of respiration.  CARDIOVASCULAR: S1, S2 normal. No murmurs, rubs, or gallops.  ABDOMEN: Soft, nontender, nondistended. Bowel sounds present. No organomegaly or mass.  EXTREMITIES: No cyanosis, clubbing, +1-2 edema b/l (dependent edema).   Bilateral lower extremities wrapped in Kerlix due to lower extremity ulcers. NEUROLOGIC: Cranial nerves II through XII are intact. No focal Motor or sensory deficits b/l.  Globally weak PSYCHIATRIC: The patient is alert and oriented x 3. Tearful and depressed  SKIN: No obvious rash, lesion, + LE ulcers and + Stage 1 sacral ulcer.    LABORATORY PANEL:   CBC  Recent Labs Lab 06/25/15 0542  WBC 9.4  HGB 8.5*  HCT 27.1*  PLT 304   ------------------------------------------------------------------------------------------------------------------  Chemistries   Recent Labs Lab 06/25/15 0542  NA 136  K 4.2  CL 107  CO2 22  GLUCOSE 121*  BUN 65*  CREATININE 2.79*  CALCIUM 8.4*   ------------------------------------------------------------------------------------------------------------------  Cardiac Enzymes No results for input(s): TROPONINI in the last 168  hours. ------------------------------------------------------------------------------------------------------------------  RADIOLOGY:  No results found.   ASSESSMENT AND PLAN:   81 year old female with past medical history of rheumatoid arthritis, hypothyroidism, chronic fibrillation, hyperlipidemia, chronic pain, who presented to the hospital due to altered mental status and also noted to be in acute renal failure with bilateral weeping lower extremity ulcers.  #1 acute respiratory failure with hypoxia-this was likely secondary to pneumonia/CHF. - improving and continue to wean O2 as tolerated. - cont. Oral Doxycycline for pneumonia as per ID.  - Patient is on dysphagia 3 diet now and tolerating it but has poor PO intake.  -  Cont. To hold Lasix given ARF and poor PO intake.   #2 hx of chronic systolic CHF - clinically not in CHF.   - hold Lasix due to ARF and poor PO intake.   - Echo showing EF of 35-40%  #3 pneumonia-likely aspiration pneumonia. - Switched from IV vancomycin, Levaquin to oral doxycycline 06/24/15. -Blood cultures negative so far. Cont. Dysphagia 3 diet.   #4 acute renal failure-this was likely secondary to dehydration poor PO intake.   - cont. gentle IV fluids and follow BUN/Cr.   - encourage PO intake.     #5 bilateral lower extremity ulcers/cellulitis- appreciate ID input and switched from IV Vanco, Levaquin to Oral Cipro, Doxy for 4 weeks w/ local wound care.  - cont. Flagyl for 10 day course until 12/23.     #6 hyponatremia- resolved w/ IV fluid hydration and will monitor.   #7 history of rheumatoid arthritis-continue Plaquenil, methotrexate.  #8 hypothyroidism-continue Synthroid.  #9 depression-continue Cymbalta.  #10 GERD-continue Protonix.  #11 urinary retention-?? Urinary retention vs poor Urine output due to Poor Po intake.  Will cont. IV fluids, In and out cath PRN and cont. Flomax.  If continues to have urinary retention then will place foley.     Appreciate Palliative care input and pt. Is DNR and not ready for Hospice presently.  Possible d/c to SNF in next 1-2 days depending on progress.   All the records are reviewed and case discussed with Care Management/Social Workerr. Management plans discussed with the patient, family and they are in agreement.  CODE STATUS: DO NOT RESUSCITATE  DVT Prophylaxis: Eliquis  TOTAL TIME TAKING CARE OF THIS PATIENT: 25 minutes.   POSSIBLE D/C IN 1-2 DAYS, DEPENDING ON CLINICAL CONDITION.   Henreitta Leber M.D on 06/25/2015 at 1:48 PM  Between 7am to 6pm - Pager - [phone]  After 6pm go to [email or web address] - password [email or web address] [facility]  Office  [phone]  CC: Primary care physician; Viviana Simpler, MD

## 2015-06-25 NOTE — Telephone Encounter (Signed)
ERROR

## 2015-06-25 NOTE — Progress Notes (Signed)
Palliative Medicine Inpatient Consult Follow Up Note   Name: [name] Date: 06/25/2015 MRN: [id number]  DOB: 1926-11-27  Referring Physician: Henreitta Leber, MD  Palliative Care consult requested for this 81 y.o. female for goals of medical therapy in patient with worsening acute renal failure and severe anorexia which is probably multifactorial in etiology.   ------------------------------------------------------------------  TODAY'S DECISIONS:  I did not waken pt but will come talk to her in the morning.  She is quite upset and tearful pre nurses about 'the high hospital bill', "what to do to get out of the hospital?", "what is really going on?" etc.    Pt seemed shocked yesterday when I mentioned possible hospice, but it is evident that she has not been thriving for a long time now.    She says she wants to get better. She seems to think that if she is in a rehab setting, she is 'getting better'. And if she is in the hospital, she is 'staying sick'.  She won't do well in rehab unless she eats better than she has been. Her creatinine went up further today --even with IV fluids going.  She reportedly drank a full container of ENSURE this am.  We don't know what she did at lunch as it was not documented. She has consumed half a container of Ensure this pm --but NOTHING ELSE on her tray has been touched except for a small amount of ginger ale.    Pt did have a MASSIVE BM today.  This after an enema and after a dulcolax suppository which I am ordering qhs routinely now (discussed with nursing).  This Might make her appetite improve somewhat.  I still think her meds are contributing to her poor appetite.  Note that I have (with pts permission) DCd Methotrexate, Folate, b12, and a number of other meds (yesterday). This is a stepwise approach to eventual comfort care --with pts input and approval.  She probably has 'burned out RA at this point and she doesn't think she needs the RA meds though  I have kept her plaquenil --for now.    Will talk with her again tomorrow.  Will ask what she wants [place] to do about her worsening kidneys if the creatinine does not improve tomorrow --since she is continuing to get NS at 75/ hr which should drop the creatinine.  She was on vancomycin, so we need to be somewhat concerned about effects of vanco on her kidneys.   She 'looks' like a Sargeant pt.  But, she sees herself as someone who wants to get better and go back home to her husband.  Will talk with her further tomorrow.   --------------------------------------------------------------------------------------------------------- IMPRESSION: 1. Aspiration Pneumonia 2. Infected Ulcers-cellulitis and abscess with MRSA ---s/p post vascular intervention one month ago  ---sent to [facility] for rehab (ALF) ---left thigh wound (stage 2) present on admission ---sacral wound (stage 2) present on admission ---pain with dressing changes so orders changed to twice a week (down from 3x/ wk) 3. Acute Renal Failure ---Cr was 1.67 on 12/17 and it has gone up daily and is now 2.44 4. Chronic A fib 5. Malnutrition --moderately severe and present on admission Alb 2.2 on 06/18/15.  6. Anorexia of advanced age ---also pt does not like thick liquids --now changed to dysphagia 3 (chopped meats and thins) ---now will drink milk and gingerail 7. Cardiomyopathy with EF 35-40% 8. Metabolic Encephalopathy at admission --now resolved 9. Dehydration due to poor liquid intake  10. Thrombocytosis (plts 304) 11. PVD  ---with [employer] that had gotten dry --now [facility] 12. Iron Deficiency anemia 13. Depression 14. Arthritis --must be RA since pt is on PLAQUENIL adn Methotrexate 15. H/O gastritis and duodenitis 16. Hypothyroidism 17. H/O diverticulosis 18. A Fib --persistent (Xarelto --now changed to Eliquis) 19. Dyslipidemia 20. Polypharmacy (she has a number of medical conditions being treated  by these ---and she is now asking if her meds can be decreased as much as possible 21. Former Smoker  --------------------------------------------------------------------------------------  CODE STATUS: DNR   PAST MEDICAL HISTORY: Past Medical History  Diagnosis Date  . Arthritis   . Hyperlipidemia   . Hypothyroidism   . Atrial fibrillation ([place])     a. s/p prior DCCV's, now persistent-->Xarelto (CHA2DS2VASc = 3).  . Diverticulosis     a. by colonoscopy, h/o colon adenoma ([place])  . Gastritis and duodenitis     a. 01/2014 EGD: Moderate non-erosive gastritis and mild non-erosive duodenitis.    PAST SURGICAL HISTORY:  Past Surgical History  Procedure Laterality Date  . Rotator cuff repair    . Intraocular lens implant, secondary  1996/ 2010  . Knee surgery      lateral  . Tonsillectomy    . Colonoscopy  02/2011    mod diverticulosis ([place])  . Total knee arthroplasty Bilateral   . I&d extremity Bilateral 05/12/2015    Procedure: IRRIGATION AND DEBRIDEMENT EXTREMITY;  Surgeon: Algernon Huxley, MD;  Location: [facility] ORS;  Service: Vascular;  Laterality: Bilateral;  . Peripheral vascular catheterization  05/09/2015    Procedure: Lower Extremity Intervention;  Surgeon: Algernon Huxley, MD;  Location: [place] [facility];  Service: Cardiovascular;;  . Peripheral vascular catheterization N/A 05/09/2015    Procedure: Abdominal Aortogram w/Lower Extremity;  Surgeon: Algernon Huxley, MD;  Location: [place] [facility];  Service: Cardiovascular;  Laterality: N/A;    Vital Signs: BP 117/79 mmHg  Pulse 120  Temp(Src) 97.6 F (36.4 C) (Oral)  Resp 18  Ht 5\' 5"  (1.651 m)  Wt 67.586 kg (149 lb)  BMI 24.79 kg/m2  SpO2 93% Filed Weights   06/23/15 0500 06/24/15 0500 06/25/15 0915  Weight: 60.056 kg (132 lb 6.4 oz) 67.586 kg (149 lb) 67.586 kg (149 lb)    Estimated body mass index is 24.79 kg/(m^2) as calculated from the following:   Height as of this encounter: 5\' 5"  (1.651 m).   Weight as  of this encounter: 67.586 kg (149 lb).  PHYSICAL EXAM: Sleeping I did not waken her as staff tells me pt has been very emotionally labile today and needs her sleep. She appears incredibly tiny and frail --cachectic actually Eyes closed Hrt irreg irreg Lungs cta ant Abd soft and NT Skin --legs are wrapped with gauze distally  LABS: CBC:    Component Value Date/Time   WBC 9.4 06/25/2015 0542   WBC 5.8 03/07/2014 1430   HGB 8.5* 06/25/2015 0542   HGB 9.5* 12/26/2014 1439   HCT 27.1* 06/25/2015 0542   PLT 304 06/25/2015 0542   MCV 83.0 06/25/2015 0542   NEUTROABS 15.6* 05/08/2015 1437   NEUTROABS 3.7 03/07/2014 1430   LYMPHSABS 0.7* 05/08/2015 1437   LYMPHSABS 1.4 03/07/2014 1430   MONOABS 0.0* 05/08/2015 1437   EOSABS 0.0 05/08/2015 1437   EOSABS 0.0 03/07/2014 1430   BASOSABS 0.0 05/08/2015 1437   BASOSABS 0.0 03/07/2014 1430   Comprehensive Metabolic Panel:    Component Value Date/Time   NA 136 06/25/2015 0542  NA 140 03/07/2014 1430   K 4.2 06/25/2015 0542   CL 107 06/25/2015 0542   CO2 22 06/25/2015 0542   BUN 65* 06/25/2015 0542   BUN 27 03/07/2014 1430   CREATININE 2.79* 06/25/2015 0542   GLUCOSE 121* 06/25/2015 0542   GLUCOSE 95 03/07/2014 1430   CALCIUM 8.4* 06/25/2015 0542   AST 49* 06/18/2015 1249   ALT 29 06/18/2015 1249   ALKPHOS 136* 06/18/2015 1249   BILITOT 0.5 06/18/2015 1249   PROT 5.8* 06/18/2015 1249   ALBUMIN 2.2* 06/18/2015 1249    Time Spent: 35 min

## 2015-06-25 NOTE — Plan of Care (Signed)
Problem: Safety: Goal: Ability to remain free from injury will improve Outcome: Progressing Pt high fall risk but not impulsive. Assisted to [facility] x 2 person in hopes that gravity would aid in urinary and bowel elimination. No success. Bladder scan revealed < 400 mls x 2. Pt did not complain of bladder discomfort. Bowel sounds to LLQ and RLQ but none detected to RUQ or LUQ. Family in earlier on the shift and they also provided encouragement to patient. IV fluids infusing at 75 ml/hr. Pt with dependent as well as generalized edema to upper and lower extremities. MD aware of anuria.   Problem: Nutrition: Goal: Adequate nutrition will be maintained Outcome: Progressing Pt on dysphagia 3 diet. PO intake encouraged throughout the night. Frequent oral care. Pt enjoys ginger ale, ice chips and magic cup.

## 2015-06-25 NOTE — Progress Notes (Signed)
Physical Therapy Treatment Patient Details Name: [name] MRN: [id number] DOB: 1927-02-22 Today's Date: 06/25/2015    History of Present Illness [name] is a 81 y.o. female with a known history of healing bilateral lower extremity wounds , this post vascular intervention really no significant peripheral vascular disease by Dr. dew approximately 1 month ago, after which patient was sent to [facility] [place] [facility] for rehabilitation and wound care, comes back to the hospital with altered mental status, increasing somnolence increasing pain in lower extremities as well as increasing purulence of her chronic wounds. Pt also found to have R LL pna.    PT Comments    Patient with very limited participation and overall activity tolerance; agreeable to bed-level exercise (in limited capacity at that) only.  Will continue to progress as able.  Follow Up Recommendations  SNF     Equipment Recommendations       Recommendations for Other Services       Precautions / Restrictions Precautions Precautions: Fall Precaution Comments: contact isolation Restrictions Weight Bearing Restrictions: No    Mobility  Bed Mobility               General bed mobility comments: patient declined this date  Transfers                 General transfer comment: patient declined this date  Ambulation/Gait             General Gait Details: patient declined this date   Stairs            Wheelchair Mobility    Modified Rankin (Stroke Patients Only)       Balance                                    Cognition Arousal/Alertness: Awake/alert Behavior During Therapy: WFL for tasks assessed/performed                        Exercises Other Exercises Other Exercises: Supine LE therex, 1x10, active vs. act assist ROM: ankle pumps, quad sets, SAQs, heel slides. Patient declined additional therex, activity due to fatigue; unable to redirect despite  encouragement.    General Comments        Pertinent Vitals/Pain Pain Assessment: 0-10 Pain Score: 6  Pain Location: Bilat LEs Pain Descriptors / Indicators: Grimacing;Aching Pain Intervention(s): Limited activity within patient's tolerance;Monitored during session;Repositioned;Patient requesting pain meds-RN notified;RN gave pain meds during session    Home Living                      Prior Function            PT Goals (current goals can now be found in the care plan section) Acute Rehab PT Goals Patient Stated Goal: To walk again. PT Goal Formulation: With patient Time For Goal Achievement: 07/05/15 Potential to Achieve Goals: Fair Progress towards PT goals: Progressing toward goals    Frequency  Min 2X/week    PT Plan Current plan remains appropriate    Co-evaluation             End of Session   Activity Tolerance: Patient limited by fatigue;Patient limited by pain Patient left: in bed;with call bell/phone within reach     Time: 1459-1513 PT Time Calculation (min) (ACUTE ONLY): 14 min  Charges:  $Therapeutic Exercise: 8-22 mins  G Codes:       Infant Doane H. Owens Shark, PT, DPT, NCS 06/25/2015, 5:25 PM [phone]

## 2015-06-25 NOTE — Progress Notes (Signed)
Speech Therapy Note: reviewed chart notes; consulted NSG who reported pt has been taking a little more po's but requires encouragement as she does not want to eat much; Dietician following. NSG denied any overt s/s of aspiration w/ po's including meds taken one at a time. No elevation in temp or wbc noted. No further ST services indicated at this time. ST will sign off w/ NSG to reconsult if any change/decline in status requiring consultation. NSG agreed.

## 2015-06-26 ENCOUNTER — Telehealth: Payer: Self-pay

## 2015-06-26 LAB — BASIC METABOLIC PANEL
Anion gap: 6 (ref 5–15)
BUN: 66 mg/dL — ABNORMAL HIGH (ref 6–20)
CHLORIDE: 105 mmol/L (ref 101–111)
CO2: 20 mmol/L — ABNORMAL LOW (ref 22–32)
Calcium: 8.2 mg/dL — ABNORMAL LOW (ref 8.9–10.3)
Creatinine, Ser: 3.02 mg/dL — ABNORMAL HIGH (ref 0.44–1.00)
GFR calc non Af Amer: 13 mL/min — ABNORMAL LOW (ref >60)
GFR, EST AFRICAN AMERICAN: 15 mL/min — AB (ref >60)
Glucose, Bld: 101 mg/dL — ABNORMAL HIGH (ref 65–99)
POTASSIUM: 4.3 mmol/L (ref 3.5–5.1)
SODIUM: 131 mmol/L — AB (ref 135–145)

## 2015-06-26 LAB — HEMOGLOBIN: HEMOGLOBIN: 8.4 g/dL — AB (ref 12.0–16.0)

## 2015-06-26 LAB — ALBUMIN: Albumin: 2.9 g/dL — ABNORMAL LOW (ref 3.5–5.0)

## 2015-06-26 MED ORDER — LORAZEPAM 0.5 MG PO TABS: 0.5000 mg | ORAL_TABLET | ORAL | Status: DC | PRN

## 2015-06-26 MED ORDER — SENNA 8.6 MG PO TABS: 1.0000 | ORAL_TABLET | Freq: Two times a day (BID) | ORAL | Status: AC

## 2015-06-26 MED ORDER — LORAZEPAM 0.5 MG PO TABS: 0.5000 mg | ORAL_TABLET | ORAL | Status: AC | PRN

## 2015-06-26 MED ORDER — PROCHLORPERAZINE 25 MG RE SUPP: 25.0000 mg | Freq: Three times a day (TID) | RECTAL | Status: AC | PRN

## 2015-06-26 MED ORDER — BISACODYL 10 MG RE SUPP: 10.0000 mg | Freq: Every day | RECTAL | Status: AC

## 2015-06-26 MED ORDER — MORPHINE SULFATE (CONCENTRATE) 10 MG/0.5ML PO SOLN
2.6000 mg | Freq: Three times a day (TID) | ORAL | Status: DC
  Administered 2015-06-26 – 2015-06-28 (×7): 2.6 mg via ORAL
  Filled 2015-06-26 (×7): qty 1

## 2015-06-26 MED ORDER — MORPHINE SULFATE (CONCENTRATE) 10 MG/0.5ML PO SOLN: 5.0000 mg | ORAL | Status: AC | PRN

## 2015-06-26 MED ORDER — MORPHINE SULFATE (CONCENTRATE) 10 MG/0.5ML PO SOLN: 5.0000 mg | ORAL | Status: DC | PRN

## 2015-06-26 MED ORDER — PROCHLORPERAZINE 25 MG RE SUPP
25.0000 mg | Freq: Three times a day (TID) | RECTAL | Status: DC | PRN
  Filled 2015-06-26: qty 1

## 2015-06-26 MED ORDER — GLYCOPYRROLATE 1 MG PO TABS: 1.0000 mg | ORAL_TABLET | Freq: Three times a day (TID) | ORAL | Status: AC | PRN

## 2015-06-26 MED ORDER — GLYCOPYRROLATE 1 MG PO TABS
1.0000 mg | ORAL_TABLET | Freq: Three times a day (TID) | ORAL | Status: DC | PRN
  Filled 2015-06-26: qty 1

## 2015-06-26 MED ORDER — ONDANSETRON HCL 4 MG/2ML IJ SOLN: 4.0000 mg | Freq: Four times a day (QID) | INTRAMUSCULAR | Status: DC | PRN

## 2015-06-26 MED ORDER — ACETAMINOPHEN 325 MG PO TABS: 650.0000 mg | ORAL_TABLET | ORAL | Status: AC | PRN

## 2015-06-26 MED ORDER — LORAZEPAM 2 MG/ML IJ SOLN
0.5000 mg | INTRAMUSCULAR | Status: DC | PRN
  Administered 2015-06-27: 11:00:00 0.5 mg via INTRAVENOUS
  Filled 2015-06-26: qty 1

## 2015-06-26 MED ORDER — ENSURE ENLIVE PO LIQD: 237.0000 mL | Freq: Three times a day (TID) | ORAL | Status: AC

## 2015-06-26 MED ORDER — LORAZEPAM 0.5 MG PO TABS
0.2500 mg | ORAL_TABLET | Freq: Two times a day (BID) | ORAL | Status: DC
  Administered 2015-06-26 – 2015-06-27 (×4): 0.25 mg via ORAL
  Filled 2015-06-26 (×4): qty 1

## 2015-06-26 NOTE — Plan of Care (Signed)
Problem: Nutrition: Goal: Adequate nutrition will be maintained Outcome: Not Progressing Patient is comfort care. Foley in place. Dressings to bilateral legs changed. No indications of pain.

## 2015-06-26 NOTE — Progress Notes (Signed)
Palliative Care Update  Pts husband has requested Hospice Home.  I have spoken with [name]) and also again with niece, [name].  All agree with Hospice Home.  There are no beds, but Hospice Liaison is notified to start the process.   I will make pt comfort care and reduce medications to comfort meds and do Discharge Med Rec (soon) to prepare for planned DC to [place] when a bed becomes available for pt.   Kirby Funk, MD

## 2015-06-26 NOTE — Telephone Encounter (Signed)
[name], medical POA left v/m; Dr Megan Salon at hospital is putting pt in the [facility]. Wanted Dr Silvio Pate to be aware.

## 2015-06-26 NOTE — Progress Notes (Signed)
[place] at [place] NAME: [name]    MR#:  [id number]  DATE OF BIRTH:  1926/08/02  SUBJECTIVE:   Renal function getting worse. PO intake continues to be poor.  Anxious/tearful today.  Palliative care addressing goals of care.    REVIEW OF SYSTEMS:    Review of Systems  Unable to perform ROS: mental acuity    Nutrition: Dysphagia 3 Tolerating Diet: Yes but poor.  Tolerating PT: Evaluation noted   DRUG ALLERGIES:   Allergies  Allergen Reactions  . Dabigatran Etexilate Mesylate Shortness Of Breath  . Penicillins Anaphylaxis and Other (See Comments)    Has patient had a PCN reaction causing immediate rash, facial/tongue/throat swelling, SOB or lightheadedness with hypotension: Yes Has patient had a PCN reaction causing severe rash involving mucus membranes or skin necrosis: No Has patient had a PCN reaction that required hospitalization No Has patient had a PCN reaction occurring within the last 10 years: Yes If all of the above answers are "NO", then may proceed with Cephalosporin use.  Marland Kitchen Coumadin [Warfarin Sodium] Other (See Comments)    Reaction:  Hematoma   . Doxycycline Hyclate [Doxycycline] Itching  . Sulfonamide Derivatives Other (See Comments)    Reaction:  Unknown   . Tape Other (See Comments)    Reaction:  Blisters   . Tramadol Hcl Itching    VITALS:  Blood pressure 106/69, pulse 70, temperature 97.9 F (36.6 C), temperature source Oral, resp. rate 20, height 5\' 5"  (1.651 m), weight 77.565 kg (171 lb), SpO2 91 %.  PHYSICAL EXAMINATION:   Physical Exam  GENERAL:  81 y.o.-year-old cahchectic patient lying in the bed tearful and depressed  EYES: Pupils equal, round, reactive to light and accommodation. No scleral icterus. Extraocular muscles intact.  HEENT: Head atraumatic, normocephalic. Oropharynx and nasopharynx clear. Dry Oral Mucosa.  NECK:  Supple, no jugular venous distention. No thyroid  enlargement, no tenderness.  LUNGS: Some end expiratory wheezing and rhonchi. Good air entry bilaterally. No use of accessory muscles of respiration.  CARDIOVASCULAR: S1, S2 normal. No murmurs, rubs, or gallops.  ABDOMEN: Soft, nontender, nondistended. Bowel sounds present. No organomegaly or mass.  EXTREMITIES: No cyanosis, clubbing, +1-2 edema b/l (dependent edema).   Bilateral lower extremities wrapped in Kerlix due to lower extremity ulcers. NEUROLOGIC: Cranial nerves II through XII are intact. No focal Motor or sensory deficits b/l.  Globally weak PSYCHIATRIC: The patient is alert and oriented x 3. Tearful and depressed  SKIN: No obvious rash, lesion, + LE ulcers and + Stage 1 sacral ulcer.    LABORATORY PANEL:   CBC  Recent Labs Lab 06/25/15 0542 06/26/15 0627  WBC 9.4  --   HGB 8.5* 8.4*  HCT 27.1*  --   PLT 304  --    ------------------------------------------------------------------------------------------------------------------  Chemistries   Recent Labs Lab 06/26/15 0627  NA 131*  K 4.3  CL 105  CO2 20*  GLUCOSE 101*  BUN 66*  CREATININE 3.02*  CALCIUM 8.2*   ------------------------------------------------------------------------------------------------------------------  Cardiac Enzymes No results for input(s): TROPONINI in the last 168 hours. ------------------------------------------------------------------------------------------------------------------  RADIOLOGY:  No results found.   ASSESSMENT AND PLAN:   81 year old female with past medical history of rheumatoid arthritis, hypothyroidism, chronic fibrillation, hyperlipidemia, chronic pain, who presented to the hospital due to altered mental status and also noted to be in acute renal failure with bilateral weeping lower extremity ulcers.  #1 acute respiratory failure with hypoxia-this was likely secondary to pneumonia/mild  CHF.  - improving and continue to wean O2 as tolerated. - cont. Oral  Doxycycline for pneumonia as per ID.  - Patient is on dysphagia 3 diet now and tolerating it but has poor PO intake.  -  Cont. To hold Lasix given ARF and poor PO intake.   #2 hx of chronic systolic CHF - clinically not in CHF.   - cont. To hold Lasix due to ARF and poor PO intake.   - Echo showing EF of 35-40%  #3 pneumonia-likely aspiration pneumonia. - Switched from IV vancomycin, Levaquin to oral doxycycline 06/24/15. -Blood cultures negative so far. Cont. Dysphagia 3 diet.   #4 acute renal failure-likely multifactorial. Combination of cardiorenal, poor by mouth intake, low oncotic pressure given severe malnutrition.  - cont. gentle IV fluids and follow BUN/creatinine. She was on albumin but that's not a permanent solution.  - Palliative care discussing goals of care w/ POA today and if they want to pursue aggressive care then will get Nephrology consult.      #5 bilateral lower extremity ulcers/cellulitis- appreciate ID input and switched from IV Vanco, Levaquin to Oral Cipro, Doxy for 4 weeks w/ local wound care.  - cont. Flagyl for 10 day course until 12/23.     #6 hyponatremia- cont. IV fluids and follow sodium.   #7 history of rheumatoid arthritis-continue Plaquenil, methotrexate.  #8 hypothyroidism-continue Synthroid.  #9 depression-continue Cymbalta.  #10 GERD-continue Protonix.  #11 urinary retention-s/p foley now.  Cont. Flomax and will monitor.   Appreciate palliative care input and he will have a discussion about goals of care with the POA today. Patient has multiorgan issues and the prognosis is very poor and she would benefit from hospice services which is to be discussed by palliative care with the POA.  All the records are reviewed and case discussed with Care Management/Social Workerr. Management plans discussed with the patient, family and they are in agreement.  CODE STATUS: DO NOT RESUSCITATE  DVT Prophylaxis: Eliquis  TOTAL TIME TAKING CARE OF THIS  PATIENT: 35  minutes.   POSSIBLE D/C IN 1-2 DAYS, DEPENDING ON CLINICAL CONDITION.  Greater than 50% of time spent in coordination of care in discussion with patient, nursing, Palliative Care physician.    Henreitta Leber M.D on 06/26/2015 at 2:04 PM  Between 7am to 6pm - Pager - [phone]  After 6pm go to [email or web address] - password [email or web address] [facility]  Office  [phone]  CC: Primary care physician; Viviana Simpler, MD

## 2015-06-26 NOTE — Progress Notes (Addendum)
Palliative Medicine Inpatient Consult Follow Up Note   Name: [name] Date: 06/26/2015 MRN: [id number]  DOB: Dec 23, 1926  Referring Physician: Henreitta Leber, MD  Palliative Care consult requested for this 81 y.o. female for goals of medical therapy in patient with acute renal failure and multiple organ sytem problems.  TODAY'S DISCUSSIONS AND DECISIONS: 1.  Pt is NOT able to make her own health care decisions.  We should still try to talk with her and she may be able to participate somewhat in communicating wishes or needs, but overall, she is far too confused now. This is probably due at least in part to uremia related to worsening renal failure.  2.  Pts niece, Vernell Morgans, who lives in [place], [place], and who describes herself as patient's 'only living competent relative', called while I was on the unit and I spoke with her.  I let the niece know everything I could about pts dire and grim condition and options for care --including [place] or Home with Hospice or Hospice in a long term care facility.  I spoke about what dialysis would be like and that pt would end up going to an LTAC (and I described that a bit).  Niece doesn't feel that dialysis would be a good option and that comfort care would probably be best.    3.  The pt's niece, Irine Seal [phone]) let me know toward the end of our conversation that pt ACTUALLY [place]!  It is a close friend, [name].  The niece is going to call Joycelyn Schmid and have her call up here.   4.  My hands are tied in terms of being able to further decision making until I can speak with the HCPOA.  I will update the husband when he comes in and let him know why I have to talk to [place].    5.  Pts leg wounds apparently look 'really bad'.  Will ask nursing to do another dressing change tomorrow and call me so I can see these.    6.  I have today spoken with Dr. Candiss Norse (nephrology), Dr  Verdell Carmine (attending), care mgmt, and nursing.    IMPRESSION: 1. Aspiration Pneumonia 2. Infected Ulcers-cellulitis and abscess with MRSA ---s/p post vascular intervention one month ago  ---sent to [facility] for rehab (ALF) ---left thigh wound (stage 2) present on admission ---sacral wound (stage 2) present on admission ---pain with dressing changes so orders changed to twice a week (down from 3x/ wk) 3. Acute Renal Failure ---Cr was 1.67 on 12/17 and it has gone up daily and is now OVER 3.0. 4. Chronic A fib 5. Malnutrition --moderately severe and present on admission Alb 2.2 on 06/18/15.  6. Anorexia of advanced age ---also pt does not like thick liquids --now changed to dysphagia 3 (chopped meats and thins) ---now will drink milk and gingerail 7. Cardiomyopathy with EF 35-40% 8. Metabolic Encephalopathy at admission --now resolved 9. Dehydration due to poor liquid intake 10. Thrombocytosis (plts 304) 11. PVD  ---with Louretta [employer] that had gotten dry --now [facility] 12. Iron Deficiency anemia 13. Depression 14. Arthritis --must be RA since pt is on PLAQUENIL adn Methotrexate 15. H/O gastritis and duodenitis 16. Hypothyroidism 17. H/O diverticulosis 18. A Fib --persistent (Xarelto --now changed to Eliquis) 19. Dyslipidemia 20. Polypharmacy (she has a number of medical conditions being treated by these ---and she is now asking if her meds can be  decreased as much as possible 21. Former Smoker   REVIEW OF SYSTEMS:  uNABLE TO PROVIDE   CODE STATUS: DNR   PAST MEDICAL HISTORY: Past Medical History  Diagnosis Date  . Arthritis   . Hyperlipidemia   . Hypothyroidism   . Atrial fibrillation ([place])     a. s/p prior DCCV's, now persistent-->Xarelto (CHA2DS2VASc = 3).  . Diverticulosis     a. by colonoscopy, h/o colon adenoma ([place])  . Gastritis and duodenitis     a. 01/2014 EGD: Moderate non-erosive gastritis and mild non-erosive duodenitis.    PAST  SURGICAL HISTORY:  Past Surgical History  Procedure Laterality Date  . Rotator cuff repair    . Intraocular lens implant, secondary  1996/ 2010  . Knee surgery      lateral  . Tonsillectomy    . Colonoscopy  02/2011    mod diverticulosis ([place])  . Total knee arthroplasty Bilateral   . I&d extremity Bilateral 05/12/2015    Procedure: IRRIGATION AND DEBRIDEMENT EXTREMITY;  Surgeon: Algernon Huxley, MD;  Location: [facility] ORS;  Service: Vascular;  Laterality: Bilateral;  . Peripheral vascular catheterization  05/09/2015    Procedure: Lower Extremity Intervention;  Surgeon: Algernon Huxley, MD;  Location: [place] CV LAB;  Service: Cardiovascular;;  . Peripheral vascular catheterization N/A 05/09/2015    Procedure: Abdominal Aortogram w/Lower Extremity;  Surgeon: Algernon Huxley, MD;  Location: [place] CV LAB;  Service: Cardiovascular;  Laterality: N/A;    Vital Signs: BP 106/62 mmHg  Pulse 80  Temp(Src) 98.1 F (36.7 C) (Oral)  Resp 17  Ht '5\' 5"'$  (1.651 m)  Wt 77.565 kg (171 lb)  BMI 28.46 kg/m2  SpO2 97% Filed Weights   06/25/15 0915 06/25/15 2052 06/26/15 0258  Weight: 67.586 kg (149 lb) 82.827 kg (182 lb 9.6 oz) 77.565 kg (171 lb)    Estimated body mass index is 28.46 kg/(m^2) as calculated from the following:   Height as of this encounter: '5\' 5"'$  (1.651 m).   Weight as of this encounter: 77.565 kg (171 lb).  PHYSICAL EXAM: Frail Awake but tremulous and anxious and confused Able to speak but says, "I don't know what to do.".  And "I don't know how I got like this."   She also does not understand much of what I say --even when stated simply.  She either does not retain what I said or it never registers.  She is not able to comprehend her current condition and the issues associated with decisions about aggressive care, possibly futile care, or hospice/ comfort care. No JVD or Tm Hrt rrr no m Lungs cta Abd soft and NT Ext I did not see her legs today during dressing change.   Nursing tells me her legs 'look terrible with raw open areas and necrotic areas also.     LABS: CBC:    Component Value Date/Time   WBC 9.4 06/25/2015 0542   WBC 5.8 03/07/2014 1430   HGB 8.4* 06/26/2015 0627   HGB 9.5* 12/26/2014 1439   HCT 27.1* 06/25/2015 0542   PLT 304 06/25/2015 0542   MCV 83.0 06/25/2015 0542   NEUTROABS 15.6* 05/08/2015 1437   NEUTROABS 3.7 03/07/2014 1430   LYMPHSABS 0.7* 05/08/2015 1437   LYMPHSABS 1.4 03/07/2014 1430   MONOABS 0.0* 05/08/2015 1437   EOSABS 0.0 05/08/2015 1437   EOSABS 0.0 03/07/2014 1430   BASOSABS 0.0 05/08/2015 1437   BASOSABS 0.0 03/07/2014 1430   Comprehensive Metabolic Panel:  Component Value Date/Time   NA 131* 06/26/2015 0627   NA 140 03/07/2014 1430   K 4.3 06/26/2015 0627   CL 105 06/26/2015 0627   CO2 20* 06/26/2015 0627   BUN 66* 06/26/2015 0627   BUN 27 03/07/2014 1430   CREATININE 3.02* 06/26/2015 0627   GLUCOSE 101* 06/26/2015 0627   GLUCOSE 95 03/07/2014 1430   CALCIUM 8.2* 06/26/2015 0627   AST 49* 06/18/2015 1249   ALT 29 06/18/2015 1249   ALKPHOS 136* 06/18/2015 1249   BILITOT 0.5 06/18/2015 1249   PROT 5.8* 06/18/2015 1249   ALBUMIN 2.9* 06/26/2015 7622    More than 50% of the visit was spent in counseling/coordination of care: YES  Time Spent:  65 min  Addendum:  Patient's HCPOA called here.  Her number is [phone].  She will come up here close to 5pm and meet with me. She understands that she is the decision maker for pt and she was made aware of the grave condition and serious decisions at hand.    Also, pt's husband (finally) arrived and I spoke with him and informed him about the pts grim outlook and the option of dialysis with LTAC or Hospice care.  He seemed to want to be aggressive.  But then, after we met, he told the nurse to tell me '[place]'.   But if pt has a legal HCPOA, then by law, I still need to get this directive from the [place] (which I am sure she would agree  with)   So far, I have spent at least 3 hours on this matter today due to the complex social issues and the multiple people I have been talking with multiple times.   Will follow up at 4-5 pm.   Kirby Funk MD

## 2015-06-26 NOTE — Telephone Encounter (Signed)
Okay I spoke to her today and she sounded bad and her kidneys are failing. This probably makes sense

## 2015-06-26 NOTE — Plan of Care (Signed)
Problem: Nutrition: Goal: Adequate nutrition will be maintained Outcome: Not Progressing Pt still with poor po intake. Ensure supplement provided. PO intake encouraged. Oral care given. Slow wound healing r/t poor nutrition. Wound care provided to BLE on Monday and Thursday. Speciality bed/mattress in place. UOP decreased. Bladder scan was > 400. 3+ pitting edema. IVF were at 43ml/hr with worsening Creatinine. Heart rate was > 120. Lungs diminished. Weak cough. MD notified. IVF discontinued. Indwelling foley catheter placed for retention per MD order. Tea colored urine with 350 mls post foley placement. Since foley placed UOP still low. Foley care provided. Pt niece [name] called and was given an update on patient condition. Will continue to monitor.

## 2015-06-26 NOTE — Progress Notes (Signed)
New referral for the hospice home on 81yo female post palliative medicine consult for EOL care and symptom management for pain and anxiety/agitation.  Patient was admitted to [facility] on 12.14.16 with Dx cellulitis, acute respiratory failure secondary to aspiration pneumonia.  Patient has been on IV vancomycin, levaquin with no improvement in would status.  Post palliative medicine consult and goals of care discussion with patient's husband- patient has multiorgan system issues with poor prognosis.  Husband [name] is in agreement with hospice home and states patient has been a long time volunteer for the organization.  I discussed hospice home services with him and consents signed.  He is aware that there is currently a wait list and that she will be moved to the hospice home if stable and when a bed is available.  Discussion had with medical team at [facility] and all in agreement.  Updated information faxed to referral intake.  Hospice will continue to follow through final disposition.

## 2015-06-26 NOTE — Clinical Documentation Improvement (Addendum)
Internal Medicine  Can the diagnosis of "wound" be further specified by type ? Thank you   pressure ulcer  non pressure ulcer   Diabetic ulcer   Venous stasis ulcer  Other  Clinically Undetermined   Supporting information:  ---left thigh wound (stage 2) present on admission ---sacral wound (stage 2) present on admission   Please exercise your independent, professional judgment when responding. A specific answer is not anticipated or expected.   Thank You,  [place] [phone]

## 2015-06-27 NOTE — Progress Notes (Signed)
   06/27/15 0930  Clinical Encounter Type  Visited With Patient  Visit Type Initial  Attempted to visit with patient, but pt was sleeping or non responsive.  Will try again later today if possible or will have on call chaplain follow up if necessary.  [place] [phone]

## 2015-06-27 NOTE — Care Management Important Message (Signed)
Important Message  Patient Details  Name: [name] MRN: [id number] Date of Birth: 1927/04/19   Medicare Important Message Given:  Yes    Shelbie Ammons, RN 06/27/2015, 10:04 AM

## 2015-06-27 NOTE — Progress Notes (Signed)
[name]    MR#:  [id number]  DATE OF BIRTH:  08-26-1926  SUBJECTIVE:   Resting comfortably and family at bedside.  Made COMFORT CARE ONLY yesterday.   REVIEW OF SYSTEMS:    Review of Systems  Unable to perform ROS: mental acuity    DRUG ALLERGIES:   Allergies  Allergen Reactions  . Dabigatran Etexilate Mesylate Shortness Of Breath  . Penicillins Anaphylaxis and Other (See Comments)    Has patient had a PCN reaction causing immediate rash, facial/tongue/throat swelling, SOB or lightheadedness with hypotension: Yes Has patient had a PCN reaction causing severe rash involving mucus membranes or skin necrosis: No Has patient had a PCN reaction that required hospitalization No Has patient had a PCN reaction occurring within the last 10 years: Yes If all of the above answers are "NO", then may proceed with Cephalosporin use.  Marland Kitchen Coumadin [Warfarin Sodium] Other (See Comments)    Reaction:  Hematoma   . Doxycycline Hyclate [Doxycycline] Itching  . Sulfonamide Derivatives Other (See Comments)    Reaction:  Unknown   . Tape Other (See Comments)    Reaction:  Blisters   . Tramadol Hcl Itching    VITALS:  Blood pressure 118/82, pulse 109, temperature 98 F (36.7 C), temperature source Oral, resp. rate 20, height 5\' 5"  (1.651 m), weight 77.565 kg (171 lb), SpO2 90 %.  PHYSICAL EXAMINATION:   Physical Exam  GENERAL:  81 y.o.-year-old cahchectic patient lying in the bed comfortable and lethargic.  HEENT: Head atraumatic, normocephalic. Dry Oral Mucosa.  NECK:  Supple, no jugular venous distention. No thyroid enlargement, no tenderness.  LUNGS: Good air entry b/l. No use of accessory muscles of respiration.  CARDIOVASCULAR: S1, S2 tachy. No murmurs, rubs, or gallops.  ABDOMEN: Soft, nontender, nondistended. Hypoactive Bowel sounds. No organomegaly or mass.  EXTREMITIES: No cyanosis, clubbing, +1-2  edema b/l (dependent edema).   Bilateral lower extremities wrapped in Kerlix due to lower extremity ulcers. NEUROLOGIC: Lethargic/sedated.  PSYCHIATRIC: lethargic/sedated.  SKIN: No obvious rash, lesion, + LE ulcers and + Stage 1 sacral ulcer.    LABORATORY PANEL:   CBC  Recent Labs Lab 06/25/15 0542 06/26/15 0627  WBC 9.4  --   HGB 8.5* 8.4*  HCT 27.1*  --   PLT 304  --    ------------------------------------------------------------------------------------------------------------------  Chemistries   Recent Labs Lab 06/26/15 0627  NA 131*  K 4.3  CL 105  CO2 20*  GLUCOSE 101*  BUN 66*  CREATININE 3.02*  CALCIUM 8.2*   ------------------------------------------------------------------------------------------------------------------  Cardiac Enzymes No results for input(s): TROPONINI in the last 168 hours. ------------------------------------------------------------------------------------------------------------------  RADIOLOGY:  No results found.   ASSESSMENT AND PLAN:   81 year old female with past medical history of rheumatoid arthritis, hypothyroidism, chronic fibrillation, hyperlipidemia, chronic pain, who presented to the hospital due to altered mental status and also noted to be in acute renal failure with bilateral weeping lower extremity ulcers.  #1 acute respiratory failure with hypoxia #2 hx of chronic systolic CHF #3 pneumonia-likely aspiration pneumonia. #4 acute renal failure #5 bilateral lower extremity ulcers/cellulitis #6 hyponatremia  #7 history of rheumatoid arthritis #8 hypothyroidism #9 depression #10 urinary retention  Appreciate palliative care input and they had a long discussion w/ pt's husband and also POA and pt. Was made COMFORT CARE ONLY yesterday.  - cont. Supportive care w/ Morphine, Ativan, Glycopyrrolate.    Possible d/c to Hospice home when bed available.  All the records are reviewed and case discussed with Care  Management/Social Workerr. Management plans discussed with the patient, family and they are in agreement.  CODE STATUS: DO NOT RESUSCITATE  DVT Prophylaxis: Eliquis  TOTAL TIME TAKING CARE OF THIS PATIENT: 25  minutes.   POSSIBLE D/C unclear.   Henreitta Leber M.D on 06/27/2015 at 1:58 PM  Between 7am to 6pm - Pager - [phone]  After 6pm go to [email or web address] - password [email or web address] [facility]  Office  [phone]  CC: Primary care physician; Viviana Simpler, MD

## 2015-06-27 NOTE — Progress Notes (Signed)
Check in on patient, Patient still resting and non responsive to my visit. [place] 1200

## 2015-06-27 NOTE — Progress Notes (Signed)
Nutrition Brief Note  Chart reviewed. Pt now transitioning to comfort care and planning discharge to hospice home per Palliative care note No further nutrition interventions warranted at this time.  Please re-consult as needed.   [name], [place], [place] (pager) Weekend/On-Call pager [phone])

## 2015-06-27 NOTE — Clinical Social Work Note (Signed)
Clinical Education officer, museum spoke with Asst Director of [employer]. Pt will not have a bed until likely tomorrow. Weekend CSW will follow up regarding discharge. CSW updated pt's husband. CSW provided support to him around his wife's end of life. Pt's husband requested that covering [place] call him with updated tomorrow Chinita [place], [phone]). CSW also paged MD to updated as well. CSW will continue to follow.   Darden Dates, MSW, LCSW Clinical Social Worker  [phone]

## 2015-06-27 NOTE — Plan of Care (Signed)
Problem: Nutrition: Goal: Adequate nutrition will be maintained Outcome: Progressing Pt has been resting comfortable during shift. Pt had a bowel movement during shift and has little output. No other signs of distress noted. Will continue to monitor.

## 2015-06-28 NOTE — Progress Notes (Signed)
Patient discharged to Hospice Home via EMS. Janelli Welling S, RN  

## 2015-06-28 NOTE — Clinical Social Work Note (Signed)
Husband of patient has completed hospice home admission paperwork and it has been faxed to Lauren's attention with hospice home. Lauren notified. Shela Leff MSW,LCSW [phone]

## 2015-06-28 NOTE — Discharge Summary (Signed)
[name] NAME: [name]    MR#:  [id number]  DATE OF BIRTH:  1927-05-22  DATE OF ADMISSION:  06/18/2015 ADMITTING PHYSICIAN: Theodoro Grist, MD  DATE OF DISCHARGE: 06/28/2015  PRIMARY CARE PHYSICIAN: Viviana Simpler, MD    ADMISSION DIAGNOSIS:  Hyponatremia [id number] Complicated wound infection, initial encounter ([address]) [T79.8XXA] Acute renal failure, unspecified acute renal failure type ([place]) [N17.9] Altered mental status, unspecified altered mental status type [R41.82]  DISCHARGE DIAGNOSIS:  Active Problems:   Cellulitis and abscess of leg   Pressure ulcer   Acute renal failure ([place])   SECONDARY DIAGNOSIS:   Past Medical History  Diagnosis Date  . Arthritis   . Hyperlipidemia   . Hypothyroidism   . Atrial fibrillation ([place])     a. s/p prior DCCV's, now persistent-->Xarelto (CHA2DS2VASc = 3).  . Diverticulosis     a. by colonoscopy, h/o colon adenoma ([place])  . Gastritis and duodenitis     a. 01/2014 EGD: Moderate non-erosive gastritis and mild non-erosive duodenitis.    HOSPITAL COURSE:   81 year old female with past medical history of rheumatoid arthritis, hypothyroidism, chronic fibrillation, hyperlipidemia, chronic pain, who presented to the hospital due to altered mental status and also noted to be in acute renal failure with bilateral weeping lower extremity ulcers.  #1 acute respiratory failure with hypoxia-this was likely secondary to pneumonia/mild CHF.  -Patient was started on broad-spectrum IV antibiotics and also given when necessary IV diuretics and has clinically improved since admission. -Since she has developed multiorgan failure and issues and she was made comfort care she has been taken off diuretics and also antibiotics presently.  #2 hx of chronic systolic CHF - patient has clinically improved and is currently not in CHF. - cont. To hold Lasix due to ARF and poor PO intake.  -  Echo showing EF of 35-40%  #3 pneumonia- this was aspiration pneumonia. - Initially patient was on IV vancomycin, Levaquin and eventually switched over to oral doxycycline but currently is off all antibiotics and she is comfort care only. She is on a dysphagia 3 diet and is tolerating it for pleasure needs.   #4 acute renal failure-likely multifactorial. Combination of cardiorenal, poor by mouth intake, low oncotic pressure given severe malnutrition.  - She was given IV fluids, albumin without much improvement in renal function. Since she was made comfort care only a nephrology consult was not obtained and she was managed supportively.   #5 bilateral lower extremity ulcers/cellulitis- patient was initially started on broad-spectrum IV antibiotics with vancomycin and Levaquin and Flagyl. A routine consult was obtained and local wound care was applied to her lower extremities bilaterally. She was also seen by vascular surgery who did not think that the patient needed any further surgical intervention. -For now we will continue supportive care and dressing changes. She is off all antibiotics and she was made comfort care only due to her multiorgan issues.   #6 hyponatremia #7 history of rheumatoid arthritis #8 hypothyroidism #9 depression #10 GERD #11 urinary retention-s/p foley for comfort and will cont.   Patient was slow to improve with multiorgan issues and had failure to thrive. A palliative care consult was obtained to discuss goals of care with the patient's husband and the power of attorney. Upon those discussions they decided to make the patient comfort care only and therefore she is presently being discharged to hospice home.  DISCHARGE CONDITIONS:   Stable  CONSULTS OBTAINED:  DRUG ALLERGIES:   Allergies  Allergen Reactions  . Dabigatran Etexilate Mesylate Shortness Of Breath  . Penicillins Anaphylaxis and Other (See Comments)    Has patient had a PCN reaction causing  immediate rash, facial/tongue/throat swelling, SOB or lightheadedness with hypotension: Yes Has patient had a PCN reaction causing severe rash involving mucus membranes or skin necrosis: No Has patient had a PCN reaction that required hospitalization No Has patient had a PCN reaction occurring within the last 10 years: Yes If all of the above answers are "NO", then may proceed with Cephalosporin use.  Marland Kitchen Coumadin [Warfarin Sodium] Other (See Comments)    Reaction:  Hematoma   . Doxycycline Hyclate [Doxycycline] Itching  . Sulfonamide Derivatives Other (See Comments)    Reaction:  Unknown   . Tape Other (See Comments)    Reaction:  Blisters   . Tramadol Hcl Itching    DISCHARGE MEDICATIONS:   Current Discharge Medication List    START taking these medications   Details  acetaminophen (TYLENOL) 325 MG tablet Take 2 tablets (650 mg total) by mouth every 4 (four) hours as needed for mild pain (or Fever >/= 101). Qty: 15 tablet, Refills: 0    bisacodyl (DULCOLAX) 10 MG suppository Place 1 suppository (10 mg total) rectally at bedtime. Qty: 6 suppository, Refills: 0    glycopyrrolate (ROBINUL) 1 MG tablet Take 1 tablet (1 mg total) by mouth 3 (three) times daily as needed (excessive secretions). Qty: 15 tablet, Refills: 0    LORazepam (ATIVAN) 0.5 MG tablet Take 1 tablet (0.5 mg total) by mouth every 4 (four) hours as needed for anxiety. Qty: 15 tablet, Refills: 0    Morphine Sulfate (MORPHINE CONCENTRATE) 10 MG/0.5ML SOLN concentrated solution Take 0.25 mLs (5 mg total) by mouth every 2 (two) hours as needed for moderate pain, severe pain or shortness of breath. Qty: 30 mL, Refills: 0    prochlorperazine (COMPAZINE) 25 MG suppository Place 1 suppository (25 mg total) rectally every 8 (eight) hours as needed for nausea or vomiting. Qty: 6 suppository, Refills: 0    senna (SENOKOT) 8.6 MG TABS tablet Take 1 tablet (8.6 mg total) by mouth 2 (two) times daily. Qty: 120 each, Refills: 0       CONTINUE these medications which have CHANGED   Details  feeding supplement, ENSURE ENLIVE, (ENSURE ENLIVE) LIQD Take 237 mLs by mouth 3 (three) times daily with meals. Qty: 237 mL, Refills: 0      STOP taking these medications     ALPRAZolam (XANAX) 0.25 MG tablet      apixaban (ELIQUIS) 2.5 MG TABS tablet      atenolol (TENORMIN) 25 MG tablet      Bismuth Subsalicylate (KAOPECTATE PO)      DULoxetine (CYMBALTA) 30 MG capsule      ferrous sulfate 325 (65 FE) MG tablet      folic acid (FOLVITE) 1 MG tablet      furosemide (LASIX) 40 MG tablet      hydroxychloroquine (PLAQUENIL) 200 MG tablet      levothyroxine (SYNTHROID, LEVOTHROID) 75 MCG tablet      magnesium hydroxide (MILK OF MAGNESIA) 400 MG/5ML suspension      methotrexate (RHEUMATREX) 2.5 MG tablet      Multiple Vitamin (MULTIVITAMIN WITH MINERALS) TABS tablet      omeprazole (PRILOSEC) 40 MG capsule      ondansetron (ZOFRAN) 4 MG tablet      oxyCODONE (OXYCONTIN) 20 mg 12 hr tablet  oxyCODONE (ROXICODONE) 15 MG immediate release tablet      polyethylene glycol (MIRALAX / GLYCOLAX) packet      vitamin B-12 (CYANOCOBALAMIN) 1000 MCG tablet      acyclovir ointment (ZOVIRAX) 5 %      ciprofloxacin (CIPRO) 500 MG tablet      doxycycline (VIBRAMYCIN) 100 MG capsule      nystatin (MYCOSTATIN) 100000 UNIT/ML suspension          DISCHARGE INSTRUCTIONS:   DIET:  Dysphagia 3 with thin liquids and aspiration precautions.  DISCHARGE CONDITION:  Stable  ACTIVITY:  Activity as tolerated  OXYGEN:  Home Oxygen: No.   Oxygen Delivery: room air  DISCHARGE LOCATION:  Hospice home   If you experience worsening of your admission symptoms, develop shortness of breath, life threatening emergency, suicidal or homicidal thoughts you must seek medical attention immediately by calling 911 or calling your MD immediately  if symptoms less severe.  You Must read complete instructions/literature  along with all the possible adverse reactions/side effects for all the Medicines you take and that have been prescribed to you. Take any new Medicines after you have completely understood and accpet all the possible adverse reactions/side effects.   Please note  You were cared for by a hospitalist during your hospital stay. If you have any questions about your discharge medications or the care you received while you were in the hospital after you are discharged, you can call the unit and asked to speak with the hospitalist on call if the hospitalist that took care of you is not available. Once you are discharged, your primary care physician will handle any further medical issues. Please note that NO REFILLS for any discharge medications will be authorized once you are discharged, as it is imperative that you return to your primary care physician (or establish a relationship with a primary care physician if you do not have one) for your aftercare needs so that they can reassess your need for medications and monitor your lab values.     Today   More awake today, eating breakfast. No complaints, no shortness of breath.  VITAL SIGNS:  Blood pressure 130/106, pulse 67, temperature 98 F (36.7 C), temperature source Oral, resp. rate 20, height 5\' 5"  (1.651 m), weight 77.565 kg (171 lb), SpO2 96 %.  I/O:   Intake/Output Summary (Last 24 hours) at 06/28/15 1220 Last data filed at 06/27/15 1621  Gross per 24 hour  Intake      0 ml  Output    300 ml  Net   -300 ml    PHYSICAL EXAMINATION:   GENERAL: 81 y.o.-year-old cahchectic patient lying in the bed in NAD EYES: Pupils equal, round, reactive to light and accommodation. No scleral icterus. Extraocular muscles intact.  HEENT: Head atraumatic, normocephalic. Oropharynx and nasopharynx clear. Moist Oral Mucosa.  NECK: Supple, no jugular venous distention. No thyroid enlargement, no tenderness.  LUNGS:Good air entry bilaterally. No rales,  rhonchi, wheezing. No use of accessory muscles of respiration.  CARDIOVASCULAR: S1, S2 normal. No murmurs, rubs, or gallops.  ABDOMEN: Soft, nontender, nondistended. Bowel sounds present. No organomegaly or mass.  EXTREMITIES: No cyanosis, clubbing, +1-2 edema b/l (dependent edema). Bilateral lower extremities wrapped in Kerlix due to lower extremity ulcers. NEUROLOGIC: Cranial nerves II through XII are intact. No focal Motor or sensory deficits b/l. Globally weak PSYCHIATRIC: The patient is alert and oriented x 2.  SKIN: No obvious rash, lesion, + LE ulcers and + Stage 1 sacral ulcer.  DATA REVIEW:   CBC  Recent Labs Lab 06/25/15 0542 06/26/15 0627  WBC 9.4  --   HGB 8.5* 8.4*  HCT 27.1*  --   PLT 304  --     Chemistries   Recent Labs Lab 06/26/15 0627  NA 131*  K 4.3  CL 105  CO2 20*  GLUCOSE 101*  BUN 66*  CREATININE 3.02*  CALCIUM 8.2*    Cardiac Enzymes No results for input(s): TROPONINI in the last 168 hours.  Microbiology Results  Results for orders placed or performed during the hospital encounter of 06/18/15  Blood culture (routine x 2)     Status: None   Collection Time: 06/18/15  2:38 PM  Result Value Ref Range Status   Specimen Description BLOOD LEFT FATTY CASTS  Final   Special Requests BOTTLES DRAWN AEROBIC AND ANAEROBIC 1CC  Final   Culture NO GROWTH 5 DAYS  Final   Report Status 06/23/2015 FINAL  Final  Blood culture (routine x 2)     Status: None   Collection Time: 06/18/15  2:38 PM  Result Value Ref Range Status   Specimen Description BLOOD LEFT ARM  Final   Special Requests   Final    BOTTLES DRAWN AEROBIC AND ANAEROBIC 1CCAERO,2CCANAERO   Culture NO GROWTH 5 DAYS  Final   Report Status 06/23/2015 FINAL  Final  Urine culture     Status: None   Collection Time: 06/18/15  2:38 PM  Result Value Ref Range Status   Specimen Description URINE, RANDOM  Final   Special Requests NONE  Final   Culture NO GROWTH 2 DAYS  Final   Report  Status 06/20/2015 FINAL  Final  Wound culture     Status: None   Collection Time: 06/18/15  4:05 PM  Result Value Ref Range Status   Specimen Description WOUND  Final   Special Requests Normal  Final   Gram Stain FEW WBC SEEN RARE GRAM NEGATIVE RODS   Final   Culture   Final    RARE GROWTH METHICILLIN RESISTANT STAPHYLOCOCCUS AUREUS   Report Status 06/24/2015 FINAL  Final   Organism ID, Bacteria METHICILLIN RESISTANT STAPHYLOCOCCUS AUREUS  Final      Susceptibility   Methicillin resistant staphylococcus aureus - MIC*    CIPROFLOXACIN >=8 RESISTANT Resistant     ERYTHROMYCIN >=8 RESISTANT Resistant     GENTAMICIN <=0.5 SENSITIVE Sensitive     OXACILLIN Value in next row Resistant      >=4 MODERATELY RESISTANT    VANCOMYCIN 1 SENSITIVE Sensitive     TRIMETH/SULFA <=10 SENSITIVE Sensitive     CLINDAMYCIN <=0.25 SENSITIVE Sensitive     CEFOXITIN SCREEN Value in next row Resistant      POSITIVECEFOXITIN SCREEN - This test may be used to predict mecA-mediated oxacillin resistance, and it is based on the cefoxitin disk screen test.  The cefoxitin screen and oxacillin work in combination to determine the final interpretation reported for oxacillin.     Inducible Clindamycin Value in next row Sensitive      POSITIVECEFOXITIN SCREEN - This test may be used to predict mecA-mediated oxacillin resistance, and it is based on the cefoxitin disk screen test.  The cefoxitin screen and oxacillin work in combination to determine the final interpretation reported for oxacillin.     TETRACYCLINE Value in next row Sensitive      SENSITIVE<=1    * RARE GROWTH METHICILLIN RESISTANT STAPHYLOCOCCUS AUREUS  MRSA PCR Screening     Status:  None   Collection Time: 06/19/15 10:25 AM  Result Value Ref Range Status   MRSA by PCR NEGATIVE NEGATIVE Final    Comment:        The GeneXpert MRSA Assay (FDA approved for NASAL specimens only), is one component of a comprehensive MRSA colonization surveillance program.  It is not intended to diagnose MRSA infection nor to guide or monitor treatment for MRSA infections.     RADIOLOGY:  No results found.    Management plans discussed with the patient, family and they are in agreement.  CODE STATUS:     Code Status Orders        Start     Ordered   06/19/15 1119  Do not attempt resuscitation (DNR)   Continuous    Question Answer Comment  In the event of cardiac or respiratory ARREST Do not call a "code blue"   In the event of cardiac or respiratory ARREST Do not perform Intubation, CPR, defibrillation or ACLS   In the event of cardiac or respiratory ARREST Use medication by any route, position, wound care, and other measures to relive pain and suffering. May use oxygen, suction and manual treatment of airway obstruction as needed for comfort.   Comments nurse may pronounce      06/19/15 1118    Advance Directive Documentation        Most Recent Value   Type of Advance Directive  Out of facility DNR (pink MOST or yellow form)   Pre-existing out of facility DNR order (yellow form or pink MOST form)     "MOST" Form in Place?        TOTAL TIME TAKING CARE OF THIS PATIENT: 40 minutes.    Henreitta Leber M.D on 06/28/2015 at 12:20 PM  Between 7am to 6pm - Pager - [phone]  After 6pm go to [email or web address] - password [email or web address] [facility]  Office  [phone]  CC: Primary care physician; Viviana Simpler, MD

## 2015-06-28 NOTE — Clinical Social Work Note (Signed)
CSW notified that patient has a hospice home bed for today and Lauren at [facility] has requested we get the husband to complete admission paperwork for hospice home and then fax it back to her. CSW has received the paperwork via fax and contacted patient's husband via phone who stated that he will be up to the hospital about 12:30 to complete the paperwork. CSW has notified charge nurse on 1C. Nurse to call report to Lauren at [facility]. Shela Leff MSW,LCSW [phone]

## 2015-06-28 NOTE — Progress Notes (Signed)
EMS called for non-emergent transport. Madlyn Frankel, RN

## 2015-06-28 NOTE — Progress Notes (Signed)
Report called to [facility] at [facility]. Madlyn Frankel, RN

## 2015-07-03 ENCOUNTER — Ambulatory Visit: Payer: Medicare Other | Admitting: Cardiovascular Disease

## 2015-07-09 ENCOUNTER — Encounter: Payer: Self-pay | Admitting: Podiatry

## 2015-07-09 ENCOUNTER — Encounter: Payer: Medicare Other | Admitting: Podiatry

## 2015-07-09 NOTE — Progress Notes (Signed)
This encounter was created in error - please disregard.

## 2015-07-14 ENCOUNTER — Telehealth: Payer: Self-pay | Admitting: *Deleted

## 2015-07-14 NOTE — Telephone Encounter (Signed)
PLEASE NOTE: All timestamps contained within this report are represented as [place] Standard Time. CONFIDENTIALTY NOTICE: This fax transmission is intended only for the addressee. It contains information that is legally privileged, confidential or otherwise protected from use or disclosure. If you are not the intended recipient, you are strictly prohibited from reviewing, disclosing, copying using or disseminating any of this information or taking any action in reliance on or regarding this information. If you have received this fax in error, please notify us immediately by telephone so that we can arrange for its return to [place]. Phone: [phone], Toll-Free: [phone], Fax: [phone] Page: 1 of 1 Call Id: [id number] [place] Patient Name: [name] Gender: Female DOB: [birth year] Age: 82 Y 53 M 29 D Return Phone Number: Address: City/State/Zip: [place] Client [place] Night - Client Client Site [place] Physician Viviana Simpler Contact Type Call Caller Name Marzella Schlein Phone Number [phone] Relationship To Patient Care Giver Is this call to report lab results? No Call Type General Information Initial Comment Caller states she is Dub Mikes with [facility] in [place] @ [phone] reporting the death of a patient. General Information Type Message Only Nurse Assessment Guidelines Guideline Title Affirmed Question Affirmed Notes Nurse Date/Time (Eastern Time) Disp. Time Eilene Ghazi Time) Disposition Final User 08/05/2015 5:38:41 AM General Information Provided Yes Gae Gallop After Care Instructions Given Call Event Type User Date / Time Description Comments User: Gae Gallop Date/Time Eilene Ghazi Time): 08-05-15 5:39:29 AM Caller states she did not want the on call paged or a call back.

## 2015-07-14 NOTE — Telephone Encounter (Signed)
Yes, I had heard

## 2015-08-06 DEATH — deceased

## 2017-04-18 IMAGING — CR DG FOOT COMPLETE 3+V*L*
3 series · 3 of 3 positions shown · non-contrast
Comparison: None.

CLINICAL DATA: Edema and ulcers to bilateral legs

EXAM:
LEFT FOOT - COMPLETE 3+ VIEW

[foot ap]
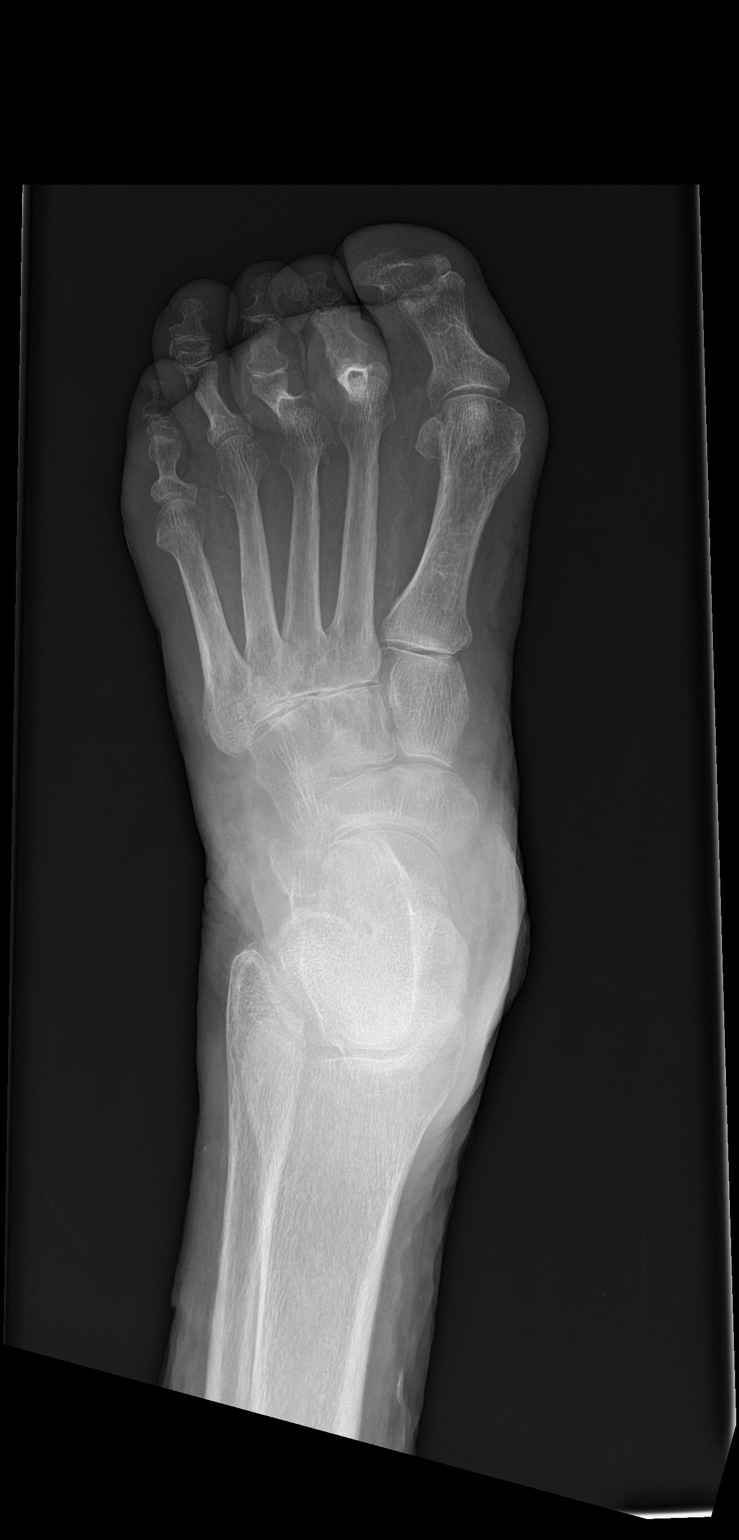

[foot obl]
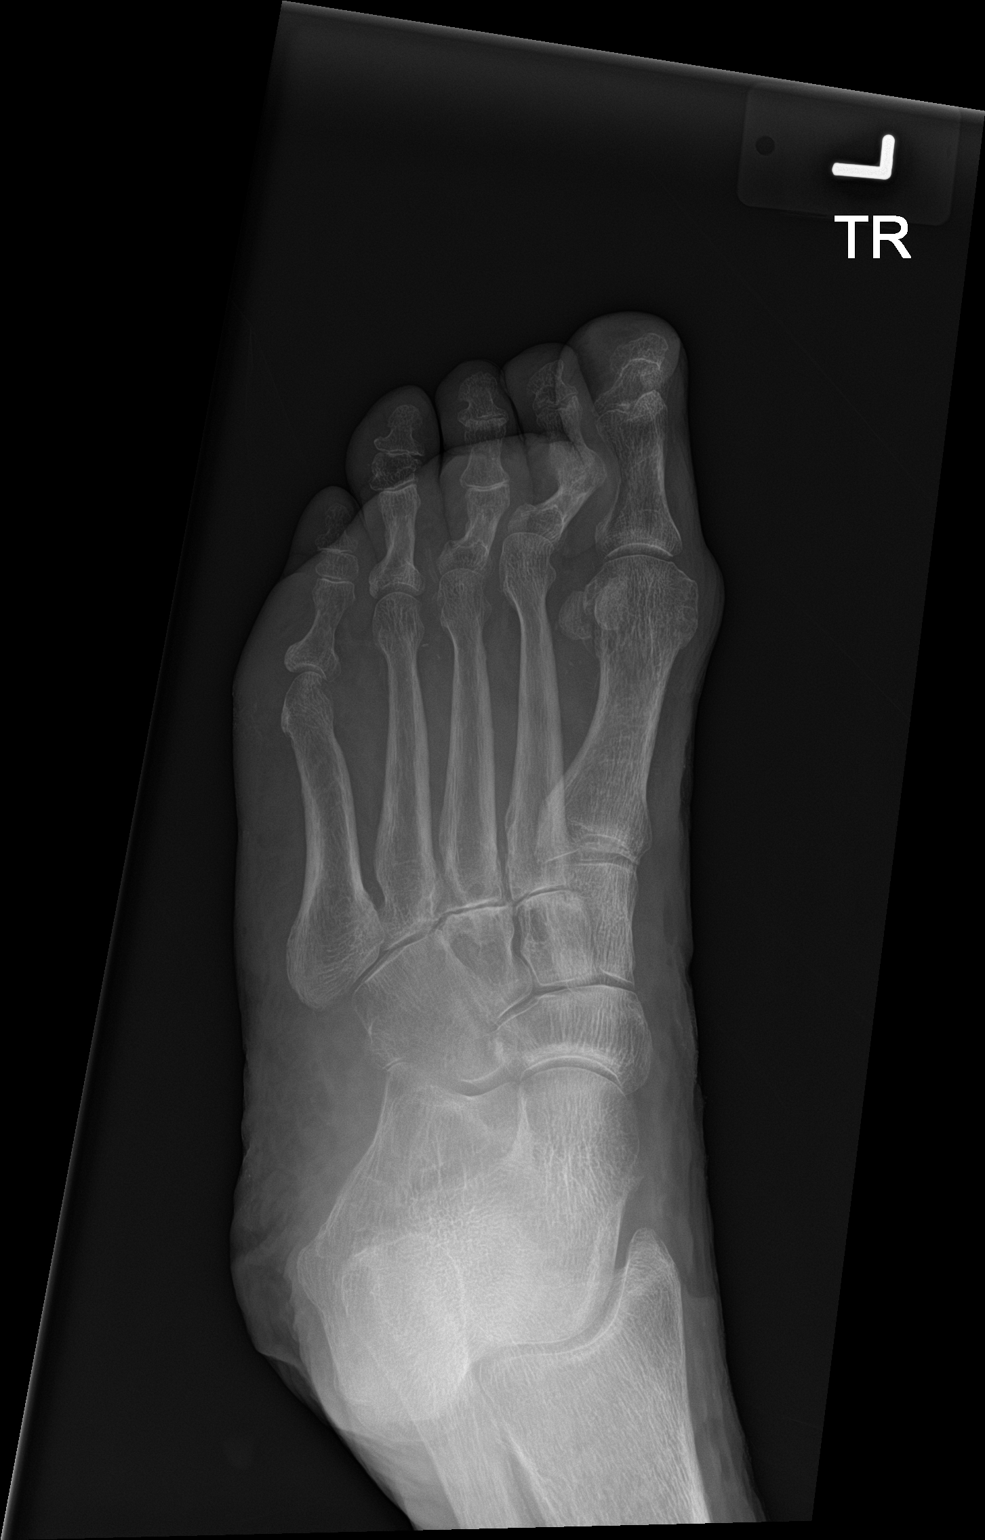

[foot lat]
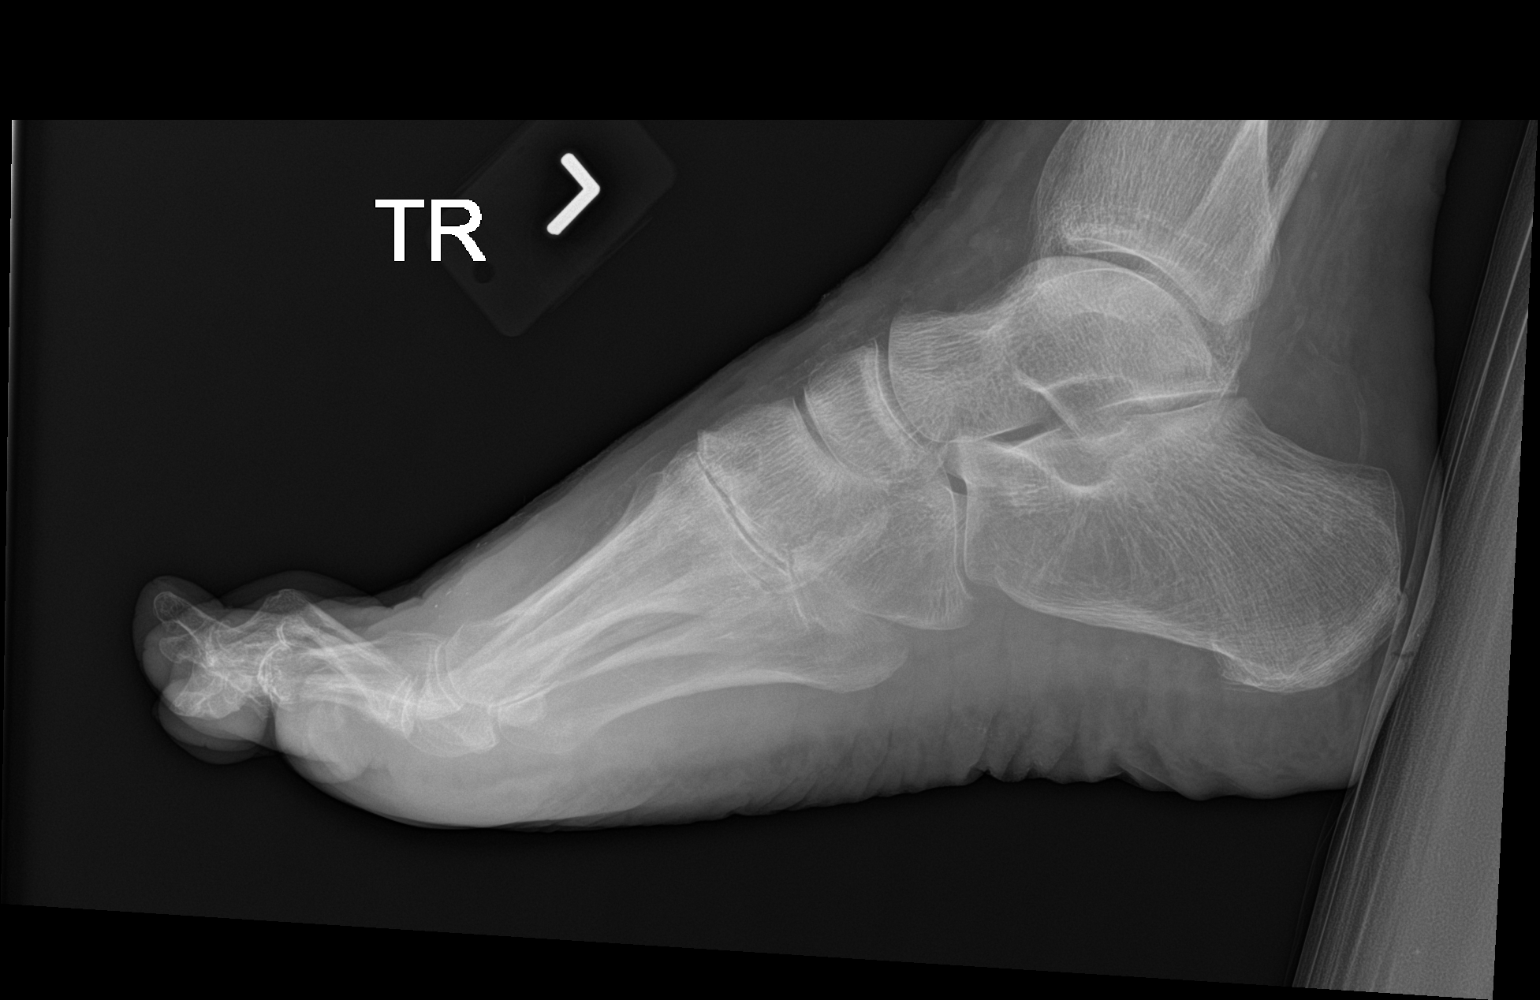

[3 of 3 positions shown; findings below may reference images not displayed]

FINDINGS: No fracture or dislocation is seen.

The joint spaces are preserved.

Mild soft tissue swelling along the dorsal forefoot.

No radiographic findings of acute osteomyelitis.
IMPRESSION: Mild soft tissue swelling along the dorsal forefoot.

No radiographic findings of acute osteomyelitis.

## 2017-04-18 IMAGING — CR DG FOOT COMPLETE 3+V*R*
3 series · 3 of 3 positions shown · non-contrast
Comparison: None.

CLINICAL DATA: Lower extremity ulceration and infection.

EXAM:
RIGHT FOOT COMPLETE - 3+ VIEW

[foot ap]
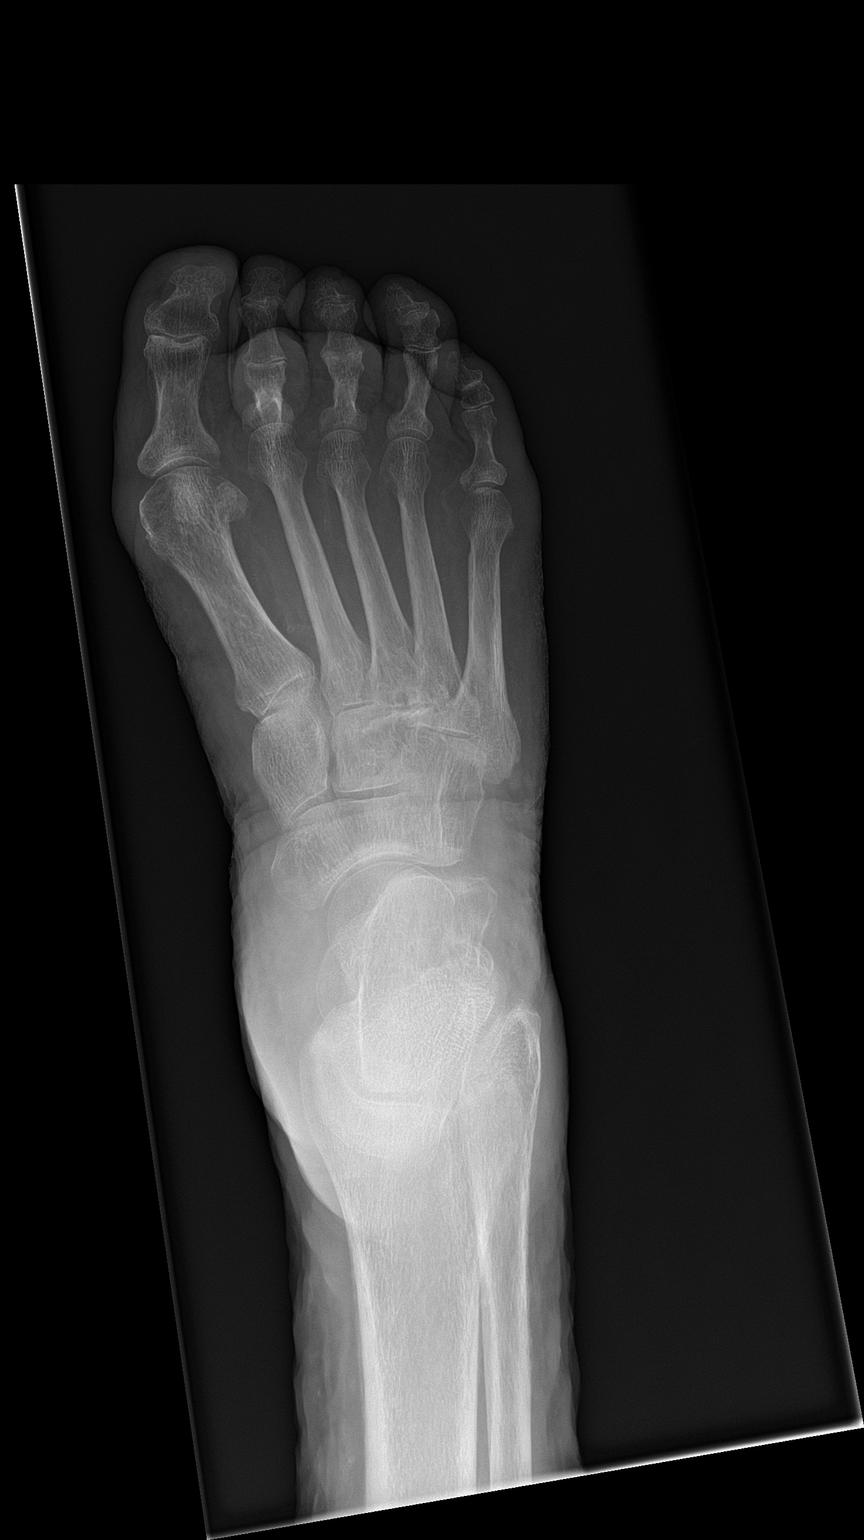

[foot obl]
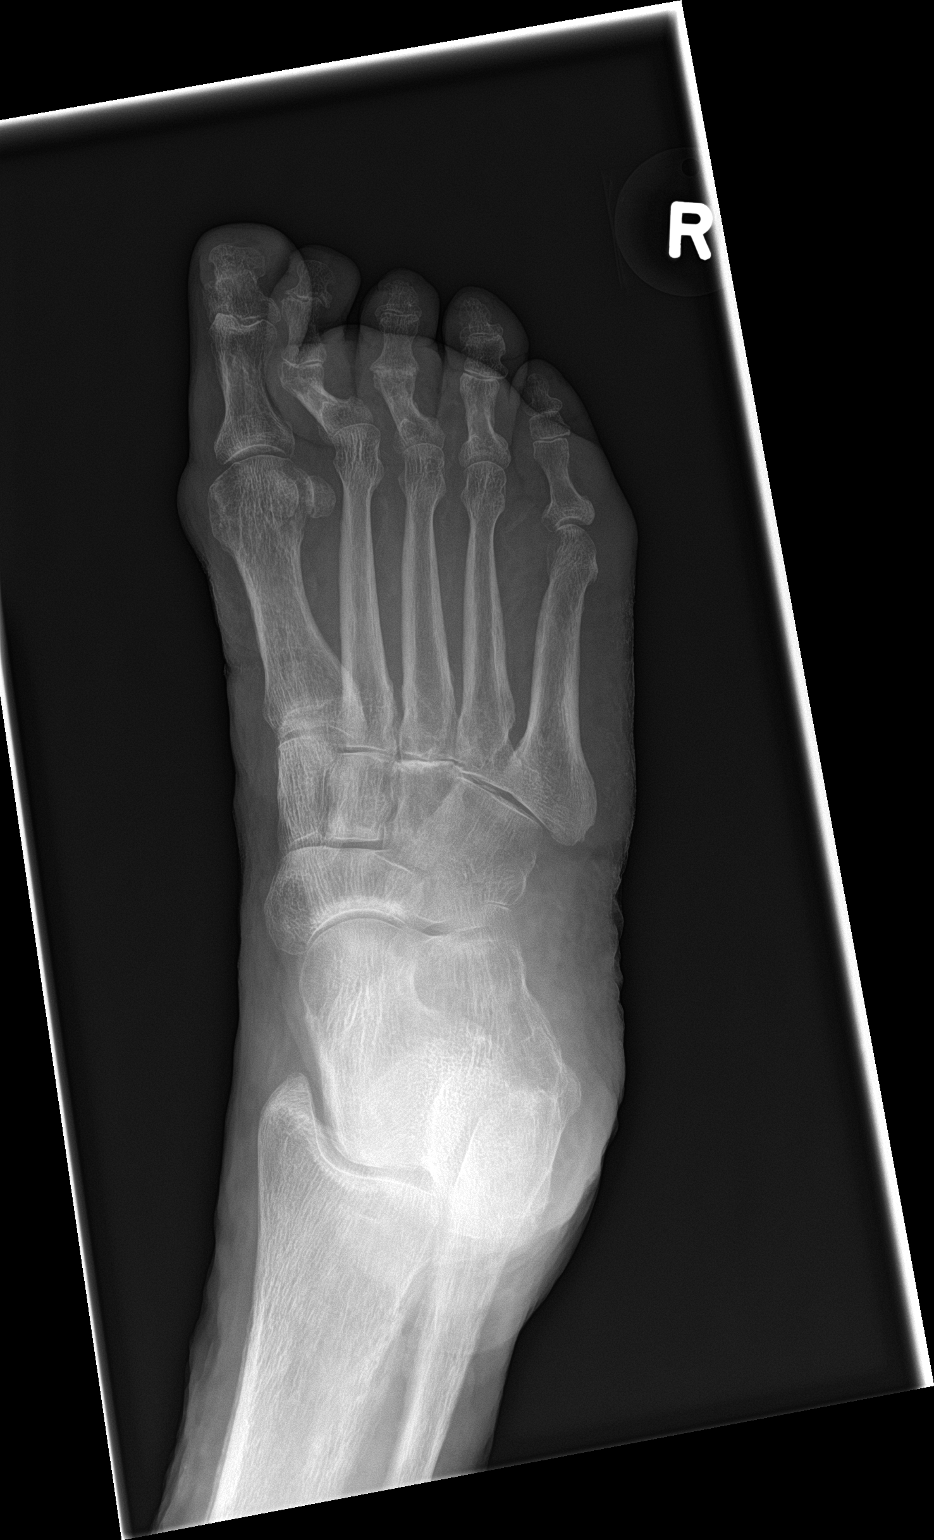

[foot lat]
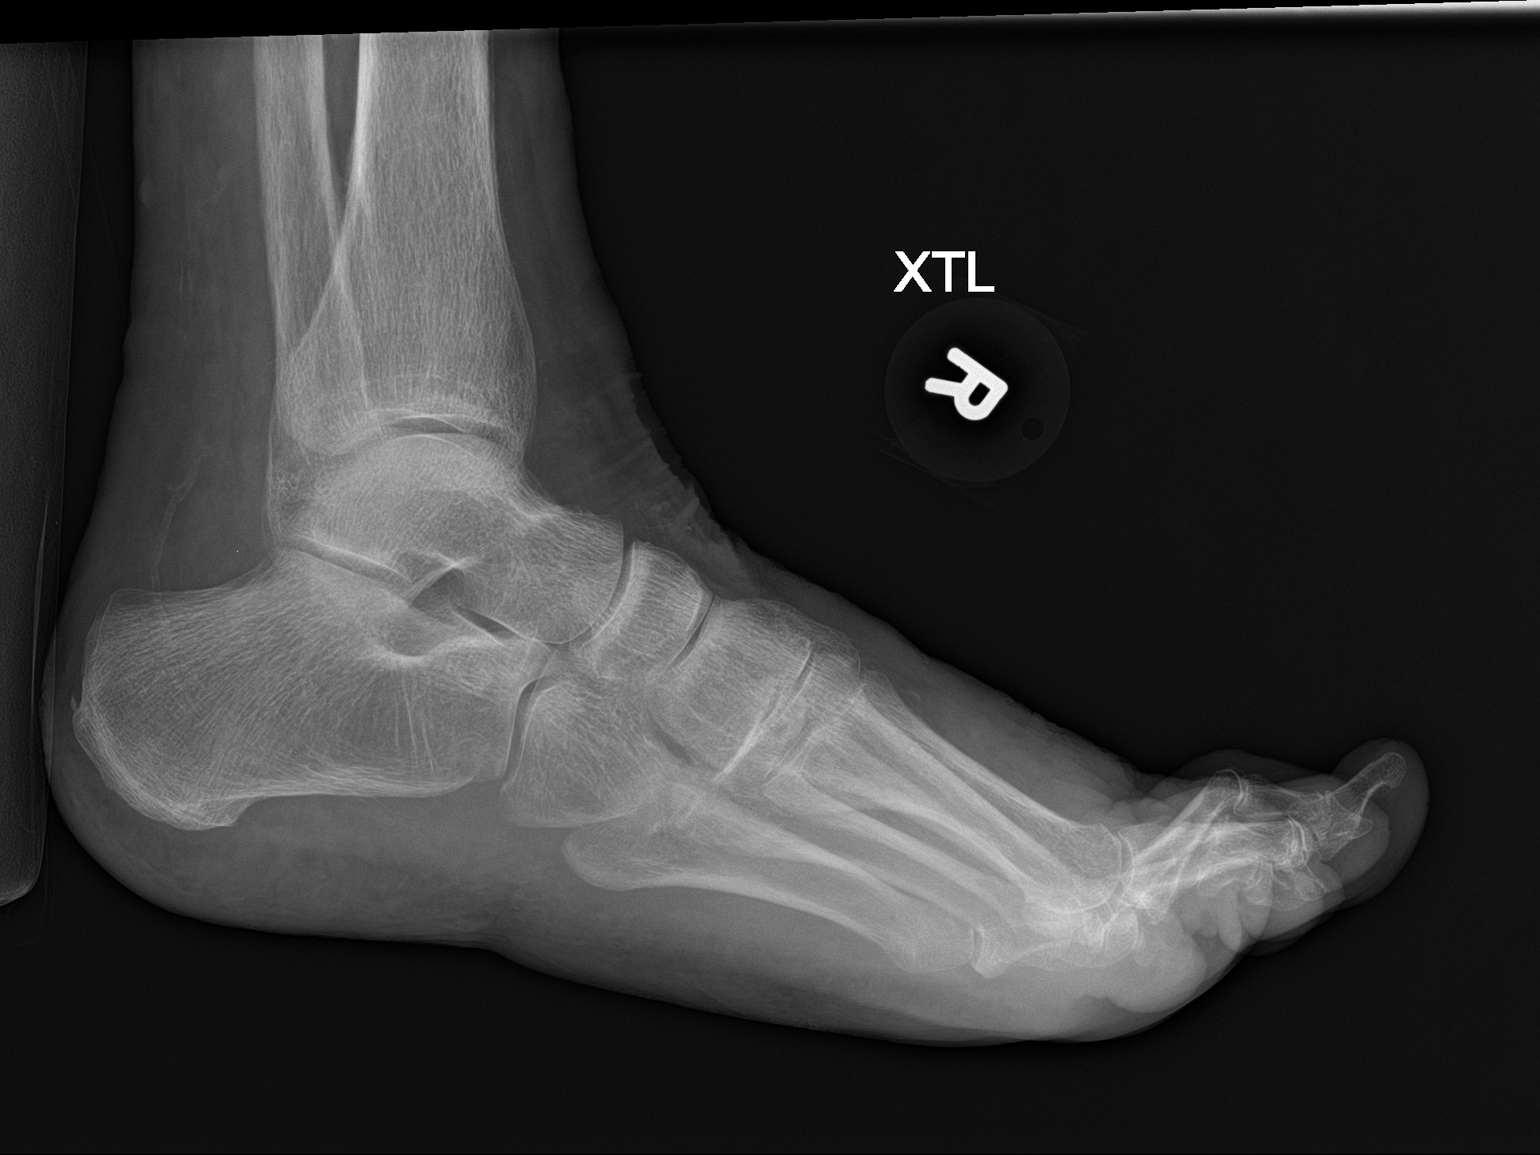

[3 of 3 positions shown; findings below may reference images not displayed]

FINDINGS: Moderate osteopenia is present. Ulcerations are noted along the
plantar surface of the foot near the MTP joints. There is no
underlying bone erosion or fracture. The joint is located. Mild
diffuse edema is present.
IMPRESSION: 1. Ulcerations along the plantar surface the foot without underlying
osseous change.
2. Moderate osteopenia.
